# Patient Record
Sex: Female | Born: 1945 | Race: White | Hispanic: No | Smoking: Current every day smoker
Health system: Southern US, Community
[De-identification: ages and names within clinical notes are randomized; demographics above are authoritative.]

## PROBLEM LIST (undated history)

## (undated) DIAGNOSIS — R7303 Prediabetes: Secondary | ICD-10-CM

## (undated) DIAGNOSIS — I10 Essential (primary) hypertension: Secondary | ICD-10-CM

## (undated) DIAGNOSIS — R112 Nausea with vomiting, unspecified: Secondary | ICD-10-CM

## (undated) DIAGNOSIS — M810 Age-related osteoporosis without current pathological fracture: Secondary | ICD-10-CM

## (undated) DIAGNOSIS — E785 Hyperlipidemia, unspecified: Secondary | ICD-10-CM

## (undated) DIAGNOSIS — N6019 Diffuse cystic mastopathy of unspecified breast: Secondary | ICD-10-CM

## (undated) DIAGNOSIS — M1990 Unspecified osteoarthritis, unspecified site: Secondary | ICD-10-CM

## (undated) DIAGNOSIS — Z9889 Other specified postprocedural states: Secondary | ICD-10-CM

## (undated) DIAGNOSIS — IMO0001 Reserved for inherently not codable concepts without codable children: Secondary | ICD-10-CM

## (undated) DIAGNOSIS — K219 Gastro-esophageal reflux disease without esophagitis: Secondary | ICD-10-CM

## (undated) HISTORY — PX: TONSILLECTOMY: SUR1361

## (undated) HISTORY — PX: TUBAL LIGATION: SHX77

## (undated) HISTORY — PX: BREAST CYST EXCISION: SHX579

## (undated) HISTORY — PX: ABDOMINAL HYSTERECTOMY: SHX81

## (undated) HISTORY — PX: BREAST SURGERY: SHX581

## (undated) HISTORY — PX: DIAGNOSTIC LAPAROSCOPY: SUR761

## (undated) HISTORY — PX: FOOT GANGLION EXCISION: SHX1660

---

## 2008-11-18 ENCOUNTER — Emergency Department (HOSPITAL_COMMUNITY): Admission: EM | Admit: 2008-11-18 | Discharge: 2008-11-18 | Payer: Self-pay | Admitting: Emergency Medicine

## 2009-03-27 ENCOUNTER — Ambulatory Visit (HOSPITAL_COMMUNITY): Admission: RE | Admit: 2009-03-27 | Discharge: 2009-03-27 | Payer: Self-pay | Admitting: Family Medicine

## 2009-09-01 ENCOUNTER — Emergency Department (HOSPITAL_COMMUNITY): Admission: EM | Admit: 2009-09-01 | Discharge: 2009-09-01 | Payer: Self-pay | Admitting: Emergency Medicine

## 2010-01-16 ENCOUNTER — Ambulatory Visit (HOSPITAL_COMMUNITY): Admission: RE | Admit: 2010-01-16 | Discharge: 2010-01-16 | Payer: Self-pay | Admitting: Family Medicine

## 2010-03-17 HISTORY — PX: COLONOSCOPY: SHX174

## 2010-06-02 LAB — DIFFERENTIAL
Basophils Relative: 0 % (ref 0–1)
Eosinophils Absolute: 0.3 10*3/uL (ref 0.0–0.7)
Eosinophils Relative: 3 % (ref 0–5)
Monocytes Absolute: 0.6 10*3/uL (ref 0.1–1.0)
Monocytes Relative: 6 % (ref 3–12)

## 2010-06-02 LAB — BASIC METABOLIC PANEL
CO2: 29 mEq/L (ref 19–32)
Chloride: 103 mEq/L (ref 96–112)
GFR calc Af Amer: >60 mL/min (ref >60)
Glucose, Bld: 105 mg/dL — ABNORMAL HIGH (ref 70–99)
Sodium: 139 mEq/L (ref 135–145)

## 2010-06-02 LAB — CBC
HCT: 38.6 % (ref 36.0–46.0)
Hemoglobin: 13 g/dL (ref 12.0–15.0)
MCHC: 33.8 g/dL (ref 30.0–36.0)
MCV: 80.8 fL (ref 78.0–100.0)
RBC: 4.78 MIL/uL (ref 3.87–5.11)

## 2010-06-21 LAB — BASIC METABOLIC PANEL
BUN: 18 mg/dL (ref 6–23)
CO2: 31 mEq/L (ref 19–32)
Calcium: 9.4 mg/dL (ref 8.4–10.5)
Creatinine, Ser: 0.55 mg/dL (ref 0.4–1.2)
Glucose, Bld: 104 mg/dL — ABNORMAL HIGH (ref 70–99)
Sodium: 141 mEq/L (ref 135–145)

## 2010-06-21 LAB — STREP A DNA PROBE: Group A Strep Probe: NEGATIVE

## 2010-06-27 ENCOUNTER — Telehealth: Payer: Self-pay

## 2010-06-27 NOTE — Telephone Encounter (Signed)
Gastroenterology Pre-Procedure Form  Request Date: 06/18/2010    Requesting Physician: Sudie Bailey     PATIENT INFORMATION:  Leah Lynch is a 65 y.o., female (DOB=28-Jan-1946).  PROCEDURE: Procedure(s) requested: colonoscopy Procedure Reason: screening for colon cancer  PATIENT REVIEW QUESTIONS: The patient reports the following:   1. Diabetes Melitis: no 2. Joint replacements in the past 12 months: no 3. Major health problems in the past 3 months: no 4. Has an artificial valve or MVP:no 5. Has been advised in past to take antibiotics in advance of a procedure like teeth cleaning: no}    MEDICATIONS & ALLERGIES:    Patient reports the following regarding taking any blood thinners:   Plavix? no Aspirin?no Coumadin?  no  Patient confirms/reports the following medications:  Current Outpatient Prescriptions  Medication Sig Dispense Refill  . atenolol-chlorthalidone (TENORETIC) 50-25 MG per tablet Take 1 tablet by mouth daily.        [name] ibuprofen (ADVIL,MOTRIN) 800 MG tablet Take 800 mg by mouth 1 dose over 46 hours.        . potassium & sodium phosphates (PHOS-NAK) 280-160-250 MG PACK Take 1 packet by mouth 4 (four) times daily -  with meals and at bedtime. Pt says she takes potassium gluconate 550 daily.       . pravastatin (PRAVACHOL) 20 MG tablet Take 20 mg by mouth daily.        [name] pyridOXINE (VITAMIN B-6) 100 MG tablet Take 100 mg by mouth daily.          Patient confirms/reports the following allergies:  Allergies  Allergen Reactions  . Penicillins Hives and Itching    Patient is appropriate to schedule for requested procedure(s): yes  AUTHORIZATION INFORMATION Primary Insurance: Medicare  ID #: [id number] Pre-Cert / Berkley Harvey required: Pre-Cert / Auth #  Secondary Insurance: Medicaid   ID #: [id number]  Pre-Cert / Berkley Harvey required Pre-Cert / Auth   Orders Placed This Encounter  Procedures  . Endoscopy, colon, diagnostic    Standing Status: Future     Number of  Occurrences:      Standing Expiration Date: 06/27/2011    Order Specific Question:  Pre-op diagnosis    Answer:  Screening colonoscopy    Order Specific Question:  Pre-op visit required?    Answer:  No [0]    SCHEDULE INFORMATION: Procedure has been scheduled as follows:  Date: 07/17/2010   Time: 7:30 AM  Location: [facility] Short Stay  This Gastroenterology Pre-Precedure Form is being routed to the following provider(s) for review:  Lorenza Burton, NP

## 2010-06-28 NOTE — Telephone Encounter (Signed)
Letter has been sent

## 2010-06-28 NOTE — Telephone Encounter (Signed)
Proceed w/ colonoscopy

## 2010-07-17 ENCOUNTER — Other Ambulatory Visit: Payer: Self-pay | Admitting: Internal Medicine

## 2010-07-17 ENCOUNTER — Ambulatory Visit (HOSPITAL_COMMUNITY)
Admission: RE | Admit: 2010-07-17 | Discharge: 2010-07-17 | Disposition: A | Discharge status: Home / Self Care | Payer: Medicare Other | Source: Ambulatory Visit | Attending: Internal Medicine | Admitting: Internal Medicine

## 2010-07-17 ENCOUNTER — Encounter: Payer: Self-pay | Admitting: Internal Medicine

## 2010-07-17 DIAGNOSIS — Z79899 Other long term (current) drug therapy: Secondary | ICD-10-CM | POA: Insufficient documentation

## 2010-07-17 DIAGNOSIS — K648 Other hemorrhoids: Secondary | ICD-10-CM

## 2010-07-17 DIAGNOSIS — Z1211 Encounter for screening for malignant neoplasm of colon: Secondary | ICD-10-CM | POA: Insufficient documentation

## 2010-07-17 DIAGNOSIS — D129 Benign neoplasm of anus and anal canal: Secondary | ICD-10-CM | POA: Insufficient documentation

## 2010-07-17 DIAGNOSIS — D126 Benign neoplasm of colon, unspecified: Secondary | ICD-10-CM | POA: Insufficient documentation

## 2010-07-17 DIAGNOSIS — D128 Benign neoplasm of rectum: Secondary | ICD-10-CM

## 2010-07-17 DIAGNOSIS — I10 Essential (primary) hypertension: Secondary | ICD-10-CM | POA: Insufficient documentation

## 2010-07-18 NOTE — Op Note (Signed)
  NAME:  Leah Lynch, Leah Lynch           ACCOUNT NO.:  [id number]  MEDICAL RECORD NO.:  [id number]           PATIENT TYPE:  O  LOCATION:  [facility]                          FACILITY:  [facility]  PHYSICIAN:  R. Roetta Sessions, M.D. DATE OF BIRTH:  09/14/1945  DATE OF PROCEDURE:  07/17/2010 DATE OF DISCHARGE:                              OPERATIVE REPORT   PROCEDURE:  Colonoscopy with biopsy.  INDICATIONS FOR PROCEDURE:  A 65 year old lady, sent over at courtesy of Dr. John Giovanni, for her first ever colonoscopy.  She has no lower GI tract symptoms.  There is no family history of polyps or colon cancer.  Colonoscopy is now being done as standard screening maneuver. Risks, benefits, limitations, alternatives, imponderables have been discussed, questions answered.  Please see the documentation in the medical record.  PROCEDURE NOTE:  O2 saturation, blood pressure, pulse, respirations were monitored throughout the entire procedure.  CONSCIOUS SEDATION:  Versed 3 mg IV, Demerol 50 mg IV in divided doses.  INSTRUMENT:  Pentax video chip system.  FINDINGS:  Digital rectal exam revealed no abnormalities.  Endoscopic findings:  Prep was adequate.  Colon:  Colonic mucosa was surveyed from the rectosigmoid junction through the left transverse right colon to the appendiceal orifice, ileocecal valve/cecum.  These structures were well seen and photographed for the record.  From this level, scope was slowly and cautiously withdrawn.  All previously mentioned mucosal surfaces were again seen.  The patient was noted have a couple of diminutive polyps in the mid descending colon which were cold biopsied/removed. Remainder of colonic mucosa appeared normal.  Scope was pulled down to the rectum where a thorough examination of rectal mucosa including retroflexed view of the anal verge demonstrated a couple of diminutive hyperplastic-appearing polyps at 10 cm of anal verge which were also cold biopsied and  removed.  There were also internal hemorrhoids, but no other abnormalities.  The patient tolerated the procedure well.  Cecal withdrawal time 10 minutes.  IMPRESSION: 1. Internal hemorrhoids. 2. Diminutive rectal polyp status post cold biopsy removal. 3. Descending colon polyp status post cold biopsy removal.  Remainder     of colonic mucosa appeared normal.  RECOMMENDATIONS: 1. Follow up on path. 2. Further recommendations to follow.     Jonathon Bellows, M.D.     RMR/MEDQ  D:  07/17/2010  T:  07/17/2010  Job:  [id number]  cc:   Mila Homer. Sudie Bailey, M.D. Fax: [phone]  Electronically Signed by Lorrin Goodell M.D. on 07/18/2010 03:49:00 PM

## 2010-07-19 ENCOUNTER — Encounter: Payer: Self-pay | Admitting: Internal Medicine

## 2010-11-11 ENCOUNTER — Emergency Department (HOSPITAL_COMMUNITY): Payer: Medicare Other

## 2010-11-11 ENCOUNTER — Encounter: Payer: Self-pay | Admitting: Emergency Medicine

## 2010-11-11 ENCOUNTER — Emergency Department (HOSPITAL_COMMUNITY)
Admission: EM | Admit: 2010-11-11 | Discharge: 2010-11-11 | Disposition: A | Discharge status: Home / Self Care | Payer: Medicare Other | Attending: Emergency Medicine | Admitting: Emergency Medicine

## 2010-11-11 DIAGNOSIS — E785 Hyperlipidemia, unspecified: Secondary | ICD-10-CM | POA: Insufficient documentation

## 2010-11-11 DIAGNOSIS — I10 Essential (primary) hypertension: Secondary | ICD-10-CM | POA: Insufficient documentation

## 2010-11-11 DIAGNOSIS — R062 Wheezing: Secondary | ICD-10-CM | POA: Insufficient documentation

## 2010-11-11 DIAGNOSIS — J069 Acute upper respiratory infection, unspecified: Secondary | ICD-10-CM | POA: Insufficient documentation

## 2010-11-11 DIAGNOSIS — Z79899 Other long term (current) drug therapy: Secondary | ICD-10-CM | POA: Insufficient documentation

## 2010-11-11 DIAGNOSIS — R509 Fever, unspecified: Secondary | ICD-10-CM | POA: Insufficient documentation

## 2010-11-11 DIAGNOSIS — F17200 Nicotine dependence, unspecified, uncomplicated: Secondary | ICD-10-CM | POA: Insufficient documentation

## 2010-11-11 HISTORY — DX: Hyperlipidemia, unspecified: E78.5

## 2010-11-11 HISTORY — DX: Essential (primary) hypertension: I10

## 2010-11-11 MED ORDER — ALBUTEROL SULFATE HFA 108 (90 BASE) MCG/ACT IN AERS: 1.0000 | INHALATION_SPRAY | Freq: Four times a day (QID) | RESPIRATORY_TRACT | Status: DC | PRN

## 2010-11-11 MED ORDER — ALBUTEROL SULFATE (5 MG/ML) 0.5% IN NEBU
2.5000 mg | INHALATION_SOLUTION | Freq: Once | RESPIRATORY_TRACT | Status: AC
  Administered 2010-11-11: 2.5 mg via RESPIRATORY_TRACT
  Filled 2010-11-11: qty 0.5

## 2010-11-11 MED ORDER — IPRATROPIUM BROMIDE 0.02 % IN SOLN
0.5000 mg | Freq: Once | RESPIRATORY_TRACT | Status: AC
  Administered 2010-11-11: 0.5 mg via RESPIRATORY_TRACT
  Filled 2010-11-11: qty 2.5

## 2010-11-11 NOTE — ED Notes (Signed)
Pt c/o coughing, wheezing, and sob since Saturday.

## 2010-11-11 NOTE — ED Notes (Signed)
PA in with pt 

## 2010-11-11 NOTE — ED Provider Notes (Signed)
History   Chart scribed for Leah Jakes, MD by Enos Fling; the patient was seen in room [facility]09/[facility]09; this patient's care was started at 1:00 PM.    CSN: [id number] Arrival date & time: 11/11/2010 11:22 AM  Chief Complaint  Patient presents with  . Cough  . Shortness of Breath  . Wheezing   HPI TEMPESTT SILBA is a 65 y.o. female who presents to the Emergency Department complaining of cough. Pt reports cough, sometimes productive of white sputum, with associated wheezing and subjective fever, onset 3 days ago and worsening since. Runny nose onset last night. Pt is smoker, 1 ppd. No h/o COPD or Emphysema. No chest pain, n/v, diaphoresis, ha, or abd pain.  PCP Dr. Sudie Bailey  Past Medical History  Diagnosis Date  . Hypertension   . Hyperlipidemia     History reviewed. No pertinent past surgical history.  History reviewed. No pertinent family history.  History  Substance Use Topics  . Smoking status: Current Everyday Smoker -- 1.0 packs/day    Types: Cigarettes  . Smokeless tobacco: Not on file  . Alcohol Use: No    OB History    Grav Para Term Preterm Abortions TAB SAB Ect Mult Living                 Previous Medications   ATENOLOL-CHLORTHALIDONE (TENORETIC) 50-25 MG PER TABLET    Take 1 tablet by mouth daily.     IBUPROFEN (ADVIL,MOTRIN) 800 MG TABLET    Take 800 mg by mouth 3 (three) times daily as needed. For pain   POTASSIUM & SODIUM PHOSPHATES (PHOS-NAK) 280-160-250 MG PACK    Take 1 packet by mouth 4 (four) times daily -  with meals and at bedtime. Pt says she takes potassium gluconate 550 daily.   POTASSIUM CHLORIDE (KLOR-CON) 10 MEQ CR TABLET    Take 10 mEq by mouth daily.     PRAVASTATIN (PRAVACHOL) 20 MG TABLET    Take 20 mg by mouth daily.     PRAVASTATIN (PRAVACHOL) 40 MG TABLET    Take 40 mg by mouth daily.     PYRIDOXINE (VITAMIN B-6) 100 MG TABLET    Take 100 mg by mouth daily.       Allergies as of 11/11/2010 - Review Complete 11/11/2010    Allergen Reaction Noted  . Penicillins Hives and Itching 06/27/2010  . Bee venom Swelling 11/11/2010  . Sulfa antibiotics Itching 11/11/2010      Review of Systems  Constitutional: Positive for fever. Negative for chills.  HENT: Positive for congestion and rhinorrhea.   Eyes: Negative for redness.  Respiratory: Positive for cough and wheezing. Negative for shortness of breath.   Cardiovascular: Negative for chest pain and leg swelling.  Gastrointestinal: Negative for nausea, vomiting, abdominal pain and diarrhea.  Genitourinary: Negative for flank pain.  Musculoskeletal: Negative for back pain.  Skin: Negative for rash.  Neurological: Negative for headaches.    Physical Exam  BP 111/61  Pulse 73  Temp(Src) 98.5 F (36.9 C) (Oral)  Resp 18  Ht 4\' 11"  (1.499 m)  Wt 151 lb 3 oz (68.578 kg)  BMI 30.54 kg/m2  SpO2 93%  Physical Exam  Nursing note and vitals reviewed. Constitutional: She is oriented to person, place, and time. She appears well-developed and well-nourished. No distress.  HENT:  Head: Normocephalic and atraumatic.  Mouth/Throat: Oropharynx is clear and moist.  Eyes: Conjunctivae and EOM are normal. Pupils are equal, round, and reactive to light.  Neck:  Normal range of motion. Neck supple.  Cardiovascular: Normal rate, regular rhythm and normal heart sounds.   Pulmonary/Chest: Effort normal. She has wheezes (bilateral expiratory wheezing). She exhibits no tenderness.  Abdominal: Soft. Bowel sounds are normal. There is no tenderness.  Musculoskeletal: Normal range of motion. She exhibits no edema and no tenderness.  Lymphadenopathy:    She has no cervical adenopathy.  Neurological: She is alert and oriented to person, place, and time. She has normal strength. No cranial nerve deficit or sensory deficit.  Skin: Skin is warm and dry. No rash noted.  Psychiatric: She has a normal mood and affect.    ED Course  Procedures  OTHER DATA REVIEWED: Nursing notes  and vital signs reviewed. Prior records reviewed.   LABS / RADIOLOGY:  Dg Chest 2 View  11/11/2010  *RADIOLOGY REPORT*  Clinical Data: Cough, shortness of breath  CHEST - 2 VIEW  Comparison: None  Findings: Upper-normal size of cardiac silhouette. Mediastinal contours and pulmonary vascularity normal. Atherosclerotic calcification aorta. Minimal peribronchial thickening. No pulmonary infiltrate, pleural effusion or pneumothorax. Bones appear demineralized.  IMPRESSION: Minimal bronchitic changes.  Original Report Authenticated By: Lollie Marrow, M.D.      ED COURSE: 2:22 PM - Pt recheck, feeling a little better after neb treatment; still with slight wheezing but improved. Discussed discharge, pt agrees.   MDM: REACTIVE AIRWAY WITH WHEEZING AND URI AND POSSIBLY EARLY COPD. ALL WHEEZING RESOLVED IN ED. AND FEELS BETTER. CXR NEG FOR PENUMONIA.   IMPRESSION: 1. Upper respiratory infection   2. Wheezing      PLAN: discharge All results reviewed and discussed with pt, questions answered, pt agreeable with plan.   CONDITION ON DISCHARGE: stable   MEDS GIVEN IN ED: ipratropium (ATROVENT) 0.02 % nebulizer solution 0.5 mg (0.5 mg Nebulization Given 11/11/10 1346)  albuterol (PROVENTIL) (5 MG/ML) 0.5% nebulizer solution 2.5 mg (2.5 mg Nebulization Given 11/11/10 1346)     DISCHARGE MEDICATIONS: New Prescriptions   ALBUTEROL (PROVENTIL HFA;VENTOLIN HFA) 108 (90 BASE) MCG/ACT INHALER    Inhale 1-2 puffs into the lungs every 6 (six) hours as needed for wheezing.     SCRIBE ATTESTATION: I personally performed the services described in this documentation, which was scribed in my presence. The recorded information has been reviewed and considered. Leah Jakes, MD         Leah Jakes, MD 11/11/10 [phone]

## 2010-12-12 ENCOUNTER — Encounter (HOSPITAL_COMMUNITY)
Admission: RE | Admit: 2010-12-12 | Discharge: 2010-12-12 | Disposition: A | Discharge status: Home / Self Care | Payer: PRIVATE HEALTH INSURANCE | Source: Ambulatory Visit | Attending: Podiatry | Admitting: Podiatry

## 2010-12-12 ENCOUNTER — Encounter (HOSPITAL_COMMUNITY): Payer: Self-pay

## 2010-12-12 ENCOUNTER — Other Ambulatory Visit: Payer: Self-pay

## 2010-12-12 HISTORY — DX: Diffuse cystic mastopathy of unspecified breast: N60.19

## 2010-12-12 LAB — BASIC METABOLIC PANEL
BUN: 14 mg/dL (ref 6–23)
CO2: 32 mEq/L (ref 19–32)
Calcium: 9.4 mg/dL (ref 8.4–10.5)
GFR calc non Af Amer: >60 mL/min (ref >60)
Glucose, Bld: 128 mg/dL — ABNORMAL HIGH (ref 70–99)
Potassium: 3.1 mEq/L — ABNORMAL LOW (ref 3.5–5.1)

## 2010-12-12 LAB — CBC
HCT: 41.3 % (ref 36.0–46.0)
Hemoglobin: 13.6 g/dL (ref 12.0–15.0)
MCH: 28.1 pg (ref 26.0–34.0)
MCHC: 32.9 g/dL (ref 30.0–36.0)
RBC: 4.84 MIL/uL (ref 3.87–5.11)

## 2010-12-12 LAB — SURGICAL PCR SCREEN: Staphylococcus aureus: NEGATIVE

## 2010-12-12 NOTE — Patient Instructions (Signed)
[name]  12/12/2010   Your procedure is scheduled on: 12/19/10  Report to [facility] [place] AM.  Call this number if you have problems the morning of surgery: [phone]   Remember:   Do not eat food:After Midnight.  Do not drink clear liquids: After Midnight.  Take these medicines the morning of surgery with A SIP OF WATER: tentoretic, albuterol   Do not wear jewelry, make-up or nail polish.  Do not wear lotions, powders, or perfumes. You may wear deodorant.  Do not shave 48 hours prior to surgery.  Do not bring valuables to the hospital.  Contacts, dentures or bridgework may not be worn into surgery.  Leave suitcase in the car. After surgery it may be brought to your room.  For patients admitted to the hospital, checkout time is 11:00 AM the day of discharge.   Patients discharged the day of surgery will not be allowed to drive home.  Name and phone number of your driver: family  Special Instructions: CHG Shower Use Special Wash: 1/2 bottle night before surgery and 1/2 bottle morning of surgery.   Please read over the following fact sheets that you were given: Pain Booklet, MRSA Information, Surgical Site Infection Prevention, Anesthesia Post-op Instructions and Care and Recovery After Surgery   PATIENT INSTRUCTIONS POST-ANESTHESIA  IMMEDIATELY FOLLOWING SURGERY:  Do not drive or operate machinery for the first twenty four hours after surgery.  Do not make any important decisions for twenty four hours after surgery or while taking narcotic pain medications or sedatives.  If you develop intractable nausea and vomiting or a severe headache please notify your doctor immediately.  FOLLOW-UP:  Please make an appointment with your surgeon as instructed. You do not need to follow up with anesthesia unless specifically instructed to do so.  WOUND CARE INSTRUCTIONS (if applicable):  Keep a dry clean dressing on the anesthesia/puncture wound site if there is drainage.  Once the  wound has quit draining you may leave it open to air.  Generally you should leave the bandage intact for twenty four hours unless there is drainage.  If the epidural site drains for more than 36-48 hours please call the anesthesia department.  QUESTIONS?:  Please feel free to call your physician or the hospital operator if you have any questions, and they will be happy to assist you.     [facility] Anesthesia Department [address] [place] [phone]

## 2010-12-13 NOTE — Pre-Procedure Instructions (Signed)
Dr. Tollie Eth aware of low K+.  Pt. Is taking KCl supplement once daily.  Dr. Tollie Eth ordered pt. To increase supplement to twice a day starting on Sunday 12-15-10.  Pt. Called @ home & informed of situation.  Pt. Verbalized full understanding.  I-Stat will be done morning of procedure.

## 2010-12-18 NOTE — H&P (Signed)
Chief Complaint / History of Present Illness: This 65 year old female returns today for a consultation to discusses the proposed surgery.  She is scheduled for [facility] of the right foot with possible [name] osteotomy of the hallux of the right foot on 12/19/2010 at [facility].  Past Medical History:  Musculoskeletal Hx: (+) arthritis conditions.   Cardiovascular Hx: (+) hypertension, high cholesterol.   Medication History: Active: pravastatin (active), ibuprofen (active), atenolol (active), potassium (active).   Allergies: Patient/Guardian admits allergies to penicillin resulting in itching.   Past Surgical History: Patient/Guardian admits past surgical history of tubal ligation, hysterectomy, cyst removed from right foot, cyst removed from breast.   Social History: Patient/Guardian admits tobacco use. She smokes 1 pack per day, Patient/Guardian denies alcohol use.   Family History: Patient/Guardian admits a family history of stroke associated with father, mother.   Review of Systems: No abnormal findings with the exception of the chief complaint.    Physical Exam: The patient is a pleasant, 65 year old female in no apparent distress.  She is oriented to person, place and time.  She is ambulatory.  Skin is warm, dry and supple.  All nails are well trimmed and free of pathology.  Pedal hair growth and distribution is normal.  No hyperkeratotic lesions are present.  No ulceration are present.  Dorsalis pedis and posterior tibial pulses measure 2/4.  Capillary refill time is immediate.  No pedal edema is present.  Muscle tone is normal.  Muscle strength is 5/5 with regards to dorsiflexion, plantarflexion, inversion and eversion.   There is mild dorsal contraction of the 2nd digit of both feet.  There is lateral deviation of the hallux with a dorsomedial prominence of the first metatarsal head of both feet.  Light touch sensation is grossly intact.    Test Results:  None to report at  this time.    Impression:  Hallux valgus.  Plan:   The podiatric pathology and treatment options were again reviewed with the her.  I discussed with the her the surgical procedure itself, the indications, the risks, possible complications, post-operative course and alternative treatments. I gave no guarantees regarding the outcome. She would like to proceed with the proposed surgery.  An informed consent was obtained.  She is to return for her post-operative appointment or sooner if problems arise.    Medication Instructions: I advised her to discontinue taking ibuprofen until after surgery.  Dispensed: A prescription for a walker was dispensed.   Rx: Phenergan- 12.5 mg tablet, Take 1 tablet by mouth every four to six hours as needed. Max daily dose: 6.  Dispense: 21 tablet. Refills: 0.  Allow Generic: Yes Rx: Percocet- 325 mg-10mg  tablet, Take 1 tablet by mouth every four to six hours as needed for pain. Max daily dose: 6.  Dispense: 42 tablet. Refills: 0.  Allow Generic: Yes  cc: Primary Care Physician:  John Giovanni, MD

## 2010-12-19 ENCOUNTER — Ambulatory Visit (HOSPITAL_COMMUNITY): Payer: PRIVATE HEALTH INSURANCE | Admitting: Anesthesiology

## 2010-12-19 ENCOUNTER — Encounter (HOSPITAL_COMMUNITY)
Admission: RE | Disposition: A | Discharge status: Home / Self Care | Payer: Self-pay | Source: Ambulatory Visit | Attending: Podiatry

## 2010-12-19 ENCOUNTER — Ambulatory Visit (HOSPITAL_COMMUNITY)
Admission: RE | Admit: 2010-12-19 | Discharge: 2010-12-19 | Disposition: A | Discharge status: Home / Self Care | Payer: PRIVATE HEALTH INSURANCE | Source: Ambulatory Visit | Attending: Podiatry | Admitting: Podiatry

## 2010-12-19 ENCOUNTER — Encounter (HOSPITAL_COMMUNITY): Payer: Self-pay | Admitting: Anesthesiology

## 2010-12-19 ENCOUNTER — Ambulatory Visit (HOSPITAL_COMMUNITY): Payer: PRIVATE HEALTH INSURANCE

## 2010-12-19 DIAGNOSIS — M2010 Hallux valgus (acquired), unspecified foot: Secondary | ICD-10-CM | POA: Insufficient documentation

## 2010-12-19 DIAGNOSIS — Z01810 Encounter for preprocedural cardiovascular examination: Secondary | ICD-10-CM | POA: Insufficient documentation

## 2010-12-19 DIAGNOSIS — Z79899 Other long term (current) drug therapy: Secondary | ICD-10-CM | POA: Insufficient documentation

## 2010-12-19 DIAGNOSIS — M21619 Bunion of unspecified foot: Secondary | ICD-10-CM | POA: Insufficient documentation

## 2010-12-19 DIAGNOSIS — I10 Essential (primary) hypertension: Secondary | ICD-10-CM | POA: Insufficient documentation

## 2010-12-19 DIAGNOSIS — E785 Hyperlipidemia, unspecified: Secondary | ICD-10-CM | POA: Insufficient documentation

## 2010-12-19 DIAGNOSIS — Z01812 Encounter for preprocedural laboratory examination: Secondary | ICD-10-CM | POA: Insufficient documentation

## 2010-12-19 HISTORY — PX: BUNIONECTOMY: SHX129

## 2010-12-19 LAB — POCT I-STAT 4, (NA,K, GLUC, HGB,HCT)
Glucose, Bld: 97 mg/dL (ref 70–99)
HCT: 46 % (ref 36.0–46.0)
Hemoglobin: 15.6 g/dL — ABNORMAL HIGH (ref 12.0–15.0)
Sodium: 141 mEq/L (ref 135–145)

## 2010-12-19 SURGERY — BUNIONECTOMY
Anesthesia: Monitor Anesthesia Care | Site: Foot | Laterality: Right | Wound class: Clean

## 2010-12-19 MED ORDER — LIDOCAINE HCL (PF) 1 % IJ SOLN
INTRAMUSCULAR | Status: AC
  Filled 2010-12-19: qty 5

## 2010-12-19 MED ORDER — FENTANYL CITRATE 0.05 MG/ML IJ SOLN
INTRAMUSCULAR | Status: DC | PRN
  Administered 2010-12-19 (×4): 50 ug via INTRAVENOUS

## 2010-12-19 MED ORDER — SODIUM CHLORIDE 0.9 % IR SOLN
Status: DC | PRN
  Administered 2010-12-19: 1000 mL

## 2010-12-19 MED ORDER — CLINDAMYCIN PHOSPHATE 600 MG/50ML IV SOLN
INTRAVENOUS | Status: AC
  Administered 2010-12-19: 600 mg via INTRAVENOUS
  Filled 2010-12-19: qty 50

## 2010-12-19 MED ORDER — CLINDAMYCIN PHOSPHATE 600 MG/50ML IV SOLN: 600.0000 mg | Freq: Once | INTRAVENOUS | Status: DC

## 2010-12-19 MED ORDER — BUPIVACAINE HCL (PF) 0.5 % IJ SOLN
INTRAMUSCULAR | Status: DC | PRN
Start: 2010-12-19 — End: 2010-12-19
  Administered 2010-12-19: 17 mL

## 2010-12-19 MED ORDER — PROPOFOL 10 MG/ML IV EMUL
INTRAVENOUS | Status: AC
  Filled 2010-12-19: qty 20

## 2010-12-19 MED ORDER — FENTANYL CITRATE 0.05 MG/ML IJ SOLN
INTRAMUSCULAR | Status: AC
  Filled 2010-12-19: qty 2

## 2010-12-19 MED ORDER — PROPOFOL 10 MG/ML IV EMUL
INTRAVENOUS | Status: DC | PRN
  Administered 2010-12-19: 35 ug/kg/min via INTRAVENOUS

## 2010-12-19 MED ORDER — LACTATED RINGERS IV SOLN
INTRAVENOUS | Status: DC
  Administered 2010-12-19: 1000 mL via INTRAVENOUS

## 2010-12-19 MED ORDER — BUPIVACAINE HCL (PF) 0.5 % IJ SOLN
INTRAMUSCULAR | Status: AC
  Filled 2010-12-19: qty 30

## 2010-12-19 MED ORDER — MIDAZOLAM HCL 2 MG/2ML IJ SOLN
1.0000 mg | INTRAMUSCULAR | Status: DC | PRN
  Administered 2010-12-19: 2 mg via INTRAVENOUS

## 2010-12-19 MED ORDER — MIDAZOLAM HCL 2 MG/2ML IJ SOLN
INTRAMUSCULAR | Status: AC
  Administered 2010-12-19: 2 mg via INTRAVENOUS
  Filled 2010-12-19: qty 2

## 2010-12-19 MED ORDER — MIDAZOLAM HCL 5 MG/5ML IJ SOLN
INTRAMUSCULAR | Status: DC | PRN
  Administered 2010-12-19: 2 mg via INTRAVENOUS

## 2010-12-19 MED ORDER — MIDAZOLAM HCL 2 MG/2ML IJ SOLN
INTRAMUSCULAR | Status: AC
  Filled 2010-12-19: qty 2

## 2010-12-19 SURGICAL SUPPLY — 50 items
BAG HAMPER (MISCELLANEOUS) ×2 IMPLANT
BANDAGE ELASTIC 4 VELCRO NS (GAUZE/BANDAGES/DRESSINGS) ×2 IMPLANT
BANDAGE ESMARK 4X12 BL STRL LF (DISPOSABLE) ×1 IMPLANT
BANDAGE GAUZE ELAST BULKY 4 IN (GAUZE/BANDAGES/DRESSINGS) ×2 IMPLANT
BENZOIN TINCTURE PRP APPL 2/3 (GAUZE/BANDAGES/DRESSINGS) ×2 IMPLANT
BIT DRILL 2.0 HCS 150 (BIT) ×2 IMPLANT
BLADE AVERAGE 25X9 (BLADE) ×2 IMPLANT
BLADE SURG 15 STRL LF DISP TIS (BLADE) ×2 IMPLANT
BLADE SURG 15 STRL SS (BLADE) ×2
BNDG ESMARK 4X12 BLUE STRL LF (DISPOSABLE) ×2
CHLORAPREP W/TINT 26ML (MISCELLANEOUS) ×2 IMPLANT
CLOTH BEACON ORANGE TIMEOUT ST (SAFETY) ×2 IMPLANT
COVER LIGHT HANDLE STERIS (MISCELLANEOUS) ×4 IMPLANT
CUFF TOURNIQUET SINGLE 18IN (TOURNIQUET CUFF) ×2 IMPLANT
DECANTER SPIKE VIAL GLASS SM (MISCELLANEOUS) IMPLANT
DRAPE OEC MINIVIEW 54X84 (DRAPES) ×2 IMPLANT
DRSG ADAPTIC 3X8 NADH LF (GAUZE/BANDAGES/DRESSINGS) ×2 IMPLANT
DURA STEPPER LG (CAST SUPPLIES) IMPLANT
DURA STEPPER MED (CAST SUPPLIES) ×2 IMPLANT
DURA STEPPER SML (CAST SUPPLIES) IMPLANT
DURA STEPPER XL (SOFTGOODS) IMPLANT
ELECT REM PT RETURN 9FT ADLT (ELECTROSURGICAL) ×2
ELECTRODE REM PT RTRN 9FT ADLT (ELECTROSURGICAL) ×1 IMPLANT
GLOVE BIO SURGEON STRL SZ7.5 (GLOVE) ×2 IMPLANT
GLOVE ECLIPSE 7.0 STRL STRAW (GLOVE) ×2 IMPLANT
GLOVE ECLIPSE 8.0 STRL XLNG CF (GLOVE) ×2 IMPLANT
GLOVE EXAM NITRILE MD LF STRL (GLOVE) ×2 IMPLANT
GLOVE INDICATOR 7.5 STRL GRN (GLOVE) ×2 IMPLANT
GLOVE INDICATOR 8.5 STRL (GLOVE) ×2 IMPLANT
GOWN BRE IMP SLV AUR XL STRL (GOWN DISPOSABLE) ×4 IMPLANT
GOWN STRL REIN 3XL LVL4 (GOWN DISPOSABLE) ×2 IMPLANT
GUIDEWIRE THREADED 1.1X150MM (WIRE) ×6 IMPLANT
KIT ROOM TURNOVER AP CYSTO (KITS) ×2 IMPLANT
MANIFOLD NEPTUNE II (INSTRUMENTS) ×2 IMPLANT
NEEDLE HYPO 27GX1-1/4 (NEEDLE) ×6 IMPLANT
NS IRRIG 1000ML POUR BTL (IV SOLUTION) ×2 IMPLANT
PACK BASIC LIMB (CUSTOM PROCEDURE TRAY) ×2 IMPLANT
PAD ARMBOARD 7.5X6 YLW CONV (MISCELLANEOUS) ×2 IMPLANT
PAIN PUMP ON-Q 100MLX2ML 2.5IN (PAIN MANAGEMENT) IMPLANT
RASP SM TEAR CROSS CUT (RASP) IMPLANT
SCREW CANN COMP 3.0X20 (Screw) ×2 IMPLANT
SET BASIN LINEN APH (SET/KITS/TRAYS/PACK) ×2 IMPLANT
SPONGE GAUZE 4X4 12PLY (GAUZE/BANDAGES/DRESSINGS) ×2 IMPLANT
STRIP CLOSURE SKIN 1/2X4 (GAUZE/BANDAGES/DRESSINGS) ×2 IMPLANT
SUT MON AB 5-0 PS2 18 (SUTURE) ×2 IMPLANT
SUT VIC AB 2-0 CT2 27 (SUTURE) ×2 IMPLANT
SUT VIC AB 4-0 PS2 27 (SUTURE) ×2 IMPLANT
SUT VICRYL AB 3-0 FS1 BRD 27IN (SUTURE) ×2 IMPLANT
SYR BULB IRRIGATION 50ML (SYRINGE) ×2 IMPLANT
SYR CONTROL 10ML LL (SYRINGE) ×4 IMPLANT

## 2010-12-19 NOTE — Transfer of Care (Signed)
Immediate Anesthesia Transfer of Care Note  Patient: Leah Lynch  Procedure(s) Performed:  Arbutus Leas Legrand Como Bunionectomy  Patient Location: PACU and Short Stay  Anesthesia Type: MAC  Level of Consciousness: awake and alert   Airway & Oxygen Therapy: Patient Spontanous Breathing  Post-op Assessment: Report given to PACU RN and Post -op Vital signs reviewed and stable  Post vital signs: Reviewed  Complications: No apparent anesthesia complications

## 2010-12-19 NOTE — Op Note (Signed)
OPERATIVE REPORT  SURGEON:   Dallas Schimke, [place]  OR STAFF:   Cyndie Chime, RN - Circulator Donald Pore - Scrub Person Carver Fila - RN First Assistant   PREOPERATIVE DIAGNOSIS:   Hallux valgus right foot  POSTOPERATIVE DIAGNOSIS: Hallux valgus right foot  PROCEDURE: 1. [name] bunionectomy of the right foot. 2. [name] osteotomy of the hallux of the right foot.  ANESTHESIA:  Monitor Anesthesia Care   HEMOSTASIS:   Total Tourniquet Time Documented: area (Right) - 69 minutes; Tourniquet set at 250 mmHg  ESTIMATED BLOOD LOSS:   Minimal (< 5cc)  MATERIALS USED:  3.0 mm Synthes Cannulated Screw x 2.  INJECTABLES: 0.5% Marcaine plain  PATHOLOGY:   None.  COMPLICATIONS:   None.  INDICATIONS:  Painful bunion deformity of the right foot.  DESCRIPTION OF THE PROCEDURE:  The patient was brought to the operating room and placed on the operative table in the supine position.  A pneumatic ankle tourniquet was applied to the patient's ankle.  Following sedation, the surgical site was anesthetized with 0.5% Marcaine plain.  The foot was then prepped, scrubbed, and draped in the usual sterile technique.  The foot was elevated, exsanguinated and the pneumatic ankle tourniquet inflated to 250 mmHg.    Attention was then directed to the dorsomedial aspect of the first metatarsal phalangeal joint where a 6-cm linear incision was made medial and parallel to the course of the extensor hallucis longus tendon.  The incision was deepened through subcutaneous tissues.  Vital neurovascular structures were identified and retracted.  All bleeders were identified and cauterized.  The incision was deepened to the level of the first metatarsal phalangeal joint capsule.  An inverted L-type capsulotomy was performed over the dorsal aspect of the first metatarsal phalangeal joint.  The capsular and periosteal structures were dissected free of their osseous attachments and  reflected medially and laterally thus exposing the head of the first metatarsal at the operative site.  Utilizing a sagittal saw, the medial prominence was resected and passed from the operative field.  All rough edges were smoothed.    Attention was directed to the first interspace via the original skin incision.  Using sharp and blunt dissection, the fibular sesamoid was freed of its soft tissue attachments proximally, laterally and distally.  The conjoined tendon of the adductor hallucis muscle was identified and transected at its attachment to the base of the proximal phalanx of the hallux.  The lateral contracture present on the hallux was noted to be reduced and the sesamoid apparatus was noted to float into a more corrected medial position.    Attention was redirected to the medial aspect of the first metatarsal head where a through and through chevron osteotomy was created in the metaphyseal region of the first metatarsal bone using a sagittal bone saw.  The apex of the osteotomy pointed distally.  Upon completion of the osteotomy the capital fragment was distracted and shifted laterally into a more corrected position.  A k-wire from the 3.0 mm Synthes cannulated screw set was inserted across the osteotomy site from a dorsal proximal to plantar distal direction.  A 3.0 mm Synthes cannulated screw was inserted across the k-wire.  Adequate compression was noted.  The position of the screw was confirmed with fluoroscopy and noted to be in acceptable alignment.  The remaining medial bone shelf was resected utilizing a sagittal bone saw and passed from the operative field.    Attention was directed to the proximal  phalanx of the hallux.  A wedge shaped osteotomy was performed in the proximal phalanx.  The apex was oriented laterally and the base was oriented medially.  The wedge of bone was removed.  The lateral cortex was feathered with the sagittal saw to allow for adequate closure of the osteotomy.  A  k-wire from the 3.0 mm Synthes cannulated screw set was inserted across the osteotomy site.  A 3.0 mm Synthes cannulated screw was inserted.  Adequate compression was noted.  The position of the screw was confirmed with fluoroscopy and noted to be in acceptable alignment.  The area was copiously irrigated.   A medial capsulorrhaphy was performed.  The remaining periosteal and capsular tissues were approximated with 2-0 and 3-0 Vicryl suture.  4-0 Vicryl was used to approximate the subcutaneous tissues. 5-0 Monocryl was used to approximate the skin in a subcuticular manner.  The skin closure was reinforced with Steri-Strips.  A sterile compressive dressing was applied to the operative foot.  The patient's ankle tourniquet was deflated.  A prompt hyperemic response was noted to all digits of the operative foot.   The patient tolerated the procedure well.  The patient was then transferred to Short Stay with vital signs stable and vascular status intact to all toes of the operative foot.  Following a period of postoperative monitoring, the patient will be discharged home.

## 2010-12-19 NOTE — Anesthesia Preprocedure Evaluation (Addendum)
Anesthesia Evaluation   Patient awake  General Assessment Comment  Reviewed: Allergy & Precautions, H&P , NPO status , Patient's Chart, lab work & pertinent test results  History of Anesthesia Complications Negative for: history of anesthetic complications  Airway Mallampati: II    Mouth opening: Limited Mouth Opening  Dental  (+) Teeth Intact   Pulmonary COPDRecent URI , Resolved    Pulmonary exam normal       Cardiovascular hypertension, Pt. on medications Regular Normal    Neuro/Psych    GI/Hepatic   Endo/Other    Renal/GU      Musculoskeletal   Abdominal   Peds  Hematology   Anesthesia Other Findings   Reproductive/Obstetrics                           Anesthesia Physical Anesthesia Plan  ASA: III  Anesthesia Plan: MAC   Post-op Pain Management:    Induction:   Airway Management Planned: Nasal Cannula  Additional Equipment:   Intra-op Plan:   Post-operative Plan:   Informed Consent: I have reviewed the patients History and Physical, chart, labs and discussed the procedure including the risks, benefits and alternatives for the proposed anesthesia with the patient or authorized representative who has indicated his/her understanding and acceptance.     Plan Discussed with:   Anesthesia Plan Comments:         Anesthesia Quick Evaluation

## 2010-12-19 NOTE — Anesthesia Postprocedure Evaluation (Signed)
  Anesthesia Post-op Note  Patient: Leah Lynch  Procedure(s) Performed:  Arbutus Leas Legrand Como Bunionectomy  Patient Location: PACU and Short Stay  Anesthesia Type: MAC  Level of Consciousness: awake and alert   Airway and Oxygen Therapy: Patient Spontanous Breathing  Post-op Pain: mild  Post-op Assessment: Post-op Vital signs reviewed and Patient's Cardiovascular Status Stable  Post-op Vital Signs: Reviewed  Complications: No apparent anesthesia complications

## 2010-12-19 NOTE — Interval H&P Note (Signed)
HISTORY AND PHYSICAL INTERVAL NOTE:  12/19/2010  7:31 AM  Leah Lynch  has presented today for surgery, with the diagnosis of hallux valgus right foot.  The various methods of treatment have been discussed with the patient.  No guarantees were given.  After consideration of risks, benefits and other options for treatment, the patient has consented to surgery.  I have reviewed the patients' chart and labs.    Patient Vitals for the past 24 hrs:  BP Temp Temp src Pulse Resp SpO2  12/19/10 0700 115/64 mmHg - - - 18  90 %  12/19/10 0645 118/70 mmHg - - - 21  93 %  12/19/10 0640 118/66 mmHg 98.4 F (36.9 C) Oral 82  21  93 %    A history and physical examination was performed in my office.  There have been no changes to this history and physical examination.  Dallas Schimke, DPM

## 2010-12-24 ENCOUNTER — Encounter (HOSPITAL_COMMUNITY): Payer: Self-pay | Admitting: Podiatry

## 2011-05-13 ENCOUNTER — Encounter (HOSPITAL_COMMUNITY): Payer: Self-pay | Admitting: Pharmacy Technician

## 2011-05-16 ENCOUNTER — Encounter (HOSPITAL_COMMUNITY)
Admission: RE | Admit: 2011-05-16 | Discharge: 2011-05-16 | Disposition: A | Discharge status: Home / Self Care | Payer: PRIVATE HEALTH INSURANCE | Source: Ambulatory Visit | Attending: Podiatry | Admitting: Podiatry

## 2011-05-16 ENCOUNTER — Encounter (HOSPITAL_COMMUNITY): Payer: Self-pay

## 2011-05-16 HISTORY — DX: Unspecified osteoarthritis, unspecified site: M19.90

## 2011-05-16 LAB — BASIC METABOLIC PANEL
BUN: 19 mg/dL (ref 6–23)
GFR calc Af Amer: >90 mL/min (ref >90)
GFR calc non Af Amer: >90 mL/min (ref >90)
Potassium: 3.7 mEq/L (ref 3.5–5.1)
Sodium: 142 mEq/L (ref 135–145)

## 2011-05-16 LAB — HEMOGLOBIN AND HEMATOCRIT, BLOOD
HCT: 42.2 % (ref 36.0–46.0)
Hemoglobin: 13.7 g/dL (ref 12.0–15.0)

## 2011-05-16 LAB — SURGICAL PCR SCREEN: Staphylococcus aureus: NEGATIVE

## 2011-05-16 MED ORDER — FENTANYL CITRATE 0.05 MG/ML IJ SOLN: 25.0000 ug | INTRAMUSCULAR | Status: DC | PRN

## 2011-05-16 MED ORDER — ONDANSETRON HCL 4 MG/2ML IJ SOLN: 4.0000 mg | Freq: Once | INTRAMUSCULAR | Status: DC | PRN

## 2011-05-16 NOTE — Patient Instructions (Signed)
[name]  05/16/2011   Your procedure is scheduled on:  05/22/11  Report to [facility] at 09:10 AM.  Call this number if you have problems the morning of surgery: [phone]   Remember:   Do not eat food:After Midnight.  May have clear liquids:until Midnight .  Clear liquids include soda, tea, black coffee, apple or grape juice, broth.  Take these medicines the morning of surgery with A SIP OF WATER: Tenoretic. Also, use your inhaler, Albuterol and bring it with you to the hospital.   Do not wear jewelry, make-up or nail polish.  Do not wear lotions, powders, or perfumes. You may wear deodorant.  Do not shave 48 hours prior to surgery.  Do not bring valuables to the hospital.  Contacts, dentures or bridgework may not be worn into surgery.  Leave suitcase in the car. After surgery it may be brought to your room.  For patients admitted to the hospital, checkout time is 11:00 AM the day of discharge.   Patients discharged the day of surgery will not be allowed to drive home.  Name and phone number of your driver:   Special Instructions: CHG Shower Use Special Wash: 1/2 bottle night before surgery and 1/2 bottle morning of surgery.   Please read over the following fact sheets that you were given: Pain Booklet, MRSA Information, Surgical Site Infection Prevention, Anesthesia Post-op Instructions and Care and Recovery After Surgery    Bunionectomy A bunionectomy is surgery to remove a bunion. A bunion is an enlargement of the joint at the base of the big toe. It is made up of bone and soft tissue on the inside part of the joint. Over time, a painful lump appears on the inside of the joint. The big toe begins to point inward toward the second toe. New bone growth can occur and a bone spur may form. The pain eventually causes difficulty walking. A bunion usually results from inflammation caused by the irritation of poorly fitting shoes. It often begins later in life. A bunionectomy is  performed when nonsurgical treatment no longer works. When surgery is needed, the extent of the procedure will depend on the degree of deformity of the foot. Your surgeon will discuss with you the different procedures and what will work best for you depending on your age and health. LET YOUR CAREGIVER KNOW ABOUT:   Previous problems with anesthetics or medicines used to numb the skin.   Allergies to dyes, iodine, foods, and/or latex.   Medicines taken including herbs, eye drops, prescription medicines (especially medicines used to "thin the blood"), aspirin and other over-the-counter medicines, and steroids (by mouth or as a cream).   History of bleeding or blood problems.   Possibility of pregnancy, if this applies.   History of blood clots in your legs and/or lungs .   Previous surgery.   Other important health problems.  RISKS AND COMPLICATIONS   Infection.   Pain.   Nerve damage.   Possibility that the bunion will recur.  BEFORE THE PROCEDURE  You should be present 60 minutes prior to your procedure or as directed.  PROCEDURE  Surgery is often done so that you can go home the same day (outpatient). It may be done in a hospital or in an outpatient surgical center. An anesthetic will be used to help you sleep during the procedure. Sometimes, a spinal anesthetic is used to make you numb below the waist. A cut (incision) is made over the swollen  area at the first joint of the big toe. The enlarged lump will be removed. If there is a need to reposition the bones of the big toe, this may require more than 1 incision. The bone itself may need to be cut. Screws and wires may be used in the repair. These can be removed at a later date. In severe cases, the entire joint may need to be removed and a joint replacement inserted. When done, the incision is closed with stitches (sutures). Skin adhesive strips may be added for reinforcement. They help hold the incision closed.  AFTER THE PROCEDURE   Compression bandages (dressings) are then wrapped around the wound. This helps to keep the foot in alignment and reduce swelling. Your foot will be monitored for bleeding and swelling. You will need to stay for a few hours in the recovery area before being discharged. This allows time for the anesthesia to wear off. You will be discharged home when you are awake, stable, and doing well. HOME CARE INSTRUCTIONS   You can expect to return to normal activities within 6 to 8 weeks after surgery. The foot is at increased risk for swelling for several months. When you can expect to bear weight on the operated foot will depend on the extent of your surgery. The milder the deformity, the less tissue is removed and the sooner the return to normal activity level. During the recovery period, a special shoe, boot, or cast may be worn to accommodate the surgical bandage and to help provide stability to the foot.   Once you are home, an ice pack applied to the operative site may help with discomfort and keep swelling down. Stop using the ice if it causes discomfort.   Keep your feet raised (elevated) when possible to lessen swelling.   If you have an elastic bandage on your foot and you have numbness, tingling, or your foot becomes cold and blue, adjust the bandage to make it comfortable.   Change dressings as directed.   Keep the wound dry and clean. The wound may be washed gently with soap and water. Gently blot dry without rubbing. Do not take baths or use swimming pools or hot tubs for 10 days, or as instructed by your caregiver.   Only take over-the-counter or prescription medicines for pain, discomfort, or fever as directed by your caregiver.   You may continue a normal diet as directed.   For activity, use crutches with no weight bearing or your orthopedic shoe as directed. Continue to use crutches or a cane as directed until you can stand without causing pain.  SEEK MEDICAL CARE IF:   You have  redness, swelling, bruising, or increasing pain in the wound.   There is pus coming from the wound.   You have drainage from a wound lasting longer than 1 day.   You have an oral temperature above 102 F (38.9 C).   You notice a bad smell coming from the wound or dressing.   The wound breaks open after sutures have been removed.   You develop dizzy episodes or fainting while standing.   You have persistent nausea or vomiting.   Your toes become cold.   Pain is not relieved with medicines.  SEEK IMMEDIATE MEDICAL CARE IF:   You develop a rash.   You have difficulty breathing.   You develop any reaction or side effects to medicines given.   Your toes are numb or blue, or you have severe pain.  MAKE SURE YOU:   Understand these instructions.   Will watch your condition.   Will get help right away if you are not doing well or get worse.  Document Released: 02/14/2005 Document Revised: 11/13/2010 Document Reviewed: 03/22/2007 [facility] Patient Information 2012 [place], [place].   PATIENT INSTRUCTIONS POST-ANESTHESIA  IMMEDIATELY FOLLOWING SURGERY:  Do not drive or operate machinery for the first twenty four hours after surgery.  Do not make any important decisions for twenty four hours after surgery or while taking narcotic pain medications or sedatives.  If you develop intractable nausea and vomiting or a severe headache please notify your doctor immediately.  FOLLOW-UP:  Please make an appointment with your surgeon as instructed. You do not need to follow up with anesthesia unless specifically instructed to do so.  WOUND CARE INSTRUCTIONS (if applicable):  Keep a dry clean dressing on the anesthesia/puncture wound site if there is drainage.  Once the wound has quit draining you may leave it open to air.  Generally you should leave the bandage intact for twenty four hours unless there is drainage.  If the epidural site drains for more than 36-48 hours please call the  anesthesia department.  QUESTIONS?:  Please feel free to call your physician or the hospital operator if you have any questions, and they will be happy to assist you.     [facility] Anesthesia Department [address] [place] [phone]

## 2011-05-21 NOTE — H&P (Signed)
Chief Complaint / History of Present Illness: This 66 year old female returns today for a consultation to discusses the proposed surgery.  She is scheduled for correction of a bunion deformity of the left foot on 05/22/2011 at [facility].  Past Medical History:  Musculoskeletal Hx: (+) arthritis conditions.   Cardiovascular Hx: (+) hypertension, high cholesterol.   Medication History: Active: pravastatin (active), ibuprofen (active), atenolol (active), potassium (active).   Allergies: Patient/Guardian admits allergies to penicillin resulting in itching.   Past Surgical History: Patient/Guardian admits past surgical history of tubal ligation, hysterectomy, cyst removed from right foot, cyst removed from breast, bunionectomy right foot.   Social History: Patient/Guardian admits tobacco use. She smokes 1 pack per day, Patient/Guardian denies alcohol use.   Family History: Patient/Guardian admits a family history of stroke associated with father, mother.   Review of Systems: No abnormal findings with the exception of the chief complaint.    Physical Exam: The patient is a pleasant, 66 year old female in no apparent distress.  She is oriented to person, place and time.  She is ambulatory.  Skin is warm, dry and supple.  All nails are well trimmed and free of pathology.  Pedal hair growth and distribution is normal.  No hyperkeratotic lesions are present.  No ulceration are present.  Dorsalis pedis and posterior tibial pulses measure 2/4.  Capillary refill time is immediate.  No pedal edema is present.  Muscle tone is normal.  Muscle strength is 5/5 with regards to dorsiflexion, plantarflexion, inversion and eversion.   There is mild dorsal contraction of the 2nd digit of both feet.  There is lateral deviation of the hallux with a dorsomedial prominence of the first metatarsal head of the left foot feet.  Light touch sensation is grossly intact.    Test Results:  None to report at this time.     Impression:  Hallux valgus.    Plan:   The podiatric pathology and treatment options were again reviewed with the her.  I discussed with the her the surgical procedure itself, the indications, the risks, possible complications, post-operative course and alternative treatments. I gave no guarantees regarding the outcome. She would like to proceed with the proposed surgery.  An informed consent was obtained.  She is to return for her post-operative appointment or sooner if problems arise.  Prescriptions: Rx: Walker-  , Dx:  Hallux valgus.  Refills: 0.  Allow Generic: Yes Rx: Phenergan- 12.5 mg tablet , Take 1 tablet by mouth every four to six hours as needed. Max daily dose: 6.  Dispense: 21 tablet. Refills: 0.  Allow Generic: Yes Rx: Percocet- 325 mg-10mg  tablet , Take 1 tablet by mouth every four to six hours as needed for pain. Max daily dose: 6.  Dispense: 42 tablet. Refills: 0.  Allow Generic: Yes

## 2011-05-22 ENCOUNTER — Ambulatory Visit (HOSPITAL_COMMUNITY): Payer: PRIVATE HEALTH INSURANCE

## 2011-05-22 ENCOUNTER — Encounter (HOSPITAL_COMMUNITY)
Admission: RE | Disposition: A | Discharge status: Home / Self Care | Payer: Self-pay | Source: Ambulatory Visit | Attending: Podiatry

## 2011-05-22 ENCOUNTER — Ambulatory Visit (HOSPITAL_COMMUNITY)
Admission: RE | Admit: 2011-05-22 | Discharge: 2011-05-22 | Disposition: A | Discharge status: Home / Self Care | Payer: PRIVATE HEALTH INSURANCE | Source: Ambulatory Visit | Attending: Podiatry | Admitting: Podiatry

## 2011-05-22 ENCOUNTER — Encounter (HOSPITAL_COMMUNITY): Payer: Self-pay | Admitting: Anesthesiology

## 2011-05-22 ENCOUNTER — Encounter (HOSPITAL_COMMUNITY): Payer: Self-pay | Admitting: *Deleted

## 2011-05-22 ENCOUNTER — Ambulatory Visit (HOSPITAL_COMMUNITY): Payer: PRIVATE HEALTH INSURANCE | Admitting: Anesthesiology

## 2011-05-22 DIAGNOSIS — J449 Chronic obstructive pulmonary disease, unspecified: Secondary | ICD-10-CM | POA: Insufficient documentation

## 2011-05-22 DIAGNOSIS — M2010 Hallux valgus (acquired), unspecified foot: Secondary | ICD-10-CM | POA: Insufficient documentation

## 2011-05-22 DIAGNOSIS — I10 Essential (primary) hypertension: Secondary | ICD-10-CM | POA: Insufficient documentation

## 2011-05-22 DIAGNOSIS — Z01812 Encounter for preprocedural laboratory examination: Secondary | ICD-10-CM | POA: Insufficient documentation

## 2011-05-22 DIAGNOSIS — Z79899 Other long term (current) drug therapy: Secondary | ICD-10-CM | POA: Insufficient documentation

## 2011-05-22 DIAGNOSIS — J4489 Other specified chronic obstructive pulmonary disease: Secondary | ICD-10-CM | POA: Insufficient documentation

## 2011-05-22 HISTORY — PX: BUNIONECTOMY: SHX129

## 2011-05-22 HISTORY — PX: METATARSAL OSTEOTOMY: SHX1641

## 2011-05-22 SURGERY — BUNIONECTOMY
Anesthesia: Monitor Anesthesia Care | Laterality: Left | Wound class: Clean

## 2011-05-22 MED ORDER — MIDAZOLAM HCL 2 MG/2ML IJ SOLN
INTRAMUSCULAR | Status: AC
  Administered 2011-05-22: 2 mg via INTRAVENOUS
  Filled 2011-05-22: qty 2

## 2011-05-22 MED ORDER — PROPOFOL 10 MG/ML IV EMUL
INTRAVENOUS | Status: DC | PRN
  Administered 2011-05-22: 80 ug/kg/min via INTRAVENOUS

## 2011-05-22 MED ORDER — SODIUM CHLORIDE 0.9 % IR SOLN
Status: DC | PRN
  Administered 2011-05-22: 1000 mL

## 2011-05-22 MED ORDER — PROPOFOL 10 MG/ML IV EMUL
INTRAVENOUS | Status: AC
  Filled 2011-05-22: qty 20

## 2011-05-22 MED ORDER — BUPIVACAINE HCL (PF) 0.5 % IJ SOLN
INTRAMUSCULAR | Status: DC | PRN
  Administered 2011-05-22: 20 mL

## 2011-05-22 MED ORDER — LACTATED RINGERS IV SOLN
INTRAVENOUS | Status: DC
  Administered 2011-05-22: 10:00:00 via INTRAVENOUS

## 2011-05-22 MED ORDER — CLINDAMYCIN PHOSPHATE 600 MG/50ML IV SOLN
INTRAVENOUS | Status: AC
  Filled 2011-05-22: qty 50

## 2011-05-22 MED ORDER — ONDANSETRON HCL 4 MG/2ML IJ SOLN: 4.0000 mg | Freq: Once | INTRAMUSCULAR | Status: DC | PRN

## 2011-05-22 MED ORDER — LIDOCAINE HCL (CARDIAC) 10 MG/ML IV SOLN
INTRAVENOUS | Status: DC | PRN
  Administered 2011-05-22: 50 mg via INTRAVENOUS

## 2011-05-22 MED ORDER — LACTATED RINGERS IV SOLN
INTRAVENOUS | Status: DC | PRN
  Administered 2011-05-22: 10:00:00 via INTRAVENOUS

## 2011-05-22 MED ORDER — FENTANYL CITRATE 0.05 MG/ML IJ SOLN
INTRAMUSCULAR | Status: AC
  Filled 2011-05-22: qty 2

## 2011-05-22 MED ORDER — LIDOCAINE HCL (PF) 1 % IJ SOLN
INTRAMUSCULAR | Status: AC
  Filled 2011-05-22: qty 5

## 2011-05-22 MED ORDER — MIDAZOLAM HCL 5 MG/5ML IJ SOLN
INTRAMUSCULAR | Status: DC | PRN
  Administered 2011-05-22: 2 mg via INTRAVENOUS

## 2011-05-22 MED ORDER — LIDOCAINE HCL (PF) 1 % IJ SOLN
INTRAMUSCULAR | Status: AC
  Filled 2011-05-22: qty 30

## 2011-05-22 MED ORDER — MIDAZOLAM HCL 2 MG/2ML IJ SOLN
1.0000 mg | INTRAMUSCULAR | Status: DC | PRN
  Administered 2011-05-22: 2 mg via INTRAVENOUS

## 2011-05-22 MED ORDER — FENTANYL CITRATE 0.05 MG/ML IJ SOLN
INTRAMUSCULAR | Status: DC | PRN
  Administered 2011-05-22 (×2): 50 ug via INTRAVENOUS

## 2011-05-22 MED ORDER — BUPIVACAINE HCL (PF) 0.5 % IJ SOLN
INTRAMUSCULAR | Status: AC
  Filled 2011-05-22: qty 30

## 2011-05-22 MED ORDER — MIDAZOLAM HCL 2 MG/2ML IJ SOLN
INTRAMUSCULAR | Status: AC
  Filled 2011-05-22: qty 2

## 2011-05-22 MED ORDER — CLINDAMYCIN PHOSPHATE 600 MG/50ML IV SOLN
INTRAVENOUS | Status: DC | PRN
  Administered 2011-05-22: 600 mg via INTRAVENOUS

## 2011-05-22 MED ORDER — CLINDAMYCIN PHOSPHATE 600 MG/50ML IV SOLN: 600.0000 mg | Freq: Once | INTRAVENOUS | Status: DC

## 2011-05-22 MED ORDER — FENTANYL CITRATE 0.05 MG/ML IJ SOLN: 25.0000 ug | INTRAMUSCULAR | Status: DC | PRN

## 2011-05-22 SURGICAL SUPPLY — 51 items
BAG HAMPER (MISCELLANEOUS) ×2 IMPLANT
BANDAGE CONFORM 2  STR LF (GAUZE/BANDAGES/DRESSINGS) ×2 IMPLANT
BANDAGE ELASTIC 4 VELCRO NS (GAUZE/BANDAGES/DRESSINGS) ×2 IMPLANT
BANDAGE ESMARK 4X12 BL STRL LF (DISPOSABLE) ×1 IMPLANT
BANDAGE GAUZE ELAST BULKY 4 IN (GAUZE/BANDAGES/DRESSINGS) ×2 IMPLANT
BENZOIN TINCTURE PRP APPL 2/3 (GAUZE/BANDAGES/DRESSINGS) ×2 IMPLANT
BIT DRILL 2.0 HCS 150 (BIT) ×2 IMPLANT
BLADE AVERAGE 25X9 (BLADE) ×2 IMPLANT
BLADE OSC/SAGITTAL MD 9X18.5 (BLADE) ×2 IMPLANT
BLADE SURG 15 STRL LF DISP TIS (BLADE) ×1 IMPLANT
BLADE SURG 15 STRL SS (BLADE) ×1
BNDG ESMARK 4X12 BLUE STRL LF (DISPOSABLE) ×2
CHLORAPREP W/TINT 26ML (MISCELLANEOUS) ×2 IMPLANT
CLOTH BEACON ORANGE TIMEOUT ST (SAFETY) ×2 IMPLANT
COVER LIGHT HANDLE STERIS (MISCELLANEOUS) ×4 IMPLANT
CUFF TOURNIQUET SINGLE 18IN (TOURNIQUET CUFF) ×2 IMPLANT
DECANTER SPIKE VIAL GLASS SM (MISCELLANEOUS) ×2 IMPLANT
DRAPE OEC MINIVIEW 54X84 (DRAPES) ×2 IMPLANT
DRSG ADAPTIC 3X8 NADH LF (GAUZE/BANDAGES/DRESSINGS) ×2 IMPLANT
ELECT REM PT RETURN 9FT ADLT (ELECTROSURGICAL) ×2
ELECTRODE REM PT RTRN 9FT ADLT (ELECTROSURGICAL) ×1 IMPLANT
GLOVE BIO SURGEON STRL SZ7.5 (GLOVE) ×2 IMPLANT
GLOVE BIO SURGEON STRL SZ8 (GLOVE) ×2 IMPLANT
GLOVE BIOGEL PI IND STRL 7.0 (GLOVE) ×1 IMPLANT
GLOVE BIOGEL PI IND STRL 8.5 (GLOVE) ×1 IMPLANT
GLOVE BIOGEL PI INDICATOR 7.0 (GLOVE) ×1
GLOVE BIOGEL PI INDICATOR 8.5 (GLOVE) ×1
GLOVE ECLIPSE 7.0 STRL STRAW (GLOVE) ×2 IMPLANT
GLOVE ECLIPSE 8.0 STRL XLNG CF (GLOVE) ×4 IMPLANT
GLOVE SS BIOGEL STRL SZ 6.5 (GLOVE) ×1 IMPLANT
GLOVE SUPERSENSE BIOGEL SZ 6.5 (GLOVE) ×1
GOWN STRL REIN 3XL XLG LVL4 (GOWN DISPOSABLE) ×2 IMPLANT
GOWN STRL REIN XL XLG (GOWN DISPOSABLE) ×4 IMPLANT
K-WIRE NON THREAD 1.1 (WIRE) ×4
KIT ROOM TURNOVER APOR (KITS) ×2 IMPLANT
KWIRE NON THREAD 1.1 (WIRE) ×2 IMPLANT
MANIFOLD NEPTUNE II (INSTRUMENTS) ×2 IMPLANT
NEEDLE HYPO 27GX1-1/4 (NEEDLE) ×6 IMPLANT
NS IRRIG 1000ML POUR BTL (IV SOLUTION) ×2 IMPLANT
PACK BASIC LIMB (CUSTOM PROCEDURE TRAY) ×2 IMPLANT
PAD ARMBOARD 7.5X6 YLW CONV (MISCELLANEOUS) ×2 IMPLANT
SCREW CANN COMP 3.0MMX18MM (Screw) ×2 IMPLANT
SCREW HEADLESS COMPRESS 16X3.0 (Screw) ×2 IMPLANT
SET BASIN LINEN APH (SET/KITS/TRAYS/PACK) ×2 IMPLANT
SPONGE GAUZE 4X4 12PLY (GAUZE/BANDAGES/DRESSINGS) ×2 IMPLANT
STRIP CLOSURE SKIN 1/2X4 (GAUZE/BANDAGES/DRESSINGS) ×2 IMPLANT
SUT MON AB 5-0 PS2 18 (SUTURE) ×2 IMPLANT
SUT VIC AB 2-0 CT2 27 (SUTURE) ×2 IMPLANT
SUT VIC AB 4-0 PS2 27 (SUTURE) ×2 IMPLANT
SUT VICRYL AB 3-0 FS1 BRD 27IN (SUTURE) ×2 IMPLANT
SYR CONTROL 10ML LL (SYRINGE) ×4 IMPLANT

## 2011-05-22 NOTE — Preoperative (Signed)
Beta Blockers   Reason not to administer Beta Blockers:Not Applicable 

## 2011-05-22 NOTE — Addendum Note (Signed)
Addendum  created 05/22/11 1353 by Corena Pilgrim, CRNA   Modules edited:Anesthesia Medication Administration

## 2011-05-22 NOTE — Addendum Note (Signed)
Addendum  created 05/22/11 1307 by Corena Pilgrim, CRNA   Modules edited:Anesthesia Medication Administration

## 2011-05-22 NOTE — Brief Op Note (Signed)
OPERATIVE REPORT  SURGEON:   Dallas Schimke, [place]  OR STAFF:   Lennox Pippins, RN - Circulator Hurshel Party, CST - Scrub Person Carver Fila, RN - RN First Assistant   PREOPERATIVE DIAGNOSIS:   Hallux valgus left foot  POSTOPERATIVE DIAGNOSIS: Hallux valgus left foot  PROCEDURE: Eliberto Ivory bunionectomy left foot Akin osteotomy left foot  ANESTHESIA:  Monitor Anesthesia Care   HEMOSTASIS:    Total Tourniquet Time Documented: Leg (Left) - 57 minutes; Tourniquet set at 250 mmHg  ESTIMATED BLOOD LOSS:   Minimal  MATERIALS USED:  3.75mm Synthes Screw  INJECTABLES: Marcaine 0.5% plain; 20mL  PATHOLOGY:   None  COMPLICATIONS:   None  INDICATIONS:  Painful bunion deformity of the left foot  DICTATION:  Other Dictation: Dictation Number [id number]

## 2011-05-22 NOTE — H&P (Signed)
HISTORY AND PHYSICAL INTERVAL NOTE:  05/22/2011  10:41 AM  Leah Lynch  has presented today for surgery, with the diagnosis of hallux valgus left foot.  The various methods of treatment have been discussed with the patient.  No guarantees were given.  After consideration of risks, benefits and other options for treatment, the patient has consented to surgery.  I have reviewed the patients' chart and labs.    Patient Vitals for the past 24 hrs:  Temp Temp src  05/22/11 0940 97.8 F (36.6 C) Oral    A history and physical examination was performed in my office on 05/14/11.  The patient was reexamined.  There have been no changes to this history and physical examination.  Dallas Schimke, DPM

## 2011-05-22 NOTE — Anesthesia Procedure Notes (Signed)
Procedure Name: MAC Date/Time: 05/22/2011 11:07 AM Performed by: Carolyne Littles, Herbert Marken Pre-anesthesia Checklist: Patient identified, Timeout performed, Emergency Drugs available, Suction available and Patient being monitored Oxygen Delivery Method: Non-rebreather mask

## 2011-05-22 NOTE — Anesthesia Preprocedure Evaluation (Addendum)
Anesthesia Evaluation   Patient awake    Reviewed: Allergy & Precautions, H&P , NPO status , Patient's Chart, lab work & pertinent test results  History of Anesthesia Complications Negative for: history of anesthetic complications  Airway Mallampati: II    Mouth opening: Limited Mouth Opening  Dental  (+) Teeth Intact   Pulmonary COPDRecent URI , Resolved,    Pulmonary exam normal       Cardiovascular hypertension, Pt. on medications Rhythm:Regular Rate:Normal     Neuro/Psych    GI/Hepatic   Endo/Other    Renal/GU      Musculoskeletal  (+) Fibromyalgia -  Abdominal   Peds  Hematology   Anesthesia Other Findings   Reproductive/Obstetrics                          Anesthesia Physical Anesthesia Plan  ASA: III  Anesthesia Plan: MAC   Post-op Pain Management:    Induction: Intravenous  Airway Management Planned: Nasal Cannula  Additional Equipment:   Intra-op Plan:   Post-operative Plan:   Informed Consent: I have reviewed the patients History and Physical, chart, labs and discussed the procedure including the risks, benefits and alternatives for the proposed anesthesia with the patient or authorized representative who has indicated his/her understanding and acceptance.     Plan Discussed with:   Anesthesia Plan Comments:         Anesthesia Quick Evaluation

## 2011-05-22 NOTE — Anesthesia Postprocedure Evaluation (Signed)
  Anesthesia Post-op Note  Patient: Leah Lynch  Procedure(s) Performed: Procedure(s) (LRB): BUNIONECTOMY (Left) METATARSAL OSTEOTOMY (Left)  Patient Location: PACU  Anesthesia Type: MAC  Level of Consciousness: awake, alert , oriented and patient cooperative  Airway and Oxygen Therapy: Patient Spontanous Breathing and Patient connected to nasal cannula oxygen  Post-op Pain: none  Post-op Assessment: Post-op Vital signs reviewed, Patient's Cardiovascular Status Stable, Respiratory Function Stable and Patent Airway  Post-op Vital Signs: Reviewed and stable  Complications: No apparent anesthesia complications

## 2011-05-22 NOTE — Transfer of Care (Signed)
Immediate Anesthesia Transfer of Care Note  Patient: Leah Lynch  Procedure(s) Performed: Procedure(s) (LRB): BUNIONECTOMY (Left) METATARSAL OSTEOTOMY (Left)  Patient Location: PACU  Anesthesia Type: MAC  Level of Consciousness: awake, alert , oriented and patient cooperative  Airway & Oxygen Therapy: Patient Spontanous Breathing and Patient connected to nasal cannula oxygen  Post-op Assessment: Report given to PACU RN and Post -op Vital signs reviewed and stable  Post vital signs: Reviewed and stable  Complications: No apparent anesthesia complications

## 2011-05-23 NOTE — Op Note (Signed)
NAME:  Leah Lynch, Leah Lynch           ACCOUNT NO.:  [id number]  MEDICAL RECORD NO.:  [id number]  LOCATION:                                 FACILITY:  PHYSICIAN:  B. Theola Sequin, MD   DATE OF BIRTH:  1945/07/11  DATE OF PROCEDURE:  05/22/2011 DATE OF DISCHARGE:                              OPERATIVE REPORT   SURGEON:  B. Theola Sequin, MD  ASSISTANT:  Holley Bouche, MD  PREOPERATIVE DIAGNOSIS:  Hallux valgus, left foot.  POSTOPERATIVE DIAGNOSIS:  Hallux valgus, left foot.  PROCEDURES: 1. [name] bunionectomy, left foot. 2. Akin osteotomy proximal phalanx, left hallux.  ANESTHESIA:  MAC with local.  HEMOSTASIS:  Pneumatic.  ANKLE TOURNIQUET:  At 250 mmHg.  ESTIMATED BLOOD LOSS:  Minimal (less than 5 mL).  INJECTABLE:  0.5% Marcaine plain.  PATHOLOGY:  None.  COMPLICATIONS:  None.  PROCEDURE IN DETAIL:  The patient was brought to the operating room, placed on the operating room table in supine position.  Pneumatic ankle tourniquet was placed about the patient's left ankle.  The foot was anesthetized using 0.5% Marcaine plain.  The foot was scrubbed, prepped, and draped in usual sterile manner.  The limb was then elevated, exsanguinated, and the pneumatic ankle tourniquet inflated to 250 mmHg.  Attention was directed to the dorsomedial aspect of the left foot where a 6 cm curvilinear incision was made medial and parallel to the extensor hallucis longus tendon, and involving contour of the deformity.  The incision was deepened through the subcutaneous tissues down to the level of the first metatarsophalangeal joint.  A linear longitudinal periosteal capsular incision was then performed.  The periosteal and capsular structures were reflected medially and laterally thus exposing the head of the first metatarsal and base of the proximal phalanx at the operative site.  The prominent medial eminence of the first metatarsal head was resected using a power bone saw.   Attention was directed to the first interspace via the original skin incision where the tendon of the adductor hallucis muscle was identified and transected at its attachment to the base of proximal phalanx.  Attention was redirected to the medial aspect of first metatarsal head where a chevron osteotomy was performed. The capital fragment was distracted and shifted into a more corrected lateral position and impacted upon the first metatarsal shaft.  A 3.0 mm Synthes screw was inserted across the osteotomy site with adequate compression noted.  Position of the screw was confirmed with C-arm fluoroscopy.  The remaining medial bone shelf was resected using a power bone saw.  Attention was then directed to the proximal phalanx where a wedge-shaped osteotomy was performed with the base oriented medially and the apex proximally and laterally.  The wedge of bone was removed and the hinge Feathered to allow adequate closure of the osteotomy.  A 3.0 mm Synthes screw was inserted across the osteotomy site with adequate compression noted.  Position of the screw was confirmed with C-arm fluoroscopy.  The  Wound was irrigated with copious amounts of sterile irrigant.  The periosteal capsular incision was reapproximated using 3-0 Vicryl.  A medial capsulorrhaphy was performed to the first metatarsophalangeal joint using 2-0 Vicryl.  Subcutaneous structure reapproximated using  4-0 Vicryl.  Skin was reapproximated using 5-0 Monocryl in a running subcuticular manner.  Incision was reinforced with Steri-Strips. Sterile compressive dressing was then applied to the operative field. The pneumatic ankle tourniquet was deflated and a prompt hyperemic response was noted to all digits of the operative foot.          ______________________________ B. Theola Sequin, MD     BIM/MEDQ  D:  05/22/2011  T:  05/23/2011  Job:  [id number]

## 2011-05-26 ENCOUNTER — Encounter (HOSPITAL_COMMUNITY): Payer: Self-pay | Admitting: Podiatry

## 2011-09-11 ENCOUNTER — Emergency Department (HOSPITAL_COMMUNITY): Payer: PRIVATE HEALTH INSURANCE

## 2011-09-11 ENCOUNTER — Encounter (HOSPITAL_COMMUNITY): Payer: Self-pay | Admitting: *Deleted

## 2011-09-11 ENCOUNTER — Emergency Department (HOSPITAL_COMMUNITY)
Admission: EM | Admit: 2011-09-11 | Discharge: 2011-09-11 | Disposition: A | Discharge status: Home / Self Care | Payer: PRIVATE HEALTH INSURANCE | Attending: Emergency Medicine | Admitting: Emergency Medicine

## 2011-09-11 DIAGNOSIS — Z79899 Other long term (current) drug therapy: Secondary | ICD-10-CM | POA: Insufficient documentation

## 2011-09-11 DIAGNOSIS — I10 Essential (primary) hypertension: Secondary | ICD-10-CM | POA: Insufficient documentation

## 2011-09-11 DIAGNOSIS — R51 Headache: Secondary | ICD-10-CM | POA: Insufficient documentation

## 2011-09-11 DIAGNOSIS — E785 Hyperlipidemia, unspecified: Secondary | ICD-10-CM | POA: Insufficient documentation

## 2011-09-11 DIAGNOSIS — R1013 Epigastric pain: Secondary | ICD-10-CM | POA: Insufficient documentation

## 2011-09-11 DIAGNOSIS — M542 Cervicalgia: Secondary | ICD-10-CM | POA: Insufficient documentation

## 2011-09-11 LAB — CBC WITH DIFFERENTIAL/PLATELET
Basophils Relative: 0 % (ref 0–1)
Eosinophils Absolute: 0.4 10*3/uL (ref 0.0–0.7)
Hemoglobin: 12.4 g/dL (ref 12.0–15.0)
Lymphs Abs: 4.3 10*3/uL — ABNORMAL HIGH (ref 0.7–4.0)
MCH: 27.3 pg (ref 26.0–34.0)
Neutro Abs: 7.5 10*3/uL (ref 1.7–7.7)
Neutrophils Relative %: 57 % (ref 43–77)
Platelets: 311 10*3/uL (ref 150–400)
RBC: 4.54 MIL/uL (ref 3.87–5.11)

## 2011-09-11 LAB — COMPREHENSIVE METABOLIC PANEL
AST: 16 U/L (ref 0–37)
Albumin: 3.5 g/dL (ref 3.5–5.2)
Calcium: 9.6 mg/dL (ref 8.4–10.5)
Creatinine, Ser: 0.62 mg/dL (ref 0.50–1.10)
GFR calc non Af Amer: >90 mL/min (ref >90)

## 2011-09-11 MED ORDER — FAMOTIDINE 20 MG PO TABS: 20.0000 mg | ORAL_TABLET | Freq: Two times a day (BID) | ORAL | Status: DC

## 2011-09-11 MED ORDER — SODIUM CHLORIDE 0.9 % IV BOLUS (SEPSIS)
250.0000 mL | Freq: Once | INTRAVENOUS | Status: AC
  Administered 2011-09-11: 250 mL via INTRAVENOUS

## 2011-09-11 MED ORDER — ONDANSETRON HCL 4 MG/2ML IJ SOLN
4.0000 mg | Freq: Once | INTRAMUSCULAR | Status: AC
  Administered 2011-09-11: 4 mg via INTRAVENOUS
  Filled 2011-09-11: qty 2

## 2011-09-11 MED ORDER — SODIUM CHLORIDE 0.9 % IV SOLN: INTRAVENOUS | Status: DC

## 2011-09-11 MED ORDER — PANTOPRAZOLE SODIUM 40 MG IV SOLR
40.0000 mg | Freq: Once | INTRAVENOUS | Status: AC
  Administered 2011-09-11: 40 mg via INTRAVENOUS
  Filled 2011-09-11: qty 40

## 2011-09-11 MED ORDER — HYDROCODONE-ACETAMINOPHEN 5-325 MG PO TABS: 1.0000 | ORAL_TABLET | Freq: Four times a day (QID) | ORAL | Status: AC | PRN

## 2011-09-11 NOTE — ED Provider Notes (Signed)
History  This chart was scribed for Shelda Jakes, MD by Bennett Scrape. This patient was seen in room [facility]06/[facility]06 and the patient's care was started at 2:59PM.  CSN: [id number]  Arrival date & time 09/11/11  1409   First MD Initiated Contact with Patient 09/11/11 1459      Chief Complaint  Patient presents with  . Abdominal Pain     Patient is a 66 y.o. female presenting with abdominal pain. The history is provided by the patient. No language interpreter was used.  Abdominal Pain The primary symptoms of the illness include abdominal pain. The primary symptoms of the illness do not include fever, shortness of breath, nausea, vomiting, diarrhea or dysuria. The current episode started more than 2 days ago. The onset of the illness was gradual. The problem has been gradually worsening.  The abdominal pain is located in the epigastric region. The abdominal pain does not radiate. The abdominal pain is relieved by nothing. The abdominal pain is exacerbated by eating.  Symptoms associated with the illness do not include hematuria or back pain. Significant associated medical issues do not include inflammatory bowel disease or diabetes.    Leah Lynch is a 66 y.o. female who presents to the Emergency Department complaining of one month of gradual onset, gradually worsening, constant epigastric described as soreness. The pain does not radiate and is worse with eating. She reports that she occasionally wakes up at night with bile rising in her throat. She reports drinking milk and taking Tums with no improvement in her symptoms.  She denies having prior episodes of similar symptoms. She has not seen a MD for the symptoms prior to today. She has an appointment with her PCP in 5 days but states that she can't take the pain anymore and came today for evaluation. She denies having a h/o GERD. She denies urinary symptoms, sore throat, congestion, cough, back pain, chest pain, nausea, emesis and  diarrhea as associated symptoms. She has a h/o HLD, HTN and OA. She is a current everyday smoker and occasional alcohol user.  Dr. Sudie Bailey is PCP.  Past Medical History  Diagnosis Date  . Hypertension   . Hyperlipidemia   . Fibrocystic breast   . Osteoarthritis     Past Surgical History  Procedure Date  . Breast surgery     removal of cyst from right breast-fibrocystsic  . Foot ganglion excision     right foot  . Tubal ligation   . Abdominal hysterectomy   . Diagnostic laparoscopy   . Bunionectomy 12/19/2010    Procedure: Arbutus Leas;  Surgeon: Dallas Schimke;  Location: [facility];  Service: Orthopedics;  Laterality: Right;  [facility] [name] Bunionectomy  . Tonsillectomy age 47  . Bunionectomy 05/22/2011    Procedure: Arbutus Leas;  Surgeon: Dallas Schimke, DPM;  Location: [facility];  Service: Orthopedics;  Laterality: Left;  [name] Bunionectomy Left Foot  . Metatarsal osteotomy 05/22/2011    Procedure: METATARSAL OSTEOTOMY;  Surgeon: Dallas Schimke, DPM;  Location: [facility];  Service: Orthopedics;  Laterality: Left;  [name] Osteotomy Left Foot    Family History  Problem Relation Age of Onset  . Anesthesia problems Neg Hx   . Hypotension Neg Hx   . Malignant hyperthermia Neg Hx   . Pseudochol deficiency Neg Hx     History  Substance Use Topics  . Smoking status: Current Everyday Smoker -- 1.0 packs/day for 25 years    Types: Cigarettes  . Smokeless tobacco: Not on  file  . Alcohol Use: No    No OB history provided.   Review of Systems  Constitutional: Negative for fever.  HENT: Positive for neck pain (tightness). Negative for congestion and sore throat.   Eyes: Negative for visual disturbance.  Respiratory: Negative for cough and shortness of breath.   Cardiovascular: Negative for chest pain.  Gastrointestinal: Positive for abdominal pain. Negative for nausea, vomiting and diarrhea.  Genitourinary: Negative for dysuria and hematuria.    Musculoskeletal: Negative for back pain.  Skin: Negative for rash.  Neurological: Positive for headaches.  Hematological: Does not bruise/bleed easily.  Psychiatric/Behavioral: Negative for confusion.    Allergies  Penicillins; Bee venom; and Sulfa antibiotics  Home Medications   Current Outpatient Rx  Name Route Sig Dispense Refill  . ACETAMINOPHEN 500 MG PO TABS Oral Take 1,000 mg by mouth as needed. For pain    . ATENOLOL-CHLORTHALIDONE 50-25 MG PO TABS Oral Take 1 tablet by mouth every morning.     . SUPER B COMPLEX PO Oral Take 1 tablet by mouth every morning.     . IBUPROFEN 800 MG PO TABS Oral Take 800 mg by mouth 3 (three) times daily as needed. For pain    . MELATONIN 10 MG PO CAPS Oral Take 40 mg by mouth at bedtime.    [name] POTASSIUM CHLORIDE 10 MEQ PO TBCR Oral Take 20 mEq by mouth every morning.     [name] PRAVASTATIN SODIUM 40 MG PO TABS Oral Take 40 mg by mouth daily.        Triage Vitals: BP 122/67  Pulse 58  Temp 98.3 F (36.8 C) (Oral)  Resp 18  Ht 4\' 11"  (1.499 m)  Wt 144 lb (65.318 kg)  BMI 29.08 kg/m2  SpO2 95%  Physical Exam  Nursing note and vitals reviewed. Constitutional: She is oriented to person, place, and time. She appears well-developed and well-nourished. No distress.  HENT:  Head: Normocephalic and atraumatic.  Eyes: Conjunctivae and EOM are normal.  Neck: Normal range of motion. Neck supple. No tracheal deviation present.  Cardiovascular: Normal rate and regular rhythm.   No murmur heard. Pulmonary/Chest: Effort normal and breath sounds normal. No respiratory distress.  Abdominal: Soft. Bowel sounds are normal. There is no tenderness. There is no guarding.  Musculoskeletal: Normal range of motion. She exhibits no edema (No lower leg edema) and no tenderness.  Neurological: She is alert and oriented to person, place, and time. No cranial nerve deficit.  Skin: Skin is warm and dry.  Psychiatric: She has a normal mood and affect. Her behavior is  normal.    ED Course  Procedures (including critical care time)  DIAGNOSTIC STUDIES: Oxygen Saturation is 95% on room air, adequate by my interpretation.    COORDINATION OF CARE: 3:30PM-Discussed treatment plan which includes lab work and transfer to [facility] for a CT scan with pt and pt agreed to plan.   Labs Reviewed  CBC WITH DIFFERENTIAL - Abnormal; Notable for the following:    WBC 13.0 (*)     RDW 15.7 (*)     Lymphs Abs 4.3 (*)     All other components within normal limits  COMPREHENSIVE METABOLIC PANEL - Abnormal; Notable for the following:    Glucose, Bld 106 (*)     Total Bilirubin 0.2 (*)     All other components within normal limits  LIPASE, BLOOD   US Abdomen Complete  09/11/2011  *RADIOLOGY REPORT*  Clinical Data:  Abdominal pain  COMPLETE ABDOMINAL  ULTRASOUND  Comparison:  Ultrasound 03/27/2009  Findings:  Gallbladder:  The gallbladder is mildly contracted.  This makes visualization difficult.  No gallbladder wall thickening or pericholecystic fluid.  Negative sonographic Murphy's sign.  No gallstones evident.  Common bile duct:  Normal 4 mm.  Liver:  No focal lesion identified.  Within normal limits in parenchymal echogenicity.  IVC:  Appears normal.  Pancreas:  No focal abnormality seen.  Spleen:  Normal size and echogenicity.  Right Kidney:  10.2cm in length.  No evidence of hydronephrosis or stones.  Left Kidney:  11.4cm in length.  No evidence of hydronephrosis or stones.  Abdominal aorta:  No aneurysm identified.  IMPRESSION: No acute abdominal findings by ultrasound.  Original Report Authenticated By: Genevive Bi, M.D.   Dg Abd Acute W/chest  09/11/2011  *RADIOLOGY REPORT*  Clinical Data: Epigastric pain 1 month.  History of hypertension.  ACUTE ABDOMEN SERIES (ABDOMEN 2 VIEW & CHEST 1 VIEW)  Comparison: None.  Findings: Normal heart size with clear lung fields.  No bony abnormality.  No free air.  Flat and erect abdomen demonstrates nonobstructive gas pattern.  Chronic degenerative change L1-2 and L2-3.  No abnormal calcifications.  IMPRESSION: No acute findings.  Original Report Authenticated By: Elsie Stain, M.D.    Date: 09/11/2011  Rate: 61  Rhythm: normal sinus rhythm and premature ventricular contractions (PVC)  QRS Axis: normal  Intervals: normal  ST/T Wave abnormalities: normal  Conduction Disutrbances:none  Narrative Interpretation:   Old EKG Reviewed: none available    1. Epigastric abdominal pain       MDM  Symptoms most likely consistent with peptic ulcer disease ultrasound shows no gallbladder problems her function tests are normal lipase is not elevated therefore not consistent with pancreatitis. EKG without acute findings do not feel this is cardiac in nature. Chest x-ray negative no pneumonia no pneumothorax. Will treat with Pepcid and pain medicine patient can followup with Dr. Sudie Bailey in the next few days may very well need upper endoscopy to confirm peptic ulcer disease.      I personally performed the services described in this documentation, which was scribed in my presence. The recorded information has been reviewed and considered.     Shelda Jakes, MD 09/11/11 [phone]

## 2011-09-11 NOTE — Discharge Instructions (Signed)
Suspect symptoms are consistent with peptic ulcer disease but not confirmed ,no evidence of gallbladder disease. Follow up with Dr. Sudie Bailey next few days start Pepcid take hydrocodone as needed for pain. Return for new or worse symptoms.

## 2011-09-11 NOTE — ED Notes (Signed)
Pt c/o pain in her upper abdomen x 1 month. States that she wakes up in the night and has liquid going up in her throat. States that she has an appointment with Dr. Sudie Bailey on Tuesday but can't take the pain any more. Denies nausea, vomiting or diarrhea.

## 2011-09-16 ENCOUNTER — Other Ambulatory Visit (HOSPITAL_COMMUNITY): Payer: Self-pay | Admitting: Family Medicine

## 2011-09-16 DIAGNOSIS — Z139 Encounter for screening, unspecified: Secondary | ICD-10-CM

## 2011-09-23 ENCOUNTER — Ambulatory Visit (HOSPITAL_COMMUNITY): Payer: PRIVATE HEALTH INSURANCE

## 2011-09-25 ENCOUNTER — Other Ambulatory Visit (HOSPITAL_COMMUNITY): Payer: Self-pay | Admitting: Family Medicine

## 2011-09-25 ENCOUNTER — Ambulatory Visit (HOSPITAL_COMMUNITY)
Admission: RE | Admit: 2011-09-25 | Discharge: 2011-09-25 | Disposition: A | Discharge status: Home / Self Care | Payer: PRIVATE HEALTH INSURANCE | Source: Ambulatory Visit | Attending: Family Medicine | Admitting: Family Medicine

## 2011-09-25 DIAGNOSIS — Z139 Encounter for screening, unspecified: Secondary | ICD-10-CM

## 2011-09-25 DIAGNOSIS — M542 Cervicalgia: Secondary | ICD-10-CM

## 2011-09-25 DIAGNOSIS — Z1231 Encounter for screening mammogram for malignant neoplasm of breast: Secondary | ICD-10-CM | POA: Insufficient documentation

## 2012-01-22 ENCOUNTER — Encounter (HOSPITAL_COMMUNITY): Payer: Self-pay

## 2012-01-26 NOTE — Patient Instructions (Addendum)
Your procedure is scheduled on: 01/29/12  Report to [facility] at   10:30  AM.  Call this number if you have problems the morning of surgery: [phone]   Remember:   Do not drink or eat food:After Midnight.  :  Take these medicines the morning of surgery with A SIP OF WATER: Atenolol, Omeprazole   Do not wear jewelry, make-up or nail polish.  Do not wear lotions, powders, or perfumes. You may wear deodorant.  Do not shave 48 hours prior to surgery. Men may shave face and neck.  Do not bring valuables to the hospital.  Contacts, dentures or bridgework may not be worn into surgery.  Leave suitcase in the car. After surgery it may be brought to your room.  For patients admitted to the hospital, checkout time is 11:00 AM the day of discharge.   Patients discharged the day of surgery will not be allowed to drive home.    Special Instructions: Shower using CHG 2 nights before surgery and the night before surgery.  If you shower the day of surgery use CHG.  Use special wash - you have one bottle of CHG for all showers.  You should use approximately 1/3 of the bottle for each shower.   Please read over the following fact sheets that you were given: Pain Booklet, MRSA Information, Surgical Site Infection Prevention and Care and Recovery After Surgery   PATIENT INSTRUCTIONS POST-ANESTHESIA  IMMEDIATELY FOLLOWING SURGERY:  Do not drive or operate machinery for the first twenty four hours after surgery.  Do not make any important decisions for twenty four hours after surgery or while taking narcotic pain medications or sedatives.  If you develop intractable nausea and vomiting or a severe headache please notify your doctor immediately.  FOLLOW-UP:  Please make an appointment with your surgeon as instructed. You do not need to follow up with anesthesia unless specifically instructed to do so.  WOUND CARE INSTRUCTIONS (if applicable):  Keep a dry clean dressing on the anesthesia/puncture wound site  if there is drainage.  Once the wound has quit draining you may leave it open to air.  Generally you should leave the bandage intact for twenty four hours unless there is drainage.  If the epidural site drains for more than 36-48 hours please call the anesthesia department.  QUESTIONS?:  Please feel free to call your physician or the hospital operator if you have any questions, and they will be happy to assist you.      Bunionectomy Care After Refer to this sheet for the next few weeks. These discharge instructions provide you with general information on caring for yourself after you leave the hospital. Your caregiver may also give you specific instructions. Your treatment has been planned according to the most current medical practices available, but unavoidable complications sometimes occur. If you have any problems or questions after discharge, please call your caregiver. HOME CARE INSTRUCTIONS   Resume normal diet and activities as directed or allowed.  Put ice on the affected area.  Put ice in a plastic bag.  Place a towel between your skin and the bag.  Leave the ice on for 15 to 20 minutes each hour while awake for the first couple days following surgery.  Change bandages (dressings) if necessary or as directed.  Only take over-the-counter or prescription medicines for pain, discomfort, or fever as directed by your caregiver.  Make an appointment to see your caregiver for stitches (sutures) or staple removal as directed.  Put weight on the foot as your caregiver tells you. Most people are on crutches for the first week.  Do not wear high heels.  Wear your boot for 6 weeks or as directed by your caregiver. If you are told to use a splint or special shoe, do so until instructed otherwise.  Keep your foot raised (elevated). SEEK MEDICAL CARE IF:   You have increased bleeding from the wound.  You have redness, swelling, or increasing pain in the wound.  You have pus coming  from the wound.  You have a fever.  You notice a bad smell coming from the wound or dressing. SEEK IMMEDIATE MEDICAL CARE IF:   You have a rash.  You have difficulty breathing.  You have any reaction or side effects to medicines given. MAKE SURE YOU:   Understand these instructions.  Will watch your condition.  Will get help right away if you are not doing well or get worse. Document Released: 09/20/2004 Document Revised: 09/02/2011 Document Reviewed: 03/22/2007 [facility] Patient Information 2013 [place], [place].

## 2012-01-27 ENCOUNTER — Encounter (HOSPITAL_COMMUNITY)
Admission: RE | Admit: 2012-01-27 | Discharge: 2012-01-27 | Disposition: A | Discharge status: Home / Self Care | Payer: PRIVATE HEALTH INSURANCE | Source: Ambulatory Visit | Attending: Podiatry | Admitting: Podiatry

## 2012-01-27 ENCOUNTER — Encounter (HOSPITAL_COMMUNITY): Payer: Self-pay

## 2012-01-27 HISTORY — DX: Gastro-esophageal reflux disease without esophagitis: K21.9

## 2012-01-27 LAB — BASIC METABOLIC PANEL
Chloride: 100 mEq/L (ref 96–112)
Creatinine, Ser: 0.64 mg/dL (ref 0.50–1.10)
GFR calc Af Amer: >90 mL/min (ref >90)

## 2012-01-27 LAB — SURGICAL PCR SCREEN
MRSA, PCR: NEGATIVE
Staphylococcus aureus: NEGATIVE

## 2012-01-27 LAB — HEMOGLOBIN AND HEMATOCRIT, BLOOD: Hemoglobin: 13.5 g/dL (ref 12.0–15.0)

## 2012-01-29 ENCOUNTER — Encounter (HOSPITAL_COMMUNITY): Payer: Self-pay | Admitting: *Deleted

## 2012-01-29 ENCOUNTER — Ambulatory Visit (HOSPITAL_COMMUNITY): Payer: PRIVATE HEALTH INSURANCE | Admitting: Anesthesiology

## 2012-01-29 ENCOUNTER — Encounter (HOSPITAL_COMMUNITY): Payer: Self-pay | Admitting: Anesthesiology

## 2012-01-29 ENCOUNTER — Ambulatory Visit (HOSPITAL_COMMUNITY)
Admission: RE | Admit: 2012-01-29 | Discharge: 2012-01-29 | Disposition: A | Discharge status: Home / Self Care | Payer: PRIVATE HEALTH INSURANCE | Source: Ambulatory Visit | Attending: Podiatry | Admitting: Podiatry

## 2012-01-29 ENCOUNTER — Encounter (HOSPITAL_COMMUNITY)
Admission: RE | Disposition: A | Discharge status: Home / Self Care | Payer: Self-pay | Source: Ambulatory Visit | Attending: Podiatry

## 2012-01-29 ENCOUNTER — Ambulatory Visit (HOSPITAL_COMMUNITY): Payer: PRIVATE HEALTH INSURANCE

## 2012-01-29 DIAGNOSIS — K219 Gastro-esophageal reflux disease without esophagitis: Secondary | ICD-10-CM | POA: Insufficient documentation

## 2012-01-29 DIAGNOSIS — I10 Essential (primary) hypertension: Secondary | ICD-10-CM | POA: Insufficient documentation

## 2012-01-29 DIAGNOSIS — J4489 Other specified chronic obstructive pulmonary disease: Secondary | ICD-10-CM | POA: Insufficient documentation

## 2012-01-29 DIAGNOSIS — J449 Chronic obstructive pulmonary disease, unspecified: Secondary | ICD-10-CM | POA: Insufficient documentation

## 2012-01-29 DIAGNOSIS — M2030 Hallux varus (acquired), unspecified foot: Secondary | ICD-10-CM | POA: Insufficient documentation

## 2012-01-29 HISTORY — PX: CAPSULOTOMY: SHX379

## 2012-01-29 SURGERY — CAPSULOTOMY
Anesthesia: Monitor Anesthesia Care | Site: Foot | Laterality: Left | Wound class: Clean

## 2012-01-29 MED ORDER — OXYCODONE-ACETAMINOPHEN 5-325 MG PO TABS
ORAL_TABLET | ORAL | Status: AC
  Filled 2012-01-29: qty 1

## 2012-01-29 MED ORDER — LIDOCAINE HCL (PF) 1 % IJ SOLN
INTRAMUSCULAR | Status: AC
  Filled 2012-01-29: qty 5

## 2012-01-29 MED ORDER — ONDANSETRON HCL 4 MG/2ML IJ SOLN
INTRAMUSCULAR | Status: AC
  Filled 2012-01-29: qty 2

## 2012-01-29 MED ORDER — CLINDAMYCIN PHOSPHATE 900 MG/50ML IV SOLN
INTRAVENOUS | Status: DC | PRN
  Administered 2012-01-29: 600 mg via INTRAVENOUS

## 2012-01-29 MED ORDER — FENTANYL CITRATE 0.05 MG/ML IJ SOLN
INTRAMUSCULAR | Status: AC
  Filled 2012-01-29: qty 2

## 2012-01-29 MED ORDER — METOPROLOL TARTRATE 1 MG/ML IV SOLN
INTRAVENOUS | Status: AC
  Filled 2012-01-29: qty 5

## 2012-01-29 MED ORDER — FENTANYL CITRATE 0.05 MG/ML IJ SOLN
INTRAMUSCULAR | Status: DC | PRN
  Administered 2012-01-29: 25 ug via INTRAVENOUS
  Administered 2012-01-29: 50 ug via INTRAVENOUS
  Administered 2012-01-29: 25 ug via INTRAVENOUS

## 2012-01-29 MED ORDER — CLINDAMYCIN PHOSPHATE 600 MG/50ML IV SOLN: 600.0000 mg | Freq: Once | INTRAVENOUS | Status: DC

## 2012-01-29 MED ORDER — MIDAZOLAM HCL 5 MG/5ML IJ SOLN
INTRAMUSCULAR | Status: DC | PRN
  Administered 2012-01-29: 2 mg via INTRAVENOUS

## 2012-01-29 MED ORDER — OXYCODONE-ACETAMINOPHEN 5-325 MG PO TABS
ORAL_TABLET | ORAL | Status: AC
  Filled 2012-01-29: qty 2

## 2012-01-29 MED ORDER — SODIUM CHLORIDE 0.9 % IR SOLN
Status: DC | PRN
  Administered 2012-01-29: 1000 mL

## 2012-01-29 MED ORDER — LACTATED RINGERS IV SOLN
INTRAVENOUS | Status: DC
  Administered 2012-01-29: 1000 mL via INTRAVENOUS

## 2012-01-29 MED ORDER — LIDOCAINE HCL (PF) 1 % IJ SOLN
INTRAMUSCULAR | Status: AC
  Filled 2012-01-29: qty 30

## 2012-01-29 MED ORDER — ONDANSETRON HCL 4 MG/2ML IJ SOLN: 4.0000 mg | Freq: Once | INTRAMUSCULAR | Status: DC | PRN

## 2012-01-29 MED ORDER — LACTATED RINGERS IV SOLN
INTRAVENOUS | Status: DC | PRN
  Administered 2012-01-29 (×2): via INTRAVENOUS

## 2012-01-29 MED ORDER — MIDAZOLAM HCL 2 MG/2ML IJ SOLN
1.0000 mg | INTRAMUSCULAR | Status: DC | PRN
  Administered 2012-01-29 (×2): 2 mg via INTRAVENOUS

## 2012-01-29 MED ORDER — BUPIVACAINE HCL (PF) 0.5 % IJ SOLN
INTRAMUSCULAR | Status: AC
  Filled 2012-01-29: qty 30

## 2012-01-29 MED ORDER — PROPOFOL INFUSION 10 MG/ML OPTIME
INTRAVENOUS | Status: DC | PRN
  Administered 2012-01-29: 45 ug/kg/min via INTRAVENOUS

## 2012-01-29 MED ORDER — PROPOFOL 10 MG/ML IV EMUL
INTRAVENOUS | Status: AC
  Filled 2012-01-29: qty 20

## 2012-01-29 MED ORDER — OXYCODONE-ACETAMINOPHEN 5-325 MG PO TABS
1.0000 | ORAL_TABLET | ORAL | Status: AC | PRN
  Administered 2012-01-29: 2 via ORAL

## 2012-01-29 MED ORDER — CLINDAMYCIN PHOSPHATE 900 MG/50ML IV SOLN: 900.0000 mg | Freq: Once | INTRAVENOUS | Status: DC

## 2012-01-29 MED ORDER — BUPIVACAINE HCL (PF) 0.5 % IJ SOLN
INTRAMUSCULAR | Status: DC | PRN
  Administered 2012-01-29: 20 mL

## 2012-01-29 MED ORDER — LIDOCAINE HCL (PF) 1 % IJ SOLN
INTRAMUSCULAR | Status: DC | PRN
  Administered 2012-01-29: 7 mL

## 2012-01-29 MED ORDER — MIDAZOLAM HCL 2 MG/2ML IJ SOLN
INTRAMUSCULAR | Status: AC
  Filled 2012-01-29: qty 2

## 2012-01-29 MED ORDER — FENTANYL CITRATE 0.05 MG/ML IJ SOLN: 25.0000 ug | INTRAMUSCULAR | Status: DC | PRN

## 2012-01-29 MED ORDER — METOPROLOL TARTRATE 1 MG/ML IV SOLN
2.0000 mg | Freq: Once | INTRAVENOUS | Status: AC
  Administered 2012-01-29: 2 mg via INTRAVENOUS

## 2012-01-29 MED ORDER — CLINDAMYCIN PHOSPHATE 600 MG/50ML IV SOLN
INTRAVENOUS | Status: AC
  Filled 2012-01-29: qty 50

## 2012-01-29 SURGICAL SUPPLY — 47 items
BAG HAMPER (MISCELLANEOUS) ×2 IMPLANT
BANDAGE CONFORM 2  STR LF (GAUZE/BANDAGES/DRESSINGS) ×2 IMPLANT
BANDAGE ELASTIC 4 VELCRO NS (GAUZE/BANDAGES/DRESSINGS) ×2 IMPLANT
BANDAGE ESMARK 4X12 BL STRL LF (DISPOSABLE) ×1 IMPLANT
BANDAGE GAUZE ELAST BULKY 4 IN (GAUZE/BANDAGES/DRESSINGS) ×2 IMPLANT
BENZOIN TINCTURE PRP APPL 2/3 (GAUZE/BANDAGES/DRESSINGS) IMPLANT
BLADE AVERAGE 25X9 (BLADE) IMPLANT
BLADE SURG 15 STRL LF DISP TIS (BLADE) ×3 IMPLANT
BLADE SURG 15 STRL SS (BLADE) ×3
BNDG ESMARK 4X12 BLUE STRL LF (DISPOSABLE) ×2
CHLORAPREP W/TINT 26ML (MISCELLANEOUS) ×2 IMPLANT
CLOTH BEACON ORANGE TIMEOUT ST (SAFETY) ×2 IMPLANT
COVER LIGHT HANDLE STERIS (MISCELLANEOUS) ×4 IMPLANT
COVER MAYO STAND XLG (DRAPE) ×4 IMPLANT
CUFF TOURNIQUET SINGLE 18IN (TOURNIQUET CUFF) ×2 IMPLANT
DECANTER SPIKE VIAL GLASS SM (MISCELLANEOUS) ×4 IMPLANT
DRAPE OEC MINIVIEW 54X84 (DRAPES) ×2 IMPLANT
DRSG ADAPTIC 3X8 NADH LF (GAUZE/BANDAGES/DRESSINGS) ×2 IMPLANT
DURA STEPPER LG (CAST SUPPLIES) IMPLANT
DURA STEPPER MED (CAST SUPPLIES) IMPLANT
DURA STEPPER SML (CAST SUPPLIES) IMPLANT
DURA STEPPER XL (SOFTGOODS) IMPLANT
ELECT REM PT RETURN 9FT ADLT (ELECTROSURGICAL) ×2
ELECTRODE REM PT RTRN 9FT ADLT (ELECTROSURGICAL) ×1 IMPLANT
GLOVE BIO SURGEON STRL SZ7.5 (GLOVE) ×2 IMPLANT
GLOVE BIOGEL PI IND STRL 7.0 (GLOVE) ×2 IMPLANT
GLOVE BIOGEL PI INDICATOR 7.0 (GLOVE) ×2
GLOVE EXAM NITRILE MD LF STRL (GLOVE) ×2 IMPLANT
GLOVE SS BIOGEL STRL SZ 6.5 (GLOVE) ×1 IMPLANT
GLOVE SUPERSENSE BIOGEL SZ 6.5 (GLOVE) ×1
GOWN STRL REIN XL XLG (GOWN DISPOSABLE) ×8 IMPLANT
KIT ROOM TURNOVER AP CYSTO (KITS) ×2 IMPLANT
MANIFOLD NEPTUNE II (INSTRUMENTS) ×2 IMPLANT
NEEDLE HYPO 27GX1-1/4 (NEEDLE) ×6 IMPLANT
NS IRRIG 1000ML POUR BTL (IV SOLUTION) ×2 IMPLANT
PACK BASIC LIMB (CUSTOM PROCEDURE TRAY) ×2 IMPLANT
PAD ARMBOARD 7.5X6 YLW CONV (MISCELLANEOUS) ×2 IMPLANT
RASP SM TEAR CROSS CUT (RASP) IMPLANT
SET BASIN LINEN APH (SET/KITS/TRAYS/PACK) ×2 IMPLANT
SPONGE GAUZE 4X4 12PLY (GAUZE/BANDAGES/DRESSINGS) ×2 IMPLANT
SPONGE LAP 18X18 X RAY DECT (DISPOSABLE) ×2 IMPLANT
STRIP CLOSURE SKIN 1/2X4 (GAUZE/BANDAGES/DRESSINGS) IMPLANT
SUT PROLENE 4 0 PS 2 18 (SUTURE) IMPLANT
SUT VIC AB 2-0 CT2 27 (SUTURE) IMPLANT
SUT VIC AB 4-0 PS2 27 (SUTURE) ×2 IMPLANT
SUT VICRYL AB 3-0 FS1 BRD 27IN (SUTURE) ×2 IMPLANT
SYR CONTROL 10ML LL (SYRINGE) ×6 IMPLANT

## 2012-01-29 NOTE — Transfer of Care (Signed)
  Anesthesia Post-op Note  Patient: Leah Lynch  Procedure(s) Performed: Procedure(s) (LRB) with comments: CAPSULOTOMY (Left) BUNIONECTOMY (Left) - Possible Reverse [name] Bunionectomy Left Foot  Patient Location: PACU  Anesthesia Type: General  Level of Consciousness: awake, alert , oriented and patient cooperative  Airway and Oxygen Therapy: Patient Spontanous Breathing and Patient connected to face mask oxygen  Post-op Pain: mild  Post-op Assessment: Post-op Vital signs reviewed, Patient's Cardiovascular Status Stable, Respiratory Function Stable, Patent Airway and No signs of Nausea or vomiting  Post-op Vital Signs: Reviewed and stable  Complications: No apparent anesthesia complications

## 2012-01-29 NOTE — Anesthesia Preprocedure Evaluation (Signed)
Anesthesia Evaluation  Patient identified by MRN, date of birth, ID band Patient awake    Reviewed: Allergy & Precautions, H&P , NPO status , Patient's Chart, lab work & pertinent test results  History of Anesthesia Complications Negative for: history of anesthetic complications  Airway Mallampati: II    Mouth opening: Limited Mouth Opening  Dental  (+) Teeth Intact   Pulmonary COPDRecent URI , Resolved,    Pulmonary exam normal       Cardiovascular hypertension, Pt. on medications Rhythm:Regular Rate:Normal     Neuro/Psych    GI/Hepatic GERD-  Medicated and Controlled,  Endo/Other    Renal/GU      Musculoskeletal  (+) Fibromyalgia -  Abdominal   Peds  Hematology   Anesthesia Other Findings   Reproductive/Obstetrics                           Anesthesia Physical Anesthesia Plan  ASA: II  Anesthesia Plan: MAC   Post-op Pain Management:    Induction: Intravenous  Airway Management Planned: Nasal Cannula  Additional Equipment:   Intra-op Plan:   Post-operative Plan:   Informed Consent: I have reviewed the patients History and Physical, chart, labs and discussed the procedure including the risks, benefits and alternatives for the proposed anesthesia with the patient or authorized representative who has indicated his/her understanding and acceptance.     Plan Discussed with:   Anesthesia Plan Comments:         Anesthesia Quick Evaluation

## 2012-01-29 NOTE — H&P (Signed)
HISTORY AND PHYSICAL INTERVAL NOTE:  01/29/2012  11:25 AM  Leah Lynch  has presented today for surgery, with the diagnosis of hallux varus left foot.  The various methods of treatment have been discussed with the patient.  No guarantees were given.  After consideration of risks, benefits and other options for treatment, the patient has consented to surgery.  I have reviewed the patients' chart and labs.    Patient Vitals for the past 24 hrs:  BP Temp Temp src Pulse Resp SpO2  01/29/12 1120 - - - - - 91 %  01/29/12 1115 - - - - - 90 %  01/29/12 1110 - - - - - 91 %  01/29/12 1100 - - - - - 90 %  01/29/12 1055 107/63 mmHg - - - 19  92 %  01/29/12 1050 103/62 mmHg - - - 36  94 %  01/29/12 1045 112/63 mmHg - - - 14  94 %  01/29/12 1040 118/61 mmHg - - - 17  95 %  01/29/12 1035 108/68 mmHg - - - 12  93 %  01/29/12 1030 115/66 mmHg - - - 15  93 %  01/29/12 1025 114/55 mmHg - - - 17  95 %  01/29/12 1020 114/55 mmHg - - - 26  94 %  01/29/12 1017 114/55 mmHg 97.6 F (36.4 C) Oral 95  18  94 %    A history and physical examination was performed in my office.  The patient was reexamined.  There have been no changes to this history and physical examination.  Dallas Schimke, DPM

## 2012-01-29 NOTE — Anesthesia Postprocedure Evaluation (Signed)
  Anesthesia Post-op Note  Patient: Leah Lynch  Procedure(s) Performed: Procedure(s) (LRB) with comments: CAPSULOTOMY (Left)  Patient Location: PACU  Anesthesia Type: MAC  Level of Consciousness: awake, alert , oriented and patient cooperative  Airway and Oxygen Therapy: Patient Spontanous Breathing room air  Post-op Pain: mild  Post-op Assessment: Post-op Vital signs reviewed, Patient's Cardiovascular Status Stable, Respiratory Function Stable, Patent Airway and No signs of Nausea or vomiting  Post-op Vital Signs: Reviewed and stable  Complications: No apparent anesthesia complications

## 2012-01-29 NOTE — Transfer of Care (Signed)
  Anesthesia Post-op Note  Patient: Leah Lynch  Procedure(s) Performed: Procedure(s) (LRB) with comments: CAPSULOTOMY (Left)  Patient Location: PACU  Anesthesia Type: MAC  Level of Consciousness: awake, alert , oriented and patient cooperative  Airway and Oxygen Therapy: Patient Spontanous Breathing on room air  Post-op Pain: mild  Post-op Assessment: Post-op Vital signs reviewed, Patient's Cardiovascular Status Stable, Respiratory Function Stable, Patent Airway and No signs of Nausea or vomiting  Post-op Vital Signs: Reviewed and stable  Complications: No apparent anesthesia complications

## 2012-01-29 NOTE — Op Note (Addendum)
OPERATIVE REPORT   SURGEON:   Dallas Schimke, DPM  OR STAFF:   Cyndie Chime, RN - Circulator Sherri Sear, CST - Scrub Person Lizabeth Leyden, RN - RN First Assistant Hurshel Party, CST - Relief Scrub Larwance Rote Protzek, RN - Circulator   PREOPERATIVE DIAGNOSIS:   1.  Hallux varus left foot 2.  Retained hardware  POSTOPERATIVE DIAGNOSIS: Same  PROCEDURE: 1.  Correction of hallux varus left foot via capsulotomy and transfer of the extensor hallucis brevis tendon and a portion of the extensor hallucis longus tendon. 2.  Removal of retained hardware left foot  ANESTHESIA:  Monitor Anesthesia Care   HEMOSTASIS:   Pneumatic ankle tourniquet set at 250 mmHg  ESTIMATED BLOOD LOSS:   Minimal (<5 cc)  MATERIALS USED:  None.  INJECTABLES: 0.5% Marcaine plain and 1% Xylocaine plain  PATHOLOGY:   None  COMPLICATIONS:   None  INDICATIONS:  Symptomatic hallux varus deformity of the left foot following bunionectomy procedure.  The position of the great toe made it difficult for her to wear certain shoes.  DESCRIPTION OF THE PROCEDURE:   The patient was brought to the operating room and placed on the operative table in the supine position.  A pneumatic ankle tourniquet was applied to the patient's ankle.  Following sedation, the surgical site was anesthetized with 0.5% Marcaine plain.  The foot was then prepped, scrubbed, and draped in the usual sterile technique.  The foot was elevated, exsanguinated and the pneumatic ankle tourniquet inflated to 250 mmHg.    Attention was directed to the dorsal medial aspect of the left foot where the previous surgical scar was noted overlying the first MPJ.  2 converging semi-elliptical incisions were made encompassing the incision.  This wedge of skin was removed and passed from the operative field.  Dissection was continued deep down to the level of the first MPJ.  Significant adhesions were noted medially.  The extensor  hallucis longus tendon was found to be adherent to the underlying joint capsule.  The extensor hallucis longus tendon was freed from the underlying joint capsule.  A V to Y capsulotomy was performed along the medial aspect of the first MPJ.  Persistent medial deviation of the toe was noted with the foot loaded.  Additional local anesthesia was administered in the form of 1% Xylocaine plain.  A McGlamry elevator was used to free up further adhesions plantarly and medially.  The FHL tendon was identified and found to be free of tear or laceration.  Persistent medial deviation of the toe was again noted with the foot loaded.  The 3.0 mm Synthes cannulated screw in the distal first metatarsal was removed.  The 3.0 mm Synthes cannulated screw in the proximal phalanx was removed.  The extensor hallucis brevis tendon was identified and transferred to the distal medial aspect of the first MPJ capsule.  Due to the small caliber of this tendon it was determined that additional transfer would be needed.  The medial one half of the extensor hallucis longest tendon was detached from its insertion onto the base of the distal phalanx.  It was transferred to the medial aspect of the first MPJ.  The medial deviation of the hallux was found to be reduced with the foot loaded following this transfer.     The area was copiously flushed with irrigant. The periosteal and capsular tissues were approximated with Vicryl.  The skin was reapproximated with Vicryl.  The incision site was reinforced  with Steri-Strips.  A sterile compressive dressing was applied to the operative foot.  The patient's ankle tourniquet was deflated.  A prompt hyperemic response was noted to all digits of the operative foot.    The patient tolerated the procedure well.  The patient was then transferred to PACU with vital signs stable and vascular status intact to all toes of the operative foot.  Following a period of postoperative monitoring, the patient will be  discharged home.

## 2012-01-29 NOTE — Brief Op Note (Signed)
BRIEF OPERATIVE NOTE  SURGEON:   Dallas Schimke, DPM  OR STAFF:   Cyndie Chime, RN - Circulator Sherri Sear, CST - Scrub Person Lizabeth Leyden, RN - RN First Assistant Hurshel Party, CST - Relief Scrub Larwance Rote Protzek, RN - Circulator   PREOPERATIVE DIAGNOSIS:   1.  Hallux varus left foot 2.  Retained hardware  POSTOPERATIVE DIAGNOSIS: Same  PROCEDURE: 1.  Correction of hallux varus left foot via capsulotomy and transfer of the extensor hallucis brevis tendon and a portion of the extensor hallucis longus tendon. 2.  Removal of retained hardware left foot  ANESTHESIA:  Monitor Anesthesia Care   HEMOSTASIS:   Pneumatic ankle tourniquet set at 250 mmHg  ESTIMATED BLOOD LOSS:   Minimal (<5 cc)  MATERIALS USED:  None.  INJECTABLES: 0.5% Marcaine plain and 1% Xylocaine plain  PATHOLOGY:   None  COMPLICATIONS:   None  INDICATIONS:  Symptomatic hallux varus deformity of the left foot following bunionectomy procedure.  The position of the great toe made it difficult for her to wear certain shoes.  DICTATION:  Note written in EPIC

## 2012-01-29 NOTE — Preoperative (Signed)
Beta Blockers   Reason not to administer Beta Blockers:Not Applicable 

## 2012-02-02 ENCOUNTER — Encounter (HOSPITAL_COMMUNITY): Payer: Self-pay | Admitting: Podiatry

## 2012-08-16 ENCOUNTER — Other Ambulatory Visit (HOSPITAL_COMMUNITY): Payer: Self-pay | Admitting: Urology

## 2012-08-16 DIAGNOSIS — N390 Urinary tract infection, site not specified: Secondary | ICD-10-CM

## 2012-08-19 ENCOUNTER — Ambulatory Visit (HOSPITAL_COMMUNITY)
Admission: RE | Admit: 2012-08-19 | Discharge: 2012-08-19 | Disposition: A | Discharge status: Home / Self Care | Payer: PRIVATE HEALTH INSURANCE | Source: Ambulatory Visit | Attending: Urology | Admitting: Urology

## 2012-08-19 DIAGNOSIS — N3289 Other specified disorders of bladder: Secondary | ICD-10-CM | POA: Insufficient documentation

## 2012-08-19 DIAGNOSIS — N390 Urinary tract infection, site not specified: Secondary | ICD-10-CM | POA: Insufficient documentation

## 2013-01-04 ENCOUNTER — Other Ambulatory Visit (HOSPITAL_COMMUNITY): Payer: Self-pay | Admitting: Family Medicine

## 2013-01-04 DIAGNOSIS — Z139 Encounter for screening, unspecified: Secondary | ICD-10-CM

## 2013-01-07 ENCOUNTER — Ambulatory Visit (HOSPITAL_COMMUNITY)
Admission: RE | Admit: 2013-01-07 | Discharge: 2013-01-07 | Disposition: A | Discharge status: Home / Self Care | Payer: PRIVATE HEALTH INSURANCE | Source: Ambulatory Visit | Attending: Family Medicine | Admitting: Family Medicine

## 2013-01-07 DIAGNOSIS — Z1231 Encounter for screening mammogram for malignant neoplasm of breast: Secondary | ICD-10-CM | POA: Insufficient documentation

## 2013-01-07 DIAGNOSIS — Z139 Encounter for screening, unspecified: Secondary | ICD-10-CM

## 2013-04-05 ENCOUNTER — Other Ambulatory Visit (HOSPITAL_COMMUNITY): Payer: Self-pay | Admitting: Family Medicine

## 2013-04-05 ENCOUNTER — Ambulatory Visit (HOSPITAL_COMMUNITY)
Admission: RE | Admit: 2013-04-05 | Discharge: 2013-04-05 | Disposition: A | Discharge status: Home / Self Care | Payer: PRIVATE HEALTH INSURANCE | Source: Ambulatory Visit | Attending: Family Medicine | Admitting: Family Medicine

## 2013-04-05 DIAGNOSIS — M25519 Pain in unspecified shoulder: Secondary | ICD-10-CM | POA: Insufficient documentation

## 2013-04-05 DIAGNOSIS — M19019 Primary osteoarthritis, unspecified shoulder: Secondary | ICD-10-CM | POA: Insufficient documentation

## 2013-07-21 ENCOUNTER — Ambulatory Visit (HOSPITAL_COMMUNITY)
Admission: RE | Admit: 2013-07-21 | Discharge: 2013-07-21 | Disposition: A | Discharge status: Home / Self Care | Payer: PRIVATE HEALTH INSURANCE | Source: Ambulatory Visit | Attending: Family Medicine | Admitting: Family Medicine

## 2013-07-21 ENCOUNTER — Other Ambulatory Visit (HOSPITAL_COMMUNITY): Payer: Self-pay | Admitting: Family Medicine

## 2013-07-21 DIAGNOSIS — M545 Low back pain, unspecified: Secondary | ICD-10-CM | POA: Diagnosis present

## 2013-07-21 DIAGNOSIS — M47817 Spondylosis without myelopathy or radiculopathy, lumbosacral region: Secondary | ICD-10-CM | POA: Insufficient documentation

## 2013-07-21 DIAGNOSIS — M51379 Other intervertebral disc degeneration, lumbosacral region without mention of lumbar back pain or lower extremity pain: Secondary | ICD-10-CM | POA: Insufficient documentation

## 2013-07-21 DIAGNOSIS — M5137 Other intervertebral disc degeneration, lumbosacral region: Secondary | ICD-10-CM | POA: Diagnosis not present

## 2014-02-07 ENCOUNTER — Other Ambulatory Visit (HOSPITAL_COMMUNITY): Payer: Self-pay | Admitting: Family Medicine

## 2014-02-07 DIAGNOSIS — M545 Low back pain, unspecified: Secondary | ICD-10-CM

## 2014-02-13 ENCOUNTER — Encounter (HOSPITAL_COMMUNITY): Payer: Self-pay

## 2014-02-13 ENCOUNTER — Ambulatory Visit (HOSPITAL_COMMUNITY)
Admission: RE | Admit: 2014-02-13 | Discharge: 2014-02-13 | Disposition: A | Discharge status: Home / Self Care | Payer: PRIVATE HEALTH INSURANCE | Source: Ambulatory Visit | Attending: Family Medicine | Admitting: Family Medicine

## 2014-02-13 ENCOUNTER — Other Ambulatory Visit (HOSPITAL_COMMUNITY): Payer: Self-pay | Admitting: Family Medicine

## 2014-02-13 DIAGNOSIS — M25562 Pain in left knee: Secondary | ICD-10-CM

## 2014-02-13 DIAGNOSIS — M545 Low back pain, unspecified: Secondary | ICD-10-CM

## 2014-03-22 ENCOUNTER — Other Ambulatory Visit: Payer: Self-pay | Admitting: Family Medicine

## 2014-03-22 DIAGNOSIS — M545 Low back pain: Secondary | ICD-10-CM

## 2014-03-23 ENCOUNTER — Ambulatory Visit
Admission: RE | Admit: 2014-03-23 | Discharge: 2014-03-23 | Disposition: A | Discharge status: Home / Self Care | Payer: PRIVATE HEALTH INSURANCE | Source: Ambulatory Visit | Attending: Family Medicine | Admitting: Family Medicine

## 2014-03-23 DIAGNOSIS — M545 Low back pain: Secondary | ICD-10-CM

## 2014-03-23 MED ORDER — METHYLPREDNISOLONE ACETATE 40 MG/ML INJ SUSP (RADIOLOG
120.0000 mg | Freq: Once | INTRAMUSCULAR | Status: AC
  Administered 2014-03-23: 120 mg via EPIDURAL

## 2014-03-23 MED ORDER — IOHEXOL 180 MG/ML  SOLN
1.0000 mL | Freq: Once | INTRAMUSCULAR | Status: AC | PRN
  Administered 2014-03-23: 1 mL via EPIDURAL

## 2014-03-23 NOTE — Discharge Instructions (Signed)

## 2014-05-05 ENCOUNTER — Other Ambulatory Visit (HOSPITAL_COMMUNITY): Payer: Self-pay | Admitting: Family Medicine

## 2014-05-05 DIAGNOSIS — R1011 Right upper quadrant pain: Secondary | ICD-10-CM

## 2014-05-12 ENCOUNTER — Ambulatory Visit (HOSPITAL_COMMUNITY)
Admission: RE | Admit: 2014-05-12 | Discharge: 2014-05-12 | Disposition: A | Discharge status: Home / Self Care | Payer: Medicare Other | Source: Ambulatory Visit | Attending: Family Medicine | Admitting: Family Medicine

## 2014-05-12 DIAGNOSIS — R1011 Right upper quadrant pain: Secondary | ICD-10-CM | POA: Diagnosis present

## 2014-05-30 ENCOUNTER — Other Ambulatory Visit (HOSPITAL_COMMUNITY): Payer: Self-pay | Admitting: Family Medicine

## 2014-05-30 DIAGNOSIS — R110 Nausea: Secondary | ICD-10-CM

## 2014-05-30 DIAGNOSIS — R1011 Right upper quadrant pain: Secondary | ICD-10-CM

## 2014-06-05 ENCOUNTER — Ambulatory Visit (HOSPITAL_COMMUNITY): Payer: Medicare Other

## 2014-06-08 ENCOUNTER — Other Ambulatory Visit (HOSPITAL_COMMUNITY): Payer: Medicare Other

## 2014-06-09 ENCOUNTER — Encounter (HOSPITAL_COMMUNITY)
Admission: RE | Admit: 2014-06-09 | Discharge: 2014-06-09 | Disposition: A | Discharge status: Home / Self Care | Payer: Medicare Other | Source: Ambulatory Visit | Attending: Family Medicine | Admitting: Family Medicine

## 2014-06-09 ENCOUNTER — Encounter (HOSPITAL_COMMUNITY): Payer: Self-pay

## 2014-06-09 DIAGNOSIS — R110 Nausea: Secondary | ICD-10-CM | POA: Insufficient documentation

## 2014-06-09 DIAGNOSIS — R1011 Right upper quadrant pain: Secondary | ICD-10-CM | POA: Diagnosis present

## 2014-06-09 MED ORDER — SODIUM CHLORIDE 0.9 % IJ SOLN
INTRAMUSCULAR | Status: AC
  Filled 2014-06-09: qty 24

## 2014-06-09 MED ORDER — STERILE WATER FOR INJECTION IJ SOLN
INTRAMUSCULAR | Status: AC
  Administered 2014-06-09: 1.41 mL via INTRAVENOUS
  Filled 2014-06-09: qty 10

## 2014-06-09 MED ORDER — TECHNETIUM TC 99M MEBROFENIN IV KIT
5.0000 | PACK | Freq: Once | INTRAVENOUS | Status: AC | PRN
  Administered 2014-06-09: 5 via INTRAVENOUS

## 2014-06-09 MED ORDER — SINCALIDE 5 MCG IJ SOLR
INTRAMUSCULAR | Status: AC
  Administered 2014-06-09: 1.41 ug via INTRAVENOUS
  Filled 2014-06-09: qty 5

## 2014-07-14 ENCOUNTER — Other Ambulatory Visit: Payer: Self-pay | Admitting: Family Medicine

## 2014-07-14 DIAGNOSIS — M545 Low back pain, unspecified: Secondary | ICD-10-CM

## 2014-07-19 ENCOUNTER — Ambulatory Visit
Admission: RE | Admit: 2014-07-19 | Discharge: 2014-07-19 | Disposition: A | Discharge status: Home / Self Care | Payer: Medicare Other | Source: Ambulatory Visit | Attending: Family Medicine | Admitting: Family Medicine

## 2014-07-19 VITALS — BP 132/62 | HR 68

## 2014-07-19 DIAGNOSIS — M545 Low back pain, unspecified: Secondary | ICD-10-CM

## 2014-07-19 DIAGNOSIS — M5137 Other intervertebral disc degeneration, lumbosacral region: Secondary | ICD-10-CM

## 2014-07-19 MED ORDER — METHYLPREDNISOLONE ACETATE 40 MG/ML INJ SUSP (RADIOLOG
120.0000 mg | Freq: Once | INTRAMUSCULAR | Status: AC
  Administered 2014-07-19: 120 mg via EPIDURAL

## 2014-07-19 MED ORDER — IOHEXOL 180 MG/ML  SOLN
1.0000 mL | Freq: Once | INTRAMUSCULAR | Status: AC | PRN
  Administered 2014-07-19: 1 mL via EPIDURAL

## 2014-08-21 ENCOUNTER — Ambulatory Visit: Payer: Medicare Other | Admitting: Nurse Practitioner

## 2015-02-02 ENCOUNTER — Other Ambulatory Visit (HOSPITAL_COMMUNITY): Payer: Self-pay | Admitting: Family Medicine

## 2015-02-02 DIAGNOSIS — Z1231 Encounter for screening mammogram for malignant neoplasm of breast: Secondary | ICD-10-CM

## 2015-02-14 ENCOUNTER — Ambulatory Visit (HOSPITAL_COMMUNITY)
Admission: RE | Admit: 2015-02-14 | Discharge: 2015-02-14 | Disposition: A | Discharge status: Home / Self Care | Payer: Medicare Other | Source: Ambulatory Visit | Attending: Family Medicine | Admitting: Family Medicine

## 2015-02-14 DIAGNOSIS — Z1231 Encounter for screening mammogram for malignant neoplasm of breast: Secondary | ICD-10-CM | POA: Insufficient documentation

## 2015-08-02 NOTE — Patient Instructions (Signed)
Your procedure is scheduled on:  08/09/2015  Report to [facility] at   750  AM.  Call this number if you have problems the morning of surgery: [phone]   Do not eat food or drink liquids :After Midnight.      Take these medicines the morning of surgery with A SIP OF WATER: atenolol, celebrex, hydrocodone.   Do not wear jewelry, make-up or nail polish.  Do not wear lotions, powders, or perfumes. You may wear deodorant.  Do not shave 48 hours prior to surgery.  Do not bring valuables to the hospital.  Contacts, dentures or bridgework may not be worn into surgery.  Leave suitcase in the car. After surgery it may be brought to your room.  For patients admitted to the hospital, checkout time is 11:00 AM the day of discharge.   Patients discharged the day of surgery will not be allowed to drive home.  :     Please read over the following fact sheets that you were given: Coughing and Deep Breathing, Surgical Site Infection Prevention, Anesthesia Post-op Instructions and Care and Recovery After Surgery    Cataract A cataract is a clouding of the lens of the eye. When a lens becomes cloudy, vision is reduced based on the degree and nature of the clouding. Many cataracts reduce vision to some degree. Some cataracts make people more near-sighted as they develop. Other cataracts increase glare. Cataracts that are ignored and become worse can sometimes look white. The white color can be seen through the pupil. CAUSES   Aging. However, cataracts may occur at any age, even in newborns.   Certain drugs.   Trauma to the eye.   Certain diseases such as diabetes.   Specific eye diseases such as chronic inflammation inside the eye or a sudden attack of a rare form of glaucoma.   Inherited or acquired medical problems.  SYMPTOMS   Gradual, progressive drop in vision in the affected eye.   Severe, rapid visual loss. This most often happens when trauma is the cause.  DIAGNOSIS  To detect a  cataract, an eye doctor examines the lens. Cataracts are best diagnosed with an exam of the eyes with the pupils enlarged (dilated) by drops.  TREATMENT  For an early cataract, vision may improve by using different eyeglasses or stronger lighting. If that does not help your vision, surgery is the only effective treatment. A cataract needs to be surgically removed when vision loss interferes with your everyday activities, such as driving, reading, or watching TV. A cataract may also have to be removed if it prevents examination or treatment of another eye problem. Surgery removes the cloudy lens and usually replaces it with a substitute lens (intraocular lens, IOL).  At a time when both you and your doctor agree, the cataract will be surgically removed. If you have cataracts in both eyes, only one is usually removed at a time. This allows the operated eye to heal and be out of danger from any possible problems after surgery (such as infection or poor wound healing). In rare cases, a cataract may be doing damage to your eye. In these cases, your caregiver may advise surgical removal right away. The vast majority of people who have cataract surgery have better vision afterward. HOME CARE INSTRUCTIONS  If you are not planning surgery, you may be asked to do the following:  Use different eyeglasses.   Use stronger or brighter lighting.   Ask your eye doctor about reducing  your medicine dose or changing medicines if it is thought that a medicine caused your cataract. Changing medicines does not make the cataract go away on its own.   Become familiar with your surroundings. Poor vision can lead to injury. Avoid bumping into things on the affected side. You are at a higher risk for tripping or falling.   Exercise extreme care when driving or operating machinery.   Wear sunglasses if you are sensitive to bright light or experiencing problems with glare.  SEEK IMMEDIATE MEDICAL CARE IF:   You have a  worsening or sudden vision loss.   You notice redness, swelling, or increasing pain in the eye.   You have a fever.  Document Released: 03/03/2005 Document Revised: 02/20/2011 Document Reviewed: 10/25/2010 [facility] Patient Information 2012 [place].PATIENT INSTRUCTIONS POST-ANESTHESIA  IMMEDIATELY FOLLOWING SURGERY:  Do not drive or operate machinery for the first twenty four hours after surgery.  Do not make any important decisions for twenty four hours after surgery or while taking narcotic pain medications or sedatives.  If you develop intractable nausea and vomiting or a severe headache please notify your doctor immediately.  FOLLOW-UP:  Please make an appointment with your surgeon as instructed. You do not need to follow up with anesthesia unless specifically instructed to do so.  WOUND CARE INSTRUCTIONS (if applicable):  Keep a dry clean dressing on the anesthesia/puncture wound site if there is drainage.  Once the wound has quit draining you may leave it open to air.  Generally you should leave the bandage intact for twenty four hours unless there is drainage.  If the epidural site drains for more than 36-48 hours please call the anesthesia department.  QUESTIONS?:  Please feel free to call your physician or the hospital operator if you have any questions, and they will be happy to assist you.

## 2015-08-06 ENCOUNTER — Other Ambulatory Visit: Payer: Self-pay

## 2015-08-06 ENCOUNTER — Encounter (HOSPITAL_COMMUNITY): Payer: Self-pay

## 2015-08-06 ENCOUNTER — Encounter (HOSPITAL_COMMUNITY)
Admission: RE | Admit: 2015-08-06 | Discharge: 2015-08-06 | Disposition: A | Discharge status: Home / Self Care | Payer: Medicare Other | Source: Ambulatory Visit | Attending: Ophthalmology | Admitting: Ophthalmology

## 2015-08-06 DIAGNOSIS — K219 Gastro-esophageal reflux disease without esophagitis: Secondary | ICD-10-CM | POA: Diagnosis not present

## 2015-08-06 DIAGNOSIS — M797 Fibromyalgia: Secondary | ICD-10-CM | POA: Diagnosis not present

## 2015-08-06 DIAGNOSIS — I10 Essential (primary) hypertension: Secondary | ICD-10-CM | POA: Diagnosis not present

## 2015-08-06 DIAGNOSIS — H268 Other specified cataract: Secondary | ICD-10-CM | POA: Diagnosis present

## 2015-08-06 DIAGNOSIS — M1991 Primary osteoarthritis, unspecified site: Secondary | ICD-10-CM | POA: Diagnosis not present

## 2015-08-06 DIAGNOSIS — Z01810 Encounter for preprocedural cardiovascular examination: Secondary | ICD-10-CM | POA: Diagnosis not present

## 2015-08-06 DIAGNOSIS — Z01812 Encounter for preprocedural laboratory examination: Secondary | ICD-10-CM | POA: Diagnosis not present

## 2015-08-06 DIAGNOSIS — F17200 Nicotine dependence, unspecified, uncomplicated: Secondary | ICD-10-CM | POA: Diagnosis not present

## 2015-08-06 DIAGNOSIS — Z79899 Other long term (current) drug therapy: Secondary | ICD-10-CM | POA: Diagnosis not present

## 2015-08-06 HISTORY — DX: Reserved for inherently not codable concepts without codable children: IMO0001

## 2015-08-06 LAB — BASIC METABOLIC PANEL
Anion gap: 7 (ref 5–15)
BUN: 21 mg/dL — AB (ref 6–20)
CALCIUM: 9 mg/dL (ref 8.9–10.3)
CHLORIDE: 102 mmol/L (ref 101–111)
CO2: 29 mmol/L (ref 22–32)
CREATININE: 0.54 mg/dL (ref 0.44–1.00)
GFR calc non Af Amer: >60 mL/min (ref >60)
GLUCOSE: 87 mg/dL (ref 65–99)
Potassium: 3.4 mmol/L — ABNORMAL LOW (ref 3.5–5.1)
Sodium: 138 mmol/L (ref 135–145)

## 2015-08-06 LAB — CBC WITH DIFFERENTIAL/PLATELET
BASOS PCT: 0 %
Basophils Absolute: 0 10*3/uL (ref 0.0–0.1)
Eosinophils Absolute: 0.5 10*3/uL (ref 0.0–0.7)
Eosinophils Relative: 4 %
HEMATOCRIT: 40.1 % (ref 36.0–46.0)
Hemoglobin: 12.9 g/dL (ref 12.0–15.0)
Lymphocytes Relative: 42 %
Lymphs Abs: 4.6 10*3/uL — ABNORMAL HIGH (ref 0.7–4.0)
MCH: 27.6 pg (ref 26.0–34.0)
MCHC: 32.2 g/dL (ref 30.0–36.0)
MCV: 85.7 fL (ref 78.0–100.0)
MONO ABS: 0.7 10*3/uL (ref 0.1–1.0)
MONOS PCT: 6 %
NEUTROS ABS: 5.1 10*3/uL (ref 1.7–7.7)
Neutrophils Relative %: 48 %
Platelets: 309 10*3/uL (ref 150–400)
RBC: 4.68 MIL/uL (ref 3.87–5.11)
RDW: 15.4 % (ref 11.5–15.5)
WBC: 10.8 10*3/uL — ABNORMAL HIGH (ref 4.0–10.5)

## 2015-08-07 MED ORDER — TETRACAINE HCL 0.5 % OP SOLN
OPHTHALMIC | Status: AC
  Filled 2015-08-07: qty 4

## 2015-08-07 MED ORDER — CYCLOPENTOLATE-PHENYLEPHRINE OP SOLN OPTIME - NO CHARGE
OPHTHALMIC | Status: AC
  Filled 2015-08-07: qty 2

## 2015-08-07 MED ORDER — LIDOCAINE HCL 3.5 % OP GEL
OPHTHALMIC | Status: AC
  Filled 2015-08-07: qty 1

## 2015-08-07 MED ORDER — PHENYLEPHRINE HCL 2.5 % OP SOLN
OPHTHALMIC | Status: AC
  Filled 2015-08-07: qty 15

## 2015-08-07 MED ORDER — NEOMYCIN-POLYMYXIN-DEXAMETH 3.5-10000-0.1 OP SUSP
OPHTHALMIC | Status: AC
  Filled 2015-08-07: qty 5

## 2015-08-09 ENCOUNTER — Ambulatory Visit (HOSPITAL_COMMUNITY)
Admission: RE | Admit: 2015-08-09 | Discharge: 2015-08-09 | Disposition: A | Discharge status: Home / Self Care | Payer: Medicare Other | Source: Ambulatory Visit | Attending: Ophthalmology | Admitting: Ophthalmology

## 2015-08-09 ENCOUNTER — Encounter (HOSPITAL_COMMUNITY)
Admission: RE | Disposition: A | Discharge status: Home / Self Care | Payer: Self-pay | Source: Ambulatory Visit | Attending: Ophthalmology

## 2015-08-09 ENCOUNTER — Ambulatory Visit (HOSPITAL_COMMUNITY): Payer: Medicare Other | Admitting: Anesthesiology

## 2015-08-09 ENCOUNTER — Encounter (HOSPITAL_COMMUNITY): Payer: Self-pay | Admitting: *Deleted

## 2015-08-09 DIAGNOSIS — Z01810 Encounter for preprocedural cardiovascular examination: Secondary | ICD-10-CM | POA: Insufficient documentation

## 2015-08-09 DIAGNOSIS — I10 Essential (primary) hypertension: Secondary | ICD-10-CM | POA: Diagnosis not present

## 2015-08-09 DIAGNOSIS — H268 Other specified cataract: Secondary | ICD-10-CM | POA: Insufficient documentation

## 2015-08-09 DIAGNOSIS — K219 Gastro-esophageal reflux disease without esophagitis: Secondary | ICD-10-CM | POA: Insufficient documentation

## 2015-08-09 DIAGNOSIS — F17200 Nicotine dependence, unspecified, uncomplicated: Secondary | ICD-10-CM | POA: Insufficient documentation

## 2015-08-09 DIAGNOSIS — Z01812 Encounter for preprocedural laboratory examination: Secondary | ICD-10-CM | POA: Diagnosis not present

## 2015-08-09 DIAGNOSIS — Z79899 Other long term (current) drug therapy: Secondary | ICD-10-CM | POA: Insufficient documentation

## 2015-08-09 DIAGNOSIS — M1991 Primary osteoarthritis, unspecified site: Secondary | ICD-10-CM | POA: Insufficient documentation

## 2015-08-09 DIAGNOSIS — M797 Fibromyalgia: Secondary | ICD-10-CM | POA: Insufficient documentation

## 2015-08-09 HISTORY — PX: CATARACT EXTRACTION W/PHACO: SHX586

## 2015-08-09 SURGERY — PHACOEMULSIFICATION, CATARACT, WITH IOL INSERTION
Anesthesia: Monitor Anesthesia Care | Site: Eye | Laterality: Right

## 2015-08-09 MED ORDER — LIDOCAINE HCL (PF) 1 % IJ SOLN
INTRAMUSCULAR | Status: DC | PRN
  Administered 2015-08-09: .4 mL

## 2015-08-09 MED ORDER — FENTANYL CITRATE (PF) 100 MCG/2ML IJ SOLN
25.0000 ug | INTRAMUSCULAR | Status: AC
  Administered 2015-08-09 (×2): 25 ug via INTRAVENOUS

## 2015-08-09 MED ORDER — LIDOCAINE HCL 3.5 % OP GEL
1.0000 "application " | Freq: Once | OPHTHALMIC | Status: AC
  Administered 2015-08-09: 1 via OPHTHALMIC

## 2015-08-09 MED ORDER — NEOMYCIN-POLYMYXIN-DEXAMETH 3.5-10000-0.1 OP SUSP
OPHTHALMIC | Status: DC | PRN
  Administered 2015-08-09: 2 [drp] via OPHTHALMIC

## 2015-08-09 MED ORDER — MIDAZOLAM HCL 2 MG/2ML IJ SOLN
INTRAMUSCULAR | Status: AC
  Filled 2015-08-09: qty 2

## 2015-08-09 MED ORDER — EPINEPHRINE HCL 1 MG/ML IJ SOLN
INTRAMUSCULAR | Status: AC
  Filled 2015-08-09: qty 1

## 2015-08-09 MED ORDER — TETRACAINE HCL 0.5 % OP SOLN
1.0000 [drp] | OPHTHALMIC | Status: AC
  Administered 2015-08-09 (×3): 1 [drp] via OPHTHALMIC

## 2015-08-09 MED ORDER — PROVISC 10 MG/ML IO SOLN
INTRAOCULAR | Status: DC | PRN
  Administered 2015-08-09: 0.85 mL via INTRAOCULAR

## 2015-08-09 MED ORDER — BSS IO SOLN
INTRAOCULAR | Status: DC | PRN
  Administered 2015-08-09: 15 mL

## 2015-08-09 MED ORDER — FENTANYL CITRATE (PF) 100 MCG/2ML IJ SOLN
INTRAMUSCULAR | Status: AC
  Filled 2015-08-09: qty 2

## 2015-08-09 MED ORDER — LACTATED RINGERS IV SOLN
INTRAVENOUS | Status: DC
  Administered 2015-08-09: 1000 mL via INTRAVENOUS

## 2015-08-09 MED ORDER — POVIDONE-IODINE 5 % OP SOLN
OPHTHALMIC | Status: DC | PRN
  Administered 2015-08-09: 1 via OPHTHALMIC

## 2015-08-09 MED ORDER — MIDAZOLAM HCL 2 MG/2ML IJ SOLN
1.0000 mg | INTRAMUSCULAR | Status: DC | PRN
  Administered 2015-08-09: 2 mg via INTRAVENOUS

## 2015-08-09 MED ORDER — CYCLOPENTOLATE-PHENYLEPHRINE 0.2-1 % OP SOLN
1.0000 [drp] | OPHTHALMIC | Status: AC
  Administered 2015-08-09 (×3): 1 [drp] via OPHTHALMIC

## 2015-08-09 MED ORDER — EPINEPHRINE HCL 1 MG/ML IJ SOLN
INTRAOCULAR | Status: DC | PRN
  Administered 2015-08-09: 500 mL

## 2015-08-09 MED ORDER — MIDAZOLAM HCL 5 MG/5ML IJ SOLN
INTRAMUSCULAR | Status: DC | PRN
  Administered 2015-08-09: 1 mg via INTRAVENOUS

## 2015-08-09 MED ORDER — PHENYLEPHRINE HCL 2.5 % OP SOLN
1.0000 [drp] | OPHTHALMIC | Status: AC
  Administered 2015-08-09 (×3): 1 [drp] via OPHTHALMIC

## 2015-08-09 SURGICAL SUPPLY — 12 items
CLOTH BEACON ORANGE TIMEOUT ST (SAFETY) ×2 IMPLANT
EYE SHIELD UNIVERSAL CLEAR (GAUZE/BANDAGES/DRESSINGS) ×2 IMPLANT
GLOVE BIOGEL PI IND STRL 6.5 (GLOVE) ×1 IMPLANT
GLOVE BIOGEL PI INDICATOR 6.5 (GLOVE) ×1
GLOVE SKINSENSE NS SZ6.5 (GLOVE) ×1
GLOVE SKINSENSE STRL SZ6.5 (GLOVE) ×1 IMPLANT
PAD ARMBOARD 7.5X6 YLW CONV (MISCELLANEOUS) ×2 IMPLANT
SIGHTPATH CAT PROC W REG LENS (Ophthalmic Related) ×2 IMPLANT
SYRINGE LUER LOK 1CC (MISCELLANEOUS) ×2 IMPLANT
TAPE SURG TRANSPORE 1 IN (GAUZE/BANDAGES/DRESSINGS) ×1 IMPLANT
TAPE SURGICAL TRANSPORE 1 IN (GAUZE/BANDAGES/DRESSINGS) ×1
WATER STERILE IRR 250ML POUR (IV SOLUTION) ×2 IMPLANT

## 2015-08-09 NOTE — Anesthesia Postprocedure Evaluation (Signed)
Anesthesia Post Note  Patient: Leah Lynch  Procedure(s) Performed: Procedure(s) (LRB): CATARACT EXTRACTION PHACO AND INTRAOCULAR LENS PLACEMENT RIGHT EYE CDE=7.95 (Right)  Patient location during evaluation: Short Stay Anesthesia Type: MAC Level of consciousness: awake and alert Pain management: pain level controlled Vital Signs Assessment: post-procedure vital signs reviewed and stable Respiratory status: spontaneous breathing Cardiovascular status: blood pressure returned to baseline Postop Assessment: no signs of nausea or vomiting Anesthetic complications: no    Last Vitals:  Filed Vitals:   08/09/15 0905 08/09/15 0910  BP:    Pulse:    Temp:    Resp: 28 14    Last Pain: There were no vitals filed for this visit.               Theda Payer

## 2015-08-09 NOTE — Transfer of Care (Signed)
Immediate Anesthesia Transfer of Care Note  Patient: Leah Lynch  Procedure(s) Performed: Procedure(s): CATARACT EXTRACTION PHACO AND INTRAOCULAR LENS PLACEMENT RIGHT EYE CDE=7.95 (Right)  Patient Location: Short Stay  Anesthesia Type:MAC  Level of Consciousness: awake  Airway & Oxygen Therapy: Patient Spontanous Breathing  Post-op Assessment: Report given to RN  Post vital signs: Reviewed  Last Vitals:  Filed Vitals:   08/09/15 0905 08/09/15 0910  BP:    Pulse:    Temp:    Resp: 28 14    Last Pain: There were no vitals filed for this visit.    Patients Stated Pain Goal: 7 ([id number])  Complications: No apparent anesthesia complications

## 2015-08-09 NOTE — H&P (Signed)
I have reviewed the H&P, the patient was re-examined, and I have identified no interval changes in medical condition and plan of care since the history and physical of record  

## 2015-08-09 NOTE — Op Note (Signed)
I have reviewed the H&P, the patient was re-examined, and I have identified no interval changes in medical condition and plan of care since the history and physical of record  

## 2015-08-09 NOTE — Anesthesia Preprocedure Evaluation (Signed)
Anesthesia Evaluation  Patient identified by MRN, date of birth, ID band Patient awake    Reviewed: Allergy & Precautions, H&P , NPO status , Patient's Chart, lab work & pertinent test results  History of Anesthesia Complications Negative for: history of anesthetic complications  Airway Mallampati: II     Mouth opening: Limited Mouth Opening  Dental  (+) Teeth Intact   Pulmonary shortness of breath and with exertion, COPD, Current Smoker,    Pulmonary exam normal        Cardiovascular hypertension, Pt. on medications  Rhythm:Regular Rate:Normal     Neuro/Psych    GI/Hepatic GERD  Medicated and Controlled,  Endo/Other    Renal/GU      Musculoskeletal  (+) Fibromyalgia -  Abdominal   Peds  Hematology   Anesthesia Other Findings   Reproductive/Obstetrics                             Anesthesia Physical Anesthesia Plan  ASA: II  Anesthesia Plan: MAC   Post-op Pain Management:    Induction: Intravenous  Airway Management Planned: Nasal Cannula  Additional Equipment:   Intra-op Plan:   Post-operative Plan:   Informed Consent: I have reviewed the patients History and Physical, chart, labs and discussed the procedure including the risks, benefits and alternatives for the proposed anesthesia with the patient or authorized representative who has indicated his/her understanding and acceptance.     Plan Discussed with:   Anesthesia Plan Comments:         Anesthesia Quick Evaluation

## 2015-08-09 NOTE — Discharge Instructions (Signed)
Anesthesia, Adult, Care After °Refer to this sheet in the next few weeks. These instructions provide you with information on caring for yourself after your procedure. Your health care provider may also give you more specific instructions. Your treatment has been planned according to current medical practices, but problems sometimes occur. Call your health care provider if you have any problems or questions after your procedure. °WHAT TO EXPECT AFTER THE PROCEDURE °After the procedure, it is typical to experience: °· Sleepiness. °· Nausea and vomiting. °HOME CARE INSTRUCTIONS °· For the first 24 hours after general anesthesia: °¨ Have a responsible person with you. °¨ Do not drive a car. If you are alone, do not take public transportation. °¨ Do not drink alcohol. °¨ Do not take medicine that has not been prescribed by your health care provider. °¨ Do not sign important papers or make important decisions. °¨ You may resume a normal diet and activities as directed by your health care provider. °· Change bandages (dressings) as directed. °· If you have questions or problems that seem related to general anesthesia, call the hospital and ask for the anesthetist or anesthesiologist on call. °SEEK MEDICAL CARE IF: °· You have nausea and vomiting that continue the day after anesthesia. °· You develop a rash. °SEEK IMMEDIATE MEDICAL CARE IF:  °· You have difficulty breathing. °· You have chest pain. °· You have any allergic problems. °  °This information is not intended to replace advice given to you by your health care provider. Make sure you discuss any questions you have with your health care provider. °  °Document Released: 06/09/2000 Document Revised: 03/24/2014 Document Reviewed: 07/02/2011 °Elsevier Interactive Patient Education ©2016 [employer] ° °

## 2015-08-10 ENCOUNTER — Encounter (HOSPITAL_COMMUNITY): Payer: Self-pay | Admitting: Ophthalmology

## 2015-08-21 ENCOUNTER — Encounter (HOSPITAL_COMMUNITY)
Admission: RE | Admit: 2015-08-21 | Discharge: 2015-08-21 | Disposition: A | Discharge status: Home / Self Care | Payer: Medicare Other | Source: Ambulatory Visit | Attending: Ophthalmology | Admitting: Ophthalmology

## 2015-08-21 ENCOUNTER — Encounter (HOSPITAL_COMMUNITY): Payer: Self-pay

## 2015-08-27 ENCOUNTER — Ambulatory Visit (HOSPITAL_COMMUNITY): Payer: Medicare Other | Admitting: Anesthesiology

## 2015-08-27 ENCOUNTER — Ambulatory Visit (HOSPITAL_COMMUNITY)
Admission: RE | Admit: 2015-08-27 | Discharge: 2015-08-27 | Disposition: A | Discharge status: Home / Self Care | Payer: Medicare Other | Source: Ambulatory Visit | Attending: Ophthalmology | Admitting: Ophthalmology

## 2015-08-27 ENCOUNTER — Encounter (HOSPITAL_COMMUNITY)
Admission: RE | Disposition: A | Discharge status: Home / Self Care | Payer: Self-pay | Source: Ambulatory Visit | Attending: Ophthalmology

## 2015-08-27 ENCOUNTER — Encounter (HOSPITAL_COMMUNITY): Payer: Self-pay | Admitting: *Deleted

## 2015-08-27 DIAGNOSIS — E7800 Pure hypercholesterolemia, unspecified: Secondary | ICD-10-CM | POA: Diagnosis not present

## 2015-08-27 DIAGNOSIS — M797 Fibromyalgia: Secondary | ICD-10-CM | POA: Insufficient documentation

## 2015-08-27 DIAGNOSIS — I10 Essential (primary) hypertension: Secondary | ICD-10-CM | POA: Diagnosis not present

## 2015-08-27 DIAGNOSIS — K219 Gastro-esophageal reflux disease without esophagitis: Secondary | ICD-10-CM | POA: Insufficient documentation

## 2015-08-27 DIAGNOSIS — Z79899 Other long term (current) drug therapy: Secondary | ICD-10-CM | POA: Insufficient documentation

## 2015-08-27 DIAGNOSIS — H2512 Age-related nuclear cataract, left eye: Secondary | ICD-10-CM | POA: Diagnosis not present

## 2015-08-27 DIAGNOSIS — J449 Chronic obstructive pulmonary disease, unspecified: Secondary | ICD-10-CM | POA: Diagnosis not present

## 2015-08-27 DIAGNOSIS — F17200 Nicotine dependence, unspecified, uncomplicated: Secondary | ICD-10-CM | POA: Diagnosis not present

## 2015-08-27 HISTORY — PX: CATARACT EXTRACTION W/PHACO: SHX586

## 2015-08-27 SURGERY — PHACOEMULSIFICATION, CATARACT, WITH IOL INSERTION
Anesthesia: Monitor Anesthesia Care | Site: Eye | Laterality: Left

## 2015-08-27 MED ORDER — CYCLOPENTOLATE-PHENYLEPHRINE 0.2-1 % OP SOLN
1.0000 [drp] | OPHTHALMIC | Status: AC
  Administered 2015-08-27 (×3): 1 [drp] via OPHTHALMIC

## 2015-08-27 MED ORDER — BSS IO SOLN
INTRAOCULAR | Status: DC | PRN
  Administered 2015-08-27: 15 mL via INTRAOCULAR

## 2015-08-27 MED ORDER — NEOMYCIN-POLYMYXIN-DEXAMETH 3.5-10000-0.1 OP SUSP
OPHTHALMIC | Status: DC | PRN
  Administered 2015-08-27: 2 [drp] via OPHTHALMIC

## 2015-08-27 MED ORDER — EPINEPHRINE HCL 1 MG/ML IJ SOLN
INTRAMUSCULAR | Status: AC
  Filled 2015-08-27: qty 1

## 2015-08-27 MED ORDER — MIDAZOLAM HCL 2 MG/2ML IJ SOLN
1.0000 mg | INTRAMUSCULAR | Status: DC | PRN
  Administered 2015-08-27: 2 mg via INTRAVENOUS

## 2015-08-27 MED ORDER — FENTANYL CITRATE (PF) 100 MCG/2ML IJ SOLN
25.0000 ug | INTRAMUSCULAR | Status: AC | PRN
  Administered 2015-08-27 (×2): 25 ug via INTRAVENOUS

## 2015-08-27 MED ORDER — FENTANYL CITRATE (PF) 100 MCG/2ML IJ SOLN
INTRAMUSCULAR | Status: AC
  Filled 2015-08-27: qty 2

## 2015-08-27 MED ORDER — MIDAZOLAM HCL 2 MG/2ML IJ SOLN
INTRAMUSCULAR | Status: AC
  Filled 2015-08-27: qty 2

## 2015-08-27 MED ORDER — TETRACAINE HCL 0.5 % OP SOLN
1.0000 [drp] | OPHTHALMIC | Status: AC
  Administered 2015-08-27 (×3): 1 [drp] via OPHTHALMIC

## 2015-08-27 MED ORDER — LACTATED RINGERS IV SOLN
INTRAVENOUS | Status: DC
  Administered 2015-08-27: 1000 mL via INTRAVENOUS

## 2015-08-27 MED ORDER — PHENYLEPHRINE HCL 2.5 % OP SOLN
1.0000 [drp] | OPHTHALMIC | Status: AC
Start: 2015-08-27 — End: 2015-08-27
  Administered 2015-08-27 (×3): 1 [drp] via OPHTHALMIC

## 2015-08-27 MED ORDER — LIDOCAINE 3.5 % OP GEL OPTIME - NO CHARGE
OPHTHALMIC | Status: DC | PRN
  Administered 2015-08-27: 1 [drp] via OPHTHALMIC

## 2015-08-27 MED ORDER — EPINEPHRINE HCL 1 MG/ML IJ SOLN
INTRAOCULAR | Status: DC | PRN
  Administered 2015-08-27: 500 mL

## 2015-08-27 MED ORDER — POVIDONE-IODINE 5 % OP SOLN
OPHTHALMIC | Status: DC | PRN
  Administered 2015-08-27: 1 via OPHTHALMIC

## 2015-08-27 MED ORDER — LIDOCAINE HCL (PF) 1 % IJ SOLN
INTRAOCULAR | Status: DC | PRN
  Administered 2015-08-27: .5 mL via OPHTHALMIC

## 2015-08-27 MED ORDER — LIDOCAINE HCL 3.5 % OP GEL
1.0000 "application " | Freq: Once | OPHTHALMIC | Status: AC
  Administered 2015-08-27: 1 via OPHTHALMIC

## 2015-08-27 MED ORDER — PROVISC 10 MG/ML IO SOLN
INTRAOCULAR | Status: DC | PRN
  Administered 2015-08-27: 0.85 mL via INTRAOCULAR

## 2015-08-27 SURGICAL SUPPLY — 23 items
CAPSULAR TENSION RING-AMO (OPHTHALMIC RELATED) IMPLANT
CLOTH BEACON ORANGE TIMEOUT ST (SAFETY) ×3 IMPLANT
EYE SHIELD UNIVERSAL CLEAR (GAUZE/BANDAGES/DRESSINGS) ×3 IMPLANT
GLOVE BIOGEL PI IND STRL 7.0 (GLOVE) ×1 IMPLANT
GLOVE BIOGEL PI IND STRL 7.5 (GLOVE) IMPLANT
GLOVE BIOGEL PI INDICATOR 7.0 (GLOVE) ×2
GLOVE BIOGEL PI INDICATOR 7.5 (GLOVE)
GLOVE EXAM NITRILE LRG STRL (GLOVE) IMPLANT
GLOVE EXAM NITRILE MD LF STRL (GLOVE) ×3 IMPLANT
KIT VITRECTOMY (OPHTHALMIC RELATED) IMPLANT
PAD ARMBOARD 7.5X6 YLW CONV (MISCELLANEOUS) ×3 IMPLANT
PROC W NO LENS (INTRAOCULAR LENS)
PROC W SPEC LENS (INTRAOCULAR LENS)
PROCESS W NO LENS (INTRAOCULAR LENS) IMPLANT
PROCESS W SPEC LENS (INTRAOCULAR LENS) IMPLANT
RETRACTOR IRIS SIGHTPATH (OPHTHALMIC RELATED) IMPLANT
RING MALYGIN (MISCELLANEOUS) IMPLANT
SIGHTPATH CAT PROC W REG LENS (Ophthalmic Related) ×3 IMPLANT
SYRINGE LUER LOK 1CC (MISCELLANEOUS) ×3 IMPLANT
TAPE SURG TRANSPORE 1 IN (GAUZE/BANDAGES/DRESSINGS) ×1 IMPLANT
TAPE SURGICAL TRANSPORE 1 IN (GAUZE/BANDAGES/DRESSINGS) ×2
VISCOELASTIC ADDITIONAL (OPHTHALMIC RELATED) IMPLANT
WATER STERILE IRR 250ML POUR (IV SOLUTION) ×3 IMPLANT

## 2015-08-27 NOTE — H&P (Signed)
I have reviewed the H&P, the patient was re-examined, and I have identified no interval changes in medical condition and plan of care since the history and physical of record  

## 2015-08-27 NOTE — Op Note (Signed)
Date of Admission: 08/27/2015  Date of Surgery: 08/27/2015   Pre-Op Dx: Cataract Left Eye  Post-Op Dx: Senile Nuclear Cataract Left  Eye,  Dx Code H25.12  Surgeon: Tonny Branch, M.D.  Assistants: None  Anesthesia: Topical with MAC  Indications: Painless, progressive loss of vision with compromise of daily activities.  Surgery: Cataract Extraction with Intraocular lens Implant Left Eye  Discription: The patient had dilating drops and viscous lidocaine placed into the Left eye in the pre-op holding area. After transfer to the operating room, a time out was performed. The patient was then prepped and draped. Beginning with a 52 degree blade a paracentesis port was made at the surgeon's 2 o'clock position. The anterior chamber was then filled with 1% non-preserved lidocaine with epinepherine. This was followed by filling the anterior chamber with Provisc.  A 2.38mm keratome blade was used to make a clear corneal incision at the temporal limbus.  A bent cystatome needle was used to create a continuous tear capsulotomy. Hydrodissection was performed with balanced salt solution on a Fine canula. The lens nucleus was then removed using the phacoemulsification handpiece. Residual cortex was removed with the I&A handpiece. The anterior chamber and capsular bag were refilled with Provisc. A posterior chamber intraocular lens was placed into the capsular bag with it's injector. The implant was positioned with the Kuglan hook. The Provisc was then removed from the anterior chamber and capsular bag with the I&A handpiece. Stromal hydration of the main incision and paracentesis port was performed with BSS on a Fine canula. The wounds were tested for leak which was negative. The patient tolerated the procedure well. There were no operative complications. The patient was then transferred to the recovery room in stable condition.  Complications: None  Specimen: None  EBL: None  Prosthetic device: Hoya iSert 250,  power 23.0 D, SN [id number].

## 2015-08-27 NOTE — Anesthesia Preprocedure Evaluation (Signed)
Anesthesia Evaluation  Patient identified by MRN, date of birth, ID band Patient awake    Reviewed: Allergy & Precautions, H&P , NPO status , Patient's Chart, lab work & pertinent test results  History of Anesthesia Complications Negative for: history of anesthetic complications  Airway Mallampati: II     Mouth opening: Limited Mouth Opening  Dental  (+) Teeth Intact   Pulmonary shortness of breath and with exertion, COPD, Current Smoker,    Pulmonary exam normal        Cardiovascular hypertension, Pt. on medications  Rhythm:Regular Rate:Normal     Neuro/Psych    GI/Hepatic GERD  Medicated and Controlled,  Endo/Other    Renal/GU      Musculoskeletal  (+) Fibromyalgia -  Abdominal   Peds  Hematology   Anesthesia Other Findings   Reproductive/Obstetrics                             Anesthesia Physical Anesthesia Plan  ASA: II  Anesthesia Plan: MAC   Post-op Pain Management:    Induction: Intravenous  Airway Management Planned: Nasal Cannula  Additional Equipment:   Intra-op Plan:   Post-operative Plan:   Informed Consent: I have reviewed the patients History and Physical, chart, labs and discussed the procedure including the risks, benefits and alternatives for the proposed anesthesia with the patient or authorized representative who has indicated his/her understanding and acceptance.     Plan Discussed with:   Anesthesia Plan Comments:         Anesthesia Quick Evaluation

## 2015-08-27 NOTE — Anesthesia Postprocedure Evaluation (Signed)
Anesthesia Post Note  Patient: Leah Lynch  Procedure(s) Performed: Procedure(s) (LRB): CATARACT EXTRACTION PHACO AND INTRAOCULAR LENS PLACEMENT LEFT EYE; CDE:  6.12 (Left)  Patient location during evaluation: Short Stay Anesthesia Type: MAC Level of consciousness: awake and alert Pain management: pain level controlled Vital Signs Assessment: post-procedure vital signs reviewed and stable Respiratory status: spontaneous breathing Cardiovascular status: blood pressure returned to baseline Postop Assessment: no signs of nausea or vomiting Anesthetic complications: no    Last Vitals:  Filed Vitals:   08/27/15 1325 08/27/15 1330  BP: 103/55 103/55  Temp:    Resp: 17 20    Last Pain: There were no vitals filed for this visit.               Zayn Selley

## 2015-08-27 NOTE — Transfer of Care (Signed)
Immediate Anesthesia Transfer of Care Note  Patient: Leah Lynch  Procedure(s) Performed: Procedure(s): CATARACT EXTRACTION PHACO AND INTRAOCULAR LENS PLACEMENT LEFT EYE; CDE:  6.12 (Left)  Patient Location: PACU  Anesthesia Type:MAC  Level of Consciousness: awake  Airway & Oxygen Therapy: Patient Spontanous Breathing  Post-op Assessment: Report given to RN  Post vital signs: Reviewed  Last Vitals:  Filed Vitals:   08/27/15 1325 08/27/15 1330  BP: 103/55 103/55  Temp:    Resp: 17 20    Last Pain: There were no vitals filed for this visit.    Patients Stated Pain Goal: 7 ([id number])  Complications: No apparent anesthesia complications

## 2015-08-27 NOTE — Discharge Instructions (Signed)

## 2015-08-28 ENCOUNTER — Encounter (HOSPITAL_COMMUNITY): Payer: Self-pay | Admitting: Ophthalmology

## 2016-01-07 ENCOUNTER — Other Ambulatory Visit (HOSPITAL_COMMUNITY): Payer: Self-pay | Admitting: Family Medicine

## 2016-01-07 DIAGNOSIS — Z1231 Encounter for screening mammogram for malignant neoplasm of breast: Secondary | ICD-10-CM

## 2016-02-18 ENCOUNTER — Ambulatory Visit (HOSPITAL_COMMUNITY)
Admission: RE | Admit: 2016-02-18 | Discharge: 2016-02-18 | Disposition: A | Discharge status: Home / Self Care | Payer: Medicare Other | Source: Ambulatory Visit | Attending: Family Medicine | Admitting: Family Medicine

## 2016-02-18 DIAGNOSIS — Z1231 Encounter for screening mammogram for malignant neoplasm of breast: Secondary | ICD-10-CM | POA: Diagnosis not present

## 2016-05-19 ENCOUNTER — Other Ambulatory Visit (HOSPITAL_COMMUNITY): Payer: Self-pay | Admitting: Family Medicine

## 2016-05-19 DIAGNOSIS — Z780 Asymptomatic menopausal state: Secondary | ICD-10-CM

## 2016-05-19 DIAGNOSIS — M25562 Pain in left knee: Secondary | ICD-10-CM

## 2016-05-19 DIAGNOSIS — M25561 Pain in right knee: Secondary | ICD-10-CM

## 2016-05-26 ENCOUNTER — Ambulatory Visit (HOSPITAL_COMMUNITY)
Admission: RE | Admit: 2016-05-26 | Discharge: 2016-05-26 | Disposition: A | Discharge status: Home / Self Care | Payer: Medicare Other | Source: Ambulatory Visit | Attending: Family Medicine | Admitting: Family Medicine

## 2016-05-26 DIAGNOSIS — M25561 Pain in right knee: Secondary | ICD-10-CM

## 2016-05-26 DIAGNOSIS — Z780 Asymptomatic menopausal state: Secondary | ICD-10-CM

## 2016-05-26 DIAGNOSIS — M25562 Pain in left knee: Secondary | ICD-10-CM

## 2016-05-26 DIAGNOSIS — Z13820 Encounter for screening for osteoporosis: Secondary | ICD-10-CM | POA: Insufficient documentation

## 2016-05-26 DIAGNOSIS — I739 Peripheral vascular disease, unspecified: Secondary | ICD-10-CM | POA: Diagnosis not present

## 2016-05-26 DIAGNOSIS — M810 Age-related osteoporosis without current pathological fracture: Secondary | ICD-10-CM | POA: Diagnosis not present

## 2016-05-26 DIAGNOSIS — M1711 Unilateral primary osteoarthritis, right knee: Secondary | ICD-10-CM | POA: Diagnosis not present

## 2016-08-21 ENCOUNTER — Emergency Department (HOSPITAL_COMMUNITY): Payer: Medicare Other

## 2016-08-21 ENCOUNTER — Encounter (HOSPITAL_COMMUNITY): Payer: Self-pay | Admitting: Emergency Medicine

## 2016-08-21 ENCOUNTER — Emergency Department (HOSPITAL_COMMUNITY)
Admission: EM | Admit: 2016-08-21 | Discharge: 2016-08-21 | Disposition: A | Discharge status: Home / Self Care | Payer: Medicare Other | Attending: Emergency Medicine | Admitting: Emergency Medicine

## 2016-08-21 DIAGNOSIS — Z79899 Other long term (current) drug therapy: Secondary | ICD-10-CM | POA: Diagnosis not present

## 2016-08-21 DIAGNOSIS — M545 Low back pain, unspecified: Secondary | ICD-10-CM

## 2016-08-21 DIAGNOSIS — F17210 Nicotine dependence, cigarettes, uncomplicated: Secondary | ICD-10-CM | POA: Diagnosis not present

## 2016-08-21 DIAGNOSIS — I10 Essential (primary) hypertension: Secondary | ICD-10-CM | POA: Diagnosis not present

## 2016-08-21 HISTORY — DX: Age-related osteoporosis without current pathological fracture: M81.0

## 2016-08-21 LAB — URINALYSIS, ROUTINE W REFLEX MICROSCOPIC
Bacteria, UA: NONE SEEN
Bilirubin Urine: NEGATIVE
Glucose, UA: NEGATIVE mg/dL
Hgb urine dipstick: NEGATIVE
Ketones, ur: NEGATIVE mg/dL
Nitrite: NEGATIVE
Protein, ur: NEGATIVE mg/dL
Specific Gravity, Urine: 1.023 (ref 1.005–1.030)
pH: 6 (ref 5.0–8.0)

## 2016-08-21 MED ORDER — NAPROXEN 500 MG PO TABS: 500.0000 mg | ORAL_TABLET | Freq: Two times a day (BID) | ORAL | 0 refills | Status: DC

## 2016-08-21 MED ORDER — OXYCODONE-ACETAMINOPHEN 5-325 MG PO TABS
1.0000 | ORAL_TABLET | Freq: Once | ORAL | Status: AC
  Administered 2016-08-21: 1 via ORAL
  Filled 2016-08-21: qty 1

## 2016-08-21 MED ORDER — METHOCARBAMOL 500 MG PO TABS: 500.0000 mg | ORAL_TABLET | Freq: Three times a day (TID) | ORAL | 0 refills | Status: DC

## 2016-08-21 NOTE — ED Triage Notes (Signed)
Patient complaining of lower back pain x 4 days. Denies injury. Denies dysuria.

## 2016-08-21 NOTE — ED Provider Notes (Signed)
70 y/o female with known history of arthritis who prsents with RLB pain X 5 days - started after going to her PCP on Monday - has no radiation and is ttp on exam over the R SI joint - no midline ttp, no neuro sx and no other red flags.  Stable for d/c on nsaids / ice Pt given results and plan and agreeable.  Medical screening examination/treatment/procedure(s) were conducted as a shared visit with non-physician practitioner(s) and myself.  I personally evaluated the patient during the encounter.  Clinical Impression:   Final diagnoses:  Acute right-sided low back pain without sciatica         Noemi Chapel, MD 08/23/16 [phone]

## 2016-08-21 NOTE — Discharge Instructions (Signed)
Alternate ice and heat to your back.  Follow-up with your primary doctor for recheck °

## 2016-08-21 NOTE — ED Provider Notes (Signed)
[facility] DEPT Provider Note   CSN: [id number] Arrival date & time: 08/21/16  1123     History   Chief Complaint Chief Complaint  Patient presents with  . Back Pain    HPI Leah Lynch is a 71 y.o. female.  HPI  Leah Lynch is a 71 y.o. female who presents to the Emergency Department complaining of right low back pain for nearly one week.  She reports hx of arthritis and takes hydrocodone for pain as needed.  She has not taken it today.  Pain was gradual in onset.  Worse with sitting, standing and lying supine.  Improves with lying on her side.  She denies known injury, numbness, pain or weakness of the lower extremities, abd pain, urine or bowel changes.    Past Medical History:  Diagnosis Date  . Fibrocystic breast   . GERD (gastroesophageal reflux disease)   . Hyperlipidemia   . Hypertension   . Osteoarthritis   . Osteoporosis   . Shortness of breath dyspnea     There are no active problems to display for this patient.   Past Surgical History:  Procedure Laterality Date  . ABDOMINAL HYSTERECTOMY    . BREAST SURGERY     removal of cyst from right breast-fibrocystsic  . BUNIONECTOMY  12/19/2010   Procedure: Lillard Anes;  Surgeon: Marcheta Grammes;  Location: [facility];  Service: Orthopedics;  Laterality: Right;  [employer], [name] Bunionectomy  . BUNIONECTOMY  05/22/2011   Procedure: Lillard Anes;  Surgeon: Marcheta Grammes, DPM;  Location: [facility];  Service: Orthopedics;  Laterality: Left;  [name] Bunionectomy Left Foot  . CAPSULOTOMY  01/29/2012   Procedure: CAPSULOTOMY;  Surgeon: Marcheta Grammes, DPM;  Location: [facility];  Service: Orthopedics;  Laterality: Left;  . CATARACT EXTRACTION W/PHACO Right 08/09/2015   Procedure: CATARACT EXTRACTION PHACO AND INTRAOCULAR LENS PLACEMENT RIGHT EYE CDE=7.95;  Surgeon: Tonny Branch, MD;  Location: [facility];  Service: Ophthalmology;  Laterality: Right;  . CATARACT EXTRACTION W/PHACO Left  08/27/2015   Procedure: CATARACT EXTRACTION PHACO AND INTRAOCULAR LENS PLACEMENT LEFT EYE; CDE:  6.12;  Surgeon: Tonny Branch, MD;  Location: [facility];  Service: Ophthalmology;  Laterality: Left;  . DIAGNOSTIC LAPAROSCOPY    . FOOT GANGLION EXCISION     right foot  . METATARSAL OSTEOTOMY  05/22/2011   Procedure: METATARSAL OSTEOTOMY;  Surgeon: Marcheta Grammes, DPM;  Location: [facility];  Service: Orthopedics;  Laterality: Left;  [name] Osteotomy Left Foot  . TONSILLECTOMY  age 52  . TUBAL LIGATION      OB History    No data available       Home Medications    Prior to Admission medications   Medication Sig Start Date End Date Taking? Authorizing Provider  atenolol-chlorthalidone (TENORETIC) 50-25 MG per tablet Take 1 tablet by mouth daily.     [provider]  B Complex-C (SUPER B COMPLEX PO) Take 1 tablet by mouth daily.     [provider]  celecoxib (CELEBREX) 200 MG capsule Take 1 capsule by mouth daily. 05/30/15   [provider]  Cholecalciferol (VITAMIN D-3 PO) Take 1 tablet by mouth daily.    [provider]  HYDROcodone-acetaminophen (NORCO) 10-325 MG per tablet Take 1 tablet by mouth every 8 (eight) hours as needed (headaches).     [provider]  Omega-3 Fatty Acids (FISH OIL) 1000 MG CAPS Take 1 capsule by mouth daily.    [provider]  omeprazole (PRILOSEC) 20  MG capsule Take 20 mg by mouth daily.    [provider]  potassium chloride (KLOR-CON) 10 MEQ CR tablet Take 20 mEq by mouth daily.     [provider]  pravastatin (PRAVACHOL) 40 MG tablet Take 40 mg by mouth daily.      [provider]  PROAIR HFA 108 [phone] Base) MCG/ACT inhaler Inhale 2 puffs into the lungs every 6 (six) hours as needed for wheezing or shortness of breath.  05/22/15   [provider]  traZODone (DESYREL) 50 MG tablet Take 2 tablets by mouth at bedtime. 06/13/15   [provider]    Family  History Family History  Problem Relation Age of Onset  . Anesthesia problems Neg Hx   . Hypotension Neg Hx   . Malignant hyperthermia Neg Hx   . Pseudochol deficiency Neg Hx     Social History Social History  Substance Use Topics  . Smoking status: Current Every Day Smoker    Packs/day: 0.50    Years: 25.00    Types: Cigarettes  . Smokeless tobacco: Never Used  . Alcohol use No     Allergies   Bee venom; Penicillins; and Sulfa antibiotics   Review of Systems Review of Systems  Constitutional: Negative for fever.  Respiratory: Negative for shortness of breath.   Gastrointestinal: Negative for abdominal pain, constipation and vomiting.  Genitourinary: Negative for decreased urine volume, difficulty urinating, dysuria, flank pain and hematuria.  Musculoskeletal: Positive for back pain. Negative for joint swelling.  Skin: Negative for rash.  Neurological: Negative for weakness and numbness.  All other systems reviewed and are negative.    Physical Exam Updated Vital Signs BP (!) 141/77 (BP Location: Right Arm)   Pulse 81   Temp 98.1 F (36.7 C) (Oral)   Resp 20   Ht 4\' 11"  (1.499 m)   Wt 69.9 kg (154 lb)   SpO2 95%   BMI 31.10 kg/m   Physical Exam  Constitutional: She is oriented to person, place, and time. She appears well-developed and well-nourished. No distress.  HENT:  Head: Normocephalic and atraumatic.  Neck: Normal range of motion. Neck supple.  Cardiovascular: Normal rate, regular rhythm, normal heart sounds and intact distal pulses.   No murmur heard. Pulmonary/Chest: Effort normal and breath sounds normal. No respiratory distress.  Abdominal: Soft. She exhibits no distension. There is no tenderness.  Musculoskeletal: She exhibits tenderness. She exhibits no edema.       Lumbar back: She exhibits tenderness and pain. She exhibits normal range of motion, no swelling, no deformity, no laceration and normal pulse.  ttp of the right lumbar paraspinal  muscles and SI joint.    Pt has 5/5 strength against resistance of bilateral lower extremities.     Neurological: She is alert and oriented to person, place, and time. She has normal strength. No sensory deficit. She exhibits normal muscle tone. Coordination and gait normal.  Reflex Scores:      Patellar reflexes are 2+ on the right side and 2+ on the left side.      Achilles reflexes are 2+ on the right side and 2+ on the left side. Skin: Skin is warm and dry. No rash noted.  Nursing note and vitals reviewed.    ED Treatments / Results  Labs (all labs ordered are listed, but only abnormal results are displayed) Labs Reviewed  URINALYSIS, ROUTINE W REFLEX MICROSCOPIC - Abnormal; Notable for the following:       Result Value  Color, Urine AMBER (*)    APPearance CLOUDY (*)    Leukocytes, UA SMALL (*)    Squamous Epithelial / LPF 0-5 (*)    All other components within normal limits  URINE CULTURE    EKG  EKG Interpretation None       Radiology Dg Lumbar Spine Complete  Result Date: 08/21/2016 CLINICAL DATA:  Lumbago EXAM: LUMBAR SPINE - COMPLETE 4+ VIEW COMPARISON:  None. FINDINGS: Frontal, lateral, spot lumbosacral lateral, and bilateral oblique views were obtained. There are 5 non-rib-bearing lumbar type vertebral bodies. There is mid lumbar dextroscoliosis. There is no fracture. There is 4 mm of anterolisthesis of L4 on L5. No other spondylolisthesis is evident. There is marked disc space narrowing at L1-2 and L2-3. There is moderate disc space narrowing at L3-4 and L4-5. There is facet osteoarthritic change at all levels bilaterally. There is aortoiliac atherosclerosis. IMPRESSION: No fracture. Mild spondylolisthesis at L4-5, a finding likely due to underlying spondylosis. There is multilevel arthropathy, most marked at L1-2 and L2-3. There is scoliosis. There is aortoiliac atherosclerosis. Electronically Signed   By: Lowella Grip III M.D.   On: 08/21/2016 12:50     Procedures Procedures (including critical care time)  Medications Ordered in ED Medications  oxyCODONE-acetaminophen (PERCOCET/ROXICET) 5-325 MG per tablet 1 tablet (1 tablet Oral Given 08/21/16 1252)     Initial Impression / Assessment and Plan / ED Course  I have reviewed the triage vital signs and the nursing notes.  Pertinent labs & imaging results that were available during my care of the patient were reviewed by me and considered in my medical decision making (see chart for details).     Pt also seen by Dr. Sabra Heck and care plan discussed.   Pt is well appearing.  No focal neuro deficits.  Ambulates with steady gait.  No concerning sx's for emergent neurological or infectious process.   Final Clinical Impressions(s) / ED Diagnoses   Final diagnoses:  Acute right-sided low back pain without sciatica    New Prescriptions New Prescriptions   No medications on file     Bufford Lope 08/21/16 2029    Noemi Chapel, MD 08/22/16 [phone]

## 2016-08-22 LAB — URINE CULTURE

## 2016-09-18 ENCOUNTER — Encounter: Payer: Self-pay | Admitting: Internal Medicine

## 2016-11-12 ENCOUNTER — Ambulatory Visit: Payer: Medicare Other | Admitting: Nurse Practitioner

## 2016-11-12 ENCOUNTER — Encounter: Payer: Self-pay | Admitting: Nurse Practitioner

## 2016-11-12 ENCOUNTER — Ambulatory Visit (INDEPENDENT_AMBULATORY_CARE_PROVIDER_SITE_OTHER): Payer: Medicare Other | Admitting: Nurse Practitioner

## 2016-11-12 DIAGNOSIS — R197 Diarrhea, unspecified: Secondary | ICD-10-CM

## 2016-11-12 DIAGNOSIS — R110 Nausea: Secondary | ICD-10-CM

## 2016-11-12 DIAGNOSIS — R1030 Lower abdominal pain, unspecified: Secondary | ICD-10-CM

## 2016-11-12 DIAGNOSIS — R109 Unspecified abdominal pain: Secondary | ICD-10-CM | POA: Insufficient documentation

## 2016-11-12 NOTE — Assessment & Plan Note (Signed)
Vision describes nausea and addition to her abdominal pain and diarrhea symptoms. Nausea is not severe, no vomiting. At this point continue to monitor and call if any worsening symptoms at which point we can send in Zofran. Return for follow-up in 6 weeks.

## 2016-11-12 NOTE — Assessment & Plan Note (Signed)
In addition to her diarrhea the patient describes abdominal pain in the lower abdomen which is described as a dull ache and pain sometimes improves with a bowel movement. This is consistent with irritable bowel syndrome diarrhea type. We will check stool studies as per above for completeness. We can consider Bentyl 10 mg 3 times a day as needed if her stool studies are normal and do not show an infection. We can trial other options of Bentyl does not help. Return for follow-up in 6 weeks. If no improvement or any worsening despite medications and normal stool studies we can consider early interval colonoscopy at that time.

## 2016-11-12 NOTE — Patient Instructions (Signed)
1. When you collect her stool samples bring them to the lab. 2. We'll get the results of your stool studies we will call you. If there is no infection we can send in a prescription medicine to help with her diarrhea and abdominal pain. 3. I will have you return in 6 weeks to follow-up. Depending on how you progressed we may consider repeating her colonoscopy at that time. 4. Call us if you have any questions or concerns.

## 2016-11-12 NOTE — Progress Notes (Signed)
Primary Care Physician:  Lemmie Evens, MD Primary Gastroenterologist:  Dr. Gala Romney  Chief Complaint  Patient presents with  . Diarrhea  . Abdominal Pain  . Nausea    HPI:   Leah Lynch is a 71 y.o. female who presents On referral from primary care for persistent diarrhea. The patient was last seen by primary care on 09/16/2016. Patient was referred to [place] after that visit. She is only been seen by [place] 1 time in 2012 for colonoscopy. Colonoscopy was completed 07/17/2010 first-ever colonoscopy at age 72 which found internal hemorrhoids, diminutive rectal polyp status post biopsy, descending colon polyp status post biopsy. Remainder of colonic mucosa normal. Surgical pathology found the polyps to be hyperplastic. Recommended repeat colonoscopy in 10 years (2022).  Today she states she's had diarrhea since last year. States if she doesn't take antidiarrheal pills everything she eats will go right through her; taking OTC Imodium. She is currently taking them as needed. Also with lower abdominal pain described as a dull ache and is associated with nausea but no vomiting. Pain sometimes improves with a bowel movement. Denies hematochezia, melena, fever, chills. Initially lost some weight, but she has put this back on. Denies chest pain, dyspnea, dizziness, lightheadedness, syncope, near syncope. Denies any other upper or lower GI symptoms.  Past Medical History:  Diagnosis Date  . Fibrocystic breast   . GERD (gastroesophageal reflux disease)   . Hyperlipidemia   . Hypertension   . Osteoarthritis   . Osteoporosis   . Shortness of breath dyspnea     Past Surgical History:  Procedure Laterality Date  . ABDOMINAL HYSTERECTOMY    . BREAST SURGERY     removal of cyst from right breast-fibrocystsic  . BUNIONECTOMY  12/19/2010   Procedure: Lillard Anes;  Surgeon: Marcheta Grammes;  Location: [facility];  Service: Orthopedics;  Laterality: Right;  [employer], [name] Bunionectomy   . BUNIONECTOMY  05/22/2011   Procedure: Lillard Anes;  Surgeon: Marcheta Grammes, DPM;  Location: [facility];  Service: Orthopedics;  Laterality: Left;  [name] Bunionectomy Left Foot  . CAPSULOTOMY  01/29/2012   Procedure: CAPSULOTOMY;  Surgeon: Marcheta Grammes, DPM;  Location: [facility];  Service: Orthopedics;  Laterality: Left;  . CATARACT EXTRACTION W/PHACO Right 08/09/2015   Procedure: CATARACT EXTRACTION PHACO AND INTRAOCULAR LENS PLACEMENT RIGHT EYE CDE=7.95;  Surgeon: Tonny Branch, MD;  Location: [facility];  Service: Ophthalmology;  Laterality: Right;  . CATARACT EXTRACTION W/PHACO Left 08/27/2015   Procedure: CATARACT EXTRACTION PHACO AND INTRAOCULAR LENS PLACEMENT LEFT EYE; CDE:  6.12;  Surgeon: Tonny Branch, MD;  Location: [facility];  Service: Ophthalmology;  Laterality: Left;  . DIAGNOSTIC LAPAROSCOPY    . FOOT GANGLION EXCISION     right foot  . METATARSAL OSTEOTOMY  05/22/2011   Procedure: METATARSAL OSTEOTOMY;  Surgeon: Marcheta Grammes, DPM;  Location: [facility];  Service: Orthopedics;  Laterality: Left;  [name] Osteotomy Left Foot  . TONSILLECTOMY  age 59  . TUBAL LIGATION      Current Outpatient Prescriptions  Medication Sig Dispense Refill  . atenolol-chlorthalidone (TENORETIC) 50-25 MG per tablet Take 0.5 tablets by mouth daily.     . B Complex-C (SUPER B COMPLEX PO) Take 1 tablet by mouth daily.     . celecoxib (CELEBREX) 200 MG capsule Take 1 capsule by mouth daily.    . Cholecalciferol (VITAMIN D-3 PO) Take 1 tablet by mouth daily.    . Chromium 1000 MCG TABS Take by mouth daily.    [name]  HYDROcodone-acetaminophen (NORCO) 10-325 MG per tablet Take 1 tablet by mouth every 8 (eight) hours as needed (headaches).     . loperamide (IMODIUM) 2 MG capsule Take 2 mg by mouth as needed for diarrhea or loose stools.    [name] LORazepam (ATIVAN) 1 MG tablet Take 0.5-1 tablets by mouth at bedtime.    [name] Lysine HCl 500 MG TABS Take 1-2 tablets by mouth at bedtime as needed.    . naproxen  (NAPROSYN) 500 MG tablet Take 1 tablet (500 mg total) by mouth 2 (two) times daily with a meal. Take with food (Patient taking differently: Take 500 mg by mouth as needed. Take with food) 14 tablet 0  . Omega-3 Fatty Acids (FISH OIL) 1000 MG CAPS Take 1 capsule by mouth daily.    [name] omeprazole (PRILOSEC) 20 MG capsule Take 20 mg by mouth daily.    . potassium chloride (KLOR-CON) 10 MEQ CR tablet Take 20 mEq by mouth daily.     . pravastatin (PRAVACHOL) 40 MG tablet Take 40 mg by mouth daily.      [name] PROAIR HFA 108 (90 Base) MCG/ACT inhaler Inhale 2 puffs into the lungs every 6 (six) hours as needed for wheezing or shortness of breath.     . traZODone (DESYREL) 50 MG tablet Take 1-2 tablets by mouth at bedtime.      No current facility-administered medications for this visit.     Allergies as of 11/12/2016 - Review Complete 11/12/2016  Allergen Reaction Noted  . Bee venom Swelling 11/11/2010  . Penicillins Hives and Itching 06/27/2010  . Sulfa antibiotics Itching 11/11/2010    Family History  Problem Relation Age of Onset  . Anesthesia problems Neg Hx   . Hypotension Neg Hx   . Malignant hyperthermia Neg Hx   . Pseudochol deficiency Neg Hx   . Colon cancer Neg Hx     Social History   Social History  . Marital status: Divorced    Spouse name: N/A  . Number of children: N/A  . Years of education: N/A   Occupational History  . Not on file.   Social History Main Topics  . Smoking status: Current Every Day Smoker    Packs/day: 0.50    Years: 25.00    Types: Cigarettes  . Smokeless tobacco: Never Used  . Alcohol use No  . Drug use: No  . Sexual activity: Yes    Birth control/ protection: Post-menopausal   Other Topics Concern  . Not on file   Social History Narrative  . No narrative on file    Review of Systems: General: Negative for anorexia, weight loss, fever, chills, fatigue, weakness. ENT: Negative for hoarseness, difficulty swallowing. CV: Negative for chest  pain, angina, palpitations, peripheral edema.  Respiratory: Negative for dyspnea at rest, cough, sputum, wheezing.  GI: See history of present illness. MS: Negative for joint pain, low back pain.  Derm: Negative for rash or itching.  Endo: Negative for unusual weight change.  Heme: Negative for bruising or bleeding. Allergy: Negative for rash or hives.    Physical Exam: BP 131/71   Pulse 73   Temp 97.6 F (36.4 C) (Oral)   Ht 4\' 11"  (1.499 m)   Wt 146 lb (66.2 kg)   BMI 29.49 kg/m  General:   Alert and oriented. Pleasant and cooperative. Well-nourished and well-developed.  Eyes:  Without icterus, sclera clear and conjunctiva pink.  Ears:  Normal auditory acuity. Cardiovascular:  S1, S2 present without murmurs appreciated.  Extremities without clubbing or edema. Respiratory:  Clear to auscultation bilaterally. No wheezes, rales, or rhonchi. No distress.  Gastrointestinal:  +BS, soft, non-tender and non-distended. No HSM noted. No guarding or rebound. No masses appreciated.  Rectal:  Deferred  Musculoskalatal:  Symmetrical without gross deformities. Neurologic:  Alert and oriented x4;  grossly normal neurologically. Psych:  Alert and cooperative. Normal mood and affect. Heme/Lymph/Immune: No excessive bruising noted.    11/12/2016 2:43 PM   Disclaimer: This note was dictated with voice recognition software. Similar sounding words can inadvertently be transcribed and may not be corrected upon review.

## 2016-11-12 NOTE — Assessment & Plan Note (Signed)
The patient states ongoing diarrhea since sometime last year, although she is not sure when. Seems to be worsening. If she does not take antidiarrheal pills everything will go through her when she eats. She is currently using over-the-counter Imodium as needed. At this point I'll check stool studies for completeness. We can consider Bentyl 10 mg 3 times a day as needed. Return for follow-up in 6 weeks. If she has persistent symptoms despite normal stool studies and Bentyl we can consider early interval colonoscopy as a further evaluation measure.

## 2016-11-13 NOTE — Progress Notes (Signed)
cc'ed to pcp °

## 2016-11-20 LAB — GASTROINTESTINAL PATHOGEN PANEL PCR
C. difficile Tox A/B, PCR: NOT DETECTED
CRYPTOSPORIDIUM, PCR: NOT DETECTED
Campylobacter, PCR: NOT DETECTED
E coli (ETEC) LT/ST PCR: NOT DETECTED
E coli (STEC) stx1/stx2, PCR: NOT DETECTED
E coli 0157, PCR: NOT DETECTED
GIARDIA LAMBLIA, PCR: NOT DETECTED
NOROVIRUS, PCR: NOT DETECTED
Rotavirus A, PCR: NOT DETECTED
SHIGELLA, PCR: NOT DETECTED
Salmonella, PCR: NOT DETECTED

## 2016-11-20 LAB — CLOSTRIDIUM DIFFICILE BY PCR: Toxigenic C. Difficile by PCR: NOT DETECTED

## 2016-12-01 ENCOUNTER — Other Ambulatory Visit: Payer: Self-pay | Admitting: Nurse Practitioner

## 2016-12-01 MED ORDER — DICYCLOMINE HCL 10 MG PO CAPS: 10.0000 mg | ORAL_CAPSULE | Freq: Four times a day (QID) | ORAL | 0 refills | Status: DC

## 2017-01-01 ENCOUNTER — Encounter: Payer: Self-pay | Admitting: Nurse Practitioner

## 2017-01-01 ENCOUNTER — Ambulatory Visit (INDEPENDENT_AMBULATORY_CARE_PROVIDER_SITE_OTHER): Payer: Medicare Other | Admitting: Nurse Practitioner

## 2017-01-01 VITALS — BP 129/77 | HR 86 | Temp 98.9°F | Ht 59.0 in | Wt 148.0 lb

## 2017-01-01 DIAGNOSIS — R110 Nausea: Secondary | ICD-10-CM | POA: Diagnosis not present

## 2017-01-01 DIAGNOSIS — R197 Diarrhea, unspecified: Secondary | ICD-10-CM

## 2017-01-01 DIAGNOSIS — R1030 Lower abdominal pain, unspecified: Secondary | ICD-10-CM | POA: Diagnosis not present

## 2017-01-01 MED ORDER — ELUXADOLINE 75 MG PO TABS: 75.0000 mg | ORAL_TABLET | Freq: Two times a day (BID) | ORAL | 0 refills | Status: DC

## 2017-01-01 NOTE — Assessment & Plan Note (Signed)
Abdominal pain in conjunction with diarrhea. She likely has irritable bowel syndrome diarrhea type. She is wanting to hold off on colonoscopy for now, as per above. She is next due for routine screening in 2022. We'll further manage her as irritable bowel syndrome diarrhea type to see if this helps, as per above. Return for follow-up in 2 months.

## 2017-01-01 NOTE — Patient Instructions (Signed)
1. I am giving the samples of Viberzi 75 mg. Take this twice a day, with food. 2. Call us in 1-2 weeks and let us know if the medication is helping. 3. If you have any significant abdominal pain or other side effects, stop the medicine and call our office. 4. Return for follow-up in 2 months. 5. Call us if you have any questions or concerns.

## 2017-01-01 NOTE — Assessment & Plan Note (Signed)
Nausea seems to have improved for now. Continue to monitor, notify us of any worsening symptoms.

## 2017-01-01 NOTE — Progress Notes (Signed)
cc'ed to pcp °

## 2017-01-01 NOTE — Assessment & Plan Note (Signed)
Persistent diarrhea. Bentyl did not help her loose stools. She is taking Imodium every day to every other day. She feels that her medications are responsible for her diarrhea. She is due for colonoscopy in 2022. I discussed the possibility of early interval colonoscopy due to chronic diarrhea over the past 1-2 years. She is wanting to hold off on this for now. Her symptoms could be related to irritable bowel syndrome given her current intermittent abdominal pain. She does have a gallbladder, which was confirmed with her twice. I will trial her on Viberzi 75 mg twice a day with samples to last 2 weeks and request a progress report in 1-2 weeks. Return for follow-up in 2 months. She has any adverse effects, specifically worsening abdominal pain, she is to stop the medicine and call us back. She verbalized understanding of this. Return for follow-up in 2 months.

## 2017-01-01 NOTE — Progress Notes (Signed)
Referring Provider: Lemmie Evens, MD Primary Care Physician:  Lemmie Evens, MD Primary GI:  Dr. Gala Romney  Chief Complaint  Patient presents with  . Follow-up    diarrhea.abd pain    HPI:   Leah Lynch is a 71 y.o. female who presents for follow-up on abdominal pain and diarrhea. The patient was last seen in our office 11/12/2016 for the same. Colonoscopy up-to-date, next due in 2022. At her last visit she noted diarrhea since previous year and if she doesn't take over-the-counter Imodium everything she eats or go right through her. Lower abdominal pain which sometimes improves with bowel movement. No other GI symptoms. Recommended stool studies, consider Bentyl as needed if no infection noted. Consider early interval colonoscopy if no improvement.  Stool studies labs reviewed completed 11/19/2016. C. difficile negative. GI pathogen panel negative. Prescription for Bentyl was sent to her pharmacy 10 mg 4 times a day as needed for diarrhea and/or abdominal pain.  Today she states she's doing ok overall. Still with diarrhea and abdominal pain, but thinks it's related to her medications. Bentyl helped with cramps, but not diarrhea. Still taking Imodium OTC every day to every other day. Denies hematochezia, melena, fever, chills, unintentional weight loss. Nausea improved, still triggered by a specific medication. Has had a decreased appetite. Denies chest pain, dyspnea, dizziness, lightheadedness, syncope, near syncope. Denies any other upper or lower GI symptoms.   Past Medical History:  Diagnosis Date  . Fibrocystic breast   . GERD (gastroesophageal reflux disease)   . Hyperlipidemia   . Hypertension   . Osteoarthritis   . Osteoporosis   . Shortness of breath dyspnea     Past Surgical History:  Procedure Laterality Date  . ABDOMINAL HYSTERECTOMY    . BREAST SURGERY     removal of cyst from right breast-fibrocystsic  . BUNIONECTOMY  12/19/2010   Procedure: Lillard Anes;   Surgeon: Marcheta Grammes;  Location: [facility];  Service: Orthopedics;  Laterality: Right;  [employer], [name] Bunionectomy  . BUNIONECTOMY  05/22/2011   Procedure: Lillard Anes;  Surgeon: Marcheta Grammes, DPM;  Location: [facility];  Service: Orthopedics;  Laterality: Left;  [name] Bunionectomy Left Foot  . CAPSULOTOMY  01/29/2012   Procedure: CAPSULOTOMY;  Surgeon: Marcheta Grammes, DPM;  Location: [facility];  Service: Orthopedics;  Laterality: Left;  . CATARACT EXTRACTION W/PHACO Right 08/09/2015   Procedure: CATARACT EXTRACTION PHACO AND INTRAOCULAR LENS PLACEMENT RIGHT EYE CDE=7.95;  Surgeon: Tonny Branch, MD;  Location: [facility];  Service: Ophthalmology;  Laterality: Right;  . CATARACT EXTRACTION W/PHACO Left 08/27/2015   Procedure: CATARACT EXTRACTION PHACO AND INTRAOCULAR LENS PLACEMENT LEFT EYE; CDE:  6.12;  Surgeon: Tonny Branch, MD;  Location: [facility];  Service: Ophthalmology;  Laterality: Left;  . DIAGNOSTIC LAPAROSCOPY    . FOOT GANGLION EXCISION     right foot  . METATARSAL OSTEOTOMY  05/22/2011   Procedure: METATARSAL OSTEOTOMY;  Surgeon: Marcheta Grammes, DPM;  Location: [facility];  Service: Orthopedics;  Laterality: Left;  [name] Osteotomy Left Foot  . TONSILLECTOMY  age 78  . TUBAL LIGATION      Current Outpatient Prescriptions  Medication Sig Dispense Refill  . alendronate (FOSAMAX) 70 MG tablet every 7 (seven) days.    [name] atenolol-chlorthalidone (TENORETIC) 50-25 MG per tablet Take 0.5 tablets by mouth daily.     . B Complex-C (SUPER B COMPLEX PO) Take 1 tablet by mouth daily.     . celecoxib (CELEBREX) 200 MG capsule Take 1  capsule by mouth daily.    . Cholecalciferol (VITAMIN D-3 PO) Take 1 tablet by mouth daily.    . Chromium 1000 MCG TABS Take by mouth daily.    [name] HYDROcodone-acetaminophen (NORCO) 10-325 MG per tablet Take 1 tablet by mouth 3 (three) times daily.     [name] loperamide (IMODIUM) 2 MG capsule Take 2 mg by mouth as needed for diarrhea or loose  stools.    [name] LORazepam (ATIVAN) 1 MG tablet Take 0.5-1 tablets by mouth at bedtime.    [name] Lysine HCl 500 MG TABS Take 1-2 tablets by mouth at bedtime as needed.    . naproxen (NAPROSYN) 500 MG tablet Take 1 tablet (500 mg total) by mouth 2 (two) times daily with a meal. Take with food (Patient taking differently: Take 500 mg by mouth as needed. Take with food) 14 tablet 0  . Omega-3 Fatty Acids (FISH OIL) 1000 MG CAPS Take 1 capsule by mouth daily.    [name] omeprazole (PRILOSEC) 20 MG capsule Take 20 mg by mouth daily.    . potassium chloride (KLOR-CON) 10 MEQ CR tablet Take 20 mEq by mouth daily.     . pravastatin (PRAVACHOL) 40 MG tablet Take 40 mg by mouth daily.      [name] PROAIR HFA 108 (90 Base) MCG/ACT inhaler Inhale 2 puffs into the lungs every 6 (six) hours as needed for wheezing or shortness of breath.     . traZODone (DESYREL) 50 MG tablet Take 1-2 tablets by mouth at bedtime.      No current facility-administered medications for this visit.     Allergies as of 01/01/2017 - Review Complete 01/01/2017  Allergen Reaction Noted  . Bee venom Swelling 11/11/2010  . Penicillins Hives and Itching 06/27/2010  . Sulfa antibiotics Itching 11/11/2010    Family History  Problem Relation Age of Onset  . Anesthesia problems Neg Hx   . Hypotension Neg Hx   . Malignant hyperthermia Neg Hx   . Pseudochol deficiency Neg Hx   . Colon cancer Neg Hx     Social History   Social History  . Marital status: Divorced    Spouse name: N/A  . Number of children: N/A  . Years of education: N/A   Social History Main Topics  . Smoking status: Current Every Day Smoker    Packs/day: 0.50    Years: 25.00    Types: Cigarettes  . Smokeless tobacco: Never Used  . Alcohol use No  . Drug use: No  . Sexual activity: Yes    Birth control/ protection: Post-menopausal   Other Topics Concern  . None   Social History Narrative  . None    Review of Systems: General: Negative for anorexia, weight loss,  fever, chills, fatigue, weakness. ENT: Negative for hoarseness, difficulty swallowing. CV: Negative for chest pain, angina, palpitations, peripheral edema.  Respiratory: Negative for dyspnea at rest, cough, sputum, wheezing.  GI: See history of present illness. Endo: Negative for unusual weight change.  Heme: Negative for bruising or bleeding.   Physical Exam: BP 129/77   Pulse 86   Temp 98.9 F (37.2 C) (Oral)   Ht 4\' 11"  (1.499 m)   Wt 148 lb (67.1 kg)   BMI 29.89 kg/m  General:   Alert and oriented. Pleasant and cooperative. Well-nourished and well-developed.  Eyes:  Without icterus, sclera clear and conjunctiva pink.  Ears:  Normal auditory acuity. Cardiovascular:  S1, S2 present without murmurs appreciated. Extremities without clubbing or edema. Respiratory:  Clear to auscultation bilaterally. No wheezes, rales, or rhonchi. No distress.  Gastrointestinal:  +BS, soft, non-tender and non-distended. No HSM noted. No guarding or rebound. No masses appreciated.  Rectal:  Deferred  Musculoskalatal:  Symmetrical without gross deformities. Neurologic:  Alert and oriented x4;  grossly normal neurologically. Psych:  Alert and cooperative. Normal mood and affect. Heme/Lymph/Immune: No excessive bruising noted.    01/01/2017 11:17 AM   Disclaimer: This note was dictated with voice recognition software. Similar sounding words can inadvertently be transcribed and may not be corrected upon review.

## 2017-01-12 ENCOUNTER — Telehealth: Payer: Self-pay | Admitting: Internal Medicine

## 2017-01-12 MED ORDER — ELUXADOLINE 75 MG PO TABS: 1.0000 | ORAL_TABLET | Freq: Two times a day (BID) | ORAL | 3 refills | Status: DC

## 2017-01-12 NOTE — Telephone Encounter (Signed)
Viberzi faxed, pt notified

## 2017-01-12 NOTE — Telephone Encounter (Signed)
I have printed the prescription. Needs to be faxed to pharmacy.

## 2017-01-12 NOTE — Telephone Encounter (Signed)
Pt called to say that the Viberzi samples worked for her and she would like a prescription called into [place].

## 2017-01-12 NOTE — Addendum Note (Signed)
Addended by: Annitta Needs on: 01/12/2017 03:21 PM   Modules accepted: Orders

## 2017-01-12 NOTE — Telephone Encounter (Signed)
Routing to [facility] RX refill

## 2017-03-03 ENCOUNTER — Ambulatory Visit (INDEPENDENT_AMBULATORY_CARE_PROVIDER_SITE_OTHER): Payer: Medicare Other | Admitting: Nurse Practitioner

## 2017-03-03 ENCOUNTER — Encounter: Payer: Self-pay | Admitting: Nurse Practitioner

## 2017-03-03 VITALS — BP 127/76 | HR 67 | Temp 97.9°F | Ht 59.0 in | Wt 144.2 lb

## 2017-03-03 DIAGNOSIS — R110 Nausea: Secondary | ICD-10-CM | POA: Diagnosis not present

## 2017-03-03 DIAGNOSIS — R1030 Lower abdominal pain, unspecified: Secondary | ICD-10-CM

## 2017-03-03 DIAGNOSIS — R197 Diarrhea, unspecified: Secondary | ICD-10-CM

## 2017-03-03 NOTE — Assessment & Plan Note (Signed)
Abdominal pain that typically occurs with her diarrhea.  This pain generally improves with a bowel movement.  She has had some improvement with Viberzi.  I will increase her dose to 100 mg twice a day as per above.  Return for follow-up in 2 months.

## 2017-03-03 NOTE — Progress Notes (Signed)
cc'd to pcp 

## 2017-03-03 NOTE — Assessment & Plan Note (Signed)
Diarrhea seems to have improved on Viberzi 75 mg.  She still has significant breakthrough a couple to few times a month.  This includes abdominal pain, diarrhea, nausea.  Viberzi seems to have been effective at the 75 mg dose, to an extent.  I will give her samples of 100 mg to take twice a day to see if this improves her symptoms any further.  Continue Imodium as needed.  Return for follow-up in 2 months.  If symptoms persist despite this we will likely need to proceed with further evaluation.  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

## 2017-03-03 NOTE — Assessment & Plan Note (Signed)
Remittent persistent nausea typically worse when she is having diarrhea and abdominal pain.  Continue to monitor, further treatment of diarrhea and abdominal pain as per below.  Return for follow-up in 2 months.

## 2017-03-03 NOTE — Patient Instructions (Signed)
1. I am giving you samples of Viberzi 100 mg to take twice a day instead of the 75 mg dose. 2. You can use Imodium as needed for breakthrough symptoms. 3. Return for follow-up in 2 months. 4. Call us in 1-2 weeks and let us know if the increased dose of Viberzi is helping. 5. Call if you have any questions or concerns.

## 2017-03-03 NOTE — Progress Notes (Signed)
Referring Provider: Lemmie Evens, MD Primary Care Physician:  Lemmie Evens, MD Primary GI:  Dr. Gala Romney  Chief Complaint  Patient presents with  . Diarrhea    sometimes Viberzi helps  . Abdominal Pain  . Nausea    HPI:   Leah Lynch is a 71 y.o. female who presents for follow-up on abdominal pain, nausea, diarrhea.  The patient was last seen in our office 01/01/2017 for the same.  Colonoscopy up-to-date and next due in 2022.  At one point was dependent on Imodium.  Chronically has lower abdominal pain which improves with bowel movement.  All studies were performed and negative.  Bentyl was attempted which helped with cramps but not the diarrhea.  At her last visit she noted persistent diarrhea and abdominal pain.  Nausea was improved but still triggered by some specific medication.  Noted decreased appetite.  No other GI symptoms.  She was given samples of Viberzi 75 mg twice a day with food and requested 1-2-week progress report.  Adverse effects were reviewed.  Follow-up in 2 months.  She called 1-2 weeks later and indicated Viberzi was helping and a prescription was sent to her pharmacy.  Today she states she's doing ok overall. Viberzi has helped but not resolved. Had diarrhea 3-4 days ago. 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 She did have some scant toilet tissue hematochezia when she had diarrhea this past weekend (3-4 days ago). Has history of hemorrhoids since childbirth. She states her hemorrhoids flared when she was straining but when she stopped they went away. Typically has diarrhea Other than her recent episode, has diarrhea about 2-3 times a month. Has some nausea intermittently, typically worse when she's having upset bowels. Abdominal pain returns with diarrhea. Denies chest pain, dyspnea, dizziness, lightheadedness, syncope, near  syncope. Denies any other upper or lower GI symptoms.  Past Medical History:  Diagnosis Date  . Fibrocystic breast   . GERD (gastroesophageal reflux disease)   . Hyperlipidemia   . Hypertension   . Osteoarthritis   . Osteoporosis   . Shortness of breath dyspnea     Past Surgical History:  Procedure Laterality Date  . ABDOMINAL HYSTERECTOMY    . BREAST SURGERY     removal of cyst from right breast-fibrocystsic  . BUNIONECTOMY  12/19/2010   Procedure: Lillard Anes;  Surgeon: Marcheta Grammes;  Location: [facility];  Service: Orthopedics;  Laterality: Right;  [employer], [name] Bunionectomy  . BUNIONECTOMY  05/22/2011   Procedure: Lillard Anes;  Surgeon: Marcheta Grammes, DPM;  Location: [facility];  Service: Orthopedics;  Laterality: Left;  [name] Bunionectomy Left Foot  . CAPSULOTOMY  01/29/2012   Procedure: CAPSULOTOMY;  Surgeon: Marcheta Grammes, DPM;  Location: [facility];  Service: Orthopedics;  Laterality: Left;  . CATARACT EXTRACTION W/PHACO Right 08/09/2015   Procedure: CATARACT EXTRACTION PHACO AND INTRAOCULAR LENS PLACEMENT RIGHT EYE CDE=7.95;  Surgeon: Tonny Branch, MD;  Location: [facility];  Service: Ophthalmology;  Laterality: Right;  . CATARACT EXTRACTION W/PHACO Left 08/27/2015   Procedure: CATARACT EXTRACTION PHACO AND INTRAOCULAR LENS PLACEMENT LEFT EYE; CDE:  6.12;  Surgeon: Tonny Branch, MD;  Location: [facility];  Service: Ophthalmology;  Laterality: Left;  . DIAGNOSTIC LAPAROSCOPY    . FOOT GANGLION EXCISION     right foot  . METATARSAL OSTEOTOMY  05/22/2011   Procedure: METATARSAL OSTEOTOMY;  Surgeon: Marcheta Grammes, DPM;  Location: [facility];  Service: Orthopedics;  Laterality: Left;  [name] Osteotomy Left Foot  . TONSILLECTOMY  age 52  . TUBAL LIGATION      Current Outpatient Medications  Medication Sig Dispense Refill  . alendronate (FOSAMAX) 70 MG tablet every 7 (seven) days.    [name] atenolol-chlorthalidone (TENORETIC) 50-25 MG per tablet Take 0.5 tablets  by mouth daily.     [name] atorvastatin (LIPITOR) 40 MG tablet Take 1 tablet by mouth daily.    . B Complex-C (SUPER B COMPLEX PO) Take 1 tablet by mouth daily.     . celecoxib (CELEBREX) 200 MG capsule Take 1 capsule by mouth daily.    . Cholecalciferol (VITAMIN D-3 PO) Take 1 tablet by mouth daily.    . Chromium 1000 MCG TABS Take by mouth daily.    . Eluxadoline (VIBERZI) 75 MG TABS Take 1 tablet by mouth 2 (two) times daily with a meal. 60 tablet 3  . HYDROcodone-acetaminophen (NORCO) 10-325 MG per tablet Take 1 tablet by mouth 3 (three) times daily. Sometimes takes 2/day    . loperamide (IMODIUM) 2 MG capsule Take 2 mg by mouth as needed for diarrhea or loose stools.    [name] LORazepam (ATIVAN) 1 MG tablet Take 0.5-1 tablets by mouth as needed.     [name] Lysine HCl 500 MG TABS Take 1-2 tablets by mouth at bedtime as needed.    . naproxen (NAPROSYN) 500 MG tablet Take 1 tablet (500 mg total) by mouth 2 (two) times daily with a meal. Take with food (Patient taking differently: Take 500 mg by mouth as needed. Take with food) 14 tablet 0  . omeprazole (PRILOSEC) 20 MG capsule Take 20 mg by mouth daily.    . potassium chloride (KLOR-CON) 10 MEQ CR tablet Take 20 mEq by mouth daily.     [name] PROAIR HFA 108 (90 Base) MCG/ACT inhaler Inhale 2 puffs into the lungs every 6 (six) hours as needed for wheezing or shortness of breath.     . traZODone (DESYREL) 50 MG tablet Take 1-2 tablets by mouth as needed.      No current facility-administered medications for this visit.     Allergies as of 03/03/2017 - Review Complete 03/03/2017  Allergen Reaction Noted  . Bee venom Swelling 11/11/2010  . Penicillins Hives and Itching 06/27/2010  . Sulfa antibiotics Itching 11/11/2010    Family History  Problem Relation Age of Onset  . Anesthesia problems Neg Hx   . Hypotension Neg Hx   . Malignant hyperthermia Neg Hx   . Pseudochol deficiency Neg Hx   . Colon cancer Neg Hx     Social History   Socioeconomic History   . Marital status: Divorced    Spouse name: None  . Number of children: None  . Years of education: None  . Highest education level: None  Social Needs  . Financial resource strain: None  . Food insecurity - worry: None  . Food insecurity - inability: None  . Transportation needs - medical: None  . Transportation needs - non-medical: None  Occupational History  . None  Tobacco Use  . Smoking status: Current Every Day Smoker    Packs/day: 0.50    Years: 25.00    Pack years: 12.50    Types: Cigarettes  . Smokeless tobacco: Never Used  Substance and Sexual Activity  . Alcohol use: No  . Drug use: No  . Sexual activity: Yes  Birth control/protection: Post-menopausal  Other Topics Concern  . None  Social History Narrative  . None    Review of Systems: General: Negative for anorexia, weight loss, fever, chills, fatigue, weakness. ENT: Negative for hoarseness, difficulty swallowing. CV: Negative for chest pain, angina, palpitations, peripheral edema.  Respiratory: Negative for dyspnea at rest, cough, sputum, wheezing.  GI: See history of present illness. Endo: Negative for unusual weight change.  Heme: Negative for bruising or bleeding.  Physical Exam: BP 127/76   Pulse 67   Temp 97.9 F (36.6 C) (Oral)   Ht 4\' 11"  (1.499 m)   Wt 144 lb 3.2 oz (65.4 kg)   BMI 29.12 kg/m  General:   Alert and oriented. Pleasant and cooperative. Well-nourished and well-developed.  Eyes:  Without icterus, sclera clear and conjunctiva pink.  Ears:  Normal auditory acuity. Cardiovascular:  S1, S2 present without murmurs appreciated. Extremities without clubbing or edema. Respiratory:  Clear to auscultation bilaterally. No wheezes, rales, or rhonchi. No distress.  Gastrointestinal:  +BS, soft, and non-distended. Mild generalized TTP. No HSM noted. No guarding or rebound. No masses appreciated.  Rectal:  Deferred  Musculoskalatal:  Symmetrical without gross deformities. Neurologic:   Alert and oriented x4;  grossly normal neurologically. Psych:  Alert and cooperative. Normal mood and affect. Heme/Lymph/Immune: No excessive bruising noted.    03/03/2017 9:10 AM   Disclaimer: This note was dictated with voice recognition software. Similar sounding words can inadvertently be transcribed and may not be corrected upon review.

## 2017-03-13 ENCOUNTER — Ambulatory Visit (HOSPITAL_COMMUNITY)
Admission: RE | Admit: 2017-03-13 | Discharge: 2017-03-13 | Disposition: A | Discharge status: Home / Self Care | Payer: Medicare Other | Source: Ambulatory Visit | Attending: Family Medicine | Admitting: Family Medicine

## 2017-03-13 ENCOUNTER — Other Ambulatory Visit (HOSPITAL_COMMUNITY): Payer: Self-pay | Admitting: Family Medicine

## 2017-03-13 DIAGNOSIS — M19011 Primary osteoarthritis, right shoulder: Secondary | ICD-10-CM | POA: Diagnosis not present

## 2017-03-13 DIAGNOSIS — M25511 Pain in right shoulder: Secondary | ICD-10-CM | POA: Diagnosis present

## 2017-03-16 ENCOUNTER — Telehealth: Payer: Self-pay | Admitting: Nurse Practitioner

## 2017-03-16 NOTE — Telephone Encounter (Signed)
lmom to let pt know that I'm sending this message to the refill line. If pt hasn't received her medication sometime this week due to the holiday, pt is to call back.

## 2017-03-16 NOTE — Telephone Encounter (Signed)
[phone] PATIENT CALLED AND STATED THE SAMPLES IF VIBERZI WORKED AND WOULD LIKE A PRESCRIPTION SENT TO [facility]

## 2017-03-18 MED ORDER — ELUXADOLINE 100 MG PO TABS: 1.0000 | ORAL_TABLET | Freq: Two times a day (BID) | ORAL | 2 refills | Status: AC

## 2017-03-18 NOTE — Telephone Encounter (Signed)
Noted, rx faxed to pts pharmacy.

## 2017-03-18 NOTE — Telephone Encounter (Signed)
I have printed prescription. It can't be sent electronically. Please send to pharmacy. Copying Eric as FYI, as the 100 mg Viberzi worked well and he last saw patient.

## 2017-03-18 NOTE — Addendum Note (Signed)
Addended by: Annitta Needs on: 03/18/2017 08:32 AM   Modules accepted: Orders

## 2017-05-06 ENCOUNTER — Ambulatory Visit (INDEPENDENT_AMBULATORY_CARE_PROVIDER_SITE_OTHER): Payer: Medicare Other | Admitting: Nurse Practitioner

## 2017-05-06 ENCOUNTER — Encounter: Payer: Self-pay | Admitting: Nurse Practitioner

## 2017-05-06 VITALS — BP 129/72 | HR 64 | Temp 98.1°F | Ht 59.0 in | Wt 146.4 lb

## 2017-05-06 DIAGNOSIS — R197 Diarrhea, unspecified: Secondary | ICD-10-CM

## 2017-05-06 DIAGNOSIS — R1030 Lower abdominal pain, unspecified: Secondary | ICD-10-CM | POA: Diagnosis not present

## 2017-05-06 NOTE — Patient Instructions (Signed)
1. Continue taking Viberzi. 2. You can use Imodium as needed if you do have a breakthrough episode of diarrhea. 3. Return for follow-up in 6 months. 4. Call us if you have any questions or concerns.

## 2017-05-06 NOTE — Progress Notes (Signed)
cc'ed to pcp °

## 2017-05-06 NOTE — Assessment & Plan Note (Addendum)
Diarrhea has essentially resolved on Viberzi 100 mg twice a day.  She has Imodium on hand in case she has a breakthrough.  No significant breakthrough since she has started the increased dose.  Recommend continue Viberzi 100 mg twice a day, follow-up in 6 months.  No adverse effects noted thus far.  Gallbladder remains in situ.  Call if she has any recurrent symptoms or problems.

## 2017-05-06 NOTE — Progress Notes (Signed)
Referring Provider: Lemmie Evens, MD Primary Care Physician:  Lemmie Evens, MD Primary GI:  Dr. Gala Romney  Chief Complaint  Patient presents with  . Abdominal Pain    f/u. Improved  . Diarrhea    f/u. Improved. Will have occas breakthrough    HPI:   Leah Lynch is a 72 y.o. female who presents for follow-up on diarrhea and abdominal pain.  The patient was last seen in our office on 03/03/2017 for nausea and vomiting, diarrhea, lower abdominal pain.  Colonoscopy up-to-date, next due 2022.  History of lower abdominal pain improved with bowel movement, stool studies performed and negative.  Bentyl helped with cramps but not diarrhea.  She was trialed on Viberzi 75 mg and at one point indicated it was helping and a prescription was sent to her pharmacy.  At her last visit she noted the Viberzi did help, but did not resolve her symptoms.  She did have some diarrhea 3-4 days prior.  She does have a food phobia due to being afraid that she will have to go to the bathroom.  Scant toilet tissue hematochezia with her episode of diarrhea.  History of hemorrhoids since childbirth which flare when straining.  Other than her recent episode, diarrhea has decreased to 2-3 times a month.  Intermittent nausea typically worse when she is having upset bowels.  No other GI symptoms.  Commended increase Viberzi to 100 mg twice a day, Imodium for breakthrough.  Follow-up in 2 months.  Requested progress report on effectiveness of increased Viberzi dose.  She called about 1 week later and indicated the dose increase has helped.  A prescription was sent to her pharmacy.  Today she states she's doing much better. No more diarrhea. Abdominal pain has resolved with this as well. On the very rare occasion of breakthrough Imodium helps. Denies hematochezia, melena, fever, chills, unintentional weight loss. Denies chest pain, dyspnea, dizziness, lightheadedness, syncope, near syncope. Denies any other upper or  lower GI symptoms.  Past Medical History:  Diagnosis Date  . Fibrocystic breast   . GERD (gastroesophageal reflux disease)   . Hyperlipidemia   . Hypertension   . Osteoarthritis   . Osteoporosis   . Shortness of breath dyspnea     Past Surgical History:  Procedure Laterality Date  . ABDOMINAL HYSTERECTOMY    . BREAST SURGERY     removal of cyst from right breast-fibrocystsic  . BUNIONECTOMY  12/19/2010   Procedure: Lillard Anes;  Surgeon: Marcheta Grammes;  Location: [facility];  Service: Orthopedics;  Laterality: Right;  [employer], [name] Bunionectomy  . BUNIONECTOMY  05/22/2011   Procedure: Lillard Anes;  Surgeon: Marcheta Grammes, DPM;  Location: [facility];  Service: Orthopedics;  Laterality: Left;  [name] Bunionectomy Left Foot  . CAPSULOTOMY  01/29/2012   Procedure: CAPSULOTOMY;  Surgeon: Marcheta Grammes, DPM;  Location: [facility];  Service: Orthopedics;  Laterality: Left;  . CATARACT EXTRACTION W/PHACO Right 08/09/2015   Procedure: CATARACT EXTRACTION PHACO AND INTRAOCULAR LENS PLACEMENT RIGHT EYE CDE=7.95;  Surgeon: Tonny Branch, MD;  Location: [facility];  Service: Ophthalmology;  Laterality: Right;  . CATARACT EXTRACTION W/PHACO Left 08/27/2015   Procedure: CATARACT EXTRACTION PHACO AND INTRAOCULAR LENS PLACEMENT LEFT EYE; CDE:  6.12;  Surgeon: Tonny Branch, MD;  Location: [facility];  Service: Ophthalmology;  Laterality: Left;  . DIAGNOSTIC LAPAROSCOPY    . FOOT GANGLION EXCISION     right foot  . METATARSAL OSTEOTOMY  05/22/2011   Procedure: METATARSAL OSTEOTOMY;  Surgeon:  Marcheta Grammes, DPM;  Location: [facility];  Service: Orthopedics;  Laterality: Left;  [name] Osteotomy Left Foot  . TONSILLECTOMY  age 73  . TUBAL LIGATION      Current Outpatient Medications  Medication Sig Dispense Refill  . alendronate (FOSAMAX) 70 MG tablet every 7 (seven) days.    [name] atenolol-chlorthalidone (TENORETIC) 50-25 MG per tablet Take 0.5 tablets by mouth daily.     [name] atorvastatin  (LIPITOR) 40 MG tablet Take 1 tablet by mouth daily.    . B Complex-C (SUPER B COMPLEX PO) Take 1 tablet by mouth daily.     . celecoxib (CELEBREX) 200 MG capsule Take 1 capsule by mouth daily.    . Cholecalciferol (VITAMIN D-3 PO) Take 1 tablet by mouth daily.    . Chromium 1000 MCG TABS Take by mouth daily.    . Eluxadoline (VIBERZI) 100 MG TABS Take 1 tablet by mouth 2 (two) times daily.    [name] HYDROcodone-acetaminophen (NORCO) 10-325 MG per tablet Take 1 tablet by mouth 4 (four) times daily. Sometimes takes 2/day    . loperamide (IMODIUM) 2 MG capsule Take 2 mg by mouth as needed for diarrhea or loose stools.    [name] LORazepam (ATIVAN) 1 MG tablet Take 0.5-1 tablets by mouth as needed.     [name] Lysine HCl 500 MG TABS Take 1-2 tablets by mouth at bedtime as needed.    . naproxen (NAPROSYN) 500 MG tablet Take 1 tablet (500 mg total) by mouth 2 (two) times daily with a meal. Take with food (Patient taking differently: Take 500 mg by mouth as needed. Take with food) 14 tablet 0  . omeprazole (PRILOSEC) 20 MG capsule Take 20 mg by mouth daily.    . potassium chloride (KLOR-CON) 10 MEQ CR tablet Take 20 mEq by mouth daily.     [name] PROAIR HFA 108 (90 Base) MCG/ACT inhaler Inhale 2 puffs into the lungs every 6 (six) hours as needed for wheezing or shortness of breath.     . traZODone (DESYREL) 50 MG tablet Take 1-2 tablets by mouth as needed.      No current facility-administered medications for this visit.     Allergies as of 05/06/2017 - Review Complete 05/06/2017  Allergen Reaction Noted  . Bee venom Swelling 11/11/2010  . Penicillins Hives and Itching 06/27/2010  . Sulfa antibiotics Itching 11/11/2010    Family History  Problem Relation Age of Onset  . Anesthesia problems Neg Hx   . Hypotension Neg Hx   . Malignant hyperthermia Neg Hx   . Pseudochol deficiency Neg Hx   . Colon cancer Neg Hx     Social History   Socioeconomic History  . Marital status: Divorced    Spouse name: None  .  Number of children: None  . Years of education: None  . Highest education level: None  Social Needs  . Financial resource strain: None  . Food insecurity - worry: None  . Food insecurity - inability: None  . Transportation needs - medical: None  . Transportation needs - non-medical: None  Occupational History  . None  Tobacco Use  . Smoking status: Current Every Day Smoker    Packs/day: 0.50    Years: 25.00    Pack years: 12.50    Types: Cigarettes  . Smokeless tobacco: Never Used  Substance and Sexual Activity  . Alcohol use: No  . Drug use: No  . Sexual activity: Yes    Birth control/protection: Post-menopausal  Other  Topics Concern  . None  Social History Narrative  . None    Review of Systems: General: Negative for anorexia, weight loss, fever, chills, fatigue, weakness. ENT: Negative for hoarseness, difficulty swallowing ,. CV: Negative for chest pain, angina, palpitations, peripheral edema.  Respiratory: Negative for dyspnea at rest, cough, sputum, wheezing.  GI: See history of present illness. Endo: Negative for unusual weight change.  Heme: Negative for bruising or bleeding.  Physical Exam: BP 129/72   Pulse 64   Temp 98.1 F (36.7 C) (Oral)   Ht 4\' 11"  (1.499 m)   Wt 146 lb 6.4 oz (66.4 kg)   BMI 29.57 kg/m  General:   Alert and oriented. Pleasant and cooperative. Well-nourished and well-developed.  Head:  Normocephalic and atraumatic. Eyes:  Without icterus, sclera clear and conjunctiva pink.  Ears:  Normal auditory acuity. Cardiovascular:  S1, S2 present without murmurs appreciated. Extremities without clubbing or edema. Respiratory:  Clear to auscultation bilaterally. No wheezes, rales, or rhonchi. No distress.  Gastrointestinal:  +BS, soft, non-tender and non-distended. No HSM noted. No guarding or rebound. No masses appreciated.  Rectal:  Deferred  Musculoskalatal:  Symmetrical without gross deformities. Neurologic:  Alert and oriented x4;   grossly normal neurologically. Psych:  Alert and cooperative. Normal mood and affect. Heme/Lymph/Immune: No excessive bruising noted.    05/06/2017 1:47 PM   Disclaimer: This note was dictated with voice recognition software. Similar sounding words can inadvertently be transcribed and may not be corrected upon review.

## 2017-05-06 NOTE — Assessment & Plan Note (Signed)
Abdominal pain has resolved with resolution of her diarrhea.  Viberzi is working well for her.  No adverse effects as far, gallbladder remains in situ.  Recommend continue Viberzi as prescribed.  Return for follow-up in 6 months.

## 2017-06-02 ENCOUNTER — Other Ambulatory Visit (HOSPITAL_COMMUNITY): Payer: Self-pay | Admitting: Family Medicine

## 2017-06-02 DIAGNOSIS — Z1231 Encounter for screening mammogram for malignant neoplasm of breast: Secondary | ICD-10-CM

## 2017-06-11 ENCOUNTER — Other Ambulatory Visit (HOSPITAL_COMMUNITY): Payer: Self-pay | Admitting: Family Medicine

## 2017-06-11 ENCOUNTER — Other Ambulatory Visit: Payer: Self-pay | Admitting: Gastroenterology

## 2017-06-11 ENCOUNTER — Ambulatory Visit (HOSPITAL_COMMUNITY)
Admission: RE | Admit: 2017-06-11 | Discharge: 2017-06-11 | Disposition: A | Discharge status: Home / Self Care | Payer: Medicare Other | Source: Ambulatory Visit | Attending: Family Medicine | Admitting: Family Medicine

## 2017-06-11 DIAGNOSIS — Z77090 Contact with and (suspected) exposure to asbestos: Secondary | ICD-10-CM

## 2017-06-11 DIAGNOSIS — Z1231 Encounter for screening mammogram for malignant neoplasm of breast: Secondary | ICD-10-CM | POA: Diagnosis present

## 2017-11-04 ENCOUNTER — Ambulatory Visit (INDEPENDENT_AMBULATORY_CARE_PROVIDER_SITE_OTHER): Payer: Medicare Other | Admitting: Gastroenterology

## 2017-11-04 ENCOUNTER — Encounter: Payer: Self-pay | Admitting: Gastroenterology

## 2017-11-04 VITALS — BP 121/77 | HR 73 | Temp 97.7°F | Ht 59.0 in | Wt 140.0 lb

## 2017-11-04 DIAGNOSIS — R197 Diarrhea, unspecified: Secondary | ICD-10-CM | POA: Diagnosis not present

## 2017-11-04 NOTE — Progress Notes (Signed)
Referring Provider: Lemmie Evens, MD Primary Care Physician:  Lemmie Evens, MD Primary GI: Dr. Gala Romney   Chief Complaint  Patient presents with  . Diarrhea    f/u.   [name] Abdominal Pain    doing okay    HPI:   Leah Lynch is a 72 y.o. female presenting today with a history of IBS. Colonoscopy last completed in 2012 and due again in 2022.   Viberzi 100 mg one every other day as she was constipated and felt full.   Decreased appetite. Weight loss noted. No abdominal pain. Forgets to eat.   May 2017 was 153. Aug 2018 in the upper 140s. Feb 2019 was 146. Today 140.    Past Medical History:  Diagnosis Date  . Fibrocystic breast   . GERD (gastroesophageal reflux disease)   . Hyperlipidemia   . Hypertension   . Osteoarthritis   . Osteoporosis   . Shortness of breath dyspnea     Past Surgical History:  Procedure Laterality Date  . ABDOMINAL HYSTERECTOMY    . BREAST SURGERY     removal of cyst from right breast-fibrocystsic  . BUNIONECTOMY  12/19/2010   Procedure: Lillard Anes;  Surgeon: Marcheta Grammes;  Location: [facility];  Service: Orthopedics;  Laterality: Right;  [employer], [name] Bunionectomy  . BUNIONECTOMY  05/22/2011   Procedure: Lillard Anes;  Surgeon: Marcheta Grammes, DPM;  Location: [facility];  Service: Orthopedics;  Laterality: Left;  [name] Bunionectomy Left Foot  . CAPSULOTOMY  01/29/2012   Procedure: CAPSULOTOMY;  Surgeon: Marcheta Grammes, DPM;  Location: [facility];  Service: Orthopedics;  Laterality: Left;  . CATARACT EXTRACTION W/PHACO Right 08/09/2015   Procedure: CATARACT EXTRACTION PHACO AND INTRAOCULAR LENS PLACEMENT RIGHT EYE CDE=7.95;  Surgeon: Tonny Branch, MD;  Location: [facility];  Service: Ophthalmology;  Laterality: Right;  . CATARACT EXTRACTION W/PHACO Left 08/27/2015   Procedure: CATARACT EXTRACTION PHACO AND INTRAOCULAR LENS PLACEMENT LEFT EYE; CDE:  6.12;  Surgeon: Tonny Branch, MD;  Location: [facility];  Service:  Ophthalmology;  Laterality: Left;  . COLONOSCOPY  2012   Dr. Gala Romney: internal hemorrhoids, hyperplastic polyps. Surveillance 2022  . DIAGNOSTIC LAPAROSCOPY    . FOOT GANGLION EXCISION     right foot  . METATARSAL OSTEOTOMY  05/22/2011   Procedure: METATARSAL OSTEOTOMY;  Surgeon: Marcheta Grammes, DPM;  Location: [facility];  Service: Orthopedics;  Laterality: Left;  [name] Osteotomy Left Foot  . TONSILLECTOMY  age 40  . TUBAL LIGATION      Current Outpatient Medications  Medication Sig Dispense Refill  . alendronate (FOSAMAX) 70 MG tablet every 7 (seven) days.    [name] atenolol-chlorthalidone (TENORETIC) 50-25 MG per tablet Take 0.5 tablets by mouth daily.     [name] atorvastatin (LIPITOR) 40 MG tablet Take 1 tablet by mouth daily.    . B Complex-C (SUPER B COMPLEX PO) Take 1 tablet by mouth daily.     . celecoxib (CELEBREX) 200 MG capsule Take 1 capsule by mouth daily.    . Cholecalciferol (VITAMIN D-3 PO) Take 1 tablet by mouth daily.    . Chromium 1000 MCG TABS Take by mouth daily.    [name] HYDROcodone-acetaminophen (NORCO) 10-325 MG per tablet Take 1 tablet by mouth 4 (four) times daily. Sometimes takes 2/day    . loperamide (IMODIUM) 2 MG capsule Take 2 mg by mouth as needed for diarrhea or loose stools.    [name] LORazepam (ATIVAN) 1 MG tablet Take 0.5-1 tablets by mouth as needed.     [name]  Lysine HCl 500 MG TABS Take 1-2 tablets by mouth at bedtime as needed.    . naproxen (NAPROSYN) 500 MG tablet Take 1 tablet (500 mg total) by mouth 2 (two) times daily with a meal. Take with food (Patient taking differently: Take 500 mg by mouth as needed. Take with food) 14 tablet 0  . omeprazole (PRILOSEC) 20 MG capsule Take 20 mg by mouth daily.    . potassium chloride (KLOR-CON) 10 MEQ CR tablet Take 20 mEq by mouth daily.     [name] PROAIR HFA 108 (90 Base) MCG/ACT inhaler Inhale 2 puffs into the lungs every 6 (six) hours as needed for wheezing or shortness of breath.     . VIBERZI 100 MG TABS TAKE 1 TABLET BY MOUTH  TWICE DAILY WITH MEALS. (Patient taking differently: 1 tablet every other day) 60 tablet 3  . traZODone (DESYREL) 50 MG tablet Take 1-2 tablets by mouth as needed.      No current facility-administered medications for this visit.     Allergies as of 11/04/2017 - Review Complete 11/04/2017  Allergen Reaction Noted  . Bee venom Swelling 11/11/2010  . Penicillins Hives and Itching 06/27/2010  . Sulfa antibiotics Itching 11/11/2010    Family History  Problem Relation Age of Onset  . Anesthesia problems Neg Hx   . Hypotension Neg Hx   . Malignant hyperthermia Neg Hx   . Pseudochol deficiency Neg Hx   . Colon cancer Neg Hx     Social History   Socioeconomic History  . Marital status: Divorced    Spouse name: Not on file  . Number of children: Not on file  . Years of education: Not on file  . Highest education level: Not on file  Occupational History  . Not on file  Social Needs  . Financial resource strain: Not on file  . Food insecurity:    Worry: Not on file    Inability: Not on file  . Transportation needs:    Medical: Not on file    Non-medical: Not on file  Tobacco Use  . Smoking status: Current Every Day Smoker    Packs/day: 0.50    Years: 25.00    Pack years: 12.50    Types: Cigarettes  . Smokeless tobacco: Never Used  Substance and Sexual Activity  . Alcohol use: No  . Drug use: No  . Sexual activity: Yes    Birth control/protection: Post-menopausal  Lifestyle  . Physical activity:    Days per week: Not on file    Minutes per session: Not on file  . Stress: Not on file  Relationships  . Social connections:    Talks on phone: Not on file    Gets together: Not on file    Attends religious service: Not on file    Active member of club or organization: Not on file    Attends meetings of clubs or organizations: Not on file    Relationship status: Not on file  Other Topics Concern  . Not on file  Social History Narrative  . Not on file    Review of  Systems: Gen: Denies fever, chills, anorexia. Denies fatigue, weakness, weight loss.  CV: Denies chest pain, palpitations, syncope, peripheral edema, and claudication. Resp: Denies dyspnea at rest, cough, wheezing, coughing up blood, and pleurisy. GI: see HPI  Derm: Denies rash, itching, dry skin Psych: Denies depression, anxiety, memory loss, confusion. No homicidal or suicidal ideation.  Heme: Denies bruising, bleeding, and enlarged lymph  nodes.  Physical Exam: BP 121/77   Pulse 73   Temp 97.7 F (36.5 C) (Oral)   Ht 4\' 11"  (1.499 m)   Wt 140 lb (63.5 kg)   BMI 28.28 kg/m  General:   Alert and oriented. No distress noted. Pleasant and cooperative.  Head:  Normocephalic and atraumatic. Eyes:  Conjuctiva clear without scleral icterus. Mouth:  Oral mucosa pink and moist.  Abdomen:  +BS, soft, non-tender and non-distended. No rebound or guarding. No HSM or masses noted. Msk:  Symmetrical without gross deformities. Normal posture. Extremities:  Without edema. Neurologic:  Alert and  oriented x4 Psych:  Alert and cooperative. Normal mood and affect.

## 2017-11-04 NOTE — Patient Instructions (Addendum)
I have given you samples of Viberzi lower dosing (75 milligrams) to take once to twice a day with food. Monitor for constipation, abdominal pain.   Let me know if this works better for you!  Please call if your weight continues to go down or your appetite is not good. We will see you sooner. Otherwise, I do want to see you in 3 months to ensure all is going well.   It was a pleasure to see you today. I strive to create trusting relationships with patients to provide genuine, compassionate, and quality care. I value your feedback. If you receive a survey regarding your visit,  I greatly appreciate you taking time to fill this out.   Annitta Needs, PhD, ANP-BC [facility]

## 2017-11-09 NOTE — Assessment & Plan Note (Signed)
72 year old female with history of diarrhea that improved historically on Viberzi, but now seems to be overshooting the mark. I have provided samples of Viberzi 75 mg to take once to twice a day. Slow weight loss concerning, but she denies any abdominal pain. May need to consider imaging versus updated colonoscopy in near future. Follow-up in 3 months for close monitoring.

## 2017-11-10 NOTE — Progress Notes (Signed)
cc'd to pcp 

## 2017-11-18 ENCOUNTER — Telehealth: Payer: Self-pay | Admitting: Internal Medicine

## 2017-11-18 NOTE — Telephone Encounter (Signed)
Patient called and said viberzi worked and she wants the 100mg  and she will have her pharmacy send [place] a refill request

## 2017-11-18 NOTE — Telephone Encounter (Signed)
Pt took Viberzi 75 mg samples. Pt would like to stay with Viberi 100mg  and would like it called into her pharmacy.

## 2017-11-23 ENCOUNTER — Other Ambulatory Visit: Payer: Self-pay | Admitting: Gastroenterology

## 2017-11-24 NOTE — Telephone Encounter (Signed)
Routing message to refill box

## 2017-11-25 NOTE — Telephone Encounter (Signed)
I have printed prescription, just needs to be faxed.

## 2017-11-25 NOTE — Telephone Encounter (Signed)
I have faxed the Rx to [facility] [place].

## 2017-11-25 NOTE — Telephone Encounter (Signed)
LMOM for pt that the Rx has been sent in.

## 2017-12-30 ENCOUNTER — Encounter: Payer: Self-pay | Admitting: Orthopaedic Surgery

## 2017-12-30 ENCOUNTER — Ambulatory Visit (INDEPENDENT_AMBULATORY_CARE_PROVIDER_SITE_OTHER): Payer: Medicare Other | Admitting: Orthopaedic Surgery

## 2017-12-30 ENCOUNTER — Ambulatory Visit (INDEPENDENT_AMBULATORY_CARE_PROVIDER_SITE_OTHER): Payer: Medicare Other

## 2017-12-30 VITALS — BP 121/70 | HR 70 | Ht 59.0 in | Wt 135.0 lb

## 2017-12-30 DIAGNOSIS — M65311 Trigger thumb, right thumb: Secondary | ICD-10-CM

## 2017-12-30 DIAGNOSIS — S62662A Nondisplaced fracture of distal phalanx of right middle finger, initial encounter for closed fracture: Secondary | ICD-10-CM

## 2017-12-30 DIAGNOSIS — M79644 Pain in right finger(s): Secondary | ICD-10-CM | POA: Diagnosis not present

## 2017-12-30 NOTE — Progress Notes (Signed)
Subjective:    Patient ID: Leah Lynch, female    DOB: [birth year], 72 y.o.   MRN: [id number]  HPI She has two problems:  First, she has locking of the right dominant thumb over the last month to six weeks getting worse.  It pops.  It hurts.  It is popping more and more.  She has no trauma, no redness, no numbness.  She fell and hit her right long finger about ten days ago or so and has pain of the DIP joint dorsally. She has swelling but no redness.  She can move her finger but it hurts.  She has no numbness.   Review of Systems  Constitutional: Positive for activity change.  Musculoskeletal: Positive for arthralgias and joint swelling.  All other systems reviewed and are negative.  For Review of Systems, all other systems reviewed and are negative.  The following is a summary of the past history medically, past history surgically, known current medicines, social history and family history.  This information is gathered electronically by the computer from prior information and documentation.  I review this each visit and have found including this information at this point in the chart is beneficial and informative.   Past Medical History:  Diagnosis Date  . Fibrocystic breast   . GERD (gastroesophageal reflux disease)   . Hyperlipidemia   . Hypertension   . Osteoarthritis   . Osteoporosis   . Shortness of breath dyspnea     Past Surgical History:  Procedure Laterality Date  . ABDOMINAL HYSTERECTOMY    . BREAST SURGERY     removal of cyst from right breast-fibrocystsic  . BUNIONECTOMY  12/19/2010   Procedure: Lillard Anes;  Surgeon: Marcheta Grammes;  Location: [facility];  Service: Orthopedics;  Laterality: Right;  [employer], [name] Bunionectomy  . BUNIONECTOMY  05/22/2011   Procedure: Lillard Anes;  Surgeon: Marcheta Grammes, DPM;  Location: [facility];  Service: Orthopedics;  Laterality: Left;  [name] Bunionectomy Left Foot  . CAPSULOTOMY  01/29/2012   Procedure: CAPSULOTOMY;  Surgeon: Marcheta Grammes, DPM;  Location: [facility];  Service: Orthopedics;  Laterality: Left;  . CATARACT EXTRACTION W/PHACO Right 08/09/2015   Procedure: CATARACT EXTRACTION PHACO AND INTRAOCULAR LENS PLACEMENT RIGHT EYE CDE=7.95;  Surgeon: Tonny Branch, MD;  Location: [facility];  Service: Ophthalmology;  Laterality: Right;  . CATARACT EXTRACTION W/PHACO Left 08/27/2015   Procedure: CATARACT EXTRACTION PHACO AND INTRAOCULAR LENS PLACEMENT LEFT EYE; CDE:  6.12;  Surgeon: Tonny Branch, MD;  Location: [facility];  Service: Ophthalmology;  Laterality: Left;  . COLONOSCOPY  2012   Dr. Gala Romney: internal hemorrhoids, hyperplastic polyps. Surveillance 2022  . DIAGNOSTIC LAPAROSCOPY    . FOOT GANGLION EXCISION     right foot  . METATARSAL OSTEOTOMY  05/22/2011   Procedure: METATARSAL OSTEOTOMY;  Surgeon: Marcheta Grammes, DPM;  Location: [facility];  Service: Orthopedics;  Laterality: Left;  [name] Osteotomy Left Foot  . TONSILLECTOMY  age 38  . TUBAL LIGATION      Current Outpatient Medications on File Prior to Visit  Medication Sig Dispense Refill  . alendronate (FOSAMAX) 70 MG tablet every 7 (seven) days.    [name] atenolol-chlorthalidone (TENORETIC) 50-25 MG per tablet Take 0.5 tablets by mouth daily.     [name] atorvastatin (LIPITOR) 40 MG tablet Take 1 tablet by mouth daily.    . B Complex-C (SUPER B COMPLEX PO) Take 1 tablet by mouth daily.     . celecoxib (CELEBREX) 200 MG capsule Take  1 capsule by mouth daily.    . Cholecalciferol (VITAMIN D-3 PO) Take 1 tablet by mouth daily.    . Chromium 1000 MCG TABS Take by mouth daily.    [name] HYDROcodone-acetaminophen (NORCO) 10-325 MG per tablet Take 1 tablet by mouth 4 (four) times daily. Sometimes takes 2/day    . loperamide (IMODIUM) 2 MG capsule Take 2 mg by mouth as needed for diarrhea or loose stools.    [name] LORazepam (ATIVAN) 1 MG tablet Take 0.5-1 tablets by mouth as needed.     [name] Lysine HCl 500 MG TABS Take 1-2 tablets by mouth at bedtime  as needed.    . naproxen (NAPROSYN) 500 MG tablet Take 1 tablet (500 mg total) by mouth 2 (two) times daily with a meal. Take with food (Patient taking differently: Take 500 mg by mouth as needed. Take with food) 14 tablet 0  . omeprazole (PRILOSEC) 20 MG capsule Take 20 mg by mouth daily.    . potassium chloride (KLOR-CON) 10 MEQ CR tablet Take 20 mEq by mouth daily.     [name] PROAIR HFA 108 (90 Base) MCG/ACT inhaler Inhale 2 puffs into the lungs every 6 (six) hours as needed for wheezing or shortness of breath.     . VIBERZI 100 MG TABS TAKE 1 TABLET BY MOUTH TWICE DAILY WITH MEALS. 60 tablet 3   No current facility-administered medications on file prior to visit.     Social History   Socioeconomic History  . Marital status: Divorced    Spouse name: Not on file  . Number of children: Not on file  . Years of education: Not on file  . Highest education level: Not on file  Occupational History  . Not on file  Social Needs  . Financial resource strain: Not on file  . Food insecurity:    Worry: Not on file    Inability: Not on file  . Transportation needs:    Medical: Not on file    Non-medical: Not on file  Tobacco Use  . Smoking status: Current Every Day Smoker    Packs/day: 0.50    Years: 25.00    Pack years: 12.50    Types: Cigarettes  . Smokeless tobacco: Never Used  Substance and Sexual Activity  . Alcohol use: No  . Drug use: No  . Sexual activity: Yes    Birth control/protection: Post-menopausal  Lifestyle  . Physical activity:    Days per week: Not on file    Minutes per session: Not on file  . Stress: Not on file  Relationships  . Social connections:    Talks on phone: Not on file    Gets together: Not on file    Attends religious service: Not on file    Active member of club or organization: Not on file    Attends meetings of clubs or organizations: Not on file    Relationship status: Not on file  . Intimate partner violence:    Fear of current or ex partner:  Not on file    Emotionally abused: Not on file    Physically abused: Not on file    Forced sexual activity: Not on file  Other Topics Concern  . Not on file  Social History Narrative  . Not on file    Family History  Problem Relation Age of Onset  . Anesthesia problems Neg Hx   . Hypotension Neg Hx   . Malignant hyperthermia Neg Hx   . Pseudochol  deficiency Neg Hx   . Colon cancer Neg Hx     BP 121/70   Pulse 70   Ht 4\' 11"  (1.499 m)   Wt 135 lb (61.2 kg)   BMI 27.27 kg/m   Body mass index is 27.27 kg/m.      Objective:   Physical Exam  Constitutional: She is oriented to person, place, and time. She appears well-developed and well-nourished.  HENT:  Head: Normocephalic and atraumatic.  Eyes: Pupils are equal, round, and reactive to light. Conjunctivae and EOM are normal.  Neck: Normal range of motion. Neck supple.  Cardiovascular: Normal rate, regular rhythm and intact distal pulses.  Pulmonary/Chest: Effort normal.  Abdominal: Soft.  Musculoskeletal:       Hands: Neurological: She is alert and oriented to person, place, and time. She has normal reflexes. She displays normal reflexes. No cranial nerve deficit. She exhibits normal muscle tone. Coordination normal.  Skin: Skin is warm and dry.  Psychiatric: She has a normal mood and affect. Her behavior is normal. Judgment and thought content normal.     X-rays were done of the right long finger,reported separately.     Assessment & Plan:   Encounter Diagnoses  Name Primary?  . Pain of finger of right hand Yes  . Trigger finger of right thumb   . Closed nondisplaced fracture of distal phalanx of right middle finger, initial encounter    Procedure note: After permission from the patient the right dominant thumb was prepped volarly.  I injected 1 % Xylocaine plain and 1 cc DepoMedrol 40 by sterile technique tolerated well at the A1 Pulley area of the right thumb.  I have explained she may need surgery for  the trigger thumb.  A splint was made for the long finger and applied.  Return in two weeks. Call if any problem.  Precautions discussed.   Electronically Signed Sanjuana Kava, MD 10/16/20192:58 PM

## 2018-01-13 ENCOUNTER — Ambulatory Visit (INDEPENDENT_AMBULATORY_CARE_PROVIDER_SITE_OTHER): Payer: Medicare Other | Admitting: Orthopaedic Surgery

## 2018-01-13 ENCOUNTER — Ambulatory Visit (INDEPENDENT_AMBULATORY_CARE_PROVIDER_SITE_OTHER): Payer: Medicare Other

## 2018-01-13 ENCOUNTER — Encounter: Payer: Self-pay | Admitting: Orthopaedic Surgery

## 2018-01-13 VITALS — BP 124/70 | HR 63 | Ht 59.0 in | Wt 134.0 lb

## 2018-01-13 DIAGNOSIS — M65311 Trigger thumb, right thumb: Secondary | ICD-10-CM

## 2018-01-13 DIAGNOSIS — S62662D Nondisplaced fracture of distal phalanx of right middle finger, subsequent encounter for fracture with routine healing: Secondary | ICD-10-CM

## 2018-01-13 DIAGNOSIS — M79641 Pain in right hand: Secondary | ICD-10-CM | POA: Diagnosis not present

## 2018-01-13 NOTE — Progress Notes (Signed)
Patient Leah Lynch Leah Lynch, female DOB:02/24/1946, 73 y.o. TOI:[id number]  Chief Complaint  Patient presents with  . Hand Pain    right trigger thumb    HPI  Leah Lynch is a 72 y.o. female who has triggering of the right thumb despite the injection last time.  I will have her see Dr. Aline Brochure for outpatient surgery for this.  Her right long finger is doing well.  X-rays were done and a new splint given for the mallet finger fracture.   Body mass index is 27.06 kg/m.  ROS  Review of Systems  Constitutional: Positive for activity change.  Musculoskeletal: Positive for arthralgias and joint swelling.  All other systems reviewed and are negative.   All other systems reviewed and are negative.  The following is a summary of the past history medically, past history surgically, known current medicines, social history and family history.  This information is gathered electronically by the computer from prior information and documentation.  I review this each visit and have found including this information at this point in the chart is beneficial and informative.    Past Medical History:  Diagnosis Date  . Fibrocystic breast   . GERD (gastroesophageal reflux disease)   . Hyperlipidemia   . Hypertension   . Osteoarthritis   . Osteoporosis   . Shortness of breath dyspnea     Past Surgical History:  Procedure Laterality Date  . ABDOMINAL HYSTERECTOMY    . BREAST SURGERY     removal of cyst from right breast-fibrocystsic  . BUNIONECTOMY  12/19/2010   Procedure: Lillard Anes;  Surgeon: Marcheta Grammes;  Location: [facility];  Service: Orthopedics;  Laterality: Right;  [employer], [name] Bunionectomy  . BUNIONECTOMY  05/22/2011   Procedure: Lillard Anes;  Surgeon: Marcheta Grammes, DPM;  Location: [facility];  Service: Orthopedics;  Laterality: Left;  [name] Bunionectomy Left Foot  . CAPSULOTOMY  01/29/2012   Procedure: CAPSULOTOMY;  Surgeon: Marcheta Grammes, DPM;  Location: [facility];  Service: Orthopedics;  Laterality: Left;  . CATARACT EXTRACTION W/PHACO Right 08/09/2015   Procedure: CATARACT EXTRACTION PHACO AND INTRAOCULAR LENS PLACEMENT RIGHT EYE CDE=7.95;  Surgeon: Tonny Branch, MD;  Location: [facility];  Service: Ophthalmology;  Laterality: Right;  . CATARACT EXTRACTION W/PHACO Left 08/27/2015   Procedure: CATARACT EXTRACTION PHACO AND INTRAOCULAR LENS PLACEMENT LEFT EYE; CDE:  6.12;  Surgeon: Tonny Branch, MD;  Location: [facility];  Service: Ophthalmology;  Laterality: Left;  . COLONOSCOPY  2012   Dr. Gala Romney: internal hemorrhoids, hyperplastic polyps. Surveillance 2022  . DIAGNOSTIC LAPAROSCOPY    . FOOT GANGLION EXCISION     right foot  . METATARSAL OSTEOTOMY  05/22/2011   Procedure: METATARSAL OSTEOTOMY;  Surgeon: Marcheta Grammes, DPM;  Location: [facility];  Service: Orthopedics;  Laterality: Left;  [name] Osteotomy Left Foot  . TONSILLECTOMY  age 62  . TUBAL LIGATION      Family History  Problem Relation Age of Onset  . Anesthesia problems Neg Hx   . Hypotension Neg Hx   . Malignant hyperthermia Neg Hx   . Pseudochol deficiency Neg Hx   . Colon cancer Neg Hx     Social History Social History   Tobacco Use  . Smoking status: Current Every Day Smoker    Packs/day: 0.50    Years: 25.00    Pack years: 12.50    Types: Cigarettes  . Smokeless tobacco: Never Used  Substance Use Topics  . Alcohol use: No  . Drug use:  No    Allergies  Allergen Reactions  . Bee Venom Swelling  . Penicillins Hives and Itching    Has patient had a PCN reaction causing immediate rash, facial/tongue/throat swelling, SOB or lightheadedness with hypotension: Yes Has patient had a PCN reaction causing severe rash involving mucus membranes or skin necrosis: No Has patient had a PCN reaction that required hospitalization No Has patient had a PCN reaction occurring within the last 10 years: YES If all of the above answers are "NO", then may proceed with  Cephalosporin use.   . Sulfa Antibiotics Itching    Current Outpatient Medications  Medication Sig Dispense Refill  . alendronate (FOSAMAX) 70 MG tablet every 7 (seven) days.    [name] atenolol-chlorthalidone (TENORETIC) 50-25 MG per tablet Take 0.5 tablets by mouth daily.     [name] atorvastatin (LIPITOR) 40 MG tablet Take 1 tablet by mouth daily.    . B Complex-C (SUPER B COMPLEX PO) Take 1 tablet by mouth daily.     . celecoxib (CELEBREX) 200 MG capsule Take 1 capsule by mouth daily.    . Cholecalciferol (VITAMIN D-3 PO) Take 1 tablet by mouth daily.    . Chromium 1000 MCG TABS Take by mouth daily.    [name] HYDROcodone-acetaminophen (NORCO) 10-325 MG per tablet Take 1 tablet by mouth 4 (four) times daily. Sometimes takes 2/day    . loperamide (IMODIUM) 2 MG capsule Take 2 mg by mouth as needed for diarrhea or loose stools.    [name] LORazepam (ATIVAN) 1 MG tablet Take 0.5-1 tablets by mouth as needed.     [name] Lysine HCl 500 MG TABS Take 1-2 tablets by mouth at bedtime as needed.    . naproxen (NAPROSYN) 500 MG tablet Take 1 tablet (500 mg total) by mouth 2 (two) times daily with a meal. Take with food (Patient taking differently: Take 500 mg by mouth as needed. Take with food) 14 tablet 0  . omeprazole (PRILOSEC) 20 MG capsule Take 20 mg by mouth daily.    . potassium chloride (KLOR-CON) 10 MEQ CR tablet Take 20 mEq by mouth daily.     [name] PROAIR HFA 108 (90 Base) MCG/ACT inhaler Inhale 2 puffs into the lungs every 6 (six) hours as needed for wheezing or shortness of breath.     . VIBERZI 100 MG TABS TAKE 1 TABLET BY MOUTH TWICE DAILY WITH MEALS. 60 tablet 3   No current facility-administered medications for this visit.      Physical Exam  Blood pressure 124/70, pulse 63, height 4\' 11"  (1.499 m), weight 134 lb (60.8 kg).  Constitutional: overall normal hygiene, normal nutrition, well developed, normal grooming, normal body habitus. Assistive device:splint right long finger  Musculoskeletal: gait and  station Limp none, muscle tone and strength are normal, no tremors or atrophy is present.  .  Neurological: coordination overall normal.  Deep tendon reflex/nerve stretch intact.  Sensation normal.  Cranial nerves II-XII intact.   Skin:   Normal overall no scars, lesions, ulcers or rashes. No psoriasis.  Psychiatric: Alert and oriented x 3.  Recent memory intact, remote memory unclear.  Normal mood and affect. Well groomed.  Good eye contact.  Cardiovascular: overall no swelling, no varicosities, no edema bilaterally, normal temperatures of the legs and arms, no clubbing, cyanosis and good capillary refill.  Lymphatic: palpation is normal.  She has triggering and locking of the right thumb IP joint.  NV intact.  The right long finger is doing well.  She is  using the splint.  A new splint applied.  All other systems reviewed and are negative   The patient has been educated about the nature of the problem(s) and counseled on treatment options.  The patient appeared to understand what I have discussed and is in agreement with it.  Encounter Diagnoses  Name Primary?  . Hand pain, right Yes  . Trigger finger of right thumb   . Closed nondisplaced fracture of distal phalanx of right middle finger with routine healing, subsequent encounter     PLAN Call if any problems.  Precautions discussed.  Continue current medications.   Return to clinic to see Dr. Aline Brochure for thumb surgery as outpatient.  X-rays were done of the right long finger, reported separately.  Electronically Signed Sanjuana Kava, MD 10/30/20193:42 PM

## 2018-01-21 NOTE — Patient Instructions (Signed)
Leah Lynch  01/21/2018     @PREFPERIOPPHARMACY @   Your procedure is scheduled on  01/28/2018 .  Report to [facility] at  1120  A.M.  Call this number if you have problems the morning of surgery:  [phone]   Remember:  Do not eat or drink after midnight.                         Take these medicines the morning of surgery with A SIP OF WATER  Atenolol, celebrex, hydrocodone ( if needed), ativan ( if needed), prilosec.    Do not wear jewelry, make-up or nail polish.  Do not wear lotions, powders, or perfumes, or deodorant.  Do not shave 48 hours prior to surgery.  Men may shave face and neck.  Do not bring valuables to the hospital.  [facility] is not responsible for any belongings or valuables.  Contacts, dentures or bridgework may not be worn into surgery.  Leave your suitcase in the car.  After surgery it may be brought to your room.  For patients admitted to the hospital, discharge time will be determined by your treatment team.  Patients discharged the day of surgery will not be allowed to drive home.   Name and phone number of your driver:   family Special instructions:  None  Please read over the following fact sheets that you were given. Anesthesia Post-op Instructions and Care and Recovery After Surgery       Trigger Finger Trigger finger (stenosing tenosynovitis) is a condition that causes a finger to get stuck in a bent position. Each finger has a tough, cord-like tissue that connects muscle to bone (tendon), and each tendon is surrounded by a tunnel of tissue (tendon sheath). To move your finger, your tendon needs to slide freely through the sheath. Trigger finger happens when the tendon or the sheath thickens, making it difficult to move your finger. Trigger finger can affect any finger or a thumb. It may affect more than one finger. Mild cases may clear up with rest and medicine. Severe cases require more treatment. What are the  causes? Trigger finger is caused by a thickened finger tendon or tendon sheath. The cause of this thickening is not known. What increases the risk? The following factors may make you more likely to develop this condition:  Doing activities that require a strong grip.  Having rheumatoid arthritis, gout, or diabetes.  Being 84-51 years old.  Being a woman.  What are the signs or symptoms? Symptoms of this condition include:  Pain when bending or straightening your finger.  Tenderness or swelling where your finger attaches to the palm of your hand.  A lump in the palm of your hand or on the inside of your finger.  Hearing a popping sound when you try to straighten your finger.  Feeling a popping, catching, or locking sensation when you try to straighten your finger.  Being unable to straighten your finger.  How is this diagnosed? This condition is diagnosed based on your symptoms and a physical exam. How is this treated? This condition may be treated by:  Resting your finger and avoiding activities that make symptoms worse.  Wearing a finger splint to keep your finger in a slightly bent position.  Taking NSAIDs to relieve pain and swelling.  Injecting medicine (steroids) into the tendon sheath to reduce swelling and irritation. Injections may need to be  repeated.  Having surgery to open the tendon sheath. This may be done if other treatments do not work and you cannot straighten your finger. You may need physical therapy after surgery.  Follow these instructions at home:  Use moist heat to help reduce pain and swelling as told by your health care provider.  Rest your finger and avoid activities that make pain worse. Return to normal activities as told by your health care provider.  If you have a splint, wear it as told by your health care provider.  Take over-the-counter and prescription medicines only as told by your health care provider.  Keep all follow-up visits  as told by your health care provider. This is important. Contact a health care provider if:  Your symptoms are not improving with home care. Summary  Trigger finger (stenosing tenosynovitis) causes your finger to get stuck in a bent position, and it can make it difficult and painful to straighten your finger.  This condition develops when a finger tendon or tendon sheath thickens.  Treatment starts with resting, wearing a splint, and taking NSAIDs.  In severe cases, surgery to open the tendon sheath may be needed. This information is not intended to replace advice given to you by your health care provider. Make sure you discuss any questions you have with your health care provider. Document Released: 12/22/2003 Document Revised: 02/12/2016 Document Reviewed: 02/12/2016 Elsevier Interactive Patient Education  2017 [place] Anesthesia is a term that refers to techniques, procedures, and medicines that help a person stay safe and comfortable during a medical procedure. Monitored anesthesia care, or sedation, is one type of anesthesia. Your anesthesia specialist may recommend sedation if you will be having a procedure that does not require you to be unconscious, such as:  Cataract surgery.  A dental procedure.  A biopsy.  A colonoscopy.  During the procedure, you may receive a medicine to help you relax (sedative). There are three levels of sedation:  Mild sedation. At this level, you may feel awake and relaxed. You will be able to follow directions.  Moderate sedation. At this level, you will be sleepy. You may not remember the procedure.  Deep sedation. At this level, you will be asleep. You will not remember the procedure.  The more medicine you are given, the deeper your level of sedation will be. Depending on how you respond to the procedure, the anesthesia specialist may change your level of sedation or the type of anesthesia to fit your needs.  An anesthesia specialist will monitor you closely during the procedure. Let your health care provider know about:  Any allergies you have.  All medicines you are taking, including vitamins, herbs, eye drops, creams, and over-the-counter medicines.  Any use of steroids (by mouth or as a cream).  Any problems you or family members have had with sedatives and anesthetic medicines.  Any blood disorders you have.  Any surgeries you have had.  Any medical conditions you have, such as sleep apnea.  Whether you are pregnant or may be pregnant.  Any use of cigarettes, alcohol, or street drugs. What are the risks? Generally, this is a safe procedure. However, problems may occur, including:  Getting too much medicine (oversedation).  Nausea.  Allergic reaction to medicines.  Trouble breathing. If this happens, a breathing tube may be used to help with breathing. It will be removed when you are awake and breathing on your own.  Heart trouble.  Lung trouble.  Before  the procedure Staying hydrated Follow instructions from your health care provider about hydration, which may include:  Up to 2 hours before the procedure - you may continue to drink clear liquids, such as water, clear fruit juice, black coffee, and plain tea.  Eating and drinking restrictions Follow instructions from your health care provider about eating and drinking, which may include:  8 hours before the procedure - stop eating heavy meals or foods such as meat, fried foods, or fatty foods.  6 hours before the procedure - stop eating light meals or foods, such as toast or cereal.  6 hours before the procedure - stop drinking milk or drinks that contain milk.  2 hours before the procedure - stop drinking clear liquids.  Medicines Ask your health care provider about:  Changing or stopping your regular medicines. This is especially important if you are taking diabetes medicines or blood thinners.  Taking  medicines such as aspirin and ibuprofen. These medicines can thin your blood. Do not take these medicines before your procedure if your health care provider instructs you not to.  Tests and exams  You will have a physical exam.  You may have blood tests done to show: ? How well your kidneys and liver are working. ? How well your blood can clot.  General instructions  Plan to have someone take you home from the hospital or clinic.  If you will be going home right after the procedure, plan to have someone with you for 24 hours.  What happens during the procedure?  Your blood pressure, heart rate, breathing, level of pain and overall condition will be monitored.  An IV tube will be inserted into one of your veins.  Your anesthesia specialist will give you medicines as needed to keep you comfortable during the procedure. This may mean changing the level of sedation.  The procedure will be performed. After the procedure  Your blood pressure, heart rate, breathing rate, and blood oxygen level will be monitored until the medicines you were given have worn off.  Do not drive for 24 hours if you received a sedative.  You may: ? Feel sleepy, clumsy, or nauseous. ? Feel forgetful about what happened after the procedure. ? Have a sore throat if you had a breathing tube during the procedure. ? Vomit. This information is not intended to replace advice given to you by your health care provider. Make sure you discuss any questions you have with your health care provider. Document Released: 11/27/2004 Document Revised: 08/10/2015 Document Reviewed: 06/24/2015 Elsevier Interactive Patient Education  2018 [place], Care After These instructions provide you with information about caring for yourself after your procedure. Your health care provider may also give you more specific instructions. Your treatment has been planned according to current medical practices,  but problems sometimes occur. Call your health care provider if you have any problems or questions after your procedure. What can I expect after the procedure? After your procedure, it is common to:  Feel sleepy for several hours.  Feel clumsy and have poor balance for several hours.  Feel forgetful about what happened after the procedure.  Have poor judgment for several hours.  Feel nauseous or vomit.  Have a sore throat if you had a breathing tube during the procedure.  Follow these instructions at home: For at least 24 hours after the procedure:   Do not: ? Participate in activities in which you could fall or become injured. ? Drive. ?  Use heavy machinery. ? Drink alcohol. ? Take sleeping pills or medicines that cause drowsiness. ? Make important decisions or sign legal documents. ? Take care of children on your own.  Rest. Eating and drinking  Follow the diet that is recommended by your health care provider.  If you vomit, drink water, juice, or soup when you can drink without vomiting.  Make sure you have little or no nausea before eating solid foods. General instructions  Have a responsible adult stay with you until you are awake and alert.  Take over-the-counter and prescription medicines only as told by your health care provider.  If you smoke, do not smoke without supervision.  Keep all follow-up visits as told by your health care provider. This is important. Contact a health care provider if:  You keep feeling nauseous or you keep vomiting.  You feel light-headed.  You develop a rash.  You have a fever. Get help right away if:  You have trouble breathing. This information is not intended to replace advice given to you by your health care provider. Make sure you discuss any questions you have with your health care provider. Document Released: 06/24/2015 Document Revised: 10/24/2015 Document Reviewed: 06/24/2015 Elsevier Interactive Patient  Education  [employer].

## 2018-01-26 ENCOUNTER — Encounter (HOSPITAL_COMMUNITY)
Admission: RE | Admit: 2018-01-26 | Discharge: 2018-01-26 | Disposition: A | Discharge status: Home / Self Care | Payer: Medicare Other | Source: Ambulatory Visit | Attending: Orthopedic Surgery | Admitting: Orthopedic Surgery

## 2018-01-26 ENCOUNTER — Other Ambulatory Visit: Payer: Self-pay

## 2018-01-26 DIAGNOSIS — M65841 Other synovitis and tenosynovitis, right hand: Secondary | ICD-10-CM | POA: Diagnosis not present

## 2018-01-26 DIAGNOSIS — M65311 Trigger thumb, right thumb: Secondary | ICD-10-CM

## 2018-01-26 DIAGNOSIS — Z791 Long term (current) use of non-steroidal anti-inflammatories (NSAID): Secondary | ICD-10-CM | POA: Diagnosis not present

## 2018-01-26 DIAGNOSIS — K219 Gastro-esophageal reflux disease without esophagitis: Secondary | ICD-10-CM | POA: Diagnosis not present

## 2018-01-26 DIAGNOSIS — Z79899 Other long term (current) drug therapy: Secondary | ICD-10-CM | POA: Diagnosis not present

## 2018-01-26 DIAGNOSIS — E785 Hyperlipidemia, unspecified: Secondary | ICD-10-CM | POA: Diagnosis not present

## 2018-01-26 DIAGNOSIS — I10 Essential (primary) hypertension: Secondary | ICD-10-CM | POA: Diagnosis not present

## 2018-01-26 DIAGNOSIS — Z01818 Encounter for other preprocedural examination: Secondary | ICD-10-CM

## 2018-01-26 DIAGNOSIS — F17210 Nicotine dependence, cigarettes, uncomplicated: Secondary | ICD-10-CM | POA: Diagnosis not present

## 2018-01-26 DIAGNOSIS — M810 Age-related osteoporosis without current pathological fracture: Secondary | ICD-10-CM | POA: Diagnosis not present

## 2018-01-26 LAB — CBC WITH DIFFERENTIAL/PLATELET
ABS IMMATURE GRANULOCYTES: 0.02 10*3/uL (ref 0.00–0.07)
Basophils Absolute: 0 10*3/uL (ref 0.0–0.1)
Basophils Relative: 0 %
Eosinophils Absolute: 0.3 10*3/uL (ref 0.0–0.5)
Eosinophils Relative: 3 %
HEMATOCRIT: 43.7 % (ref 36.0–46.0)
HEMOGLOBIN: 13.7 g/dL (ref 12.0–15.0)
IMMATURE GRANULOCYTES: 0 %
LYMPHS ABS: 5.2 10*3/uL — AB (ref 0.7–4.0)
Lymphocytes Relative: 45 %
MCH: 28.7 pg (ref 26.0–34.0)
MCHC: 31.4 g/dL (ref 30.0–36.0)
MCV: 91.4 fL (ref 80.0–100.0)
MONOS PCT: 5 %
Monocytes Absolute: 0.6 10*3/uL (ref 0.1–1.0)
NEUTROS ABS: 5.5 10*3/uL (ref 1.7–7.7)
NEUTROS PCT: 47 %
Platelets: 305 10*3/uL (ref 150–400)
RBC: 4.78 MIL/uL (ref 3.87–5.11)
RDW: 16.6 % — ABNORMAL HIGH (ref 11.5–15.5)
WBC: 11.6 10*3/uL — ABNORMAL HIGH (ref 4.0–10.5)
nRBC: 0 % (ref 0.0–0.2)

## 2018-01-26 LAB — BASIC METABOLIC PANEL
ANION GAP: 7 (ref 5–15)
BUN: 20 mg/dL (ref 8–23)
CALCIUM: 8.8 mg/dL — AB (ref 8.9–10.3)
CO2: 26 mmol/L (ref 22–32)
Chloride: 105 mmol/L (ref 98–111)
Creatinine, Ser: 0.64 mg/dL (ref 0.44–1.00)
GFR calc Af Amer: >60 mL/min (ref >60)
GFR calc non Af Amer: >60 mL/min (ref >60)
GLUCOSE: 107 mg/dL — AB (ref 70–99)
Potassium: 3.4 mmol/L — ABNORMAL LOW (ref 3.5–5.1)
Sodium: 138 mmol/L (ref 135–145)

## 2018-01-26 NOTE — H&P (Signed)
Chief Complaint  Patient presents with  . Hand Pain      right trigger thumb      HPI  72 year old female sent to me with triggering of the right thumb failed injection therapy complains of pain over the thumb A1 pulley which is dull occasionally stabbing moderate in severity for greater than 6 weeks duration associated with clicking popping   Body mass index is 27.06 kg/m.   ROS   Review of Systems  Constitutional: Positive for activity change.  Musculoskeletal: Positive for arthralgias and joint swelling.  All other systems reviewed and are negative.     All other systems reviewed and are negative.        Past Medical History:  Diagnosis Date  . Fibrocystic breast    . GERD (gastroesophageal reflux disease)    . Hyperlipidemia    . Hypertension    . Osteoarthritis    . Osteoporosis    . Shortness of breath dyspnea             Past Surgical History:  Procedure Laterality Date  . ABDOMINAL HYSTERECTOMY      . BREAST SURGERY        removal of cyst from right breast-fibrocystsic  . BUNIONECTOMY   12/19/2010    Procedure: Lillard Anes;  Surgeon: Marcheta Grammes;  Location: [facility];  Service: Orthopedics;  Laterality: Right;  [employer], [name] Bunionectomy  . BUNIONECTOMY   05/22/2011    Procedure: Lillard Anes;  Surgeon: Marcheta Grammes, DPM;  Location: [facility];  Service: Orthopedics;  Laterality: Left;  [name] Bunionectomy Left Foot  . CAPSULOTOMY   01/29/2012    Procedure: CAPSULOTOMY;  Surgeon: Marcheta Grammes, DPM;  Location: [facility];  Service: Orthopedics;  Laterality: Left;  . CATARACT EXTRACTION W/PHACO Right 08/09/2015    Procedure: CATARACT EXTRACTION PHACO AND INTRAOCULAR LENS PLACEMENT RIGHT EYE CDE=7.95;  Surgeon: Tonny Branch, MD;  Location: [facility];  Service: Ophthalmology;  Laterality: Right;  . CATARACT EXTRACTION W/PHACO Left 08/27/2015    Procedure: CATARACT EXTRACTION PHACO AND INTRAOCULAR LENS PLACEMENT LEFT EYE; CDE:  6.12;   Surgeon: Tonny Branch, MD;  Location: [facility];  Service: Ophthalmology;  Laterality: Left;  . COLONOSCOPY   2012    Dr. Gala Romney: internal hemorrhoids, hyperplastic polyps. Surveillance 2022  . DIAGNOSTIC LAPAROSCOPY      . FOOT GANGLION EXCISION        right foot  . METATARSAL OSTEOTOMY   05/22/2011    Procedure: METATARSAL OSTEOTOMY;  Surgeon: Marcheta Grammes, DPM;  Location: [facility];  Service: Orthopedics;  Laterality: Left;  [name] Osteotomy Left Foot  . TONSILLECTOMY   age 52  . TUBAL LIGATION               Family History  Problem Relation Age of Onset  . Anesthesia problems Neg Hx    . Hypotension Neg Hx    . Malignant hyperthermia Neg Hx    . Pseudochol deficiency Neg Hx    . Colon cancer Neg Hx        Social History Social History         Tobacco Use  . Smoking status: Current Every Day Smoker      Packs/day: 0.50      Years: 25.00      Pack years: 12.50      Types: Cigarettes  . Smokeless tobacco: Never Used  Substance Use Topics  . Alcohol use: No  . Drug use: No  Allergies  Allergen Reactions  . Bee Venom Swelling  . Penicillins Hives and Itching      Has patient had a PCN reaction causing immediate rash, facial/tongue/throat swelling, SOB or lightheadedness with hypotension: Yes Has patient had a PCN reaction causing severe rash involving mucus membranes or skin necrosis: No Has patient had a PCN reaction that required hospitalization No Has patient had a PCN reaction occurring within the last 10 years: YES If all of the above answers are "NO", then may proceed with Cephalosporin use.    . Sulfa Antibiotics Itching            Current Outpatient Medications  Medication Sig Dispense Refill  . alendronate (FOSAMAX) 70 MG tablet every 7 (seven) days.      [name] atenolol-chlorthalidone (TENORETIC) 50-25 MG per tablet Take 0.5 tablets by mouth daily.       [name] atorvastatin (LIPITOR) 40 MG tablet Take 1 tablet by mouth daily.      . B Complex-C (SUPER B  COMPLEX PO) Take 1 tablet by mouth daily.       . celecoxib (CELEBREX) 200 MG capsule Take 1 capsule by mouth daily.      . Cholecalciferol (VITAMIN D-3 PO) Take 1 tablet by mouth daily.      . Chromium 1000 MCG TABS Take by mouth daily.      [name] HYDROcodone-acetaminophen (NORCO) 10-325 MG per tablet Take 1 tablet by mouth 4 (four) times daily. Sometimes takes 2/day      . loperamide (IMODIUM) 2 MG capsule Take 2 mg by mouth as needed for diarrhea or loose stools.      [name] LORazepam (ATIVAN) 1 MG tablet Take 0.5-1 tablets by mouth as needed.       [name] Lysine HCl 500 MG TABS Take 1-2 tablets by mouth at bedtime as needed.      . naproxen (NAPROSYN) 500 MG tablet Take 1 tablet (500 mg total) by mouth 2 (two) times daily with a meal. Take with food (Patient taking differently: Take 500 mg by mouth as needed. Take with food) 14 tablet 0  . omeprazole (PRILOSEC) 20 MG capsule Take 20 mg by mouth daily.      . potassium chloride (KLOR-CON) 10 MEQ CR tablet Take 20 mEq by mouth daily.       [name] PROAIR HFA 108 (90 Base) MCG/ACT inhaler Inhale 2 puffs into the lungs every 6 (six) hours as needed for wheezing or shortness of breath.       . VIBERZI 100 MG TABS TAKE 1 TABLET BY MOUTH TWICE DAILY WITH MEALS. 60 tablet 3    No current facility-administered medications for this visit.        Physical Exam  Constitutional: She is oriented to person, place, and time. She appears well-developed and well-nourished.  Neck: Normal range of motion.  Cardiovascular: Normal rate and intact distal pulses.  Musculoskeletal: Normal range of motion. She exhibits no edema or deformity.       Arms: Neurological: She is alert and oriented to person, place, and time. She displays normal reflexes. No cranial nerve deficit or sensory deficit. She exhibits normal muscle tone. Coordination normal.  Skin: Skin is warm and dry. Capillary refill takes less than 2 seconds. No rash noted. No erythema. No pallor.  Psychiatric: She has a  normal mood and affect. Her behavior is normal. Judgment and thought content normal.  Vitals reviewed.   Triggering right thumb     PLAN Release trigger thumb  right hand  The procedure has been fully reviewed with the patient; The risks and benefits of surgery have been discussed and explained and understood. Alternative treatment has also been reviewed, questions were encouraged and answered. The postoperative plan is also been reviewed.

## 2018-01-28 ENCOUNTER — Encounter (HOSPITAL_COMMUNITY)
Admission: RE | Disposition: A | Discharge status: Home / Self Care | Payer: Self-pay | Source: Ambulatory Visit | Attending: Orthopedic Surgery

## 2018-01-28 ENCOUNTER — Ambulatory Visit (HOSPITAL_COMMUNITY): Payer: Medicare Other | Admitting: Anesthesiology

## 2018-01-28 ENCOUNTER — Encounter (HOSPITAL_COMMUNITY): Payer: Self-pay | Admitting: *Deleted

## 2018-01-28 ENCOUNTER — Other Ambulatory Visit: Payer: Self-pay

## 2018-01-28 ENCOUNTER — Ambulatory Visit (HOSPITAL_COMMUNITY)
Admission: RE | Admit: 2018-01-28 | Discharge: 2018-01-28 | Disposition: A | Discharge status: Home / Self Care | Payer: Medicare Other | Source: Ambulatory Visit | Attending: Orthopedic Surgery | Admitting: Orthopedic Surgery

## 2018-01-28 DIAGNOSIS — I10 Essential (primary) hypertension: Secondary | ICD-10-CM | POA: Insufficient documentation

## 2018-01-28 DIAGNOSIS — F17210 Nicotine dependence, cigarettes, uncomplicated: Secondary | ICD-10-CM | POA: Insufficient documentation

## 2018-01-28 DIAGNOSIS — Z79899 Other long term (current) drug therapy: Secondary | ICD-10-CM | POA: Insufficient documentation

## 2018-01-28 DIAGNOSIS — M810 Age-related osteoporosis without current pathological fracture: Secondary | ICD-10-CM | POA: Insufficient documentation

## 2018-01-28 DIAGNOSIS — K219 Gastro-esophageal reflux disease without esophagitis: Secondary | ICD-10-CM | POA: Insufficient documentation

## 2018-01-28 DIAGNOSIS — E785 Hyperlipidemia, unspecified: Secondary | ICD-10-CM | POA: Insufficient documentation

## 2018-01-28 DIAGNOSIS — Z791 Long term (current) use of non-steroidal anti-inflammatories (NSAID): Secondary | ICD-10-CM | POA: Insufficient documentation

## 2018-01-28 DIAGNOSIS — M65311 Trigger thumb, right thumb: Secondary | ICD-10-CM | POA: Insufficient documentation

## 2018-01-28 DIAGNOSIS — M65841 Other synovitis and tenosynovitis, right hand: Secondary | ICD-10-CM | POA: Insufficient documentation

## 2018-01-28 HISTORY — PX: TRIGGER FINGER RELEASE: SHX641

## 2018-01-28 SURGERY — RELEASE, A1 PULLEY, FOR TRIGGER FINGER
Anesthesia: Regional | Laterality: Right

## 2018-01-28 MED ORDER — PROPOFOL 500 MG/50ML IV EMUL
INTRAVENOUS | Status: DC | PRN
  Administered 2018-01-28: 50 ug/kg/min via INTRAVENOUS

## 2018-01-28 MED ORDER — LACTATED RINGERS IV SOLN: INTRAVENOUS | Status: DC

## 2018-01-28 MED ORDER — MIDAZOLAM HCL 5 MG/5ML IJ SOLN
INTRAMUSCULAR | Status: DC | PRN
  Administered 2018-01-28: 1 mg via INTRAVENOUS

## 2018-01-28 MED ORDER — TRAMADOL-ACETAMINOPHEN 37.5-325 MG PO TABS: 1.0000 | ORAL_TABLET | Freq: Four times a day (QID) | ORAL | 0 refills | Status: DC | PRN

## 2018-01-28 MED ORDER — BUPIVACAINE HCL (PF) 0.5 % IJ SOLN
INTRAMUSCULAR | Status: DC | PRN
  Administered 2018-01-28: 10 mL

## 2018-01-28 MED ORDER — LIDOCAINE HCL (PF) 0.5 % IJ SOLN
INTRAMUSCULAR | Status: AC
  Filled 2018-01-28: qty 50

## 2018-01-28 MED ORDER — FENTANYL CITRATE (PF) 100 MCG/2ML IJ SOLN
INTRAMUSCULAR | Status: AC
  Filled 2018-01-28: qty 2

## 2018-01-28 MED ORDER — CHLORHEXIDINE GLUCONATE 4 % EX LIQD: 60.0000 mL | Freq: Once | CUTANEOUS | Status: DC

## 2018-01-28 MED ORDER — SODIUM CHLORIDE 0.9% FLUSH
INTRAVENOUS | Status: AC
  Filled 2018-01-28: qty 10

## 2018-01-28 MED ORDER — PROMETHAZINE HCL 25 MG/ML IJ SOLN: 6.2500 mg | INTRAMUSCULAR | Status: DC | PRN

## 2018-01-28 MED ORDER — BUPIVACAINE HCL (PF) 0.5 % IJ SOLN
INTRAMUSCULAR | Status: AC
  Filled 2018-01-28: qty 30

## 2018-01-28 MED ORDER — MEPERIDINE HCL 50 MG/ML IJ SOLN: 6.2500 mg | INTRAMUSCULAR | Status: DC | PRN

## 2018-01-28 MED ORDER — HYDROCODONE-ACETAMINOPHEN 7.5-325 MG PO TABS: 1.0000 | ORAL_TABLET | Freq: Once | ORAL | Status: DC | PRN

## 2018-01-28 MED ORDER — LACTATED RINGERS IV SOLN
INTRAVENOUS | Status: DC
  Administered 2018-01-28: 12:00:00 via INTRAVENOUS

## 2018-01-28 MED ORDER — VANCOMYCIN HCL IN DEXTROSE 1-5 GM/200ML-% IV SOLN
1000.0000 mg | INTRAVENOUS | Status: AC
  Administered 2018-01-28: 1000 mg via INTRAVENOUS
  Filled 2018-01-28: qty 200

## 2018-01-28 MED ORDER — LIDOCAINE HCL (PF) 0.5 % IJ SOLN
INTRAMUSCULAR | Status: DC | PRN
  Administered 2018-01-28: 30 mL via INTRAVENOUS

## 2018-01-28 MED ORDER — HYDROMORPHONE HCL 1 MG/ML IJ SOLN: 0.2500 mg | INTRAMUSCULAR | Status: DC | PRN

## 2018-01-28 MED ORDER — MIDAZOLAM HCL 2 MG/2ML IJ SOLN
INTRAMUSCULAR | Status: AC
  Filled 2018-01-28: qty 2

## 2018-01-28 MED ORDER — SODIUM CHLORIDE 0.9 % IR SOLN
Status: DC | PRN
  Administered 2018-01-28: 1000 mL

## 2018-01-28 MED ORDER — FENTANYL CITRATE (PF) 100 MCG/2ML IJ SOLN
INTRAMUSCULAR | Status: DC | PRN
  Administered 2018-01-28: 25 ug via INTRAVENOUS

## 2018-01-28 MED ORDER — PROPOFOL 10 MG/ML IV BOLUS
INTRAVENOUS | Status: DC | PRN
  Administered 2018-01-28 (×3): 10 mg via INTRAVENOUS

## 2018-01-28 SURGICAL SUPPLY — 39 items
BANDAGE CONFORM 2  STR LF (GAUZE/BANDAGES/DRESSINGS) ×3 IMPLANT
BANDAGE ELASTIC 2 LF NS (GAUZE/BANDAGES/DRESSINGS) ×3 IMPLANT
BANDAGE ESMARK 4X12 BL STRL LF (DISPOSABLE) ×1 IMPLANT
BLADE SURG 15 STRL LF DISP TIS (BLADE) ×1 IMPLANT
BLADE SURG 15 STRL SS (BLADE) ×2
BNDG CONFORM 2 STRL LF (GAUZE/BANDAGES/DRESSINGS) ×3 IMPLANT
BNDG ELASTIC 2 VLCR STRL LF (GAUZE/BANDAGES/DRESSINGS) ×3 IMPLANT
BNDG ESMARK 4X12 BLUE STRL LF (DISPOSABLE) ×3
CHLORAPREP W/TINT 26ML (MISCELLANEOUS) ×3 IMPLANT
CLOTH BEACON ORANGE TIMEOUT ST (SAFETY) ×3 IMPLANT
COVER LIGHT HANDLE STERIS (MISCELLANEOUS) ×6 IMPLANT
CUFF TOURNIQUET SINGLE 18IN (TOURNIQUET CUFF) ×3 IMPLANT
DECANTER SPIKE VIAL GLASS SM (MISCELLANEOUS) ×3 IMPLANT
DRAPE HALF SHEET 40X57 (DRAPES) ×3 IMPLANT
DRSG XEROFORM 1X8 (GAUZE/BANDAGES/DRESSINGS) ×3 IMPLANT
ELECT NEEDLE TIP 2.8 STRL (NEEDLE) ×3 IMPLANT
ELECT REM PT RETURN 9FT ADLT (ELECTROSURGICAL) ×3
ELECTRODE REM PT RTRN 9FT ADLT (ELECTROSURGICAL) ×1 IMPLANT
GAUZE SPONGE 4X4 12PLY STRL (GAUZE/BANDAGES/DRESSINGS) ×3 IMPLANT
GAUZE XEROFORM 1X8 LF (GAUZE/BANDAGES/DRESSINGS) ×3 IMPLANT
GLOVE BIOGEL PI IND STRL 7.0 (GLOVE) ×1 IMPLANT
GLOVE BIOGEL PI INDICATOR 7.0 (GLOVE) ×2
GLOVE SKINSENSE NS SZ8.0 LF (GLOVE) ×2
GLOVE SKINSENSE STRL SZ8.0 LF (GLOVE) ×1 IMPLANT
GLOVE SS N UNI LF 8.5 STRL (GLOVE) ×3 IMPLANT
GOWN STRL REUS W/TWL LRG LVL3 (GOWN DISPOSABLE) ×3 IMPLANT
GOWN STRL REUS W/TWL XL LVL3 (GOWN DISPOSABLE) ×3 IMPLANT
KIT TURNOVER KIT A (KITS) ×3 IMPLANT
MANIFOLD NEPTUNE II (INSTRUMENTS) ×3 IMPLANT
NEEDLE HYPO 21X1.5 SAFETY (NEEDLE) ×3 IMPLANT
NS IRRIG 1000ML POUR BTL (IV SOLUTION) ×3 IMPLANT
PACK BASIC LIMB (CUSTOM PROCEDURE TRAY) ×3 IMPLANT
PAD ARMBOARD 7.5X6 YLW CONV (MISCELLANEOUS) ×3 IMPLANT
POSITIONER HAND ALUMI XLG (MISCELLANEOUS) ×3 IMPLANT
SET BASIN LINEN APH (SET/KITS/TRAYS/PACK) ×3 IMPLANT
SPONGE GAUZE 2X2 8PLY STER LF (GAUZE/BANDAGES/DRESSINGS) ×1
SPONGE GAUZE 2X2 8PLY STRL LF (GAUZE/BANDAGES/DRESSINGS) ×2 IMPLANT
SUT ETHILON 3 0 FSL (SUTURE) ×3 IMPLANT
SYR CONTROL 10ML LL (SYRINGE) ×3 IMPLANT

## 2018-01-28 NOTE — Interval H&P Note (Signed)
History and Physical Interval Note:  01/28/2018 12:44 PM  Leah Lynch  has presented today for surgery, with the diagnosis of right trigger thumb  The various methods of treatment have been discussed with the patient and family. After consideration of risks, benefits and other options for treatment, the patient has consented to  Procedure(s): RELEASE TRIGGER FINGER/A-1 PULLEY right thumb (Right) as a surgical intervention .  The patient's history has been reviewed, patient examined, no change in status, stable for surgery.  I have reviewed the patient's chart and labs.  Questions were answered to the patient's satisfaction.     Arther Abbott

## 2018-01-28 NOTE — Anesthesia Pain Management Evaluation Note (Signed)
Accidentally documented that I wasted Fentany125 mcg on this patient, but actually only wasted 25mcg, Drucie Opitz verified.  Tressie Stalker, CRNA  T. Yates CRNA   01/28/2018 1411 agree and witnessed above

## 2018-01-28 NOTE — Brief Op Note (Signed)
OPERATIVE REPORT   01/28/2018 1:39 PM Arther Abbott, MD   Preop diagnosis trigger finger right thumb Postop diagnosis same  Procedure release A1 pulley right thumb: 26055 Surgeon Aline Brochure Anesthesia Bier block Operation findings: Stenosing tenosynovitis flexor tendon A1 pulley No assistants Counts were correct Clean case no specimen 10 mL of Marcaine with epinephrine injected after the case Patient to recovery room patient's stable condition  The procedure was performed as follows  The patient was identified in the preop area and the surgical site was confirmed and marked, chart update was completed patient taken to surgery given appropriate antibiotics based on her allergy profile  After successful Bier block in sterile prep and drape timeout was completed  A longitudinal incision was made over the A1 pulley of the right thumb, subcutaneous tissue was divided blunt dissection was carried out to protect neurovascular structures. A blunt instrument was placed underneath the A1 pulley and the A1 pulley was released. Flexion extension of the digit confirmed removal of mechanical block. Wound was irrigated and closed with 3-0 nylon suture  We took the patient to recovery room in stable condition  Arther Abbott, MD

## 2018-01-28 NOTE — Anesthesia Procedure Notes (Signed)
Anesthesia Regional Block: Bier block (IV Regional)   Pre-Anesthetic Checklist: ,, timeout performed, Correct Patient, Correct Site, Correct Laterality, Correct Procedure,, site marked, surgical consent,, at surgeon's request  Laterality: Right     Needles:  Injection technique: Single-shot  Needle Type: Other      Needle Gauge: 22     Additional Needles:   Procedures:,,,,, intact distal pulses, Esmarch exsanguination, single tourniquet utilized,   Nerve Stimulator or Paresthesia:   Additional Responses:  Pulse checked post tourniquet inflation. IV NSL discontinued post injection. Narrative:   Performed by: Personally       

## 2018-01-28 NOTE — Discharge Instructions (Signed)
Surgery went very well the triggering phenomenon was released  Your appointment has been changed to November 27        [place], Care After These instructions provide you with information about caring for yourself after your procedure. Your health care provider may also give you more specific instructions. Your treatment has been planned according to current medical practices, but problems sometimes occur. Call your health care provider if you have any problems or questions after your procedure. What can I expect after the procedure? After your procedure, it is common to:  Feel sleepy for several hours.  Feel clumsy and have poor balance for several hours.  Feel forgetful about what happened after the procedure.  Have poor judgment for several hours.  Feel nauseous or vomit.  Have a sore throat if you had a breathing tube during the procedure.  Follow these instructions at home: For at least 24 hours after the procedure:   Do not: ? Participate in activities in which you could fall or become injured. ? Drive. ? Use heavy machinery. ? Drink alcohol. ? Take sleeping pills or medicines that cause drowsiness. ? Make important decisions or sign legal documents. ? Take care of children on your own.  Rest. Eating and drinking  Follow the diet that is recommended by your health care provider.  If you vomit, drink water, juice, or soup when you can drink without vomiting.  Make sure you have little or no nausea before eating solid foods. General instructions  Have a responsible adult stay with you until you are awake and alert.  Take over-the-counter and prescription medicines only as told by your health care provider.  If you smoke, do not smoke without supervision.  Keep all follow-up visits as told by your health care provider. This is important. Contact a health care provider if:  You keep feeling nauseous or you keep vomiting.  You feel  light-headed.  You develop a rash.  You have a fever. Get help right away if:  You have trouble breathing. This information is not intended to replace advice given to you by your health care provider. Make sure you discuss any questions you have with your health care provider. Document Released: 06/24/2015 Document Revised: 10/24/2015 Document Reviewed: 06/24/2015 Elsevier Interactive Patient Education  [employer].

## 2018-01-28 NOTE — Anesthesia Preprocedure Evaluation (Signed)
Anesthesia Evaluation    Airway Mallampati: III       Dental  (+) Partial Lower   Pulmonary shortness of breath, Current Smoker,     + decreased breath sounds      Cardiovascular hypertension, On Medications  Rhythm:regular     Neuro/Psych    GI/Hepatic GERD  Medicated,  Endo/Other    Renal/GU      Musculoskeletal   Abdominal   Peds  Hematology   Anesthesia Other Findings NSR 86  Reproductive/Obstetrics                             Anesthesia Physical Anesthesia Plan  ASA: III  Anesthesia Plan: Bier Block and Bier Block-Lidocaine Only   Post-op Pain Management:    Induction:   PONV Risk Score and Plan:   Airway Management Planned:   Additional Equipment:   Intra-op Plan:   Post-operative Plan:   Informed Consent:   Plan Discussed with: Anesthesiologist  Anesthesia Plan Comments:         Anesthesia Quick Evaluation

## 2018-01-28 NOTE — Anesthesia Postprocedure Evaluation (Signed)
Anesthesia Post Note  Patient: Leah Lynch  Procedure(s) Performed: RIGHT TRIGGER THUMB RELEASE (Right )  Patient location during evaluation: PACU Anesthesia Type: Bier Block Level of consciousness: awake and alert and oriented Pain management: pain level controlled Vital Signs Assessment: post-procedure vital signs reviewed and stable Respiratory status: spontaneous breathing Cardiovascular status: blood pressure returned to baseline and stable Postop Assessment: no apparent nausea or vomiting Anesthetic complications: no     Last Vitals:  Vitals:   01/28/18 1400 01/28/18 1415  BP: 127/69 (!) 143/72  Pulse: 63 63  Resp: 16 17  Temp:    SpO2: 95% 93%    Last Pain:  Vitals:   01/28/18 1414  TempSrc:   PainSc: 0-No pain                 Ilya Ess

## 2018-01-28 NOTE — Transfer of Care (Signed)
Immediate Anesthesia Transfer of Care Note  Patient: Leah Lynch  Procedure(s) Performed: RIGHT TRIGGER THUMB RELEASE (Right )  Patient Location: PACU  Anesthesia Type:Bier block  Level of Consciousness: awake  Airway & Oxygen Therapy: Patient Spontanous Breathing  Post-op Assessment: Report given to RN  Post vital signs: Reviewed  Last Vitals:  Vitals Value Taken Time  BP    Temp    Pulse    Resp    SpO2      Last Pain:  Vitals:   01/28/18 1136  TempSrc: Oral  PainSc: 0-No pain      Patients Stated Pain Goal: 6 (84/12/82 0813)  Complications: No apparent anesthesia complications

## 2018-01-28 NOTE — Op Note (Signed)
OPERATIVE REPORT   01/28/2018 1:39 PM Arther Abbott, MD   Preop diagnosis trigger finger right thumb Postop diagnosis same  Procedure release A1 pulley right thumb Surgeon Aline Brochure Anesthesia Bier block Operation findings: Stenosing tenosynovitis flexor tendon A1 pulley No assistants Counts were correct Clean case no specimen 10 mL of Marcaine with epinephrine injected after the case Patient to recovery room patient's stable condition  The procedure was performed as follows  The patient was identified in the preop area and the surgical site was confirmed and marked, chart update was completed patient taken to surgery given appropriate antibiotics based on her allergy profile  After successful Bier block in sterile prep and drape timeout was completed  A longitudinal incision was made over the A1 pulley of the right thumb, subcutaneous tissue was divided blunt dissection was carried out to protect neurovascular structures. A blunt instrument was placed underneath the A1 pulley and the A1 pulley was released. Flexion extension of the digit confirmed removal of mechanical block. Wound was irrigated and closed with 3-0 nylon suture  We took the patient to recovery room in stable condition  Arther Abbott, MD

## 2018-01-29 ENCOUNTER — Encounter (HOSPITAL_COMMUNITY): Payer: Self-pay | Admitting: Orthopedic Surgery

## 2018-02-05 ENCOUNTER — Ambulatory Visit: Payer: Medicare Other | Admitting: Orthopedic Surgery

## 2018-02-08 DIAGNOSIS — Z9889 Other specified postprocedural states: Secondary | ICD-10-CM | POA: Insufficient documentation

## 2018-02-10 ENCOUNTER — Encounter: Payer: Self-pay | Admitting: Orthopedic Surgery

## 2018-02-10 ENCOUNTER — Ambulatory Visit (INDEPENDENT_AMBULATORY_CARE_PROVIDER_SITE_OTHER): Payer: Medicare Other | Admitting: Orthopedic Surgery

## 2018-02-10 DIAGNOSIS — Z9889 Other specified postprocedural states: Secondary | ICD-10-CM

## 2018-02-10 NOTE — Patient Instructions (Addendum)
ALERT: SEE DR Luna Glasgow FOR MALLET FINGER INJURY IN 4 WEEKS

## 2018-02-10 NOTE — Progress Notes (Signed)
POSTOP VISIT  POD # 13  Chief Complaint  Patient presents with  . Routine Post Op    right thumb trigger release 01/28/18    OPERATIVE REPORT   01/28/2018 1:39 PM Arther Abbott, MD   Preop diagnosis trigger finger right thumb Postop diagnosis same  Procedure release A1 pulley right thumb Surgeon Aline Brochure Anesthesia Bier block Operation findings: Stenosing tenosynovitis flexor tendon A1 pulley    Encounter Diagnosis  Name Primary?  . S/P trigger finger release right thumb 11/141/9     Sutures were removed patient says she has no pain in her triggering phenomenon is resolved  She does have a mallet finger injury that Dr. Luna Glasgow was seeing and she will be followed in 4 weeks for that  We placed skin seal on the right thumb wound and she will follow-up as needed for the triggering   (Work, WB, No orders of the defined types were placed in this encounter. ,FU)

## 2018-02-22 ENCOUNTER — Encounter: Payer: Self-pay | Admitting: Gastroenterology

## 2018-02-22 ENCOUNTER — Other Ambulatory Visit: Payer: Self-pay

## 2018-02-22 ENCOUNTER — Ambulatory Visit (INDEPENDENT_AMBULATORY_CARE_PROVIDER_SITE_OTHER): Payer: Medicare Other | Admitting: Gastroenterology

## 2018-02-22 ENCOUNTER — Telehealth: Payer: Self-pay

## 2018-02-22 VITALS — BP 121/70 | HR 76 | Temp 97.0°F | Ht 59.0 in | Wt 134.8 lb

## 2018-02-22 DIAGNOSIS — R634 Abnormal weight loss: Secondary | ICD-10-CM | POA: Insufficient documentation

## 2018-02-22 DIAGNOSIS — R6881 Early satiety: Secondary | ICD-10-CM | POA: Diagnosis not present

## 2018-02-22 DIAGNOSIS — R194 Change in bowel habit: Secondary | ICD-10-CM

## 2018-02-22 DIAGNOSIS — R197 Diarrhea, unspecified: Secondary | ICD-10-CM

## 2018-02-22 MED ORDER — PEG 3350-KCL-NA BICARB-NACL 420 G PO SOLR: 4000.0000 mL | ORAL | 0 refills | Status: DC

## 2018-02-22 NOTE — Progress Notes (Signed)
CC'ED TO PCP 

## 2018-02-22 NOTE — Progress Notes (Signed)
Referring Provider: Lemmie Evens, MD Primary Care Physician:  Lemmie Evens, MD  Chief Complaint  Patient presents with  . Irritable Bowel Syndrome    HPI:   Leah Lynch is a 72 y.o. female presenting today with a history of IBS. Colonoscopy on file from 2012 and due again in 2022. Has been on Viberzi historically.  Decreased appetite and weight loss noted at last visit. May 2017 was 153. Aug 2018 in the upper 140s. Feb 2019 was 146. Aug 2019 140. Today: 134. Notes early satiety. Decreased appetite. Eating once or twice a day.   Diarrhea: Viberzi 100 mg BID, still with intermittent diarrhea. Dealing with this for over a year. Initially seen by [place] in consultation for diarrhea and changes in bowel habits. Prior to this, would have a BM every day to every other day. No rectal bleeding.      Past Medical History:  Diagnosis Date  . Fibrocystic breast   . GERD (gastroesophageal reflux disease)   . Hyperlipidemia   . Hypertension   . Osteoarthritis   . Osteoporosis   . Shortness of breath dyspnea     Past Surgical History:  Procedure Laterality Date  . ABDOMINAL HYSTERECTOMY    . BREAST SURGERY     removal of cyst from right breast-fibrocystsic  . BUNIONECTOMY  12/19/2010   Procedure: Lillard Anes;  Surgeon: Marcheta Grammes;  Location: [facility];  Service: Orthopedics;  Laterality: Right;  [employer], [name] Bunionectomy  . BUNIONECTOMY  05/22/2011   Procedure: Lillard Anes;  Surgeon: Marcheta Grammes, DPM;  Location: [facility];  Service: Orthopedics;  Laterality: Left;  [name] Bunionectomy Left Foot  . CAPSULOTOMY  01/29/2012   Procedure: CAPSULOTOMY;  Surgeon: Marcheta Grammes, DPM;  Location: [facility];  Service: Orthopedics;  Laterality: Left;  . CATARACT EXTRACTION W/PHACO Right 08/09/2015   Procedure: CATARACT EXTRACTION PHACO AND INTRAOCULAR LENS PLACEMENT RIGHT EYE CDE=7.95;  Surgeon: Tonny Branch, MD;  Location: [facility];  Service:  Ophthalmology;  Laterality: Right;  . CATARACT EXTRACTION W/PHACO Left 08/27/2015   Procedure: CATARACT EXTRACTION PHACO AND INTRAOCULAR LENS PLACEMENT LEFT EYE; CDE:  6.12;  Surgeon: Tonny Branch, MD;  Location: [facility];  Service: Ophthalmology;  Laterality: Left;  . COLONOSCOPY  2012   Dr. Gala Romney: internal hemorrhoids, hyperplastic polyps. Surveillance 2022  . DIAGNOSTIC LAPAROSCOPY    . FOOT GANGLION EXCISION     right foot  . METATARSAL OSTEOTOMY  05/22/2011   Procedure: METATARSAL OSTEOTOMY;  Surgeon: Marcheta Grammes, DPM;  Location: [facility];  Service: Orthopedics;  Laterality: Left;  [name] Osteotomy Left Foot  . TONSILLECTOMY  age 87  . TRIGGER FINGER RELEASE Right 01/28/2018   Procedure: RIGHT TRIGGER THUMB RELEASE;  Surgeon: Carole Civil, MD;  Location: [facility];  Service: Orthopedics;  Laterality: Right;  . TUBAL LIGATION      Current Outpatient Medications  Medication Sig Dispense Refill  . alendronate (FOSAMAX) 70 MG tablet Take 70 mg by mouth every Sunday.     [name] atenolol-chlorthalidone (TENORETIC) 50-25 MG per tablet Take 0.5 tablets by mouth daily.     [name] atorvastatin (LIPITOR) 40 MG tablet Take 40 mg by mouth daily.     . B Complex-C (SUPER B COMPLEX PO) Take 3 tablets by mouth daily.     . celecoxib (CELEBREX) 200 MG capsule Take 200 mg by mouth daily.     . Cholecalciferol (VITAMIN D3) 50 MCG (2000 UT) capsule Take 2,000 Units by mouth daily.    [name]  Chromium 1000 MCG TABS Take 1,000 mcg by mouth daily.     [name] HYDROcodone-acetaminophen (NORCO) 10-325 MG tablet Take 1 tablet by mouth 3 (three) times daily as needed.    [name] omeprazole (PRILOSEC) 20 MG capsule Take 20 mg by mouth daily.    . potassium chloride (KLOR-CON) 10 MEQ CR tablet Take 20 mEq by mouth daily.     [name] PROAIR HFA 108 (90 Base) MCG/ACT inhaler Inhale 2 puffs into the lungs every 6 (six) hours as needed for wheezing or shortness of breath.     . VIBERZI 100 MG TABS TAKE 1 TABLET BY MOUTH TWICE DAILY WITH MEALS.  (Patient taking differently: Take 100 mg by mouth 2 (two) times daily. ) 60 tablet 3   No current facility-administered medications for this visit.     Allergies as of 02/22/2018 - Review Complete 02/22/2018  Allergen Reaction Noted  . Bee venom Swelling 11/11/2010  . Penicillins Hives and Itching 06/27/2010  . Sulfa antibiotics Itching 11/11/2010    Family History  Problem Relation Age of Onset  . Anesthesia problems Neg Hx   . Hypotension Neg Hx   . Malignant hyperthermia Neg Hx   . Pseudochol deficiency Neg Hx   . Colon cancer Neg Hx     Social History   Socioeconomic History  . Marital status: Divorced    Spouse name: Not on file  . Number of children: Not on file  . Years of education: Not on file  . Highest education level: Not on file  Occupational History  . Not on file  Social Needs  . Financial resource strain: Not on file  . Food insecurity:    Worry: Not on file    Inability: Not on file  . Transportation needs:    Medical: Not on file    Non-medical: Not on file  Tobacco Use  . Smoking status: Current Every Day Smoker    Packs/day: 0.50    Years: 25.00    Pack years: 12.50    Types: Cigarettes  . Smokeless tobacco: Never Used  Substance and Sexual Activity  . Alcohol use: No  . Drug use: No  . Sexual activity: Yes    Birth control/protection: Post-menopausal  Lifestyle  . Physical activity:    Days per week: Not on file    Minutes per session: Not on file  . Stress: Not on file  Relationships  . Social connections:    Talks on phone: Not on file    Gets together: Not on file    Attends religious service: Not on file    Active member of club or organization: Not on file    Attends meetings of clubs or organizations: Not on file    Relationship status: Not on file  Other Topics Concern  . Not on file  Social History Narrative  . Not on file    Review of Systems: Gen: Denies fever, chills, anorexia. Denies fatigue, weakness, weight  loss.  CV: Denies chest pain, palpitations, syncope, peripheral edema, and claudication. Resp: Denies dyspnea at rest, cough, wheezing, coughing up blood, and pleurisy. GI: see HPI  Derm: Denies rash, itching, dry skin Psych: Denies depression, anxiety, memory loss, confusion. No homicidal or suicidal ideation.  Heme: Denies bruising, bleeding, and enlarged lymph nodes.  Physical Exam: BP 121/70   Pulse 76   Temp (!) 97 F (36.1 C) (Oral)   Ht 4\' 11"  (1.499 m)   Wt 134 lb 12.8 oz (61.1 kg)  BMI 27.23 kg/m  General:   Alert and oriented. No distress noted. Pleasant and cooperative.  Head:  Normocephalic and atraumatic. Eyes:  Conjuctiva clear without scleral icterus. Mouth:  Oral mucosa pink and moist.  Lungs: clear to auscultation bilaterally Cardiac: S1 S2 present without murmurs  Abdomen:  +BS, soft, non-tender and non-distended. No rebound or guarding. No HSM or masses noted. Msk:  Symmetrical without gross deformities. Normal posture. Extremities:  Without edema. Neurologic:  Alert and  oriented x4 Psych:  Alert and cooperative. Normal mood and affect.

## 2018-02-22 NOTE — Assessment & Plan Note (Signed)
She has lost another 6 lbs over past few months. May 2017 was 153, today 134. 20 lbs weight loss and steady. She will be seeing Dr. Karie Kirks on 02/23/18. May need to pursue imaging if colonoscopy/EGD is unrevealing. I do wonder if she has an element of pancreatic insufficiency, but we need to rule out occult GI etiology.

## 2018-02-22 NOTE — Telephone Encounter (Signed)
Tried to call pt to inform of pre-op appt 04/08/18 at 11:00am, no answer, LMOVM. Letter mailed.

## 2018-02-22 NOTE — Assessment & Plan Note (Signed)
New onset over past few months, with associated notable weight loss. No prior EGD.   Possible EGD at time of colonoscopy. The risks, benefits, and alternatives have been discussed in detail with patient. They have stated understanding and desire to proceed.  Propofol due to polypharmacy May ultimately need imaging

## 2018-02-22 NOTE — Assessment & Plan Note (Signed)
72 year old female with diarrhea for over a year now, with some improvement on Viberzi 100 mg BID. However, she still notes significant episodes at times and persistent weight loss is concerning. Last colonoscopy in 2012. Discussed with her pursuing diagnostic colonoscopy, as she has had a change in bowel habits and associated weight loss.  Proceed with TCS with Dr. Gala Romney in near future: the risks, benefits, and alternatives have been discussed with the patient in detail. The patient states understanding and desires to proceed. Propofol due to polypharmacy

## 2018-02-22 NOTE — Patient Instructions (Signed)
Due to weight loss, persistent diarrhea, and loss of appetite, we are arranging a colonoscopy and upper endoscopy with Dr. Gala Romney.  I am glad you are seeing Dr. Karie Kirks tomorrow. We do not have any recent blood work other than your blood count and kidney/electrolyte function. I anticipate he may order more labs.   We will see you in about 3 months unless something changes!  I enjoyed seeing you again today! As you know, I value our relationship and want to provide genuine, compassionate, and quality care. I welcome your feedback. If you receive a survey regarding your visit,  I greatly appreciate you taking time to fill this out. See you next time!  Annitta Needs, PhD, ANP-BC [facility]

## 2018-03-04 ENCOUNTER — Ambulatory Visit (INDEPENDENT_AMBULATORY_CARE_PROVIDER_SITE_OTHER): Payer: Medicare Other | Admitting: Orthopaedic Surgery

## 2018-03-04 ENCOUNTER — Encounter: Payer: Self-pay | Admitting: Orthopaedic Surgery

## 2018-03-04 VITALS — BP 131/76 | HR 67 | Ht 59.0 in | Wt 136.0 lb

## 2018-03-04 DIAGNOSIS — Z9889 Other specified postprocedural states: Secondary | ICD-10-CM | POA: Diagnosis not present

## 2018-03-04 DIAGNOSIS — M79641 Pain in right hand: Secondary | ICD-10-CM

## 2018-03-04 NOTE — Progress Notes (Signed)
CC:  My hand is OK  She has no pain with the right hand.  Her right trigger thumb is doing well post surgery.  Her mallet finger of the right long finger has healed with no lag and full ROM of the DIP joint.  I will see her as needed.  Call if any problem.  Precautions discussed.   Electronically Signed Sanjuana Kava, MD 12/19/20198:42 AM

## 2018-03-24 ENCOUNTER — Other Ambulatory Visit: Payer: Self-pay | Admitting: Gastroenterology

## 2018-04-01 ENCOUNTER — Telehealth: Payer: Self-pay | Admitting: Internal Medicine

## 2018-04-01 NOTE — Telephone Encounter (Signed)
Pt said that her insurance will not cover Viberzi anymore and is there something else cheaper we could recommend. Please advise and call her at [phone]

## 2018-04-02 NOTE — Telephone Encounter (Signed)
Left a message with pt. Working on [place].

## 2018-04-07 NOTE — Telephone Encounter (Signed)
PA submitted to insurance company.

## 2018-04-08 ENCOUNTER — Ambulatory Visit (HOSPITAL_COMMUNITY)
Admission: RE | Admit: 2018-04-08 | Discharge: 2018-04-08 | Disposition: A | Discharge status: Home / Self Care | Payer: Medicare Other | Source: Ambulatory Visit | Attending: Internal Medicine | Admitting: Internal Medicine

## 2018-04-08 ENCOUNTER — Encounter (HOSPITAL_COMMUNITY): Payer: Self-pay

## 2018-04-14 ENCOUNTER — Telehealth: Payer: Self-pay | Admitting: *Deleted

## 2018-04-14 NOTE — Telephone Encounter (Signed)
Carolyn from endo called wanting to see if patient will move up to 10:45am slot on schedule 04/15/2018. Patient stated she could and is aware to arrive at 8:45am. She will drink 2nd half of prep tomorrow AM at 5:45am and can have clear liquids until 7:45am. Called carolyn in endo and made aware.

## 2018-04-15 ENCOUNTER — Ambulatory Visit (HOSPITAL_COMMUNITY): Payer: Medicare Other | Admitting: Anesthesiology

## 2018-04-15 ENCOUNTER — Encounter (HOSPITAL_COMMUNITY): Payer: Self-pay | Admitting: *Deleted

## 2018-04-15 ENCOUNTER — Encounter (HOSPITAL_COMMUNITY)
Admission: RE | Disposition: A | Discharge status: Home / Self Care | Payer: Self-pay | Source: Home / Self Care | Attending: Internal Medicine

## 2018-04-15 ENCOUNTER — Ambulatory Visit (HOSPITAL_COMMUNITY)
Admission: RE | Admit: 2018-04-15 | Discharge: 2018-04-15 | Disposition: A | Discharge status: Home / Self Care | Payer: Medicare Other | Attending: Internal Medicine | Admitting: Internal Medicine

## 2018-04-15 DIAGNOSIS — K621 Rectal polyp: Secondary | ICD-10-CM | POA: Diagnosis not present

## 2018-04-15 DIAGNOSIS — M810 Age-related osteoporosis without current pathological fracture: Secondary | ICD-10-CM | POA: Insufficient documentation

## 2018-04-15 DIAGNOSIS — R194 Change in bowel habit: Secondary | ICD-10-CM

## 2018-04-15 DIAGNOSIS — K219 Gastro-esophageal reflux disease without esophagitis: Secondary | ICD-10-CM | POA: Insufficient documentation

## 2018-04-15 DIAGNOSIS — K529 Noninfective gastroenteritis and colitis, unspecified: Secondary | ICD-10-CM | POA: Insufficient documentation

## 2018-04-15 DIAGNOSIS — Z79899 Other long term (current) drug therapy: Secondary | ICD-10-CM | POA: Insufficient documentation

## 2018-04-15 DIAGNOSIS — D123 Benign neoplasm of transverse colon: Secondary | ICD-10-CM | POA: Diagnosis not present

## 2018-04-15 DIAGNOSIS — K449 Diaphragmatic hernia without obstruction or gangrene: Secondary | ICD-10-CM | POA: Diagnosis not present

## 2018-04-15 DIAGNOSIS — R634 Abnormal weight loss: Secondary | ICD-10-CM | POA: Insufficient documentation

## 2018-04-15 DIAGNOSIS — E785 Hyperlipidemia, unspecified: Secondary | ICD-10-CM | POA: Insufficient documentation

## 2018-04-15 DIAGNOSIS — F17210 Nicotine dependence, cigarettes, uncomplicated: Secondary | ICD-10-CM | POA: Insufficient documentation

## 2018-04-15 DIAGNOSIS — I10 Essential (primary) hypertension: Secondary | ICD-10-CM | POA: Diagnosis not present

## 2018-04-15 DIAGNOSIS — K635 Polyp of colon: Secondary | ICD-10-CM | POA: Diagnosis not present

## 2018-04-15 DIAGNOSIS — M1990 Unspecified osteoarthritis, unspecified site: Secondary | ICD-10-CM | POA: Diagnosis not present

## 2018-04-15 DIAGNOSIS — R6881 Early satiety: Secondary | ICD-10-CM | POA: Insufficient documentation

## 2018-04-15 HISTORY — PX: ESOPHAGOGASTRODUODENOSCOPY (EGD) WITH PROPOFOL: SHX5813

## 2018-04-15 HISTORY — PX: COLONOSCOPY WITH PROPOFOL: SHX5780

## 2018-04-15 HISTORY — PX: POLYPECTOMY: SHX5525

## 2018-04-15 HISTORY — PX: BIOPSY: SHX5522

## 2018-04-15 SURGERY — COLONOSCOPY WITH PROPOFOL
Anesthesia: Monitor Anesthesia Care

## 2018-04-15 MED ORDER — PROMETHAZINE HCL 25 MG/ML IJ SOLN: 6.2500 mg | INTRAMUSCULAR | Status: DC | PRN

## 2018-04-15 MED ORDER — CHLORHEXIDINE GLUCONATE CLOTH 2 % EX PADS: 6.0000 | MEDICATED_PAD | Freq: Once | CUTANEOUS | Status: DC

## 2018-04-15 MED ORDER — PROPOFOL 10 MG/ML IV BOLUS
INTRAVENOUS | Status: AC
  Filled 2018-04-15: qty 60

## 2018-04-15 MED ORDER — SODIUM CHLORIDE 0.9 % IV SOLN
INTRAVENOUS | Status: DC
  Administered 2018-04-15: 1000 mL via INTRAVENOUS

## 2018-04-15 MED ORDER — KETAMINE HCL 50 MG/5ML IJ SOSY
PREFILLED_SYRINGE | INTRAMUSCULAR | Status: AC
  Filled 2018-04-15: qty 5

## 2018-04-15 MED ORDER — HYDROMORPHONE HCL 1 MG/ML IJ SOLN: 0.2500 mg | INTRAMUSCULAR | Status: DC | PRN

## 2018-04-15 MED ORDER — MEPERIDINE HCL 100 MG/ML IJ SOLN: 6.2500 mg | INTRAMUSCULAR | Status: DC | PRN

## 2018-04-15 MED ORDER — PROPOFOL 500 MG/50ML IV EMUL
INTRAVENOUS | Status: DC | PRN
  Administered 2018-04-15: 60 ug/kg/min via INTRAVENOUS
  Administered 2018-04-15: 150 ug/kg/min via INTRAVENOUS

## 2018-04-15 MED ORDER — PROPOFOL 10 MG/ML IV BOLUS
INTRAVENOUS | Status: DC | PRN
  Administered 2018-04-15: 60 mg via INTRAVENOUS
  Administered 2018-04-15 (×4): 20 mg via INTRAVENOUS

## 2018-04-15 MED ORDER — HYDROCODONE-ACETAMINOPHEN 7.5-325 MG PO TABS: 1.0000 | ORAL_TABLET | Freq: Once | ORAL | Status: DC | PRN

## 2018-04-15 MED ORDER — LACTATED RINGERS IV SOLN: INTRAVENOUS | Status: DC

## 2018-04-15 NOTE — Op Note (Signed)
[facility] Patient Name: Leah Lynch Procedure Date: 04/15/2018 9:53 AM MRN: [id number] Date of Birth: 1945/03/24 Attending MD: Norvel Richards , MD CSN: [id number] Age: 73 Admit Type: Outpatient Procedure:                Upper GI endoscopy Indications:              Early satiety; weight loss -symptoms much improved                            since cessation of Celebrex therapy. Providers:                Norvel Richards, MD, Jeanann Lewandowsky. Sharon Seller, RN,                            Gerome Sam, RN, Aram Candela Referring MD:              Medicines:                Propofol per Anesthesia Complications:            No immediate complications. Estimated Blood Loss:     Estimated blood loss: none. Procedure:                Pre-Anesthesia Assessment:                           - Prior to the procedure, a History and Physical                            was performed, and patient medications and                            allergies were reviewed. The patient's tolerance of                            previous anesthesia was also reviewed. The risks                            and benefits of the procedure and the sedation                            options and risks were discussed with the patient.                            All questions were answered, and informed consent                            was obtained. Prior Anticoagulants: The patient has                            taken no previous anticoagulant or antiplatelet                            agents. ASA Grade Assessment: II - A patient with  mild systemic disease. After reviewing the risks                            and benefits, the patient was deemed in                            satisfactory condition to undergo the procedure.                           After obtaining informed consent, the endoscope was                            passed under direct vision. Throughout the             procedure, the patient's blood pressure, pulse, and                            oxygen saturations were monitored continuously. The                            [id number]-[id number] ([id number]) was introduced through the and                            advanced to the second part of duodenum. Scope In: 10:13:52 AM Scope Out: 10:16:41 AM Total Procedure Duration: 0 hours 2 minutes 49 seconds  Findings:      The examined esophagus was normal.      A small hiatal hernia was present.      The exam was otherwise without abnormality.      The duodenal bulb and second portion of the duodenum were normal. Impression:               - Normal esophagus.                           - Small hiatal hernia.                           - The examination was otherwise normal.                           - No specimens collected. Moderate Sedation:      Moderate (conscious) sedation was personally administered by an       anesthesia professional. The following parameters were monitored: oxygen       saturation, heart rate, blood pressure, respiratory rate, EKG, adequacy       of pulmonary ventilation, and response to care. Recommendation:           - Patient has a contact number available for                            emergencies. The signs and symptoms of potential                            delayed complications were discussed with the  patient. Return to normal activities tomorrow.                            Written discharge instructions were provided to the                            patient.                           - Resume previous diet.                           - Continue present medications. Avoid Celebrex. See                            colonoscopy report. Procedure Code(s):        --- Professional ---                           [phone], Esophagogastroduodenoscopy, flexible,                            transoral; diagnostic, including collection of                             specimen(s) by brushing or washing, when performed                            (separate procedure) Diagnosis Code(s):        --- Professional ---                           K44.9, Diaphragmatic hernia without obstruction or                            gangrene                           R68.81, Early satiety CPT copyright 2018 American Medical Association. All rights reserved. The codes documented in this report are preliminary and upon coder review may  be revised to meet current compliance requirements. [name] Tyrell Seifer, MD Norvel Richards, MD 04/15/2018 10:36:44 AM This report has been signed electronically. Number of Addenda: 0

## 2018-04-15 NOTE — Discharge Instructions (Signed)
°Colonoscopy °Discharge Instructions ° °Read the instructions outlined below and refer to this sheet in the next few weeks. These discharge instructions provide you with general information on caring for yourself after you leave the hospital. Your doctor may also give you specific instructions. While your treatment has been planned according to the most current medical practices available, unavoidable complications occasionally occur. If you have any problems or questions after discharge, call Dr. Rourk at [phone]. °ACTIVITY °· You may resume your regular activity, but move at a slower pace for the next 24 hours.  °· Take frequent rest periods for the next 24 hours.  °· Walking will help get rid of the air and reduce the bloated feeling in your belly (abdomen).  °· No driving for 24 hours (because of the medicine (anesthesia) used during the test).   °· Do not sign any important legal documents or operate any machinery for 24 hours (because of the anesthesia used during the test).  °NUTRITION °· Drink plenty of fluids.  °· You may resume your normal diet as instructed by your doctor.  °· Begin with a light meal and progress to your normal diet. Heavy or fried foods are harder to digest and may make you feel sick to your stomach (nauseated).  °· Avoid alcoholic beverages for 24 hours or as instructed.  °MEDICATIONS °· You may resume your normal medications unless your doctor tells you otherwise.  °WHAT YOU CAN EXPECT TODAY °· Some feelings of bloating in the abdomen.  °· Passage of more gas than usual.  °· Spotting of blood in your stool or on the toilet paper.  °IF YOU HAD POLYPS REMOVED DURING THE COLONOSCOPY: °· No aspirin products for 7 days or as instructed.  °· No alcohol for 7 days or as instructed.  °· Eat a soft diet for the next 24 hours.  °FINDING OUT THE RESULTS OF YOUR TEST °Not all test results are available during your visit. If your test results are not back during the visit, make an appointment  with your caregiver to find out the results. Do not assume everything is normal if you have not heard from your caregiver or the medical facility. It is important for you to follow up on all of your test results.  °SEEK IMMEDIATE MEDICAL ATTENTION IF: °· You have more than a spotting of blood in your stool.  °· Your belly is swollen (abdominal distention).  °· You are nauseated or vomiting.  °· You have a temperature over 101.  °· You have abdominal pain or discomfort that is severe or gets worse throughout the day.  °EGD °Discharge instructions °Please read the instructions outlined below and refer to this sheet in the next few weeks. These discharge instructions provide you with general information on caring for yourself after you leave the hospital. Your doctor may also give you specific instructions. While your treatment has been planned according to the most current medical practices available, unavoidable complications occasionally occur. If you have any problems or questions after discharge, please call your doctor. °ACTIVITY °· You may resume your regular activity but move at a slower pace for the next 24 hours.  °· Take frequent rest periods for the next 24 hours.  °· Walking will help expel (get rid of) the air and reduce the bloated feeling in your abdomen.  °· No driving for 24 hours (because of the anesthesia (medicine) used during the test).  °· You may shower.  °· Do not sign any important   legal documents or operate any machinery for 24 hours (because of the anesthesia used during the test).  NUTRITION  Drink plenty of fluids.   You may resume your normal diet.   Begin with a light meal and progress to your normal diet.   Avoid alcoholic beverages for 24 hours or as instructed by your caregiver.  MEDICATIONS  You may resume your normal medications unless your caregiver tells you otherwise.  WHAT YOU CAN EXPECT TODAY  You may experience abdominal discomfort such as a feeling of fullness  or gas pains.  FOLLOW-UP  Your doctor will discuss the results of your test with you.  SEEK IMMEDIATE MEDICAL ATTENTION IF ANY OF THE FOLLOWING OCCUR:  Excessive nausea (feeling sick to your stomach) and/or vomiting.   Severe abdominal pain and distention (swelling).   Trouble swallowing.   Temperature over 101 F (37.8 C).   Rectal bleeding or vomiting of blood.   Colon polyp information provided  Avoid Celebrex  Further recommendations to follow pending review of pathology report     Colon Polyps  Polyps are tissue growths inside the body. Polyps can grow in many places, including the large intestine (colon). A polyp may be a round bump or a mushroom-shaped growth. You could have one polyp or several. Most colon polyps are noncancerous (benign). However, some colon polyps can become cancerous over time. Finding and removing the polyps early can help prevent this. What are the causes? The exact cause of colon polyps is not known. What increases the risk? You are more likely to develop this condition if you:  Have a family history of colon cancer or colon polyps.  Are older than 35 or older than 45 if you are African American.  Have inflammatory bowel disease, such as ulcerative colitis or Crohn's disease.  Have certain hereditary conditions, such as: ? Familial adenomatous polyposis. ? Lynch syndrome. ? Turcot syndrome. ? Peutz-Jeghers syndrome.  Are overweight.  Smoke cigarettes.  Do not get enough exercise.  Drink too much alcohol.  Eat a diet that is high in fat and red meat and low in fiber.  Had childhood cancer that was treated with abdominal radiation. What are the signs or symptoms? Most polyps do not cause symptoms. If you have symptoms, they may include:  Blood coming from your rectum when having a bowel movement.  Blood in your stool. The stool may look dark red or black.  Abdominal pain.  A change in bowel habits, such as constipation  or diarrhea. How is this diagnosed? This condition is diagnosed with a colonoscopy. This is a procedure in which a lighted, flexible scope is inserted into the anus and then passed into the colon to examine the area. Polyps are sometimes found when a colonoscopy is done as part of routine cancer screening tests. How is this treated? Treatment for this condition involves removing any polyps that are found. Most polyps can be removed during a colonoscopy. Those polyps will then be tested for cancer. Additional treatment may be needed depending on the results of testing. Follow these instructions at home: Lifestyle  Maintain a healthy weight, or lose weight if recommended by your health care provider.  Exercise every day or as told by your health care provider.  Do not use any products that contain nicotine or tobacco, such as cigarettes and e-cigarettes. If you need help quitting, ask your health care provider.  If you drink alcohol, limit how much you have: ? 0-1 drink a day for  women. ? 0-2 drinks a day for men.  Be aware of how much alcohol is in your drink. In the [place] one drink equals one 12 oz bottle of beer (355 mL), one 5 oz glass of wine (148 mL), or one 1 oz shot of hard liquor (44 mL). Eating and drinking   Eat foods that are high in fiber, such as fruits, vegetables, and whole grains.  Eat foods that are high in calcium and vitamin D, such as milk, cheese, yogurt, eggs, liver, fish, and broccoli.  Limit foods that are high in fat, such as fried foods and desserts.  Limit the amount of red meat and processed meat you eat, such as hot dogs, sausage, bacon, and lunch meats. General instructions  Keep all follow-up visits as told by your health care provider. This is important. ? This includes having regularly scheduled colonoscopies. ? Talk to your health care provider about when you need a colonoscopy. Contact a health care provider if:  You have new or worsening  bleeding during a bowel movement.  You have new or increased blood in your stool.  You have a change in bowel habits.  You lose weight for no known reason. Summary  Polyps are tissue growths inside the body. Polyps can grow in many places, including the colon.  Most colon polyps are noncancerous (benign), but some can become cancerous over time.  This condition is diagnosed with a colonoscopy.  Treatment for this condition involves removing any polyps that are found. Most polyps can be removed during a colonoscopy. This information is not intended to replace advice given to you by your health care provider. Make sure you discuss any questions you have with your health care provider. Document Released: 11/28/2003 Document Revised: 06/18/2017 Document Reviewed: 06/18/2017 Elsevier Interactive Patient Education  2019 [place], Care After These instructions provide you with information about caring for yourself after your procedure. Your health care provider may also give you more specific instructions. Your treatment has been planned according to current medical practices, but problems sometimes occur. Call your health care provider if you have any problems or questions after your procedure. What can I expect after the procedure? After your procedure, you may:  Feel sleepy for several hours.  Feel clumsy and have poor balance for several hours.  Feel forgetful about what happened after the procedure.  Have poor judgment for several hours.  Feel nauseous or vomit.  Have a sore throat if you had a breathing tube during the procedure. Follow these instructions at home: For at least 24 hours after the procedure:      Have a responsible adult stay with you. It is important to have someone help care for you until you are awake and alert.  Rest as needed.  Do not: ? Participate in activities in which you could fall or become  injured. ? Drive. ? Use heavy machinery. ? Drink alcohol. ? Take sleeping pills or medicines that cause drowsiness. ? Make important decisions or sign legal documents. ? Take care of children on your own. Eating and drinking  Follow the diet that is recommended by your health care provider.  If you vomit, drink water, juice, or soup when you can drink without vomiting.  Make sure you have little or no nausea before eating solid foods. General instructions  Take over-the-counter and prescription medicines only as told by your health care provider.  If you have sleep apnea, surgery and  certain medicines can increase your risk for breathing problems. Follow instructions from your health care provider about wearing your sleep device: ? Anytime you are sleeping, including during daytime naps. ? While taking prescription pain medicines, sleeping medicines, or medicines that make you drowsy.  If you smoke, do not smoke without supervision.  Keep all follow-up visits as told by your health care provider. This is important. Contact a health care provider if:  You keep feeling nauseous or you keep vomiting.  You feel light-headed.  You develop a rash.  You have a fever. Get help right away if:  You have trouble breathing. Summary  For several hours after your procedure, you may feel sleepy and have poor judgment.  Have a responsible adult stay with you for at least 24 hours or until you are awake and alert. This information is not intended to replace advice given to you by your health care provider. Make sure you discuss any questions you have with your health care provider. Document Released: 06/24/2015 Document Revised: 10/17/2016 Document Reviewed: 06/24/2015 Elsevier Interactive Patient Education  2019 [employer].

## 2018-04-15 NOTE — Anesthesia Postprocedure Evaluation (Signed)
Anesthesia Post Note  Patient: Leah Lynch  Procedure(s) Performed: COLONOSCOPY WITH PROPOFOL (N/A ) ESOPHAGOGASTRODUODENOSCOPY (EGD) WITH PROPOFOL (N/A ) BIOPSY POLYPECTOMY  Patient location during evaluation: PACU Anesthesia Type: MAC Level of consciousness: awake and alert and patient cooperative Pain management: satisfactory to patient Vital Signs Assessment: post-procedure vital signs reviewed and stable Respiratory status: spontaneous breathing Cardiovascular status: stable Postop Assessment: no apparent nausea or vomiting Anesthetic complications: no     Last Vitals:  Vitals:   04/15/18 0917  BP: 116/61  Resp: 20  Temp: 36.9 C  SpO2: 94%    Last Pain:  Vitals:   04/15/18 1008  TempSrc:   PainSc: 0-No pain                 Vernessa Likes

## 2018-04-15 NOTE — Transfer of Care (Signed)
Immediate Anesthesia Transfer of Care Note  Patient: Leah Lynch  Procedure(s) Performed: COLONOSCOPY WITH PROPOFOL (N/A ) ESOPHAGOGASTRODUODENOSCOPY (EGD) WITH PROPOFOL (N/A ) BIOPSY POLYPECTOMY  Patient Location: Short stay  Anesthesia Type:MAC  Level of Consciousness: awake, alert  and patient cooperative  Airway & Oxygen Therapy: Patient Spontanous Breathing  Post-op Assessment: Report given to RN and Post -op Vital signs reviewed and stable  Post vital signs: Reviewed and stable  Last Vitals:  Vitals Value Taken Time  BP    Temp    Pulse    Resp    SpO2      Last Pain:  Vitals:   04/15/18 1008  TempSrc:   PainSc: 0-No pain      Patients Stated Pain Goal: 7 (86/38/17 7116)  Complications: No apparent anesthesia complications

## 2018-04-15 NOTE — Op Note (Signed)
[facility] Patient Name: Leah Lynch Procedure Date: 04/15/2018 9:52 AM MRN: [id number] Date of Birth: 07/28/1945 Attending MD: Norvel Richards , MD CSN: [id number] Age: 73 Admit Type: Outpatient Procedure:                Colonoscopy Indications:              Chronic diarrhea Providers:                Norvel Richards, MD, Jeanann Lewandowsky. Sharon Seller, RN,                            Gerome Sam, RN, Aram Candela Referring MD:              Medicines:                Propofol per Anesthesia Complications:            No immediate complications. Estimated Blood Loss:     Estimated blood loss was minimal. Procedure:                Pre-Anesthesia Assessment:                           - Prior to the procedure, a History and Physical                            was performed, and patient medications and                            allergies were reviewed. The patient's tolerance of                            previous anesthesia was also reviewed. The risks                            and benefits of the procedure and the sedation                            options and risks were discussed with the patient.                            All questions were answered, and informed consent                            was obtained. Prior Anticoagulants: The patient has                            taken no previous anticoagulant or antiplatelet                            agents. ASA Grade Assessment: II - A patient with                            mild systemic disease. After reviewing the risks  and benefits, the patient was deemed in                            satisfactory condition to undergo the procedure.                           After obtaining informed consent, the colonoscope                            was passed under direct vision. Throughout the                            procedure, the patient's blood pressure, pulse, and                            oxygen  saturations were monitored continuously. The                            CF-[id number] ([id number]) scope was introduced through                            the anus and advanced to the 3 cm into the ileum.                            The ileocecal valve, appendiceal orifice, and                            rectum were photographed. The entire colon was well                            visualized. Scope In: 10:21:02 AM Scope Out: 10:33:19 AM Scope Withdrawal Time: 0 hours 10 minutes 41 seconds  Total Procedure Duration: 0 hours 12 minutes 17 seconds  Findings:      The perianal and digital rectal examinations were normal.      A 7 mm polyp was found in the splenic flexure. The polyp was sessile.       The polyp was removed with a cold snare. Resection and retrieval were       complete. Estimated blood loss was minimal.      A 4 mm polyp was found in the rectum. The polyp was sessile. The polyp       was removed with a cold snare. Resection and retrieval were complete.       Estimated blood loss was minimal.      The exam was otherwise without abnormality on direct and retroflexion       views. Distal 3 cm of terminal ileum appeared normal. Segmental biopsies       of the left and right colon taken. Small mamillation's in the rectum and       rectosigmoid not manipulated. Impression:               - One 7 mm polyp at the splenic flexure, removed                            with a cold snare. Resected and retrieved.                           -  One 4 mm polyp in the rectum, removed with a cold                            snare. Resected and retrieved.                           - The examination was otherwise normal on direct                            and retroflexion views. Post segmental biopsy.                            Normal terminal ileum. Moderate Sedation:      Moderate (conscious) sedation was personally administered by an       anesthesia professional. The following parameters were  monitored: oxygen       saturation, heart rate, blood pressure, respiratory rate, EKG, adequacy       of pulmonary ventilation, and response to care. Recommendation:           - Patient has a contact number available for                            emergencies. The signs and symptoms of potential                            delayed complications were discussed with the                            patient. Return to normal activities tomorrow.                            Written discharge instructions were provided to the                            patient.                           - Advance diet as tolerated.                           - Repeat colonoscopy date to be determined after                            pending pathology results are reviewed for                            surveillance based on pathology results.                           - Return to GI office (date not yet determined). Procedure Code(s):        --- Professional ---                           [phone], Colonoscopy, flexible; with removal of  tumor(s), polyp(s), or other lesion(s) by snare                            technique Diagnosis Code(s):        --- Professional ---                           D12.3, Benign neoplasm of transverse colon (hepatic                            flexure or splenic flexure)                           K62.1, Rectal polyp                           K52.9, Noninfective gastroenteritis and colitis,                            unspecified CPT copyright 2018 American Medical Association. All rights reserved. The codes documented in this report are preliminary and upon coder review may  be revised to meet current compliance requirements. [name] , MD Norvel Richards, MD 04/15/2018 10:42:20 AM This report has been signed electronically. Number of Addenda: 0

## 2018-04-15 NOTE — Anesthesia Preprocedure Evaluation (Signed)
Anesthesia Evaluation    Airway Mallampati: III     Mouth opening: Limited Mouth Opening  Dental  (+) Missing   Pulmonary shortness of breath, Current Smoker,           Cardiovascular hypertension, On Medications  Rhythm:regular     Neuro/Psych    GI/Hepatic GERD  ,  Endo/Other    Renal/GU      Musculoskeletal   Abdominal   Peds  Hematology   Anesthesia Other Findings   Reproductive/Obstetrics                             Anesthesia Physical Anesthesia Plan  ASA: III  Anesthesia Plan: MAC   Post-op Pain Management:    Induction:   PONV Risk Score and Plan:   Airway Management Planned:   Additional Equipment:   Intra-op Plan:   Post-operative Plan:   Informed Consent: I have reviewed the patients History and Physical, chart, labs and discussed the procedure including the risks, benefits and alternatives for the proposed anesthesia with the patient or authorized representative who has indicated his/her understanding and acceptance.       Plan Discussed with: Anesthesiologist  Anesthesia Plan Comments:         Anesthesia Quick Evaluation

## 2018-04-15 NOTE — Anesthesia Procedure Notes (Signed)
Procedure Name: MAC Date/Time: 04/15/2018 10:05 AM Performed by: Vista Deck, CRNA Pre-anesthesia Checklist: Patient identified, Emergency Drugs available, Suction available, Timeout performed and Patient being monitored Patient Re-evaluated:Patient Re-evaluated prior to induction Oxygen Delivery Method: Non-rebreather mask

## 2018-04-15 NOTE — H&P (Signed)
@LOGO @   Primary Care Physician:  Lemmie Evens, MD Primary Gastroenterologist:  Dr. Gala Romney  Pre-Procedure History & Physical: HPI:  Leah Lynch is a 73 y.o. female here for here for further evaluation of early satiety and chronic diarrhea of the EGD and colonoscopy.  Denies dysphagia.  Patient tells me when she stopped taking Celebrex her early satiety and diarrhea have almost resolved.  Past Medical History:  Diagnosis Date  . Fibrocystic breast   . GERD (gastroesophageal reflux disease)   . Hyperlipidemia   . Hypertension   . Osteoarthritis   . Osteoporosis   . Shortness of breath dyspnea     Past Surgical History:  Procedure Laterality Date  . ABDOMINAL HYSTERECTOMY    . BREAST SURGERY     removal of cyst from right breast-fibrocystsic  . BUNIONECTOMY  12/19/2010   Procedure: Lillard Anes;  Surgeon: Marcheta Grammes;  Location: [facility];  Service: Orthopedics;  Laterality: Right;  [employer], [name] Bunionectomy  . BUNIONECTOMY  05/22/2011   Procedure: Lillard Anes;  Surgeon: Marcheta Grammes, DPM;  Location: [facility];  Service: Orthopedics;  Laterality: Left;  [name] Bunionectomy Left Foot  . CAPSULOTOMY  01/29/2012   Procedure: CAPSULOTOMY;  Surgeon: Marcheta Grammes, DPM;  Location: [facility];  Service: Orthopedics;  Laterality: Left;  . CATARACT EXTRACTION W/PHACO Right 08/09/2015   Procedure: CATARACT EXTRACTION PHACO AND INTRAOCULAR LENS PLACEMENT RIGHT EYE CDE=7.95;  Surgeon: Tonny Branch, MD;  Location: [facility];  Service: Ophthalmology;  Laterality: Right;  . CATARACT EXTRACTION W/PHACO Left 08/27/2015   Procedure: CATARACT EXTRACTION PHACO AND INTRAOCULAR LENS PLACEMENT LEFT EYE; CDE:  6.12;  Surgeon: Tonny Branch, MD;  Location: [facility];  Service: Ophthalmology;  Laterality: Left;  . COLONOSCOPY  2012   Dr. Gala Romney: internal hemorrhoids, hyperplastic polyps. Surveillance 2022  . DIAGNOSTIC LAPAROSCOPY    . FOOT GANGLION EXCISION     right foot  .  METATARSAL OSTEOTOMY  05/22/2011   Procedure: METATARSAL OSTEOTOMY;  Surgeon: Marcheta Grammes, DPM;  Location: [facility];  Service: Orthopedics;  Laterality: Left;  [name] Osteotomy Left Foot  . TONSILLECTOMY  age 45  . TRIGGER FINGER RELEASE Right 01/28/2018   Procedure: RIGHT TRIGGER THUMB RELEASE;  Surgeon: Carole Civil, MD;  Location: [facility];  Service: Orthopedics;  Laterality: Right;  . TUBAL LIGATION      Prior to Admission medications   Medication Sig Start Date End Date Taking? Authorizing Provider  alendronate (FOSAMAX) 70 MG tablet Take 70 mg by mouth every Sunday.  12/13/16  Yes [provider]  atenolol-chlorthalidone (TENORETIC) 50-25 MG per tablet Take 0.5 tablets by mouth daily.    Yes [provider]  atorvastatin (LIPITOR) 40 MG tablet Take 40 mg by mouth daily.  02/23/17  Yes [provider]  B Complex-C (SUPER B COMPLEX PO) Take 2 tablets by mouth daily.    Yes [provider]  celecoxib (CELEBREX) 200 MG capsule Take 200 mg by mouth daily.  09/17/16  Yes [provider]  Cholecalciferol (VITAMIN D3) 50 MCG (2000 UT) capsule Take 2,000 Units by mouth daily.   Yes [provider]  Chromium 1000 MCG TABS Take 1,000 mcg by mouth daily.    Yes [provider]  HYDROcodone-acetaminophen (NORCO) 10-325 MG tablet Take 1 tablet by mouth 3 (three) times daily as needed for moderate pain.    Yes [provider]  omeprazole (PRILOSEC) 20 MG capsule Take 20 mg by mouth daily.   Yes [provider]  polyethylene glycol-electrolytes (TRILYTE) 420 g solution Take 4,000 mLs by mouth as directed. 02/22/18  Yes Jakeia Carreras, Cristopher Estimable, MD  potassium chloride (KLOR-CON) 10 MEQ CR tablet Take 20 mEq by mouth daily.    Yes [provider]  PROAIR HFA 108 (90 Base) MCG/ACT inhaler Inhale 2 puffs into the lungs every 6 (six) hours as needed for wheezing or shortness of breath.  05/22/15  Yes [provider]   VIBERZI 100 MG TABS TAKE 1 TABLET BY MOUTH TWICE DAILY WITH MEALS. 03/25/18   Carlis Stable, NP    Allergies as of 02/22/2018 - Review Complete 02/22/2018  Allergen Reaction Noted  . Bee venom Swelling 11/11/2010  . Penicillins Hives and Itching 06/27/2010  . Sulfa antibiotics Itching 11/11/2010    Family History  Problem Relation Age of Onset  . Anesthesia problems Neg Hx   . Hypotension Neg Hx   . Malignant hyperthermia Neg Hx   . Pseudochol deficiency Neg Hx   . Colon cancer Neg Hx     Social History   Socioeconomic History  . Marital status: Divorced    Spouse name: Not on file  . Number of children: Not on file  . Years of education: Not on file  . Highest education level: Not on file  Occupational History  . Not on file  Social Needs  . Financial resource strain: Not on file  . Food insecurity:    Worry: Not on file    Inability: Not on file  . Transportation needs:    Medical: Not on file    Non-medical: Not on file  Tobacco Use  . Smoking status: Current Every Day Smoker    Packs/day: 0.50    Years: 25.00    Pack years: 12.50    Types: Cigarettes  . Smokeless tobacco: Never Used  Substance and Sexual Activity  . Alcohol use: No  . Drug use: No  . Sexual activity: Yes    Birth control/protection: Post-menopausal  Lifestyle  . Physical activity:    Days per week: Not on file    Minutes per session: Not on file  . Stress: Not on file  Relationships  . Social connections:    Talks on phone: Not on file    Gets together: Not on file    Attends religious service: Not on file    Active member of club or organization: Not on file    Attends meetings of clubs or organizations: Not on file    Relationship status: Not on file  . Intimate partner violence:    Fear of current or ex partner: Not on file    Emotionally abused: Not on file    Physically abused: Not on file    Forced sexual activity: Not on file  Other Topics Concern  . Not on file  Social  History Narrative  . Not on file    Review of Systems: See HPI, otherwise negative ROS  Physical Exam: BP 116/61   Temp 98.4 F (36.9 C) (Oral)   Resp 20   Ht 4\' 11"  (1.499 m)   Wt 60.3 kg   SpO2 94%   BMI 26.86 kg/m  General:   Alert,  Well-developed, well-nourished, pleasant and cooperative in NAD Neck:  Supple; no masses or thyromegaly. No significant cervical adenopathy. Lungs:  Clear throughout to auscultation.   No wheezes, crackles, or rhonchi. No acute distress. Heart:  Regular rate and rhythm; no murmurs, clicks, rubs,  or gallops.  Abdomen: Non-distended, normal bowel sounds.  Soft and nontender without appreciable mass or hepatosplenomegaly.  Pulses:  Normal pulses noted. Extremities:  Without clubbing or edema.  Impression/Plan: Pleasant elderly lady with early satiety and chronic diarrhea.  Recent Improvement in symptoms with discontinuation of Celebrex.  EGD and colonoscopy now be done to further evaluate her symptoms. The risks, benefits, limitations, imponderables and alternatives regarding both EGD and colonoscopy have been reviewed with the patient. Questions have been answered. All parties agreeable.      Notice: This dictation was prepared with Dragon dictation along with smaller phrase technology. Any transcriptional errors that result from this process are unintentional and may not be corrected upon review.

## 2018-04-19 ENCOUNTER — Encounter: Payer: Self-pay | Admitting: Internal Medicine

## 2018-04-19 ENCOUNTER — Telehealth: Payer: Self-pay

## 2018-04-19 NOTE — Telephone Encounter (Signed)
PA was denied for Viber[place]. If the patient has a history of failure, contraindication, or intolerance to loperamide, antispasmodic(s) (e.g., dicyclomine, hyoscyamine), or tricyclic antidepressant(s) (e.g., amitriptyline, imipramine, desipramine, doxepin or trimipramine. Please advise on next steps.

## 2018-04-19 NOTE — Telephone Encounter (Signed)
Per RMR- Send letter to patient.  Send copy of letter with path to referring provider and PCP.   Offer follow-up with extender between now and springtime regarding chronic diarrhea if needed

## 2018-04-20 NOTE — Telephone Encounter (Signed)
Forward to AB who last saw the patient.

## 2018-04-20 NOTE — Telephone Encounter (Signed)
She has tried and failed anti-spasmodics, OTC imodium, and I would not prescribe TCAs in this situation as that would promote anticholinergic effects and potential other harmful effects. Both anti-spasmodics and TCAs should be avoided in the older adult. The [place] has outlined this in the [employer] Criteria med list. I would select that she has a contraindication to all of those they are recommending.   If I need to write a letter, I will.

## 2018-04-21 NOTE — Telephone Encounter (Signed)
OV made °

## 2018-04-21 NOTE — Telephone Encounter (Signed)
AB, I tried to submit another PA and was told I couldn't since it was denied. They would like a letter submitted to the appeals department listing the medications that were contraindicated.

## 2018-04-26 NOTE — Telephone Encounter (Signed)
Will work on this. Let's provide her samples in the meantime.

## 2018-04-28 NOTE — Telephone Encounter (Signed)
Noted Lmom, waiting on a return call.  

## 2018-04-28 NOTE — Telephone Encounter (Signed)
AB, I spoke with pt and she stated that since she d/c Celebrex, she doesn't need anything and hasn't had any problems.

## 2018-04-28 NOTE — Telephone Encounter (Signed)
Great!

## 2018-04-30 ENCOUNTER — Encounter (HOSPITAL_COMMUNITY): Payer: Self-pay | Admitting: Internal Medicine

## 2018-05-24 ENCOUNTER — Ambulatory Visit: Payer: Medicare Other | Admitting: Gastroenterology

## 2018-07-28 ENCOUNTER — Encounter: Payer: Self-pay | Admitting: Gastroenterology

## 2018-07-28 ENCOUNTER — Other Ambulatory Visit: Payer: Self-pay

## 2018-07-28 ENCOUNTER — Ambulatory Visit (INDEPENDENT_AMBULATORY_CARE_PROVIDER_SITE_OTHER): Payer: Medicare Other | Admitting: Gastroenterology

## 2018-07-28 DIAGNOSIS — R197 Diarrhea, unspecified: Secondary | ICD-10-CM

## 2018-07-28 NOTE — Patient Instructions (Signed)
I am glad you are doing well!  Please call if any recurrent diarrhea that is not controlled.  We will see you in 1 year or sooner if needed!  I enjoyed seeing you again today! As you know, I value our relationship and want to provide genuine, compassionate, and quality care. I welcome your feedback. If you receive a survey regarding your visit,  I greatly appreciate you taking time to fill this out. See you next time!  Annitta Needs, PhD, ANP-BC [facility]

## 2018-07-28 NOTE — Progress Notes (Signed)
Referring Provider:  Primary Care Physician:  Lemmie Evens, MD  Primary GI:   Virtual Visit via Telephone Note Due to COVID-19, visit is conducted virtually and was requested by patient.   I connected with Leah Lynch on 07/28/18 at  2:30 PM EDT by telephone and verified that I am speaking with the correct person using two identifiers.   I discussed the limitations, risks, security and privacy concerns of performing an evaluation and management service by telephone and the availability of in person appointments. I also discussed with the patient that there may be a patient responsible charge related to this service. The patient expressed understanding and agreed to proceed.  Chief Complaint  Patient presents with  . Diarrhea    f/u. None at this time  . Irritable Bowel Syndrome    f/u. Doing okay no concerns     History of Present Illness: Pleasant 73 year old female with history of IBS, early satiety most recently, with recent colonscopy/EGD in Jan 2020. Normal EGD.  Colonoscopy with hyperplastic polyps and segmental biopsies normal. Normal TI.[name]   Occasional diarrhea if eats something that doesn't agree with her. No abdominal pain. No N/V. Has her appetite back. Some days won't eat a lot, some days can't get enough. Been afraid to weigh herself. No dysphagia. Feels much improved from before. Knows the foods that trigger diarrhea. No concerns today. Omeprazole daily for GERD.   Past Medical History:  Diagnosis Date  . Fibrocystic breast   . GERD (gastroesophageal reflux disease)   . Hyperlipidemia   . Hypertension   . Osteoarthritis   . Osteoporosis   . Shortness of breath dyspnea      Past Surgical History:  Procedure Laterality Date  . ABDOMINAL HYSTERECTOMY    . BIOPSY  04/15/2018   Procedure: BIOPSY;  Surgeon: Daneil Dolin, MD;  [facility];  Service: Endoscopy;;  colon  . BREAST SURGERY     removal of cyst from right breast-fibrocystsic   . BUNIONECTOMY  12/19/2010   Procedure: Lillard Anes;  Surgeon: Marcheta Grammes;  Location: [facility];  Service: Orthopedics;  Laterality: Right;  [employer], [name] Bunionectomy  . BUNIONECTOMY  05/22/2011   Procedure: Lillard Anes;  Surgeon: Marcheta Grammes, DPM;  Location: [facility];  Service: Orthopedics;  Laterality: Left;  [name] Bunionectomy Left Foot  . CAPSULOTOMY  01/29/2012   Procedure: CAPSULOTOMY;  Surgeon: Marcheta Grammes, DPM;  Location: [facility];  Service: Orthopedics;  Laterality: Left;  . CATARACT EXTRACTION W/PHACO Right 08/09/2015   Procedure: CATARACT EXTRACTION PHACO AND INTRAOCULAR LENS PLACEMENT RIGHT EYE CDE=7.95;  Surgeon: Tonny Branch, MD;  Location: [facility];  Service: Ophthalmology;  Laterality: Right;  . CATARACT EXTRACTION W/PHACO Left 08/27/2015   Procedure: CATARACT EXTRACTION PHACO AND INTRAOCULAR LENS PLACEMENT LEFT EYE; CDE:  6.12;  Surgeon: Tonny Branch, MD;  Location: [facility];  Service: Ophthalmology;  Laterality: Left;  . COLONOSCOPY  2012   Dr. Gala Romney: internal hemorrhoids, hyperplastic polyps. Surveillance 2022  . COLONOSCOPY WITH PROPOFOL N/A 04/15/2018   one 7 mm polyp at splenic flexure, one 4 mm polyp in rectum, otherwise normal. S/p segmental biopsy:normal. Normal TI  . DIAGNOSTIC LAPAROSCOPY    . ESOPHAGOGASTRODUODENOSCOPY (EGD) WITH PROPOFOL N/A 04/15/2018   normal  . FOOT GANGLION EXCISION     right foot  . METATARSAL OSTEOTOMY  05/22/2011   Procedure: METATARSAL OSTEOTOMY;  Surgeon: Marcheta Grammes, DPM;  [facility]: [facility];  Service: Orthopedics;  Laterality: Left;  [name] Osteotomy Left Foot  . POLYPECTOMY  04/15/2018   Procedure: POLYPECTOMY;  Surgeon: Daneil Dolin, MD;  [facility];  Service: Endoscopy;;  colon  . TONSILLECTOMY  age 74  . TRIGGER FINGER RELEASE Right 01/28/2018   Procedure: RIGHT TRIGGER THUMB RELEASE;  Surgeon: Carole Civil, MD;  Location: [facility];  Service: Orthopedics;  Laterality: Right;   . TUBAL LIGATION       Current Meds  Medication Sig  . atenolol-chlorthalidone (TENORETIC) 50-25 MG per tablet Take 0.5 tablets by mouth daily.   [name] atorvastatin (LIPITOR) 40 MG tablet Take 40 mg by mouth daily.   . B Complex-C (SUPER B COMPLEX PO) Take 2 tablets by mouth daily.   . Cholecalciferol (VITAMIN D3) 50 MCG (2000 UT) capsule Take 2,000 Units by mouth daily.  . Chromium 1000 MCG TABS Take 1,000 mcg by mouth daily.   [name] HYDROcodone-acetaminophen (NORCO) 10-325 MG tablet Take 1 tablet by mouth 3 (three) times daily as needed for moderate pain.   . Multiple Vitamin (MULTIVITAMIN) tablet Take 1 tablet by mouth daily.  . naproxen (NAPROSYN) 500 MG tablet Take 500 mg by mouth 2 (two) times daily with a meal.  . omeprazole (PRILOSEC) 20 MG capsule Take 20 mg by mouth daily.  . potassium chloride (KLOR-CON) 10 MEQ CR tablet Take 20 mEq by mouth daily.   [name] PROAIR HFA 108 (90 Base) MCG/ACT inhaler Inhale 2 puffs into the lungs every 6 (six) hours as needed for wheezing or shortness of breath.      Family History  Problem Relation Age of Onset  . Anesthesia problems Neg Hx   . Hypotension Neg Hx   . Malignant hyperthermia Neg Hx   . Pseudochol deficiency Neg Hx   . Colon cancer Neg Hx     Social History   Socioeconomic History  . Marital status: Divorced    Spouse name: Not on file  . Number of children: Not on file  . Years of education: Not on file  . Highest education level: Not on file  Occupational History  . Not on file  Social Needs  . Financial resource strain: Not on file  . Food insecurity:    Worry: Not on file    Inability: Not on file  . Transportation needs:    Medical: Not on file    Non-medical: Not on file  Tobacco Use  . Smoking status: Current Every Day Smoker    Packs/day: 0.50    Years: 25.00    Pack years: 12.50    Types: Cigarettes  . Smokeless tobacco: Never Used  Substance and Sexual Activity  . Alcohol use: No  . Drug use: No  . Sexual  activity: Yes    Birth control/protection: Post-menopausal  Lifestyle  . Physical activity:    Days per week: Not on file    Minutes per session: Not on file  . Stress: Not on file  Relationships  . Social connections:    Talks on phone: Not on file    Gets together: Not on file    Attends religious service: Not on file    Active member of club or organization: Not on file    Attends meetings of clubs or organizations: Not on file    Relationship status: Not on file  Other Topics Concern  . Not on file  Social History Narrative  . Not on file       Review of Systems: Gen: Denies fever,  chills, anorexia. Denies fatigue, weakness, weight loss.  CV: Denies chest pain, palpitations, syncope, peripheral edema, and claudication. Resp: Denies dyspnea at rest, cough, wheezing, coughing up blood, and pleurisy. GI: see HPI Derm: Denies rash, itching, dry skin Psych: Denies depression, anxiety, memory loss, confusion. No homicidal or suicidal ideation.  Heme: Denies bruising, bleeding, and enlarged lymph nodes.  Observations/Objective: No distress. Video call with patient alert and cooperative, no distress, maintains eye contact.   Assessment and Plan: 73 year old female with history of IBS, now well-controlled with dietary measures. Early satiety resolved. Reassuring colonoscopy/EGD on file. Continue PPI once daily. Return in 1 year or sooner if needed.   Follow Up Instructions:    I discussed the assessment and treatment plan with the patient. The patient was provided an opportunity to ask questions and all were answered. The patient agreed with the plan and demonstrated an understanding of the instructions.   The patient was advised to call back or seek an in-person evaluation if the symptoms worsen or if the condition fails to improve as anticipated.  I provided 10 minutes of face-to-face time during this encounter via video call.   Annitta Needs, PhD, ANP-BC [facility]  Gastroenterology

## 2018-07-29 NOTE — Progress Notes (Signed)
cc'ed to pcp °

## 2018-08-10 ENCOUNTER — Other Ambulatory Visit (HOSPITAL_COMMUNITY): Payer: Self-pay | Admitting: Family Medicine

## 2018-08-10 DIAGNOSIS — Z1231 Encounter for screening mammogram for malignant neoplasm of breast: Secondary | ICD-10-CM

## 2018-08-24 IMAGING — DX DG KNEE COMPLETE 4+V*L*
4 series · 4 of 4 positions shown · non-contrast
Comparison: 02/13/2014.

CLINICAL DATA: Chronic bilateral pain.  No known injury.

EXAM:
LEFT KNEE - COMPLETE 4+ VIEW

[knee ap]
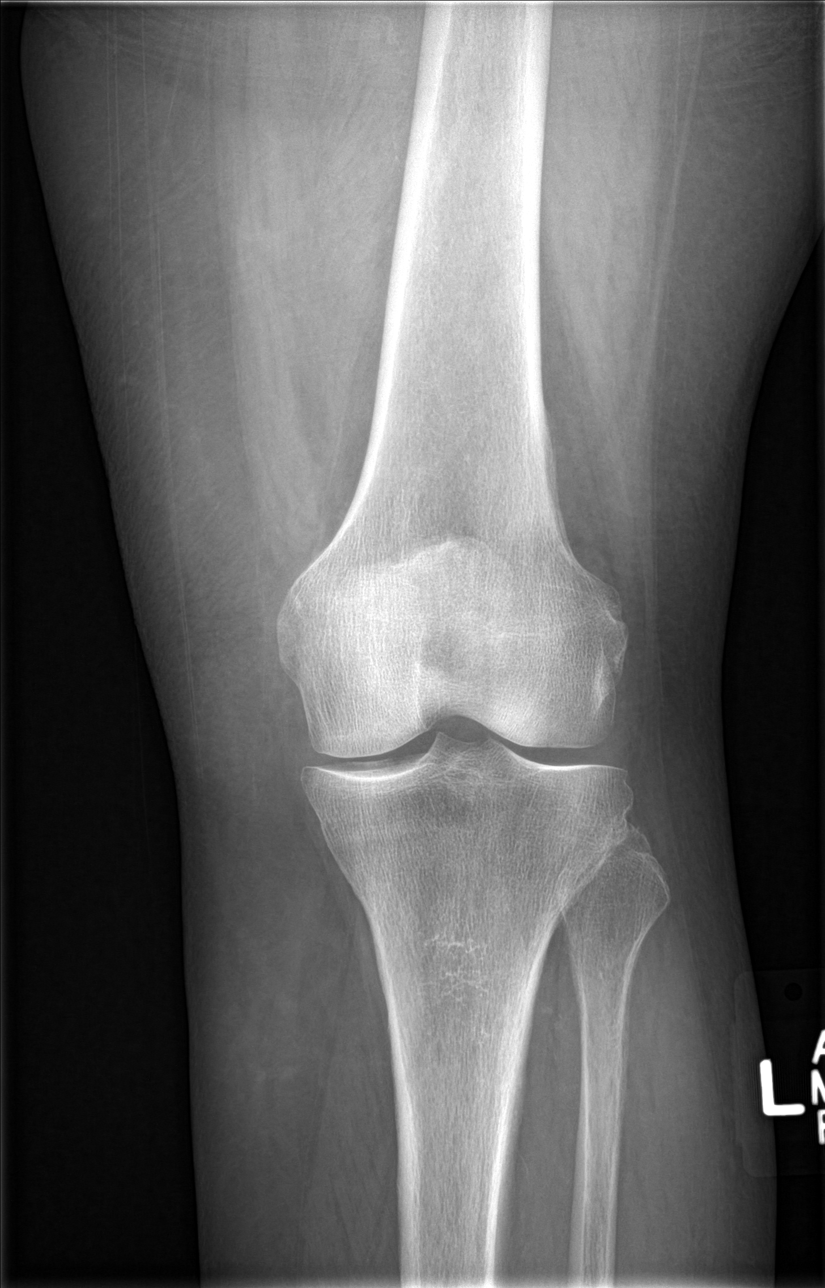

[knee obl (1 of 2)]
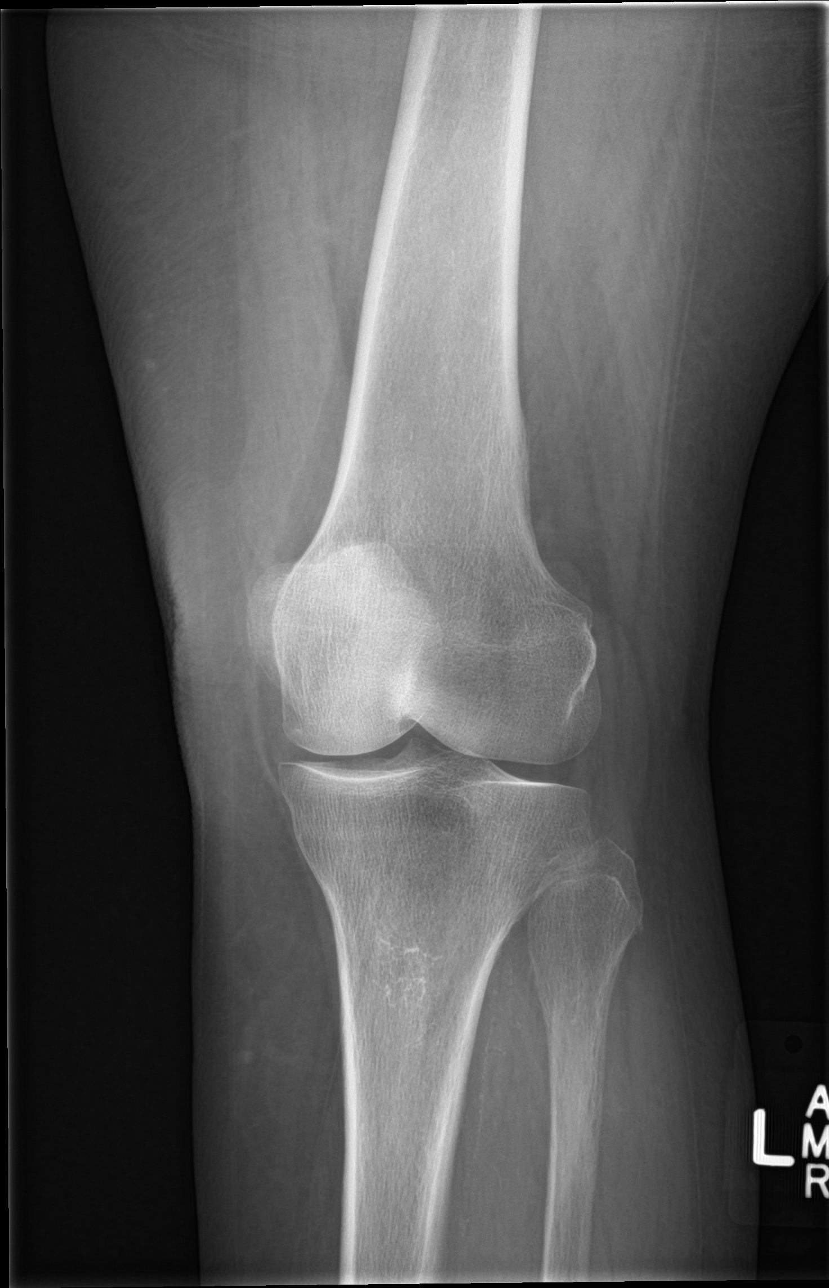

[knee obl (2 of 2)]
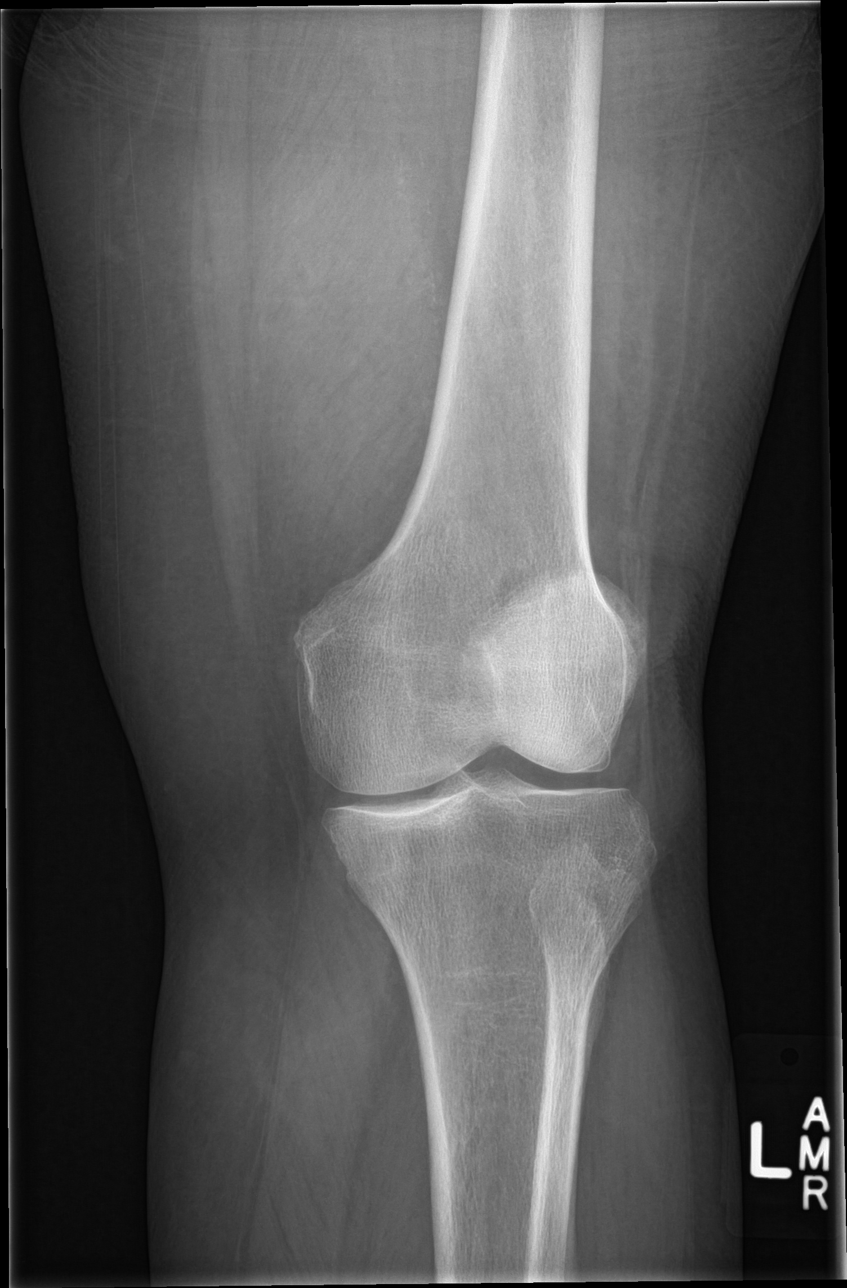

[knee lat]
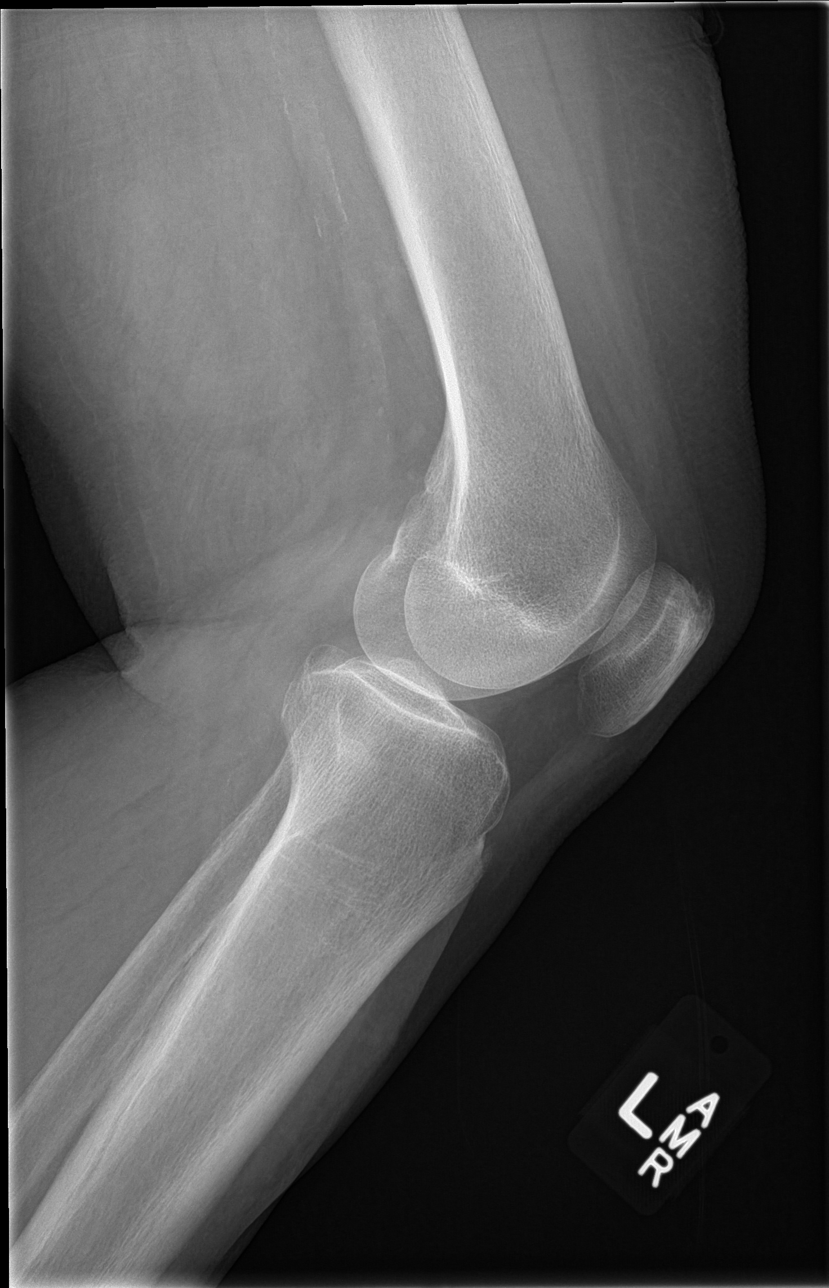

[4 of 4 positions shown; findings below may reference images not displayed]

FINDINGS: Mild medial compartment degenerative change. No acute bony or joint
abnormality. No evidence of effusion. Peripheral vascular
calcification.
IMPRESSION: 1. Mild medial compartment degenerative change. No acute bony
abnormality.

2. Peripheral vascular disease .

## 2018-12-13 ENCOUNTER — Encounter (HOSPITAL_COMMUNITY): Payer: Self-pay

## 2018-12-13 ENCOUNTER — Emergency Department (HOSPITAL_COMMUNITY): Payer: Medicare Other

## 2018-12-13 ENCOUNTER — Other Ambulatory Visit: Payer: Self-pay

## 2018-12-13 ENCOUNTER — Emergency Department (HOSPITAL_COMMUNITY)
Admission: EM | Admit: 2018-12-13 | Discharge: 2018-12-13 | Disposition: A | Discharge status: Home / Self Care | Payer: Medicare Other | Attending: Emergency Medicine | Admitting: Emergency Medicine

## 2018-12-13 DIAGNOSIS — M79621 Pain in right upper arm: Secondary | ICD-10-CM | POA: Diagnosis not present

## 2018-12-13 DIAGNOSIS — Z5321 Procedure and treatment not carried out due to patient leaving prior to being seen by health care provider: Secondary | ICD-10-CM | POA: Insufficient documentation
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
## 2018-12-13 NOTE — ED Notes (Signed)
c-collar applied  

## 2019-06-30 ENCOUNTER — Encounter: Payer: Self-pay | Admitting: Internal Medicine

## 2019-08-11 ENCOUNTER — Ambulatory Visit (HOSPITAL_COMMUNITY)
Admission: RE | Admit: 2019-08-11 | Discharge: 2019-08-11 | Disposition: A | Discharge status: Home / Self Care | Payer: Medicare Other | Source: Ambulatory Visit | Attending: Family Medicine | Admitting: Family Medicine

## 2019-08-11 ENCOUNTER — Other Ambulatory Visit: Payer: Self-pay

## 2019-08-11 DIAGNOSIS — Z1231 Encounter for screening mammogram for malignant neoplasm of breast: Secondary | ICD-10-CM | POA: Insufficient documentation

## 2020-06-20 ENCOUNTER — Ambulatory Visit: Payer: Medicare Other | Admitting: Cardiovascular Disease

## 2020-06-20 ENCOUNTER — Telehealth: Payer: Self-pay | Admitting: Cardiovascular Disease

## 2020-06-20 NOTE — Telephone Encounter (Signed)
Amy was calling to make sure the fax was receive for the patient for their first dr visit.

## 2020-06-21 NOTE — Telephone Encounter (Signed)
Advised Amy patient rescheduled with Dr Harl Bowie

## 2020-07-16 ENCOUNTER — Other Ambulatory Visit (HOSPITAL_COMMUNITY): Payer: Self-pay | Admitting: Family Medicine

## 2020-07-16 DIAGNOSIS — Z1231 Encounter for screening mammogram for malignant neoplasm of breast: Secondary | ICD-10-CM

## 2020-08-02 ENCOUNTER — Ambulatory Visit: Payer: Medicare Other | Admitting: Cardiology

## 2020-08-10 ENCOUNTER — Ambulatory Visit: Payer: Medicare Other | Admitting: Cardiology

## 2020-08-15 ENCOUNTER — Other Ambulatory Visit: Payer: Self-pay

## 2020-08-15 ENCOUNTER — Ambulatory Visit (HOSPITAL_COMMUNITY)
Admission: RE | Admit: 2020-08-15 | Discharge: 2020-08-15 | Disposition: A | Discharge status: Home / Self Care | Payer: Medicare Other | Source: Ambulatory Visit | Attending: Family Medicine | Admitting: Family Medicine

## 2020-08-15 DIAGNOSIS — Z1231 Encounter for screening mammogram for malignant neoplasm of breast: Secondary | ICD-10-CM | POA: Diagnosis not present

## 2021-05-06 ENCOUNTER — Other Ambulatory Visit (HOSPITAL_COMMUNITY): Payer: Self-pay | Admitting: Family Medicine

## 2021-05-06 DIAGNOSIS — Z780 Asymptomatic menopausal state: Secondary | ICD-10-CM

## 2021-05-06 DIAGNOSIS — M545 Low back pain, unspecified: Secondary | ICD-10-CM

## 2021-05-14 ENCOUNTER — Other Ambulatory Visit: Payer: Self-pay

## 2021-05-14 ENCOUNTER — Ambulatory Visit (HOSPITAL_COMMUNITY)
Admission: RE | Admit: 2021-05-14 | Discharge: 2021-05-14 | Disposition: A | Discharge status: Home / Self Care | Payer: Medicare Other | Source: Ambulatory Visit | Attending: Family Medicine | Admitting: Family Medicine

## 2021-05-14 DIAGNOSIS — M545 Low back pain, unspecified: Secondary | ICD-10-CM | POA: Insufficient documentation

## 2021-05-20 ENCOUNTER — Other Ambulatory Visit (HOSPITAL_COMMUNITY): Payer: Self-pay | Admitting: Family Medicine

## 2021-05-20 DIAGNOSIS — Z1231 Encounter for screening mammogram for malignant neoplasm of breast: Secondary | ICD-10-CM

## 2021-05-27 ENCOUNTER — Other Ambulatory Visit: Payer: Self-pay | Admitting: Family Medicine

## 2021-05-27 ENCOUNTER — Other Ambulatory Visit (HOSPITAL_COMMUNITY): Payer: Self-pay | Admitting: Family Medicine

## 2021-05-27 DIAGNOSIS — M545 Low back pain, unspecified: Secondary | ICD-10-CM

## 2021-06-12 ENCOUNTER — Other Ambulatory Visit: Payer: Self-pay

## 2021-06-12 ENCOUNTER — Ambulatory Visit (HOSPITAL_COMMUNITY)
Admission: RE | Admit: 2021-06-12 | Discharge: 2021-06-12 | Disposition: A | Discharge status: Home / Self Care | Payer: Medicare Other | Source: Ambulatory Visit | Attending: Family Medicine | Admitting: Family Medicine

## 2021-06-12 DIAGNOSIS — M545 Low back pain, unspecified: Secondary | ICD-10-CM | POA: Diagnosis not present

## 2021-08-19 ENCOUNTER — Ambulatory Visit (HOSPITAL_COMMUNITY)
Admission: RE | Admit: 2021-08-19 | Discharge: 2021-08-19 | Disposition: A | Discharge status: Home / Self Care | Payer: Medicare Other | Source: Ambulatory Visit | Attending: Family Medicine | Admitting: Family Medicine

## 2021-08-19 DIAGNOSIS — Z1231 Encounter for screening mammogram for malignant neoplasm of breast: Secondary | ICD-10-CM | POA: Insufficient documentation

## 2021-08-19 DIAGNOSIS — Z780 Asymptomatic menopausal state: Secondary | ICD-10-CM | POA: Insufficient documentation

## 2022-03-14 ENCOUNTER — Other Ambulatory Visit (HOSPITAL_COMMUNITY): Payer: Self-pay | Admitting: Family Medicine

## 2022-03-14 ENCOUNTER — Ambulatory Visit (HOSPITAL_COMMUNITY)
Admission: RE | Admit: 2022-03-14 | Discharge: 2022-03-14 | Disposition: A | Discharge status: Home / Self Care | Payer: Medicare Other | Source: Ambulatory Visit | Attending: Family Medicine | Admitting: Family Medicine

## 2022-03-14 DIAGNOSIS — M25511 Pain in right shoulder: Secondary | ICD-10-CM | POA: Diagnosis not present

## 2022-03-14 DIAGNOSIS — M542 Cervicalgia: Secondary | ICD-10-CM | POA: Diagnosis present

## 2022-03-28 LAB — MICROALBUMIN / CREATININE URINE RATIO: Microalb Creat Ratio: 29.999

## 2022-04-12 LAB — LAB REPORT - SCANNED
A1c: 6.8
EGFR: 92

## 2022-04-15 ENCOUNTER — Other Ambulatory Visit (HOSPITAL_COMMUNITY): Payer: Self-pay | Admitting: Family Medicine

## 2022-04-15 DIAGNOSIS — G8929 Other chronic pain: Secondary | ICD-10-CM

## 2022-05-14 ENCOUNTER — Ambulatory Visit (HOSPITAL_COMMUNITY)
Admission: RE | Admit: 2022-05-14 | Discharge: 2022-05-14 | Disposition: A | Discharge status: Home / Self Care | Payer: 59 | Source: Ambulatory Visit | Attending: Family Medicine | Admitting: Family Medicine

## 2022-05-14 DIAGNOSIS — M25512 Pain in left shoulder: Secondary | ICD-10-CM | POA: Insufficient documentation

## 2022-05-14 DIAGNOSIS — G8929 Other chronic pain: Secondary | ICD-10-CM

## 2022-05-16 ENCOUNTER — Ambulatory Visit: Payer: 59 | Admitting: Orthopedic Surgery

## 2022-05-20 ENCOUNTER — Encounter: Payer: Self-pay | Admitting: Orthopedic Surgery

## 2022-05-20 ENCOUNTER — Ambulatory Visit (INDEPENDENT_AMBULATORY_CARE_PROVIDER_SITE_OTHER): Payer: 59 | Admitting: Orthopedic Surgery

## 2022-05-20 VITALS — BP 125/62 | HR 72 | Ht 59.0 in | Wt 140.0 lb

## 2022-05-20 DIAGNOSIS — M12812 Other specific arthropathies, not elsewhere classified, left shoulder: Secondary | ICD-10-CM

## 2022-05-20 NOTE — Patient Instructions (Signed)
Today we discussed proceeding with reverse shoulder arthroplasty for her left shoulder.  We will need to obtain medical clearance, to ensure that you are healthy enough to undergo anesthesia  You will need to stop smoking for at least 1 month prior to surgery  Once we have clearance, and he is stop smoking, we can work to obtain a CT scan and schedule surgery

## 2022-05-20 NOTE — Progress Notes (Signed)
New Patient Visit  Assessment: Leah Lynch is a 77 y.o. female with the following: Left shoulder rotator cuff arthropathy  Plan: Leah Lynch has a large superior rotator cuff tear, with retraction.  She is demonstrating pseudoparalysis.  MRI shows atrophy of the musculature.  Based on the findings on imaging, and her desire to proceed with intervention in order to improve her pain and function, we discussed a reverse shoulder arthroplasty.  The procedure was discussed.  Questions were answered.  I provided pamphlets for her to review.  If she is interested in proceeding with reverse shoulder arthroplasty, she will need to obtain medical clearance for surgery.  We will contact Dr. Karie Kirks.  In addition, she needs to stop smoking for at least 1 month prior to surgery.  This was explicitly explained to her.  She states her understanding.  Once we obtain medical clearance, we can work to schedule a CT scan, with further consideration for surgery.  She states her understanding.  She will follow-up after obtaining medical clearance.  Follow-up: Return for After medical clearance for OR.  Subjective:  Chief Complaint  Patient presents with   Shoulder Pain    Lt shoulder hurting for the past 6 mos., getting worse. 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     History of Present Illness: Leah Lynch is a 77 y.o. female who has been referred by Lemmie Evens, MD for evaluation of left shoulder pain.  She has had progressively worsening left shoulder pain for the past 6 months.  No history of an injury.  She states she does pull herself into her truck using her left arm.  Otherwise, she is not sure what could have caused her problems.  She reports worsening pain, as well as decreased function.  She is difficulty with getting her hand above the level of her shoulder.  Medications have not been effective.  She has  worked with therapy.  She is had injections in the past.   Review of Systems: No fevers or chills No numbness or tingling No chest pain No shortness of breath No bowel or bladder dysfunction No GI distress No headaches   Medical History:  Past Medical History:  Diagnosis Date   Fibrocystic breast    GERD (gastroesophageal reflux disease)    Hyperlipidemia    Hypertension    Osteoarthritis    Osteoporosis    Shortness of breath dyspnea     Past Surgical History:  Procedure Laterality Date   ABDOMINAL HYSTERECTOMY     BIOPSY  04/15/2018   Procedure: BIOPSY;  Surgeon: Daneil Dolin, MD;  [facility];  Service: Endoscopy;;  colon   BREAST SURGERY     removal of cyst from right breast-fibrocystsic   BUNIONECTOMY  12/19/2010   Procedure: BUNIONECTOMY;  Surgeon: Marcheta Grammes;  Location: [facility];  Service: Orthopedics;  Laterality: Right;  [name] Bunion[place]   BUNIONECTOMY  05/22/2011   Procedure: Lillard Anes;  Surgeon: Marcheta Grammes, DPM;  Location: [facility];  Service: Orthopedics;  Laterality: Left;  [name] Bunionectomy Left Foot   CAPSULOTOMY  01/29/2012   Procedure: CAPSULOTOMY;  Surgeon: Marcheta Grammes, DPM;  Location: [facility];  Service: Orthopedics;  Laterality: Left;   CATARACT EXTRACTION W/PHACO Right 08/09/2015   Procedure: CATARACT EXTRACTION PHACO AND INTRAOCULAR LENS PLACEMENT RIGHT EYE CDE=7.95;  Surgeon: Tonny Branch, MD;  Location: [facility];  Service: Ophthalmology;  Laterality: Right;   CATARACT EXTRACTION W/PHACO Left 08/27/2015   Procedure: CATARACT EXTRACTION PHACO AND INTRAOCULAR LENS PLACEMENT LEFT EYE; CDE:  6.12;  Surgeon: Tonny Branch, MD;  Location: [facility];  Service: Ophthalmology;  Laterality: Left;   COLONOSCOPY  2012   Dr. Gala Romney: internal hemorrhoids, hyperplastic polyps. Surveillance 2022   COLONOSCOPY WITH PROPOFOL N/A 04/15/2018   one 7 mm polyp at splenic flexure, one 4 mm polyp in rectum, otherwise  normal. S/p segmental biopsy:normal. Normal TI   DIAGNOSTIC LAPAROSCOPY     ESOPHAGOGASTRODUODENOSCOPY (EGD) WITH PROPOFOL N/A 04/15/2018   normal   FOOT GANGLION EXCISION     right foot   METATARSAL OSTEOTOMY  05/22/2011   Procedure: METATARSAL OSTEOTOMY;  Surgeon: Marcheta Grammes, DPM;  Location: [facility];  Service: Orthopedics;  Laterality: Left;  [name] Osteotomy Left Foot   POLYPECTOMY  04/15/2018   Procedure: POLYPECTOMY;  Surgeon: Daneil Dolin, MD;  [facility];  Service: Endoscopy;;  colon   TONSILLECTOMY  age 81   TRIGGER FINGER RELEASE Right 01/28/2018   Procedure: RIGHT TRIGGER THUMB RELEASE;  Surgeon: Carole Civil, MD;  Location: [facility];  Service: Orthopedics;  Laterality: Right;   TUBAL LIGATION      Family History  Problem Relation Age of Onset   Anesthesia problems Neg Hx    Hypotension Neg Hx    Malignant hyperthermia Neg Hx    Pseudochol deficiency Neg Hx    Colon cancer Neg Hx    Social History   Tobacco Use   Smoking status: Every Day    Packs/day: 0.50    Years: 25.00    Total pack years: 12.50    Types: Cigarettes   Smokeless tobacco: Never  Substance Use Topics   Alcohol use: No   Drug use: No    Allergies  Allergen Reactions   Bee Venom Swelling   Penicillins Hives, Itching and Other (See Comments)    Has patient had a PCN reaction causing immediate rash, facial/tongue/throat swelling, SOB or lightheadedness with hypotension: Yes Has patient had a PCN reaction causing severe rash involving mucus membranes or skin necrosis: No Has patient had a PCN reaction that required hospitalization No Has patient had a PCN reaction occurring within the last 10 years: YES If all of the above answers are "NO", then may proceed with Cephalosporin use.    Sulfa Antibiotics Itching    Current Meds  Medication Sig   atenolol (TENORMIN) 25 MG tablet Take 25 mg by mouth daily.   atorvastatin (LIPITOR) 40 MG tablet Take 40 mg by mouth  daily.    B Complex-C (SUPER B COMPLEX PO) Take 2 tablets by mouth daily.    chlorthalidone (HYGROTON) 25 MG tablet Take 25 mg by mouth daily.   Cholecalciferol (VITAMIN D3) 50 MCG (2000 UT) capsule Take 2,000 Units by mouth daily.   Chromium 1000 MCG TABS Take 1,000 mcg by mouth daily.    gabapentin (NEURONTIN) 300 MG capsule Take 300 mg by mouth 3 (three) times daily.   HYDROcodone-acetaminophen (NORCO) 10-325 MG tablet Take 1 tablet by mouth 3 (three) times daily as needed for moderate pain.    Multiple Vitamin (MULTIVITAMIN) tablet Take 1 tablet by mouth daily.   naproxen (NAPROSYN) 500 MG tablet Take 500 mg by mouth 2 (two) times daily with a meal.   omeprazole (PRILOSEC) 20 MG capsule Take 20 mg by mouth daily.   potassium chloride (KLOR-CON) 10 MEQ CR tablet Take 20  mEq by mouth daily.     Objective: BP 125/62   Pulse 72   Ht '4\' 11"'$  (1.499 m)   Wt 140 lb (63.5 kg)   BMI 28.28 kg/m   Physical Exam:  General: Alert and oriented. and No acute distress. Gait: Normal gait.  Evaluation left shoulder demonstrates no deformity.  Atrophy of the posterior shoulder muscles.  No redness.  No bruising.  Forward flexion limited to 90 degrees.  She is able to pseudoparalysis.  Fingers are warm and well-perfused.  Sensation intact in the axillary nerve distribution.  2+ radial pulse.  IMAGING: I personally reviewed images previously obtained in clinic  Left shoulder MRI IMPRESSION: Complete supraspinatus and infraspinatus tendon tears with tendon retraction to the glenohumeral joint and moderate fatty atrophy of the muscle bellies.   Severe subscapularis tendinopathy with a large longitudinal split tear of the tendon present. There is also moderate subscapularis atrophy.   Complete tear of the long head of biceps from the superior labrum.   Moderate acromioclavicular and mild glenohumeral osteoarthritis.   Degenerative tearing of the superior and posterior labrum.  New  Medications:  No orders of the defined types were placed in this encounter.     Mordecai Rasmussen, MD  05/20/2022 3:19 PM

## 2022-05-27 ENCOUNTER — Telehealth: Payer: Self-pay | Admitting: Orthopedic Surgery

## 2022-05-27 NOTE — Telephone Encounter (Signed)
Leah Lynch w/Dr. Vickey Sages office [phone] called, stated that the pt mentioned that she is going to have surgery and they want to know if she will need surgical clearence or if we need anything from them.

## 2022-06-03 NOTE — Telephone Encounter (Signed)
I've tried to call Dr. Vickey Sages office a couple time, clearance has been received but pt needs to be smoke free for 30 days. Called pt to let her know that Dr. Vickey Sages office called and we've attempted to call back and give them information.

## 2022-07-21 ENCOUNTER — Other Ambulatory Visit (HOSPITAL_COMMUNITY): Payer: Self-pay | Admitting: Family Medicine

## 2022-07-21 DIAGNOSIS — Z1231 Encounter for screening mammogram for malignant neoplasm of breast: Secondary | ICD-10-CM

## 2022-08-05 ENCOUNTER — Ambulatory Visit (INDEPENDENT_AMBULATORY_CARE_PROVIDER_SITE_OTHER): Payer: 59 | Admitting: Orthopedic Surgery

## 2022-08-05 ENCOUNTER — Encounter: Payer: Self-pay | Admitting: Orthopedic Surgery

## 2022-08-05 VITALS — BP 137/66 | HR 64 | Ht 59.0 in | Wt 141.0 lb

## 2022-08-05 DIAGNOSIS — M12812 Other specific arthropathies, not elsewhere classified, left shoulder: Secondary | ICD-10-CM

## 2022-08-05 NOTE — Progress Notes (Signed)
Return patient Visit  Assessment: Leah Lynch is a 77 y.o. female with the following: Left shoulder rotator cuff arthropathy  Plan: Leah Lynch has a large superior rotator cuff tear, with retraction.  She has pain in the left shoulder, limited function.  Based on her ongoing pain and debility, I am recommending surgery, to include a reverse shoulder arthroplasty.  She has not had a cigarette in over a month.  She will continue to refrain from smoking.  We have already obtained medical clearance from Dr. Sudie Bailey.  At this point, we will plan to proceed with a CT scan for preoperative planning.  Once the CT scan has been scheduled, we can proceed with scheduling a date for surgery.  She states understanding.  Risks and benefits of the surgery, including, but not limited to infection, bleeding, persistent pain, need for further surgery, stiffness, instability and more severe complications associated with anesthesia were discussed with the patient.  The patient has elected to proceed.  Patient has been taking hydrocodone for many years.  We have discussed pain management following surgery.  She may have some difficulty with pain control due to chronic narcotic use.   Follow-up: Return for After surgery.  Subjective:  Chief Complaint  Patient presents with   Shoulder Pain    L shoulder, pt states she is now over 30 days tobacco free. Here to discuss moving forward with surgery.     History of Present Illness: Leah Lynch is a 77 y.o. female who returns for repeat evaluation of left shoulder pain.  She is right-hand dominant.  She has had pain in her left shoulder that is progressively worsening.  She has had an MRI, which demonstrates a large tear of the rotator cuff with retraction, as well as atrophy.  At the last visit, we did discuss proceeding with reverse shoulder arthroplasty.  She has obtained medical clearance.  She has not had a cigarette in over a month.  Due  to the pain, and ongoing dysfunction in the left shoulder, she is ready to proceed with surgery.  Review of Systems: No fevers or chills No numbness or tingling No chest pain No shortness of breath No bowel or bladder dysfunction No GI distress No headaches    Objective: BP 137/66   Pulse 64   Ht 4\' 11"  (1.499 m)   Wt 141 lb (64 kg)   BMI 28.48 kg/m   Physical Exam:  General: Alert and oriented. and No acute distress. Gait: Normal gait.  Evaluation left shoulder demonstrates no deformity.  Atrophy of the posterior shoulder muscles.  No redness.  No bruising.  Forward flexion limited to 90 degrees.  She demonstrates pseudoparalysis.  Fingers are warm and well-perfused.  Sensation intact in the axillary nerve distribution.  2+ radial pulse.  Intact sensation throughout left hand.  Active motion of the deltoid muscle, with some pain in the left shoulder.  IMAGING: I personally reviewed images previously obtained in clinic  Left shoulder MRI IMPRESSION: Complete supraspinatus and infraspinatus tendon tears with tendon retraction to the glenohumeral joint and moderate fatty atrophy of the muscle bellies.   Severe subscapularis tendinopathy with a large longitudinal split tear of the tendon present. There is also moderate subscapularis atrophy.   Complete tear of the long head of biceps from the superior labrum.   Moderate acromioclavicular and mild glenohumeral osteoarthritis.   Degenerative tearing of the superior and posterior labrum.  New Medications:  No orders of the defined types were  placed in this encounter.     Oliver Barre, MD  08/05/2022 8:48 AM

## 2022-08-05 NOTE — Patient Instructions (Signed)
Will schedule CT scan for your left shoulder.  Once the CT scan has been scheduled, we can proceed with scheduling a date for surgery.  Typically, we will need about 1 week after the CT scan before we can proceed with surgery.  Surgery will likely be scheduled for Monday.  This will require a 1 night stay in the hospital.

## 2022-08-15 ENCOUNTER — Telehealth: Payer: Self-pay | Admitting: Orthopedic Surgery

## 2022-08-15 ENCOUNTER — Ambulatory Visit (HOSPITAL_COMMUNITY): Payer: 59

## 2022-08-15 NOTE — Telephone Encounter (Signed)
Dr. Dallas Schimke Patient called her CT was suppose to be today and the hospital sent her a message thru [facility] they were rescheduling her appt to 09/01/22.   Anaeli wants to know if Dr. Dallas Schimke can do something about it.  She is in a lot of pain, and she is ready to have her surgery she doesn't want it postponed.  Please call her back at [phone]

## 2022-08-19 NOTE — Telephone Encounter (Signed)
Pt called back and states she is in significant pain and doesn't want her surgery to be postponed again. Asked pt if she would be willing to have her CT done at another facility to try to expedite things, she declined. Asked if she was placed on a waiting list for CT's and she was unsure, I will try to call and get her added to the CT wait list if she is not. Pt advised she's currently taking hydrocodone for arthritis given by her PCP who is about to retire, she will see her new provider next week, and is concerned on pain management during surgery. Let pt know that Dr. Dallas Schimke provides pain management for his pt's surgical needs while necessary, but anything outside of that will be up to her PCP to continue after surgical concerns are taken care of. Pt has no further questions or concerns at this time.

## 2022-08-19 NOTE — Telephone Encounter (Signed)
Called and left VM for a call back.

## 2022-08-22 ENCOUNTER — Ambulatory Visit (HOSPITAL_COMMUNITY)
Admission: RE | Admit: 2022-08-22 | Discharge: 2022-08-22 | Disposition: A | Discharge status: Home / Self Care | Payer: 59 | Source: Ambulatory Visit | Attending: Family Medicine | Admitting: Family Medicine

## 2022-08-22 ENCOUNTER — Ambulatory Visit: Payer: 59 | Admitting: Orthopedic Surgery

## 2022-08-22 ENCOUNTER — Encounter (HOSPITAL_COMMUNITY): Payer: Self-pay

## 2022-08-22 DIAGNOSIS — Z1231 Encounter for screening mammogram for malignant neoplasm of breast: Secondary | ICD-10-CM | POA: Diagnosis not present

## 2022-08-27 ENCOUNTER — Ambulatory Visit (INDEPENDENT_AMBULATORY_CARE_PROVIDER_SITE_OTHER): Payer: 59 | Admitting: Internal Medicine

## 2022-08-27 ENCOUNTER — Encounter: Payer: Self-pay | Admitting: Internal Medicine

## 2022-08-27 VITALS — BP 126/82 | HR 70 | Ht 59.0 in | Wt 142.0 lb

## 2022-08-27 DIAGNOSIS — M7918 Myalgia, other site: Secondary | ICD-10-CM | POA: Diagnosis not present

## 2022-08-27 DIAGNOSIS — M12812 Other specific arthropathies, not elsewhere classified, left shoulder: Secondary | ICD-10-CM

## 2022-08-27 DIAGNOSIS — M1991 Primary osteoarthritis, unspecified site: Secondary | ICD-10-CM

## 2022-08-27 DIAGNOSIS — M810 Age-related osteoporosis without current pathological fracture: Secondary | ICD-10-CM | POA: Diagnosis not present

## 2022-08-27 DIAGNOSIS — Z8739 Personal history of other diseases of the musculoskeletal system and connective tissue: Secondary | ICD-10-CM | POA: Diagnosis not present

## 2022-08-27 DIAGNOSIS — F1190 Opioid use, unspecified, uncomplicated: Secondary | ICD-10-CM

## 2022-08-27 DIAGNOSIS — I10 Essential (primary) hypertension: Secondary | ICD-10-CM

## 2022-08-27 DIAGNOSIS — G8929 Other chronic pain: Secondary | ICD-10-CM

## 2022-08-27 DIAGNOSIS — Z1329 Encounter for screening for other suspected endocrine disorder: Secondary | ICD-10-CM | POA: Diagnosis not present

## 2022-08-27 DIAGNOSIS — E782 Mixed hyperlipidemia: Secondary | ICD-10-CM | POA: Diagnosis not present

## 2022-08-27 DIAGNOSIS — K219 Gastro-esophageal reflux disease without esophagitis: Secondary | ICD-10-CM

## 2022-08-27 DIAGNOSIS — Z139 Encounter for screening, unspecified: Secondary | ICD-10-CM | POA: Diagnosis not present

## 2022-08-27 DIAGNOSIS — Z87891 Personal history of nicotine dependence: Secondary | ICD-10-CM | POA: Diagnosis not present

## 2022-08-27 DIAGNOSIS — Z122 Encounter for screening for malignant neoplasm of respiratory organs: Secondary | ICD-10-CM

## 2022-08-27 NOTE — Progress Notes (Signed)
New Patient Office Visit  Subjective    Patient ID: Leah Lynch, female    DOB: 1945-09-12  Age: 77 y.o. MRN: [id number]  CC:  Chief Complaint  Patient presents with   Establish Care    HPI Leah Lynch presents to establish care.  She is a 77 year old woman who endorses a past medical history significant for diffuse osteoarthritis with chronic musculoskeletal pain, HTN, GERD, HLD, and osteoporosis. Previously followed by Dr. Sudie Bailey.  [name] reports feeling well today.  She reports that she will undergo left rotator cuff surgery with Dr. Dallas Schimke on 6/24.  She endorses chronic musculoskeletal pain but is otherwise asymptomatic and has no acute concerns to discuss today aside from desiring to establish care.  She is currently retired and previously worked for [employer].  She endorses former tobacco use, quitting 1 month ago after accumulating at least a 47-pack-year smoking history.  She denies alcohol and illicit drug use.  Her family medical history is significant for CVA.  Chronic medical conditions and outstanding preventative care items discussed today are individually addressed A/P below.   Outpatient Encounter Medications as of 08/27/2022  Medication Sig   atenolol (TENORMIN) 25 MG tablet Take 25 mg by mouth daily.   atorvastatin (LIPITOR) 40 MG tablet Take 40 mg by mouth daily.    chlorthalidone (HYGROTON) 25 MG tablet Take 25 mg by mouth daily.   gabapentin (NEURONTIN) 300 MG capsule Take 300 mg by mouth 3 (three) times daily as needed (pain).   HYDROcodone-acetaminophen (NORCO) 10-325 MG tablet Take 1 tablet by mouth 4 (four) times daily as needed for moderate pain.   Multiple Vitamin (MULTIVITAMIN) tablet Take 1 tablet by mouth daily. Centrum silver woman+   naproxen (NAPROSYN) 500 MG tablet Take 500 mg by mouth 2 (two) times daily with a meal.   omeprazole (PRILOSEC) 20 MG capsule Take 20 mg by mouth daily.   potassium chloride (KLOR-CON) 10 MEQ CR  tablet Take 10 mEq by mouth 2 (two) times daily.   PROAIR HFA 108 (90 Base) MCG/ACT inhaler Inhale 2 puffs into the lungs every 6 (six) hours as needed for wheezing or shortness of breath.   [DISCONTINUED] B Complex-C (SUPER B COMPLEX PO) Take 2 tablets by mouth daily.    [DISCONTINUED] Cholecalciferol (VITAMIN D3) 50 MCG (2000 UT) capsule Take 2,000 Units by mouth daily.   [DISCONTINUED] Chromium 1000 MCG TABS Take 1,000 mcg by mouth daily.    No facility-administered encounter medications on file as of 08/27/2022.    Past Medical History:  Diagnosis Date   Fibrocystic breast    GERD (gastroesophageal reflux disease)    Hyperlipidemia    Hypertension    Osteoarthritis    Osteoporosis    PONV (postoperative nausea and vomiting)    Pre-diabetes    Shortness of breath dyspnea     Past Surgical History:  Procedure Laterality Date   ABDOMINAL HYSTERECTOMY     BIOPSY  04/15/2018   Procedure: BIOPSY;  Surgeon: Corbin Ade, MD;  [facility];  Service: Endoscopy;;  colon   BREAST CYST EXCISION Right    BREAST SURGERY     removal of cyst from right breast-fibrocystsic   BUNIONECTOMY  12/19/2010   Procedure: BUNIONECTOMY;  Surgeon: Dallas Schimke;  Location: [facility];  Service: Orthopedics;  Laterality: Right;  Leah Lynch, Leah Lynch Bunionectomy   BUNIONECTOMY  05/22/2011   Procedure: Arbutus Leas;  Surgeon: Dallas Schimke, DPM;  Location: [facility];  Service:  Orthopedics;  Laterality: Left;  [name] Bunionectomy Left Foot   CAPSULOTOMY  01/29/2012   Procedure: CAPSULOTOMY;  Surgeon: Dallas Schimke, DPM;  Location: [facility];  Service: Orthopedics;  Laterality: Left;   CATARACT EXTRACTION W/PHACO Right 08/09/2015   Procedure: CATARACT EXTRACTION PHACO AND INTRAOCULAR LENS PLACEMENT RIGHT EYE CDE=7.95;  Surgeon: Leah Payor, MD;  Location: [facility];  Service: Ophthalmology;  Laterality: Right;   CATARACT EXTRACTION W/PHACO Left 08/27/2015   Procedure:  CATARACT EXTRACTION PHACO AND INTRAOCULAR LENS PLACEMENT LEFT EYE; CDE:  6.12;  Surgeon: Leah Payor, MD;  Location: [facility];  Service: Ophthalmology;  Laterality: Left;   COLONOSCOPY  2012   Dr. Jena Lynch: internal hemorrhoids, hyperplastic polyps. Surveillance 2022   COLONOSCOPY WITH PROPOFOL N/A 04/15/2018   one 7 mm polyp at splenic flexure, one 4 mm polyp in rectum, otherwise normal. S/p segmental biopsy:normal. Normal TI   DIAGNOSTIC LAPAROSCOPY     ESOPHAGOGASTRODUODENOSCOPY (EGD) WITH PROPOFOL N/A 04/15/2018   normal   FOOT GANGLION EXCISION     right foot   METATARSAL OSTEOTOMY  05/22/2011   Procedure: METATARSAL OSTEOTOMY;  Surgeon: Dallas Schimke, DPM;  Location: [facility];  Service: Orthopedics;  Laterality: Left;  Leah Lynch Osteotomy Left Foot   POLYPECTOMY  04/15/2018   Procedure: POLYPECTOMY;  Surgeon: Corbin Ade, MD;  [facility];  Service: Endoscopy;;  colon   TONSILLECTOMY  age 32   TRIGGER FINGER RELEASE Right 01/28/2018   Procedure: RIGHT TRIGGER THUMB RELEASE;  Surgeon: Leah Hearing, MD;  Location: [facility];  Service: Orthopedics;  Laterality: Right;   TUBAL LIGATION      Family History  Problem Relation Age of Onset   Anesthesia problems Neg Hx    Hypotension Neg Hx    Malignant hyperthermia Neg Hx    Pseudochol deficiency Neg Hx    Colon cancer Neg Hx     Social History   Socioeconomic History   Marital status: Divorced    Spouse name: Not on file   Number of children: Not on file   Years of education: Not on file   Highest education level: Not on file  Occupational History   Not on file  Tobacco Use   Smoking status: Former    Packs/day: 1.00    Years: 47.00    Additional pack years: 0.00    Total pack years: 47.00    Types: Cigarettes    Start date: 03/18/1975    Quit date: 07/27/2022    Years since quitting: 0.1   Smokeless tobacco: Never  Substance and Sexual Activity   Alcohol use: No   Drug use: No   Sexual activity: Yes     Birth control/protection: Post-menopausal  Other Topics Concern   Not on file  Social History Narrative   Not on file   Social Determinants of Health   Financial Resource Strain: Not on file  Food Insecurity: Not on file  Transportation Needs: Not on file  Physical Activity: Not on file  Stress: Not on file  Social Connections: Not on file  Intimate Partner Violence: Not on file   Review of Systems  Constitutional:  Negative for chills and fever.  HENT:  Negative for sore throat.   Respiratory:  Negative for cough and shortness of breath.   Cardiovascular:  Negative for chest pain, palpitations and leg swelling.  Gastrointestinal:  Negative for abdominal pain, blood in stool, constipation, diarrhea, nausea and vomiting.  Genitourinary:  Negative for dysuria and hematuria.  Musculoskeletal:  Negative for myalgias.  Skin:  Negative for itching and rash.  Neurological:  Negative for dizziness and headaches.  Psychiatric/Behavioral:  Negative for depression and suicidal ideas.    Objective    BP 126/82   Pulse 70   Ht 4\' 11"  (1.499 m)   Wt 142 lb (64.4 kg)   SpO2 94%   BMI 28.68 kg/m   Physical Exam Vitals reviewed.  Constitutional:      General: She is not in acute distress.    Appearance: Normal appearance. She is not toxic-appearing.  HENT:     Head: Normocephalic and atraumatic.     Right Ear: External ear normal.     Left Ear: External ear normal.     Nose: Nose normal. No congestion or rhinorrhea.     Mouth/Throat:     Mouth: Mucous membranes are moist.     Pharynx: Oropharynx is clear. No oropharyngeal exudate or posterior oropharyngeal erythema.  Eyes:     General: No scleral icterus.    Extraocular Movements: Extraocular movements intact.     Conjunctiva/sclera: Conjunctivae normal.     Pupils: Pupils are equal, round, and reactive to light.  Cardiovascular:     Rate and Rhythm: Normal rate and regular rhythm.     Pulses: Normal pulses.     Heart  sounds: Normal heart sounds. No murmur heard.    No friction rub. No gallop.  Pulmonary:     Effort: Pulmonary effort is normal.     Breath sounds: Normal breath sounds. No wheezing, rhonchi or rales.  Abdominal:     General: Abdomen is flat. Bowel sounds are normal. There is no distension.     Palpations: Abdomen is soft.     Tenderness: There is no abdominal tenderness.  Musculoskeletal:        General: No swelling.     Cervical back: Normal range of motion.     Right lower leg: No edema.     Left lower leg: No edema.  Lymphadenopathy:     Cervical: No cervical adenopathy.  Skin:    General: Skin is warm and dry.     Capillary Refill: Capillary refill takes less than 2 seconds.     Coloration: Skin is not jaundiced.  Neurological:     General: No focal deficit present.     Mental Status: She is alert and oriented to person, place, and time.  Psychiatric:        Mood and Affect: Mood normal.        Behavior: Behavior normal.    Assessment & Plan:   Problem List Items Addressed This Visit       Essential hypertension - Primary    She is currently prescribed atenolol 25 mg daily and chlorthalidone 25 mg daily for treatment of hypertension.  BP today is 126/82.      GERD (gastroesophageal reflux disease)    Symptoms are adequately controlled with omeprazole 20 mg daily.      Osteoporosis    History of osteoporosis.  DEXA last updated in June 2023 with T-score -2.3.  Previously on vitamin D and calcium supplementation but is not taking any supplementation currently. -I recommended that she resume vitamin D and calcium supplementation -Repeat labs ordered today      Rotator cuff arthropathy, left    She will undergo left reverse shoulder arthroplasty with Dr. Dallas Schimke on 6/24.      Hyperlipidemia    She is currently prescribed atorvastatin 40 mg  daily.  Repeat lipid panel ordered today.      Chronic musculoskeletal pain    She endorses a history of chronic  musculoskeletal pain secondary to diffuse osteoarthritis.  She is currently prescribed hydrocodone 10-325 mg as needed 3-4 times daily, gabapentin 300 mg 3-4 times daily, and naproxen 500 mg twice daily. -Pain management referral placed in setting of chronic opioid use as well as to discuss additional treatment options      Screening for lung cancer    Patient endorses former tobacco use, quitting 1 month ago, and accumulating a 47-pack-year smoking history.  She is in agreement with a referral for lung cancer screening. -Lung cancer screening referral placed today      Return in about 3 months (around 11/27/2022).   Billie Lade, MD

## 2022-08-27 NOTE — Patient Instructions (Signed)
It was a pleasure to see you today.  Thank you for giving [place] the opportunity to be involved in your care.  Below is a brief recap of your visit and next steps.  We will plan to see you again in 3 months.  Summary You have established care today Referrals to pain management and lung cancer screening have been placed Repeat labs ordered Follow up in 3 months

## 2022-08-28 ENCOUNTER — Telehealth: Payer: Self-pay | Admitting: Internal Medicine

## 2022-08-28 NOTE — Telephone Encounter (Signed)
Patient called said set up for a CT scan she asked not to be setup in [place], asking please change to [place] at [facility] call back # [phone]

## 2022-08-29 LAB — CMP14+EGFR
ALT: 23 IU/L (ref 0–32)
AST: 26 IU/L (ref 0–40)
Albumin/Globulin Ratio: 1.9
Albumin: 4.3 g/dL (ref 3.8–4.8)
Alkaline Phosphatase: 125 IU/L — ABNORMAL HIGH (ref 44–121)
BUN/Creatinine Ratio: 27 (ref 12–28)
BUN: 20 mg/dL (ref 8–27)
Bilirubin Total: 0.2 mg/dL (ref 0.0–1.2)
CO2: 29 mmol/L (ref 20–29)
Calcium: 9.5 mg/dL (ref 8.7–10.3)
Chloride: 104 mmol/L (ref 96–106)
Creatinine, Ser: 0.74 mg/dL (ref 0.57–1.00)
Globulin, Total: 2.3 g/dL (ref 1.5–4.5)
Glucose: 108 mg/dL — ABNORMAL HIGH (ref 70–99)
Potassium: 3.9 mmol/L (ref 3.5–5.2)
Sodium: 146 mmol/L — ABNORMAL HIGH (ref 134–144)
Total Protein: 6.6 g/dL (ref 6.0–8.5)
eGFR: 83 mL/min/{1.73_m2} (ref >59)

## 2022-08-29 LAB — LIPID PANEL
Chol/HDL Ratio: 2.5 ratio (ref 0.0–4.4)
Cholesterol, Total: 189 mg/dL (ref 100–199)
HDL: 76 mg/dL (ref >39)
LDL Chol Calc (NIH): 87 mg/dL (ref 0–99)
Triglycerides: 152 mg/dL — ABNORMAL HIGH (ref 0–149)
VLDL Cholesterol Cal: 26 mg/dL (ref 5–40)

## 2022-08-29 LAB — HEMOGLOBIN A1C
Est. average glucose Bld gHb Est-mCnc: 146 mg/dL
Hgb A1c MFr Bld: 6.7 % — ABNORMAL HIGH (ref 4.8–5.6)

## 2022-08-29 LAB — CBC WITH DIFFERENTIAL/PLATELET
Basophils Absolute: 0 10*3/uL (ref 0.0–0.2)
Basos: 0 %
EOS (ABSOLUTE): 0.6 10*3/uL — ABNORMAL HIGH (ref 0.0–0.4)
Eos: 6 %
Hematocrit: 38.4 % (ref 34.0–46.6)
Hemoglobin: 12.3 g/dL (ref 11.1–15.9)
Immature Grans (Abs): 0 10*3/uL (ref 0.0–0.1)
Immature Granulocytes: 0 %
Lymphocytes Absolute: 4.1 10*3/uL — ABNORMAL HIGH (ref 0.7–3.1)
Lymphs: 41 %
MCH: 28.1 pg (ref 26.6–33.0)
MCHC: 32 g/dL (ref 31.5–35.7)
MCV: 88 fL (ref 79–97)
Monocytes Absolute: 0.6 10*3/uL (ref 0.1–0.9)
Monocytes: 6 %
Neutrophils Absolute: 4.7 10*3/uL (ref 1.4–7.0)
Neutrophils: 47 %
Platelets: 269 10*3/uL (ref 150–450)
RBC: 4.38 x10E6/uL (ref 3.77–5.28)
RDW: 13.3 % (ref 11.7–15.4)
WBC: 10 10*3/uL (ref 3.4–10.8)

## 2022-08-29 LAB — VITAMIN D 25 HYDROXY (VIT D DEFICIENCY, FRACTURES): Vit D, 25-Hydroxy: 34.2 ng/mL (ref 30.0–100.0)

## 2022-08-29 LAB — B12 AND FOLATE PANEL
Folate: 16.5 ng/mL (ref >3.0)
Vitamin B-12: 852 pg/mL (ref 232–1245)

## 2022-08-29 LAB — TSH+FREE T4
Free T4: 0.87 ng/dL (ref 0.82–1.77)
TSH: 5.08 u[IU]/mL — ABNORMAL HIGH (ref 0.450–4.500)

## 2022-09-01 ENCOUNTER — Ambulatory Visit: Payer: Self-pay | Admitting: Orthopedic Surgery

## 2022-09-01 ENCOUNTER — Ambulatory Visit (HOSPITAL_COMMUNITY)
Admission: RE | Admit: 2022-09-01 | Discharge: 2022-09-01 | Disposition: A | Discharge status: Home / Self Care | Payer: 59 | Source: Ambulatory Visit | Attending: Orthopedic Surgery | Admitting: Orthopedic Surgery

## 2022-09-01 DIAGNOSIS — M12812 Other specific arthropathies, not elsewhere classified, left shoulder: Secondary | ICD-10-CM | POA: Insufficient documentation

## 2022-09-01 DIAGNOSIS — M19012 Primary osteoarthritis, left shoulder: Secondary | ICD-10-CM | POA: Diagnosis not present

## 2022-09-01 NOTE — Patient Instructions (Signed)
Leah Lynch  09/01/2022     @PREFPERIOPPHARMACY @   Your procedure is scheduled on  09/08/2022.   Report to [facility] at  0600 A.M.   Call this number if you have problems the morning of surgery:  [phone]  If you experience any cold or flu symptoms such as cough, fever, chills, shortness of breath, etc. between now and your scheduled surgery, please notify us at the above number.     Leah Lynch  09/01/2022     @PREFPERIOPPHARMACY @   Your procedure is scheduled on  09/08/2022.   Report to [facility] at  0600 A.M.  Call this number if you have problems the morning of surgery:  [phone]  If you experience any cold or flu symptoms such as cough, fever, chills, shortness of breath, etc. between now and your scheduled surgery, please notify us at the above number.   Remember:  Do not eat or drink after midnight.  You may drink clear liquids until  0330 am on 09/08/2022.      Clear liquids allowed are:                    Water, Juice (No red color; non-citric and without pulp; diabetics please choose diet or no sugar options), Carbonated beverages (diabetics please choose diet or no sugar options), Clear Tea (No creamer, milk, or cream, including half & half and powdered creamer), Black Coffee Only (No creamer, milk or cream, including half & half and powdered creamer), Plain Jell-O Only (No red color; diabetics please choose no sugar options), Clear Sports drink (No red color; diabetics please choose diet or no sugar options), and Plain Popsicles Only (No red color; diabetics please choose no sugar options)       At 0330 am on 09/08/2022, drink your carb drink. You can have nothing else to drink after this.      Use your inhaler before you come and bring your rescue inhaler with you.     Take these medicines the morning of surgery with A SIP OF WATER       atenolol, gabapentin, hydrocodone (if needed), omeprazole.     Do not wear jewelry, make-up  or nail polish, including gel polish,  artificial nails, or any other type of covering on natural nails (fingers and  toes).  Do not wear lotions, powders, or perfumes, or deodorant.  Do not shave 48 hours prior to surgery.  Men may shave face and neck.  Do not bring valuables to the hospital.  [facility] is not responsible for any belongings or valuables.  Contacts, dentures or bridgework may not be worn into surgery.  Leave your suitcase in the car.  After surgery it may be brought to your room.  For patients admitted to the hospital, discharge time will be determined by your treatment team.  Patients discharged the day of surgery will not be allowed to drive home and must have someone with them for 24 hours.    Special instructions:   DO NOT smoke tobacco or vape for 24 hours before your procedure.  Please read over the following fact sheets that you were given. Pain Booklet, Coughing and Deep Breathing, Surgical Site Infection Prevention, Anesthesia Post-op Instructions, and Care and Recovery After Surgery            Reverse Total Shoulder Replacement, Care After This sheet gives you information about how to care for yourself after your procedure. Your health  care provider may also give you more specific instructions. If you have problems or questions, contact your health care provider. What can I expect after the procedure? After the procedure, it is common to have: Pain in the shoulder and arm. Stiffness in the shoulder and arm. Follow these instructions at home: Medicines Take over-the-counter and prescription medicines only as told by your health care provider. Ask your health care provider if the medicine prescribed to you: Requires you to avoid driving or using machinery. Can cause constipation. You may need to take these actions to prevent or treat constipation: Drink enough fluid to keep your urine pale yellow. Take over-the-counter or prescription medicines. Eat  foods that are high in fiber, such as beans, whole grains, and fresh fruits and vegetables. Limit foods that are high in fat and processed sugars, such as fried or sweet foods. If you have a sling: Wear the sling as told by your health care provider. Remove it only as told by your health care provider. Check the skin around your sling every day. Tell your health care provider about any concerns. Loosen the sling if your fingers tingle, become numb, or turn cold and blue. Keep the sling clean and dry. Bathing Do not take baths, swim, or use a hot tub until your health care provider approves. Ask your health care provider if you may take showers. You may only be allowed to take sponge baths. If the sling is not waterproof: Do not let it get wet. Cover it with a watertight covering when you take a bath or shower. Keep your bandage (dressing) dry until your health care provider says it can be removed. Incision care  Follow instructions from your health care provider about how to take care of your incision. Make sure you: Wash your hands with soap and water for at least 20 seconds before and after you change your dressing. If soap and water are not available, use hand sanitizer. Change your dressing as told by your health care provider. Leave stitches (sutures), skin glue, or adhesive strips in place. These skin closures may need to stay in place for 2 weeks or longer. If adhesive strip edges start to loosen and curl up, you may trim the loose edges. Do not remove adhesive strips completely unless your health care provider tells you to do that. Check your incision area every day for signs of infection. Check for: More redness, swelling, or pain. Fluid or blood. Warmth. Pus or a bad smell. Managing pain, stiffness, and swelling  If directed, put ice on your shoulder. To do this: If you have a removable sling, remove it as told by your health care provider. Put ice in a plastic bag. Place a  towel between your skin and the bag. Leave the ice on for 20 minutes, 2-3 times a day. Remove the ice if your skin turns bright red. This is very important. If you cannot feel pain, heat, or cold, you have a greater risk of damage to the area. Move your fingers and hand often to reduce stiffness and swelling. Driving If you were given a sedative during the procedure, it can affect you for several hours. Do not drive or operate machinery until your health care provider says that it is safe. Ask your health care provider when it is safe to drive if you have a sling on your arm. Activity Return to your normal activities as told by your health care provider. Ask your health care provider what activities are  safe for you. Do shoulder exercises as told by your health care provider. Do not lift your arm above shoulder level until your health care provider approves. Do not make large movements with your arm. Do not push or pull things until your health care provider approves. Do not lift anything that is heavier than 5 lb (2.3 kg) until your health care provider says that it is safe. General instructions Do not use any products that contain nicotine or tobacco, such as cigarettes, e-cigarettes, and chewing tobacco. These can delay healing after surgery. If you need help quitting, ask your health care provider. Tell your health care provider if you plan to have dental work. Also: Tell your dentist about your joint replacement. Ask your health care provider if there are any special instructions you need to follow before having dental care and routine cleanings. Keep all follow-up visits. This is important. Contact a health care provider if: You feel nauseous or you vomit. Your arm tingles or feels numb. Your pain gets worse, even after taking pain medicine. You have any of these signs of infection: More redness, swelling, or pain around your incision. Fluid or blood coming from your incision. Warmth  coming from your incision. Pus or a bad smell coming from your incision. A fever. Get help right away if: Your shoulder joint moves out of place. Your incision comes apart. You have redness, swelling, pain, or warmth in your leg or arm. You have chest pain or shortness of breath. These symptoms may represent a serious problem that is an emergency. Do not wait to see if the symptoms will go away. Get medical help right away. Call your local emergency services (911 in the [place] Do not drive yourself to the hospital. Summary After the procedure, it is common to have some pain and stiffness. Take over-the-counter and prescription medicines only as told by your health care provider. Keep your bandage (dressing) dry until your health care provider says it can be removed. Know the symptoms that should prompt you to contact your health care provider. Do shoulder exercises as told by your health care provider. Ask your health care provider what activities are safe for you. This information is not intended to replace advice given to you by your health care provider. Make sure you discuss any questions you have with your health care provider. Document Revised: 08/17/2019 Document Reviewed: 08/17/2019 Elsevier Patient Education  2024 [employer] General Anesthesia, Adult, Care After The following information offers guidance on how to care for yourself after your procedure. Your health care provider may also give you more specific instructions. If you have problems or questions, contact your health care provider. What can I expect after the procedure? After the procedure, it is common for people to: Have pain or discomfort at the IV site. Have nausea or vomiting. Have a sore throat or hoarseness. Have trouble concentrating. Feel cold or chills. Feel weak, sleepy, or tired (fatigue). Have soreness and body aches. These can affect parts of the body that were not involved in surgery. Follow these  instructions at home: For the time period you were told by your health care provider:  Rest. Do not participate in activities where you could fall or become injured. Do not drive or use machinery. Do not drink alcohol. Do not take sleeping pills or medicines that cause drowsiness. Do not make important decisions or sign legal documents. Do not take care of children on your own. General instructions Drink enough fluid to keep your  urine pale yellow. If you have sleep apnea, surgery and certain medicines can increase your risk for breathing problems. Follow instructions from your health care provider about wearing your sleep device: Anytime you are sleeping, including during daytime naps. While taking prescription pain medicines, sleeping medicines, or medicines that make you drowsy. Return to your normal activities as told by your health care provider. Ask your health care provider what activities are safe for you. Take over-the-counter and prescription medicines only as told by your health care provider. Do not use any products that contain nicotine or tobacco. These products include cigarettes, chewing tobacco, and vaping devices, such as e-cigarettes. These can delay incision healing after surgery. If you need help quitting, ask your health care provider. Contact a health care provider if: You have nausea or vomiting that does not get better with medicine. You vomit every time you eat or drink. You have pain that does not get better with medicine. You cannot urinate or have bloody urine. You develop a skin rash. You have a fever. Get help right away if: You have trouble breathing. You have chest pain. You vomit blood. These symptoms may be an emergency. Get help right away. Call 911. Do not wait to see if the symptoms will go away. Do not drive yourself to the hospital. Summary After the procedure, it is common to have a sore throat, hoarseness, nausea, vomiting, or to feel weak,  sleepy, or fatigue. For the time period you were told by your health care provider, do not drive or use machinery. Get help right away if you have difficulty breathing, have chest pain, or vomit blood. These symptoms may be an emergency. This information is not intended to replace advice given to you by your health care provider. Make sure you discuss any questions you have with your health care provider. Document Revised: 05/31/2021 Document Reviewed: 05/31/2021 Elsevier Patient Education  2024 [employer].

## 2022-09-02 ENCOUNTER — Encounter (HOSPITAL_COMMUNITY): Payer: Self-pay

## 2022-09-02 ENCOUNTER — Encounter (HOSPITAL_COMMUNITY)
Admission: RE | Admit: 2022-09-02 | Discharge: 2022-09-02 | Disposition: A | Discharge status: Home / Self Care | Payer: 59 | Source: Ambulatory Visit | Attending: Orthopedic Surgery | Admitting: Orthopedic Surgery

## 2022-09-02 VITALS — BP 126/82 | HR 70 | Temp 97.6°F | Resp 18 | Ht 59.0 in | Wt 142.0 lb

## 2022-09-02 DIAGNOSIS — Z01818 Encounter for other preprocedural examination: Secondary | ICD-10-CM | POA: Diagnosis not present

## 2022-09-02 DIAGNOSIS — I10 Essential (primary) hypertension: Secondary | ICD-10-CM | POA: Insufficient documentation

## 2022-09-02 HISTORY — DX: Prediabetes: R73.03

## 2022-09-02 HISTORY — DX: Nausea with vomiting, unspecified: Z98.890

## 2022-09-02 HISTORY — DX: Other specified postprocedural states: R11.2

## 2022-09-02 LAB — SURGICAL PCR SCREEN
MRSA, PCR: NEGATIVE
Staphylococcus aureus: NEGATIVE

## 2022-09-03 DIAGNOSIS — M12812 Other specific arthropathies, not elsewhere classified, left shoulder: Secondary | ICD-10-CM | POA: Insufficient documentation

## 2022-09-03 DIAGNOSIS — Z122 Encounter for screening for malignant neoplasm of respiratory organs: Secondary | ICD-10-CM | POA: Insufficient documentation

## 2022-09-03 DIAGNOSIS — I10 Essential (primary) hypertension: Secondary | ICD-10-CM | POA: Insufficient documentation

## 2022-09-03 DIAGNOSIS — E785 Hyperlipidemia, unspecified: Secondary | ICD-10-CM | POA: Insufficient documentation

## 2022-09-03 DIAGNOSIS — M810 Age-related osteoporosis without current pathological fracture: Secondary | ICD-10-CM | POA: Insufficient documentation

## 2022-09-03 DIAGNOSIS — G8929 Other chronic pain: Secondary | ICD-10-CM | POA: Insufficient documentation

## 2022-09-03 DIAGNOSIS — K219 Gastro-esophageal reflux disease without esophagitis: Secondary | ICD-10-CM | POA: Insufficient documentation

## 2022-09-03 NOTE — Assessment & Plan Note (Signed)
She will undergo left reverse shoulder arthroplasty with Dr. Dallas Schimke on 6/24.

## 2022-09-03 NOTE — Assessment & Plan Note (Signed)
She is currently prescribed atenolol 25 mg daily and chlorthalidone 25 mg daily for treatment of hypertension.  BP today is 126/82.

## 2022-09-03 NOTE — Assessment & Plan Note (Signed)
Symptoms are adequately controlled with omeprazole 20 mg daily.

## 2022-09-03 NOTE — Assessment & Plan Note (Signed)
She is currently prescribed atorvastatin 40 mg daily.  Repeat lipid panel ordered today. 

## 2022-09-03 NOTE — Assessment & Plan Note (Signed)
Patient endorses former tobacco use, quitting 1 month ago, and accumulating a 47-pack-year smoking history.  She is in agreement with a referral for lung cancer screening. -Lung cancer screening referral placed today

## 2022-09-03 NOTE — Assessment & Plan Note (Signed)
History of osteoporosis.  DEXA last updated in June 2023 with T-score -2.3.  Previously on vitamin D and calcium supplementation but is not taking any supplementation currently. -I recommended that she resume vitamin D and calcium supplementation -Repeat labs ordered today

## 2022-09-03 NOTE — Assessment & Plan Note (Signed)
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  She is currently prescribed hydrocodone 10-325 mg as needed 3-4 times daily, gabapentin 300 mg 3-4 times daily, and naproxen 500 mg twice daily. -Pain management referral placed in setting of chronic opioid use as well as to discuss additional treatment options

## 2022-09-08 ENCOUNTER — Encounter (HOSPITAL_COMMUNITY)
Admission: RE | Disposition: A | Discharge status: Home / Self Care | Payer: Self-pay | Source: Home / Self Care | Attending: Orthopedic Surgery

## 2022-09-08 ENCOUNTER — Ambulatory Visit (HOSPITAL_COMMUNITY): Payer: 59 | Admitting: Registered Nurse

## 2022-09-08 ENCOUNTER — Observation Stay (HOSPITAL_COMMUNITY): Payer: 59

## 2022-09-08 ENCOUNTER — Ambulatory Visit (HOSPITAL_BASED_OUTPATIENT_CLINIC_OR_DEPARTMENT_OTHER): Payer: 59 | Admitting: Registered Nurse

## 2022-09-08 ENCOUNTER — Other Ambulatory Visit: Payer: Self-pay

## 2022-09-08 ENCOUNTER — Encounter (HOSPITAL_COMMUNITY): Payer: Self-pay | Admitting: Orthopedic Surgery

## 2022-09-08 ENCOUNTER — Observation Stay (HOSPITAL_COMMUNITY)
Admission: RE | Admit: 2022-09-08 | Discharge: 2022-09-09 | Disposition: A | Discharge status: Home / Self Care | Payer: 59 | Attending: Orthopedic Surgery | Admitting: Orthopedic Surgery

## 2022-09-08 DIAGNOSIS — M12812 Other specific arthropathies, not elsewhere classified, left shoulder: Secondary | ICD-10-CM | POA: Diagnosis not present

## 2022-09-08 DIAGNOSIS — M19012 Primary osteoarthritis, left shoulder: Secondary | ICD-10-CM | POA: Diagnosis not present

## 2022-09-08 DIAGNOSIS — R7309 Other abnormal glucose: Secondary | ICD-10-CM | POA: Diagnosis not present

## 2022-09-08 DIAGNOSIS — M75102 Unspecified rotator cuff tear or rupture of left shoulder, not specified as traumatic: Secondary | ICD-10-CM | POA: Diagnosis not present

## 2022-09-08 DIAGNOSIS — Z87891 Personal history of nicotine dependence: Secondary | ICD-10-CM | POA: Diagnosis not present

## 2022-09-08 DIAGNOSIS — I10 Essential (primary) hypertension: Secondary | ICD-10-CM | POA: Insufficient documentation

## 2022-09-08 HISTORY — PX: REVERSE SHOULDER ARTHROPLASTY: SHX5054

## 2022-09-08 LAB — GLUCOSE, CAPILLARY: Glucose-Capillary: 73 mg/dL (ref 70–99)

## 2022-09-08 LAB — HEMOGLOBIN AND HEMATOCRIT, BLOOD
HCT: 37.3 % (ref 36.0–46.0)
Hemoglobin: 11.7 g/dL — ABNORMAL LOW (ref 12.0–15.0)

## 2022-09-08 SURGERY — ARTHROPLASTY, SHOULDER, TOTAL, REVERSE
Anesthesia: General | Site: Shoulder | Laterality: Left

## 2022-09-08 MED ORDER — EPHEDRINE 5 MG/ML INJ
INTRAVENOUS | Status: AC
  Filled 2022-09-08: qty 20

## 2022-09-08 MED ORDER — DEXTROSE 5 % IV SOLN
INTRAVENOUS | Status: DC | PRN
  Administered 2022-09-08: 2 g via INTRAVENOUS

## 2022-09-08 MED ORDER — LACTATED RINGERS IV SOLN: INTRAVENOUS | Status: DC | PRN

## 2022-09-08 MED ORDER — ASPIRIN 81 MG PO CHEW
81.0000 mg | CHEWABLE_TABLET | Freq: Two times a day (BID) | ORAL | Status: DC
  Administered 2022-09-09: 81 mg via ORAL
  Filled 2022-09-08: qty 1

## 2022-09-08 MED ORDER — EPHEDRINE 5 MG/ML INJ
INTRAVENOUS | Status: AC
  Filled 2022-09-08: qty 5

## 2022-09-08 MED ORDER — DEXMEDETOMIDINE HCL IN NACL 80 MCG/20ML IV SOLN
INTRAVENOUS | Status: AC
  Filled 2022-09-08: qty 20

## 2022-09-08 MED ORDER — ROCURONIUM BROMIDE 10 MG/ML (PF) SYRINGE
PREFILLED_SYRINGE | INTRAVENOUS | Status: DC | PRN
  Administered 2022-09-08: 70 mg via INTRAVENOUS

## 2022-09-08 MED ORDER — NAPROXEN 250 MG PO TABS
375.0000 mg | ORAL_TABLET | Freq: Two times a day (BID) | ORAL | Status: DC
  Administered 2022-09-08 – 2022-09-09 (×2): 375 mg via ORAL
  Filled 2022-09-08 (×2): qty 2

## 2022-09-08 MED ORDER — GLYCOPYRROLATE PF 0.2 MG/ML IJ SOSY
PREFILLED_SYRINGE | INTRAMUSCULAR | Status: AC
  Filled 2022-09-08: qty 1

## 2022-09-08 MED ORDER — SODIUM CHLORIDE 0.9 % IR SOLN
Status: DC | PRN
  Administered 2022-09-08: 3000 mL

## 2022-09-08 MED ORDER — ALBUTEROL SULFATE (2.5 MG/3ML) 0.083% IN NEBU: 3.0000 mL | INHALATION_SOLUTION | Freq: Four times a day (QID) | RESPIRATORY_TRACT | Status: DC | PRN

## 2022-09-08 MED ORDER — ROPIVACAINE HCL 5 MG/ML IJ SOLN
INTRAMUSCULAR | Status: AC
  Filled 2022-09-08: qty 30

## 2022-09-08 MED ORDER — EPHEDRINE SULFATE (PRESSORS) 50 MG/ML IJ SOLN
INTRAMUSCULAR | Status: DC | PRN
  Administered 2022-09-08 (×4): 5 mg via INTRAVENOUS

## 2022-09-08 MED ORDER — ATORVASTATIN CALCIUM 40 MG PO TABS
40.0000 mg | ORAL_TABLET | Freq: Every day | ORAL | Status: DC
  Administered 2022-09-08 – 2022-09-09 (×2): 40 mg via ORAL
  Filled 2022-09-08 (×2): qty 1

## 2022-09-08 MED ORDER — LACTATED RINGERS IV SOLN: INTRAVENOUS | Status: DC

## 2022-09-08 MED ORDER — PHENYLEPHRINE HCL (PRESSORS) 10 MG/ML IV SOLN
INTRAVENOUS | Status: DC | PRN
  Administered 2022-09-08 (×3): 80 ug via INTRAVENOUS

## 2022-09-08 MED ORDER — OXYCODONE HCL 5 MG PO TABS
5.0000 mg | ORAL_TABLET | ORAL | Status: DC | PRN
  Administered 2022-09-09: 5 mg via ORAL
  Filled 2022-09-08: qty 1

## 2022-09-08 MED ORDER — ADULT MULTIVITAMIN W/MINERALS CH
1.0000 | ORAL_TABLET | Freq: Every day | ORAL | Status: DC
  Administered 2022-09-09: 1 via ORAL
  Filled 2022-09-08: qty 1

## 2022-09-08 MED ORDER — CHLORHEXIDINE GLUCONATE 0.12 % MT SOLN
15.0000 mL | Freq: Once | OROMUCOSAL | Status: AC
  Administered 2022-09-08: 15 mL via OROMUCOSAL
  Filled 2022-09-08: qty 15

## 2022-09-08 MED ORDER — OXYCODONE HCL 5 MG/5ML PO SOLN: 5.0000 mg | Freq: Once | ORAL | Status: DC | PRN

## 2022-09-08 MED ORDER — DIPHENHYDRAMINE HCL 12.5 MG/5ML PO ELIX: 12.5000 mg | ORAL_SOLUTION | ORAL | Status: DC | PRN

## 2022-09-08 MED ORDER — ONDANSETRON HCL 4 MG/2ML IJ SOLN
INTRAMUSCULAR | Status: AC
  Filled 2022-09-08: qty 2

## 2022-09-08 MED ORDER — PHENYLEPHRINE HCL (PRESSORS) 10 MG/ML IV SOLN
INTRAVENOUS | Status: AC
  Filled 2022-09-08: qty 1

## 2022-09-08 MED ORDER — PHENYLEPHRINE 80 MCG/ML (10ML) SYRINGE FOR IV PUSH (FOR BLOOD PRESSURE SUPPORT)
PREFILLED_SYRINGE | INTRAVENOUS | Status: AC
  Filled 2022-09-08: qty 30

## 2022-09-08 MED ORDER — DEXAMETHASONE SODIUM PHOSPHATE 10 MG/ML IJ SOLN
INTRAMUSCULAR | Status: AC
  Filled 2022-09-08: qty 1

## 2022-09-08 MED ORDER — HYDROMORPHONE HCL 1 MG/ML IJ SOLN
0.5000 mg | INTRAMUSCULAR | Status: DC | PRN
  Administered 2022-09-08: 0.5 mg via INTRAVENOUS
  Filled 2022-09-08: qty 0.5

## 2022-09-08 MED ORDER — CEFAZOLIN SODIUM-DEXTROSE 2-4 GM/100ML-% IV SOLN
INTRAVENOUS | Status: AC
  Filled 2022-09-08: qty 100

## 2022-09-08 MED ORDER — NITROGLYCERIN 0.4 MG SL SUBL: 0.4000 mg | SUBLINGUAL_TABLET | SUBLINGUAL | Status: DC | PRN

## 2022-09-08 MED ORDER — ROCURONIUM BROMIDE 10 MG/ML (PF) SYRINGE
PREFILLED_SYRINGE | INTRAVENOUS | Status: AC
  Filled 2022-09-08: qty 10

## 2022-09-08 MED ORDER — FUROSEMIDE 40 MG PO TABS
40.0000 mg | ORAL_TABLET | Freq: Every day | ORAL | Status: DC
  Administered 2022-09-08 – 2022-09-09 (×2): 40 mg via ORAL
  Filled 2022-09-08 (×2): qty 1

## 2022-09-08 MED ORDER — GABAPENTIN 300 MG PO CAPS: 300.0000 mg | ORAL_CAPSULE | Freq: Three times a day (TID) | ORAL | Status: DC | PRN

## 2022-09-08 MED ORDER — ONDANSETRON HCL 4 MG/2ML IJ SOLN: 4.0000 mg | Freq: Four times a day (QID) | INTRAMUSCULAR | Status: DC | PRN

## 2022-09-08 MED ORDER — LIDOCAINE HCL (CARDIAC) PF 100 MG/5ML IV SOSY
PREFILLED_SYRINGE | INTRAVENOUS | Status: DC | PRN
  Administered 2022-09-08: 60 mg via INTRAVENOUS

## 2022-09-08 MED ORDER — DEXAMETHASONE SODIUM PHOSPHATE 10 MG/ML IJ SOLN
INTRAMUSCULAR | Status: DC | PRN
  Administered 2022-09-08: 10 mg via INTRAVENOUS

## 2022-09-08 MED ORDER — PHENYLEPHRINE HCL-NACL 20-0.9 MG/250ML-% IV SOLN
INTRAVENOUS | Status: DC | PRN
  Administered 2022-09-08: 30 ug/min via INTRAVENOUS

## 2022-09-08 MED ORDER — LIDOCAINE HCL (PF) 2 % IJ SOLN
INTRAMUSCULAR | Status: AC
  Filled 2022-09-08: qty 5

## 2022-09-08 MED ORDER — PANTOPRAZOLE SODIUM 40 MG PO TBEC
40.0000 mg | DELAYED_RELEASE_TABLET | Freq: Every day | ORAL | Status: DC
  Administered 2022-09-09: 40 mg via ORAL
  Filled 2022-09-08: qty 1

## 2022-09-08 MED ORDER — ACETAMINOPHEN 500 MG PO TABS
1000.0000 mg | ORAL_TABLET | Freq: Three times a day (TID) | ORAL | Status: DC
  Administered 2022-09-09: 1000 mg via ORAL
  Filled 2022-09-08 (×2): qty 2

## 2022-09-08 MED ORDER — 0.9 % SODIUM CHLORIDE (POUR BTL) OPTIME
TOPICAL | Status: DC | PRN
  Administered 2022-09-08: 1000 mL

## 2022-09-08 MED ORDER — TRANEXAMIC ACID-NACL 1000-0.7 MG/100ML-% IV SOLN
INTRAVENOUS | Status: AC
  Filled 2022-09-08: qty 100

## 2022-09-08 MED ORDER — ORAL CARE MOUTH RINSE: 15.0000 mL | Freq: Once | OROMUCOSAL | Status: AC

## 2022-09-08 MED ORDER — PROPOFOL 10 MG/ML IV BOLUS
INTRAVENOUS | Status: AC
  Filled 2022-09-08: qty 20

## 2022-09-08 MED ORDER — CHLORTHALIDONE 25 MG PO TABS
25.0000 mg | ORAL_TABLET | Freq: Every day | ORAL | Status: DC
  Administered 2022-09-08 – 2022-09-09 (×2): 25 mg via ORAL
  Filled 2022-09-08 (×2): qty 1

## 2022-09-08 MED ORDER — FENTANYL CITRATE PF 50 MCG/ML IJ SOSY: 25.0000 ug | PREFILLED_SYRINGE | INTRAMUSCULAR | Status: DC | PRN

## 2022-09-08 MED ORDER — ONDANSETRON HCL 4 MG/2ML IJ SOLN: 4.0000 mg | Freq: Once | INTRAMUSCULAR | Status: DC | PRN

## 2022-09-08 MED ORDER — SUGAMMADEX SODIUM 200 MG/2ML IV SOLN
INTRAVENOUS | Status: DC | PRN
  Administered 2022-09-08: 160 mg via INTRAVENOUS

## 2022-09-08 MED ORDER — PHENYLEPHRINE 80 MCG/ML (10ML) SYRINGE FOR IV PUSH (FOR BLOOD PRESSURE SUPPORT)
PREFILLED_SYRINGE | INTRAVENOUS | Status: AC
  Filled 2022-09-08: qty 10

## 2022-09-08 MED ORDER — VANCOMYCIN HCL 1000 MG IV SOLR
INTRAVENOUS | Status: AC
  Filled 2022-09-08: qty 20

## 2022-09-08 MED ORDER — FENTANYL CITRATE (PF) 100 MCG/2ML IJ SOLN
INTRAMUSCULAR | Status: AC
  Filled 2022-09-08: qty 2

## 2022-09-08 MED ORDER — ATENOLOL 25 MG PO TABS
25.0000 mg | ORAL_TABLET | Freq: Every day | ORAL | Status: DC
  Administered 2022-09-09: 25 mg via ORAL
  Filled 2022-09-08: qty 1

## 2022-09-08 MED ORDER — TRANEXAMIC ACID-NACL 1000-0.7 MG/100ML-% IV SOLN
INTRAVENOUS | Status: DC | PRN
  Administered 2022-09-08: 1000 mg via INTRAVENOUS

## 2022-09-08 MED ORDER — OXYCODONE HCL 5 MG PO TABS: 5.0000 mg | ORAL_TABLET | Freq: Once | ORAL | Status: DC | PRN

## 2022-09-08 MED ORDER — PROPOFOL 10 MG/ML IV BOLUS
INTRAVENOUS | Status: DC | PRN
  Administered 2022-09-08: 100 mg via INTRAVENOUS

## 2022-09-08 MED ORDER — ONDANSETRON HCL 4 MG/2ML IJ SOLN
INTRAMUSCULAR | Status: DC | PRN
  Administered 2022-09-08: 4 mg via INTRAVENOUS

## 2022-09-08 MED ORDER — FENTANYL CITRATE (PF) 100 MCG/2ML IJ SOLN
INTRAMUSCULAR | Status: DC | PRN
  Administered 2022-09-08: 100 ug via INTRAVENOUS

## 2022-09-08 MED ORDER — SUCCINYLCHOLINE CHLORIDE 200 MG/10ML IV SOSY
PREFILLED_SYRINGE | INTRAVENOUS | Status: AC
  Filled 2022-09-08: qty 10

## 2022-09-08 MED ORDER — VANCOMYCIN HCL 1000 MG IV SOLR
INTRAVENOUS | Status: DC | PRN
  Administered 2022-09-08: 1000 mg

## 2022-09-08 MED ORDER — ONDANSETRON HCL 4 MG PO TABS: 4.0000 mg | ORAL_TABLET | Freq: Four times a day (QID) | ORAL | Status: DC | PRN

## 2022-09-08 SURGICAL SUPPLY — 73 items
APL PRP STRL LF DISP 70% ISPRP (MISCELLANEOUS) ×1
BASEPLATE AUG FULL 24 10D (Plate) IMPLANT
BIT DRILL FLUTED 3.0 STRL (BIT) IMPLANT
BLADE SAW SGTL 83.5X18.5 (BLADE) ×1 IMPLANT
BLADE SURG SZ10 CARB STEEL (BLADE) ×2 IMPLANT
BNDG GAUZE ELAST 4 BULKY (GAUZE/BANDAGES/DRESSINGS) ×1 IMPLANT
CALIBRATOR GLENOID VIP 5-D (SYSTAGENIX WOUND MANAGEMENT) IMPLANT
CHLORAPREP W/TINT 26 (MISCELLANEOUS) ×2 IMPLANT
CLOTH BEACON ORANGE TIMEOUT ST (SAFETY) ×1 IMPLANT
COOLER ICEMAN CLASSIC (MISCELLANEOUS) ×1 IMPLANT
COUNTER NDL 20CT MAGNET RED (NEEDLE) IMPLANT
COVER LIGHT HANDLE STERIS (MISCELLANEOUS) ×2 IMPLANT
CUP SUT UNIV REVERSE 33 +2 LT (Cup) IMPLANT
DRAPE HALF SHEET 40X57 (DRAPES) ×1 IMPLANT
DRAPE INCISE IOBAN 44X35 STRL (DRAPES) ×1 IMPLANT
DRAPE SHOULDER BEACH CHAIR (DRAPES) ×1 IMPLANT
DRAPE U-SHAPE 47X51 STRL (DRAPES) ×1 IMPLANT
DRESSING AQUACEL AG ADV 3.5X12 (MISCELLANEOUS) ×1 IMPLANT
DRSG AQUACEL AG ADV 3.5X12 (MISCELLANEOUS) ×1
ELECT REM PT RETURN 9FT ADLT (ELECTROSURGICAL) ×1
ELECTRODE REM PT RTRN 9FT ADLT (ELECTROSURGICAL) ×1 IMPLANT
GLENOSPHERE 33+4 LAT/24 UNI RV (Joint) IMPLANT
GLOVE BIO SURGEON STRL SZ8 (GLOVE) ×1 IMPLANT
GLOVE BIOGEL PI IND STRL 7.0 (GLOVE) ×2 IMPLANT
GLOVE ECLIPSE 6.5 STRL STRAW (GLOVE) IMPLANT
GLOVE SRG 8 PF TXTR STRL LF DI (GLOVE) ×1 IMPLANT
GLOVE SURG UNDER POLY LF SZ8 (GLOVE) ×1
GOWN STRL REUS W/ TWL XL LVL3 (GOWN DISPOSABLE) ×1 IMPLANT
GOWN STRL REUS W/TWL LRG LVL3 (GOWN DISPOSABLE) ×2 IMPLANT
GOWN STRL REUS W/TWL XL LVL3 (GOWN DISPOSABLE) ×1
HANDPIECE INTERPULSE COAX TIP (DISPOSABLE) ×1
HOOD W/PEELAWAY (MISCELLANEOUS) ×3 IMPLANT
IV NS IRRIG 3000ML ARTHROMATIC (IV SOLUTION) ×1 IMPLANT
KIT POSITION SHOULDER SCHLEI (MISCELLANEOUS) ×1 IMPLANT
KIT SET UNIVERSAL (KITS) IMPLANT
KIT STABILIZATION SHOULDER (MISCELLANEOUS) ×1 IMPLANT
KIT TURNOVER KIT A (KITS) ×1 IMPLANT
LINER GLENOID HUM REV 33 +3 (Liner) IMPLANT
MANIFOLD NEPTUNE II (INSTRUMENTS) ×1 IMPLANT
MARKER SKIN DUAL TIP RULER LAB (MISCELLANEOUS) ×1 IMPLANT
NDL HYPO 21X1.5 SAFETY (NEEDLE) IMPLANT
NDL MA TROC 1/2 (NEEDLE) IMPLANT
NEEDLE HYPO 21X1.5 SAFETY (NEEDLE) ×1 IMPLANT
NEEDLE MA TROC 1/2 (NEEDLE) ×1 IMPLANT
NS IRRIG 1000ML POUR BTL (IV SOLUTION) ×1 IMPLANT
PACK BASIC III (CUSTOM PROCEDURE TRAY) ×1
PACK SRG BSC III STRL LF ECLPS (CUSTOM PROCEDURE TRAY) IMPLANT
PACK TOTAL JOINT (CUSTOM PROCEDURE TRAY) ×1 IMPLANT
PAD ABD 5X9 TENDERSORB (GAUZE/BANDAGES/DRESSINGS) IMPLANT
PAD COLD SHLDR WRAP-ON (PAD) IMPLANT
PIN NITINOL TARGETER 2.8 (PIN) IMPLANT
POST MODULAR MGS BASEPLATE 25 (Post) IMPLANT
REAMER ANGLED HEAD SMALL (DRILL) IMPLANT
SCREW PERI LOCK 5.5X16 (Screw) IMPLANT
SCREW PERI LOCK 5.5X24 (Screw) IMPLANT
SCREW PERI NL 4.5X32 (Screw) IMPLANT
SET BASIN LINEN APH (SET/KITS/TRAYS/PACK) ×1 IMPLANT
SET HNDPC FAN SPRY TIP SCT (DISPOSABLE) IMPLANT
SLING ULTRA III MED (ORTHOPEDIC SUPPLIES) IMPLANT
STEM HUMERAL MOD SZ 5 135 DEG (Stem) IMPLANT
STRIP CLOSURE SKIN 1/2X4 (GAUZE/BANDAGES/DRESSINGS) ×2 IMPLANT
SUT MNCRL AB 4-0 PS2 18 (SUTURE) ×1 IMPLANT
SUT MON AB 2-0 CT1 36 (SUTURE) ×1 IMPLANT
SUT VIC AB 0 CT1 27 (SUTURE) ×1
SUT VIC AB 0 CT1 27XBRD ANTBC (SUTURE) ×1 IMPLANT
SUT VIC AB 1 CT1 27 (SUTURE) ×2
SUT VIC AB 1 CT1 27XBRD ANTBC (SUTURE) ×2 IMPLANT
SUTURETAPE 1.3 40 W/NDL BLK/WH (SUTURE) ×2 IMPLANT
SYR 30ML LL (SYRINGE) IMPLANT
SYR BULB IRRIG 60ML STRL (SYRINGE) ×1 IMPLANT
TOWEL OR 17X26 4PK STRL BLUE (TOWEL DISPOSABLE) ×1 IMPLANT
WATER STERILE IRR 1000ML POUR (IV SOLUTION) ×1 IMPLANT
YANKAUER SUCT 12FT TUBE ARGYLE (SUCTIONS) ×1 IMPLANT

## 2022-09-08 NOTE — Interval H&P Note (Signed)
History and Physical Interval Note:  09/08/2022 11:16 AM  Leah Lynch  has presented today for surgery, with the diagnosis of LEFT ROTATOR CUFF ARTHROPATHY.  The various methods of treatment have been discussed with the patient and family. After consideration of risks, benefits and other options for treatment, the patient has consented to  Procedure(s) with comments: LEFT REVERSE SHOULDER ARTHROPLASTY (Left) - RNFA NEEDED as a surgical intervention.  The patient's history has been reviewed, patient examined, no change in status, stable for surgery.  I have reviewed the patient's chart and labs.  Questions were answered to the patient's satisfaction.     Oliver Barre

## 2022-09-08 NOTE — Anesthesia Preprocedure Evaluation (Signed)
Anesthesia Evaluation  Patient identified by MRN, date of birth, ID band Patient awake    Reviewed: Allergy & Precautions, H&P , NPO status , Patient's Chart, lab work & pertinent test results, reviewed documented beta blocker date and time   History of Anesthesia Complications (+) PONV and history of anesthetic complications  Airway Mallampati: II  TM Distance: >3 FB Neck ROM: full    Dental no notable dental hx.    Pulmonary neg pulmonary ROS, shortness of breath, former smoker   Pulmonary exam normal breath sounds clear to auscultation       Cardiovascular Exercise Tolerance: Good hypertension, negative cardio ROS  Rhythm:regular Rate:Normal     Neuro/Psych negative neurological ROS  negative psych ROS   GI/Hepatic negative GI ROS, Neg liver ROS,GERD  ,,  Endo/Other  negative endocrine ROS    Renal/GU negative Renal ROS  negative genitourinary   Musculoskeletal   Abdominal   Peds  Hematology negative hematology ROS (+)   Anesthesia Other Findings   Reproductive/Obstetrics negative OB ROS                             Anesthesia Physical Anesthesia Plan  ASA: 2  Anesthesia Plan: General and General ETT   Post-op Pain Management: Regional block*   Induction:   PONV Risk Score and Plan: Ondansetron  Airway Management Planned:   Additional Equipment:   Intra-op Plan:   Post-operative Plan:   Informed Consent: I have reviewed the patients History and Physical, chart, labs and discussed the procedure including the risks, benefits and alternatives for the proposed anesthesia with the patient or authorized representative who has indicated his/her understanding and acceptance.     Dental Advisory Given  Plan Discussed with: CRNA  Anesthesia Plan Comments:        Anesthesia Quick Evaluation

## 2022-09-08 NOTE — OR Nursing (Signed)
Procedure start [phone]

## 2022-09-08 NOTE — H&P (Signed)
Below is the most recent clinic note for Leah Lynch; any pertinent information regarding their recent medical history will be updated on the day of surgery.   Return patient Visit  Assessment: MEEGHAN Lynch is a 77 y.o. female with the following: Left shoulder rotator cuff arthropathy  Plan: [name] has a large superior rotator cuff tear, with retraction.  She has pain in the left shoulder, limited function.  Based on her ongoing pain and debility, I am recommending surgery, to include a reverse shoulder arthroplasty.  She has not had a cigarette in over a month.  She will continue to refrain from smoking.  We have already obtained medical clearance from Dr. Sudie Bailey.  At this point, we will plan to proceed with a CT scan for preoperative planning.  Once the CT scan has been scheduled, we can proceed with scheduling a date for surgery.  She states understanding.  Risks and benefits of the surgery, including, but not limited to infection, bleeding, persistent pain, need for further surgery, stiffness, instability and more severe complications associated with anesthesia were discussed with the patient.  The patient has elected to proceed.  Patient has been taking hydrocodone for many years.  We have discussed pain management following surgery.  She may have some difficulty with pain control due to chronic narcotic use.   Follow-up: No follow-ups on file.  Subjective:  No chief complaint on file.   History of Present Illness: Leah Lynch is a 77 y.o. female who returns for repeat evaluation of left shoulder pain.  She is right-hand dominant.  She has had pain in her left shoulder that is progressively worsening.  She has had an MRI, which demonstrates a large tear of the rotator cuff with retraction, as well as atrophy.  At the last visit, we did discuss proceeding with reverse shoulder arthroplasty.  She has obtained medical clearance.  She has not had a  cigarette in over a month.  Due to the pain, and ongoing dysfunction in the left shoulder, she is ready to proceed with surgery.  Review of Systems: No fevers or chills No numbness or tingling No chest pain No shortness of breath No bowel or bladder dysfunction No GI distress No headaches    Objective: BP 138/67   Pulse 60   Temp 98.1 F (36.7 C) (Oral)   Resp 13   Ht 4\' 11"  (1.499 m)   Wt 64.4 kg   SpO2 94%   BMI 28.68 kg/m   Physical Exam:  General: Alert and oriented. and No acute distress. Gait: Normal gait.  Evaluation left shoulder demonstrates no deformity.  Atrophy of the posterior shoulder muscles.  No redness.  No bruising.  Forward flexion limited to 90 degrees.  She demonstrates pseudoparalysis.  Fingers are warm and well-perfused.  Sensation intact in the axillary nerve distribution.  2+ radial pulse.  Intact sensation throughout left hand.  Active motion of the deltoid muscle, with some pain in the left shoulder.  IMAGING: I personally reviewed images previously obtained in clinic  Left shoulder MRI IMPRESSION: Complete supraspinatus and infraspinatus tendon tears with tendon retraction to the glenohumeral joint and moderate fatty atrophy of the muscle bellies.   Severe subscapularis tendinopathy with a large longitudinal split tear of the tendon present. There is also moderate subscapularis atrophy.   Complete tear of the long head of biceps from the superior labrum.   Moderate acromioclavicular and mild glenohumeral osteoarthritis.   Degenerative tearing of the superior  and posterior labrum.  New Medications:  Meds ordered this encounter  Medications   tranexamic acid (CYKLOKAPRON) 1000MG /12mL IVPB    Waynard Edwards S: cabinet override   ceFAZolin (ANCEF) 2-4 GM/100ML-% IVPB    Waynard Edwards S: cabinet override      Oliver Barre, MD  09/08/2022 7:18 AM

## 2022-09-08 NOTE — OR Nursing (Signed)
Block to left shoulder completed at 0735 Tolerated procedure well

## 2022-09-08 NOTE — Anesthesia Postprocedure Evaluation (Signed)
Anesthesia Post Note  Patient: Leah Lynch  Procedure(s) Performed: LEFT REVERSE SHOULDER ARTHROPLASTY (Left: Shoulder)  Patient location during evaluation: Phase II Anesthesia Type: General Level of consciousness: awake Pain management: pain level controlled Vital Signs Assessment: post-procedure vital signs reviewed and stable Respiratory status: spontaneous breathing and respiratory function stable Cardiovascular status: blood pressure returned to baseline and stable Postop Assessment: no headache and no apparent nausea or vomiting Anesthetic complications: no Comments: Late entry   No notable events documented.   Last Vitals:  Vitals:   09/08/22 1145 09/08/22 1211  BP: (!) 105/48 (!) 109/42  Pulse: 62 (!) 59  Resp: 13 17  Temp:  37 C  SpO2: 97% 98%    Last Pain:  Vitals:   09/08/22 1130  TempSrc:   PainSc: 0-No pain                 Windell Norfolk

## 2022-09-08 NOTE — Op Note (Signed)
Orthopaedic Surgery Operative Note (CSN: [id number])  MAHIKA VANVOORHIS  [birth year] Date of Surgery: 09/08/2022   Diagnoses:  LEFT ROTATOR CUFF ARTHROPATHY  Procedure: Left reverse shoulder arthroplasty   Operative Finding Successful completion of the planned procedure.  Reverse shoulder arthroplasty with open biceps tenodesis and subscapularis repair   Post-Op Diagnosis: Same Surgeons:Primary: Oliver Barre, MD Assistants:  Cecile Sheerer Location: [facility] OR ROOM 4 Anesthesia: General with regional anesthesia Antibiotics: Ancef 2 g with local vancomycin powder 1 g at the surgical site Tourniquet time: N/A Estimated Blood Loss: 300 cc Complications: None Specimens: None  Implants: Implant Name Type Inv. Item Serial No. Manufacturer Lot No. LRB No. Used Action  BASEPLATE AUG FULL 24 10D - [id number] Plate BASEPLATE AUG FULL 24 10D  [employer] [id number] Left 1 Implanted  POST MODULAR MGS BASEPLATE 25 - [id number] Post POST MODULAR MGS BASEPLATE 25  [employer] [id number] Left 1 Implanted  SCREW PERI NL 4.5X32 - [id number] Screw SCREW PERI NL 4.5X32  [employer] [id number] Left 1 Implanted  SCREW PERI LOCK 5.5X24 - [id number] Screw SCREW PERI LOCK 5.5X24  [employer] [id number] Left 1 Implanted  SCREW PERI LOCK 5.5X16 - [id number] Screw SCREW PERI LOCK 5.5X16  [employer] [id number] Left 2 Implanted  GLENOSPHERE 33+4 LAT/24 UNI RV - [id number] Joint GLENOSPHERE 33+4 LAT/24 UNI RV  [employer] [id number] Left 1 Implanted  CUP SUT UNIV REVERSE 33 +2 LT - [id number] Cup CUP SUT UNIV REVERSE 33 +2 LT  [employer] [id number] Left 1 Implanted  STEM HUMERAL MOD SZ 5 135 DEG - [id number] Stem STEM HUMERAL MOD SZ 5 135 DEG  [employer] [id number] Left 1 Implanted  LINER GLENOID HUM REV 33 +3 - [id number] Liner LINER GLENOID HUM REV 33 +3  [employer] [id number] Left 1 Implanted    Indications for Surgery:   AZURE BUDNICK is a 77 y.o. 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  Benefits and risks of operative and nonoperative management were discussed prior to surgery with the patient and informed consent form was completed.  Specific risks including, but not limited to, infection, need for additional surgery, bleeding, persistent pain, non-union, implant loosening, malunion, stiffness, dislocation, blood clots, blood clots to the lungs and more severe complications associated with anesthesia were discussed with the patient.  The patient has elected to proceed.  Surgical consent was finalized.   Procedure:   The patient was identified properly. Informed consent was obtained and the surgical site was marked. The patient was taken to the operating room where general anesthesia was induced.  The patient was positioned in beach chair position.  The left shoulder was prepped and draped in the usual sterile fashion.  Timeout was performed before the beginning of the case.  The patient received appropriate antibiotics prior to making incision.  In addition, the patient received 1 g TXA prior to starting.  This was confirmed during the preincision timeout.  We made an incision for the standard deltopectoral approach was performed with a #10 blade. We dissected down through the subcutaneous tissues and the cephalic vein was taken laterally with the deltoid. The clavipectoral fascia was incised in line with the incision. Deep retractors were placed. The long of the biceps tendon was identified and there was significant tenosynovitis present.  A biceps tenodesis was performed to the pectoralis tendon with #2 Fiberwire. The remaining biceps was followed up into the rotator interval where it was released.   The subscapularis  was taken down in a full thickness layer with capsule along the humeral neck extending inferiorly around the humeral head. We continued releasing the capsule directly off of the osteophytes inferiorly all the way around the corner. This allowed [place] to  dislocate the humeral head. Multiple tag stitches were placed in the subscapularis tendon to maintain control during the release of the tendon, and for the remainder of the case.   The humeral head had evidence of severe osteoarthritic wear with full-thickness cartilage loss and exposed subchondral bone.   The rotator cuff was carefully examined and noted to be irreperably torn.  The decision was confirmed that a reverse total shoulder was indicated for this patient.  There were osteophytes along the inferior humeral neck. The osteophytes were removed using an osteotome and a rongeur.  At this point, the anatomic neck was well visualized.     A humeral cutting guide with a version rod was secured to the anterior aspect of the humeral head. The version was set at 20 of retroversion. A humeral osteotomy was performed with an oscillating saw. The head fragment was passed off to the back table, and used to estimate the humeral head size for our implant. A cut protector was placed.  The humerus was retracted posteriorly and we turned our attention to glenoid exposure.  The subscapularis was again identified and we took care to palpate the axillary nerve anteriorly and verify its position with gentle palpation as well as the tug test.  We then released the SGHL with bovie cautery prior to placing a curved mayo at the junction of the anterior glenoid well above the axillary nerve and bluntly dissecting the subscapularis from the capsule.  We then carefully protected the axillary nerve as we gently released the inferior capsule to fully mobilize the subscapularis.  An anterior deltoid retractor was then placed as well as a small Hohmann retractor superiorly.   The glenoid was inspected and had evidence of severe osteoarthritic wear with full-thickness cartilage loss and exposed subchondral bone.  There were large superior osteophytes.  Eccentric wear pattern. The remaining labrum was removed circumferentially  taking great care not to disrupt the posterior capsule.   Based on preoperative templating, we placed the guide on the glenoid face.  Next, we drilled the guide pin at roughly the center of the glenoid, according to the patient specific guide. The glenoid face was then reamed eccentrically over the guide wire, planning for a 10 degree augment at the 11:30 position. The center hole was drilled over the guidepin in a near anatomic angle of version. Next the glenoid vault was drilled back to a depth of 25 mm.  We tapped and then placed a 24 mm size baseplate with 0 mm lateralization was selected with a 25 mm x post.  The base plate was pressfit into the glenoid vault obtaining secure fixation. We next placed an inferior non locking screw for additional fixation, followed by 3 locking screws.  Next a 33 mm glenosphere was selected and impacted onto the baseplate. The center screw was tightened.    We turned attention back to the humeral side. The cut protector was removed. A starter awl was used to open the humeral canal. We next used T-handle straight reamers to ream up to an appropriate fit. We then broached up to size 6 which obtained an appropriate fit. The broach handle was removed.We trialed with multiple size tray and polyethylene options and selected a 33 + 3 mm posterior offset  which provided good stability and range of motion without excess soft tissue tension. The shoulder was trialed.  There was good ROM in all planes and the shoulder was stable with no inferior translation.  The real humeral implants were opened after again confirming sizes.  The trial was removed. The humeral component was press-fit obtaining a secure fit. A +2 mm posterior offset tray was selected and impacted onto the stem.  A 33+3 polyethylene liner was impacted onto the stem.  The joint was reduced and thoroughly irrigated with pulsatile lavage and then placed local antibiotic as listed above.     The Subscapularis was  repaired back with #2 Fiberwire sutures through the eyelets on the stem. Hemostasis was obtained. The deltopectoral interval was reapproximated with #1 Vicryl. The subcutaneous tissues were closed with 2-0 monocryl and the skin was closed with running monocryl.      The wounds were cleaned and dried and an Aquacel dressing was placed. The drapes taken down. The arm was placed into sling with abduction pillow. Patient was awakened, extubated, and transferred to the recovery room in stable condition. There were no intraoperative complications. The sponge, needle, and attention counts were  correct at the end of the case.     Post-operative plan:  The patient will be admitted for over night observation.   We have placed a referral for PT to begin 1-2 weeks postop, prior to the first postoperative clinic visit.  DVT prophylaxis Aspirin 81 mg twice daily for 6 weeks.    Pain control with PRN pain medication preferring oral medicines.   Follow up plan will be scheduled in approximately 10-14 days for incision check and XR.

## 2022-09-08 NOTE — Anesthesia Procedure Notes (Signed)
Procedure Name: Intubation Date/Time: 09/08/2022 8:17 AM  Performed by: Marquis Buggy, CRNAPre-anesthesia Checklist: Patient identified, Emergency Drugs available, Suction available, Patient being monitored and Timeout performed Patient Re-evaluated:Patient Re-evaluated prior to induction Oxygen Delivery Method: Circle system utilized Preoxygenation: Pre-oxygenation with 100% oxygen Induction Type: IV induction Laryngoscope Size: Miller and 2 Grade View: Grade I Tube type: Oral Tube size: 7.0 mm Number of attempts: 1 Airway Equipment and Method: Stylet Placement Confirmation: ETT inserted through vocal cords under direct vision, positive ETCO2 and breath sounds checked- equal and bilateral Secured at: 21 cm Tube secured with: Tape Dental Injury: Teeth and Oropharynx as per pre-operative assessment

## 2022-09-08 NOTE — OR Nursing (Signed)
Time out done left reverse shoulder block Confirmed  Staff Dr. Johnnette Litter and Waynard Edwards Rn-BC

## 2022-09-08 NOTE — Transfer of Care (Signed)
Immediate Anesthesia Transfer of Care Note  Patient: Leah Lynch  Procedure(s) Performed: LEFT REVERSE SHOULDER ARTHROPLASTY (Left: Shoulder)  Patient Location: PACU  Anesthesia Type:General  Level of Consciousness: awake, alert , and oriented  Airway & Oxygen Therapy: Patient Spontanous Breathing and Patient connected to nasal cannula oxygen  Post-op Assessment: Report given to RN, Post -op Vital signs reviewed and stable, Patient moving all extremities, and Patient moving all extremities X 4  Post vital signs: Reviewed and stable  Last Vitals:  Vitals Value Taken Time  BP 111/45 09/08/22 1101  Temp    Pulse 65 09/08/22 1112  Resp 13 09/08/22 1112  SpO2 97 % 09/08/22 1112  Vitals shown include unvalidated device data.  Last Pain:  Vitals:   09/08/22 0644  TempSrc: Oral  PainSc: 7       Patients Stated Pain Goal: 7 (09/08/22 1610)  Complications: No notable events documented.

## 2022-09-09 ENCOUNTER — Other Ambulatory Visit: Payer: Self-pay

## 2022-09-09 ENCOUNTER — Ambulatory Visit: Payer: 59 | Admitting: Orthopedic Surgery

## 2022-09-09 DIAGNOSIS — M75102 Unspecified rotator cuff tear or rupture of left shoulder, not specified as traumatic: Secondary | ICD-10-CM | POA: Diagnosis not present

## 2022-09-09 DIAGNOSIS — Z96612 Presence of left artificial shoulder joint: Secondary | ICD-10-CM

## 2022-09-09 DIAGNOSIS — I10 Essential (primary) hypertension: Secondary | ICD-10-CM | POA: Diagnosis not present

## 2022-09-09 DIAGNOSIS — R7309 Other abnormal glucose: Secondary | ICD-10-CM | POA: Diagnosis not present

## 2022-09-09 MED ORDER — ONDANSETRON HCL 4 MG PO TABS: 4.0000 mg | ORAL_TABLET | Freq: Three times a day (TID) | ORAL | 0 refills | Status: AC | PRN

## 2022-09-09 MED ORDER — ASPIRIN 81 MG PO TBEC: 81.0000 mg | DELAYED_RELEASE_TABLET | Freq: Two times a day (BID) | ORAL | 0 refills | Status: AC

## 2022-09-09 MED ORDER — OXYCODONE HCL 5 MG PO TABS: 5.0000 mg | ORAL_TABLET | ORAL | 0 refills | Status: AC | PRN

## 2022-09-09 MED ORDER — ACETAMINOPHEN 500 MG PO TABS: 1000.0000 mg | ORAL_TABLET | Freq: Three times a day (TID) | ORAL | 0 refills | Status: AC

## 2022-09-09 NOTE — TOC Transition Note (Signed)
Transition of Care [facility]) - CM/SW Discharge Note   Patient Details  Name: Leah Lynch MRN: [id number] Date of Birth: 04-13-1945  Transition of Care [facility]) CM/SW Contact:  Villa Herb, LCSWA Phone Number: 09/09/2022, 10:34 AM   Clinical Narrative:    CSW updated that PT and OT are recommending [facility] services for pt at D/C. CSW spoke with pt who states she is agreeable to referral being made and does not have an agency preference. CSW spoke to [place] with [facility] who states he will speak with Dr. Dallas Schimke office. CSW requested that MD place [facility] PT/OT orders, MD states that he will have his office coordinate services. TOC signing off.     Barriers to Discharge: No Barriers Identified   Patient Goals and CMS Choice CMS Medicare.gov Compare Post Acute Care list provided to:: Patient Choice offered to / list presented to : Patient  Discharge Placement                         Discharge Plan and Services Additional resources added to the After Visit Summary for                            [facility] Arranged: PT, OT [facility] Agency: [facility] Date [facility] Agency Contacted: 09/09/22   Representative spoke with at [facility] Agency: Clifton Custard  Social Determinants of Health (SDOH) Interventions SDOH Screenings   Food Insecurity: No Food Insecurity (09/08/2022)  Housing: Low Risk  (09/08/2022)  Transportation Needs: No Transportation Needs (09/08/2022)  Utilities: Not At Risk (09/08/2022)  Depression (PHQ2-9): Low Risk  (08/27/2022)  Tobacco Use: Medium Risk (09/08/2022)     Readmission Risk Interventions     No data to display

## 2022-09-09 NOTE — Discharge Instructions (Signed)
Caliann Leckrone A. Dallas Schimke, MD MS [facility] [address] [place],  [place]  [address] Phone: [phone] Fax: [phone]    POST-OPERATIVE INSTRUCTIONS - TOTAL SHOULDER REPLACEMENT    WOUND CARE You may leave the operative dressing in place until your follow-up appointment. KEEP THE INCISIONS CLEAN AND DRY. There may be a small amount of fluid/bleeding leaking at the surgical site. This is normal after surgery.  If it fills with liquid or blood please call us immediately to change it for you. Use the provided ice machine or Ice packs as often as possible for the first 3-4 days, then as needed for pain relief.  Keep a layer of cloth or a shirt between your skin and the cooling unit to prevent frost bite as it can get very cold.  SHOWERING: - You may shower on Post-Op Day #3.  - The dressing is water resistant but do not scrub it as it may start to peel up.   - You may remove the sling for showering, but keep a water resistant pillow under the arm to keep both the  elbow and shoulder away from the body (mimicking the abduction sling).  - Gently pat the area dry.  - Do not soak the shoulder in water. Do not go swimming in the pool or ocean until your sutures are removed. - KEEP THE INCISIONS CLEAN AND DRY.  EXERCISES Wear the sling at all times except when doing your exercises. You may remove the sling for showering, but keep the arm across the chest or in a secondary sling.    Accidental/Purposeful External Rotation and shoulder flexion (reaching behind you) is to be avoided at all costs for the first month. It is ok to come out of your sling if your are sitting and have assistance for eating.  Do not lift anything heavier than 1 pound until we discuss it further in clinic. Please perform the exercises:   Elbow / Hand / Wrist  Range of Motion Exercises Grip strengthening   REGIONAL ANESTHESIA (NERVE BLOCKS) The anesthesia team may have performed a nerve block  for you if safe in the setting of your care.  This is a great tool used to minimize pain.  Typically the block may start wearing off overnight but the long acting medicine may last for 3-4 days.  The nerve block wearing off can be a challenging period but please utilize your as needed pain medications to try and manage this period.    POST-OP MEDICATIONS- Multimodal approach to pain control In general your pain will be controlled with a combination of substances.  Prescriptions unless otherwise discussed are electronically sent to your pharmacy.  This is a carefully made plan we use to minimize narcotic use.     Naproxen - Anti-inflammatory medication taken on a scheduled basis Acetaminophen - Non-narcotic pain medicine taken on a scheduled basis  Oxycodone - This is a strong narcotic, to be used only on an "as needed" basis for pain. Aspirin 81mg  - This medicine is used to minimize the risk of blood clots after surgery. Zofran -  take as needed for nausea  Meloxicam/Celebrex - these are anti-inflammatory and pain relievers.  Do not take additional ibuprofen, naproxen or other NSAID while taking this medicine.   FOLLOW-UP If you develop a Fever (>101.5), Redness or Drainage from the surgical incision site, please call our office to arrange for an evaluation. Please call the office to schedule a follow-up appointment for a  wound check, 7-10 days post-operatively.  

## 2022-09-09 NOTE — Discharge Summary (Signed)
Patient ID: Leah Lynch MRN: [id number] DOB/AGE: October 28, 1945 77 y.o.  Admit date: 09/08/2022 Discharge date: 09/09/2022  Admission Diagnoses:Left rotator cuff arthropathy  Discharge Diagnoses:  Principal Problem:   Rotator cuff arthropathy of left shoulder   Past Medical History:  Diagnosis Date   Fibrocystic breast    GERD (gastroesophageal reflux disease)    Hyperlipidemia    Hypertension    Osteoarthritis    Osteoporosis    PONV (postoperative nausea and vomiting)    Pre-diabetes    Shortness of breath dyspnea      Procedures Performed: Left Reverse Shoulder Arthroplasty  Discharged Condition: good  Hospital Course: Patient brought in as an outpatient for surgery.  Tolerated procedure well.  Was kept for monitoring overnight for pain control and medical monitoring postop and was found to be stable for DC home the morning after surgery.  Patient was evaluated by OT/OT prior to discharge.  Patient was instructed on specific activity restrictions and all questions were answered.  Patient was discharged on POD#1 in stable condition.  They will contact the clinic if they have any concerns upon discharge.    Consults: None  Significant Diagnostic Studies: No additional pertinent studies  Treatments: Surgery  Discharge Exam:  Today's Vitals   09/08/22 2046 09/08/22 2126 09/09/22 0411 09/09/22 0424  BP: (!) 113/55  (!) 117/58   Pulse: 76  81   Resp: 17  18   Temp: 99.1 F (37.3 C)  98.9 F (37.2 C)   TempSrc: Oral  Oral   SpO2: 91%  92%   Weight:      Height:      PainSc: 10-Worst pain ever Asleep  Asleep      Alert and oriented, no acute distress  Sling and ice machine fitting appropriately. Dressing is clean dry and intact Active motion intact throughout the hand Sensation intact in the axillary nerve distribution. Fingers are warm and well perfused   CBC     Latest Ref Rng & Units 09/08/2022   11:18 AM 08/27/2022    3:02 PM 01/26/2018     3:21 PM  CBC  WBC 3.4 - 10.8 x10E3/uL  10.0  11.6   Hemoglobin 12.0 - 15.0 g/dL 25.9  56.3  87.5   Hematocrit 36.0 - 46.0 % 37.3  38.4  43.7   Platelets 150 - 450 x10E3/uL  269  305         Disposition: Discharge disposition: 01-Home or Self Care        Allergies as of 09/09/2022       Reactions   Bee Venom Swelling   Penicillins Hives, Itching, Other (See Comments)   Has patient had a PCN reaction causing immediate rash, facial/tongue/throat swelling, SOB or lightheadedness with hypotension: Yes Has patient had a PCN reaction causing severe rash involving mucus membranes or skin necrosis: No Has patient had a PCN reaction that required hospitalization No Has patient had a PCN reaction occurring within the last 10 years: YES If all of the above answers are "NO", then may proceed with Cephalosporin use.   Sulfa Antibiotics Itching        Medication List     STOP taking these medications    HYDROcodone-acetaminophen 10-325 MG tablet Commonly known as: NORCO       TAKE these medications    acetaminophen 500 MG tablet Commonly known as: TYLENOL Take 2 tablets (1,000 mg total) by mouth every 8 (eight) hours for 14 days.   aspirin EC 81 MG  tablet Take 1 tablet (81 mg total) by mouth in the morning and at bedtime. Swallow whole.   atenolol 25 MG tablet Commonly known as: TENORMIN Take 25 mg by mouth daily.   atorvastatin 40 MG tablet Commonly known as: LIPITOR Take 40 mg by mouth daily.   chlorthalidone 25 MG tablet Commonly known as: HYGROTON Take 25 mg by mouth daily.   furosemide 40 MG tablet Commonly known as: LASIX Take 40 mg by mouth daily.   gabapentin 300 MG capsule Commonly known as: NEURONTIN Take 300 mg by mouth 3 (three) times daily as needed (pain).   multivitamin tablet Take 1 tablet by mouth daily. Centrum silver woman+   naproxen 500 MG tablet Commonly known as: NAPROSYN Take 500 mg by mouth 2 (two) times daily with a meal.    nitroGLYCERIN 0.4 MG SL tablet Commonly known as: NITROSTAT Place 0.4 mg under the tongue every 5 (five) minutes as needed for chest pain.   omeprazole 20 MG capsule Commonly known as: PRILOSEC Take 20 mg by mouth daily.   ondansetron 4 MG tablet Commonly known as: Zofran Take 1 tablet (4 mg total) by mouth every 8 (eight) hours as needed for up to 14 days for nausea or vomiting.   oxyCODONE 5 MG immediate release tablet Commonly known as: Roxicodone Take 1 tablet (5 mg total) by mouth every 4 (four) hours as needed for up to 7 days.   potassium chloride 10 MEQ CR tablet Commonly known as: KLOR-CON Take 10 mEq by mouth 2 (two) times daily.   ProAir HFA 108 (90 Base) MCG/ACT inhaler Generic drug: albuterol Inhale 2 puffs into the lungs every 6 (six) hours as needed for wheezing or shortness of breath.        Follow-up Information     Oliver Barre, MD Follow up.   Specialties: Orthopedic Surgery, Sports Medicine Why: 10-14 days for suture removal/incision check Contact information: [address] [place] [address] [phone]

## 2022-09-09 NOTE — Progress Notes (Addendum)
All discharge instructions,medications,appts reviewed with pt.and son . All questions and concerns addressed. Belonging verified and being taken home.   Pt.waiting on daughter to pick her up

## 2022-09-09 NOTE — Care Management Obs Status (Signed)
MEDICARE OBSERVATION STATUS NOTIFICATION   Patient Details  Name: Leah Lynch MRN: [id number] Date of Birth: 04-22-1945   Medicare Observation Status Notification Given:  Yes  Copy mailed to address on file  Corey Harold 09/09/2022, 11:33 AM

## 2022-09-09 NOTE — Evaluation (Signed)
Occupational Therapy Evaluation Patient Details Name: Leah Lynch MRN: [id number] DOB: 03-05-1946 Today's Date: 09/09/2022   History of Present Illness Leah Lynch is a 77 y.o. female who returns for repeat evaluation of left shoulder pain.  She is right-hand dominant.  She has had pain in her left shoulder that is progressively worsening.  She has had an MRI, which demonstrates a large tear of the rotator cuff with retraction, as well as atrophy.  At the last visit, we did discuss proceeding with reverse shoulder arthroplasty.  She has obtained medical clearance.  She has not had a cigarette in over a month.  Due to the pain, and ongoing dysfunction in the left shoulder, she is ready to proceed with surgery. Status post Left reverse shoulder arthroplasty. (per MD)   Clinical Impression   Pt agreeable to OT and PT co-evaluation. Pt reports having assist from boyfriend at home 24/7 if needed. Pt noted to be on 3 to 4 LPM supplemental O2 at the start of the session. O2 was removed and pt desaturated to ~86% SpO2. Pt placed on 2 LPM and returned to above 90% SpO2. Pt was mildly unsteady in standing and benefited from use of cane. Min to mod A will be needed for bathing and dressing tasks most likely. Pt reports her family and boyfriend will be able to provide this support. Pt is not recommended for further acute OT services and will be discharged to care of nursing staff for remaining length of stay.          Recommendations for follow up therapy are one component of a multi-disciplinary discharge planning process, led by the attending physician.  Recommendations may be updated based on patient status, additional functional criteria and insurance authorization.   Assistance Recommended at Discharge Intermittent Supervision/Assistance  Patient can return home with the following A little help with bathing/dressing/bathroom;Assistance with cooking/housework;Assist for transportation;Help  with stairs or ramp for entrance    Functional Status Assessment  Patient has had a recent decline in their functional status and demonstrates the ability to make significant improvements in function in a reasonable and predictable amount of time.  Equipment Recommendations  None recommended by OT           Precautions / Restrictions Precautions Precautions: Fall Precaution Comments: DonJoy sling Required Braces or Orthoses: Sling Restrictions Weight Bearing Restrictions: Yes LUE Weight Bearing: Non weight bearing      Mobility Bed Mobility Overal bed mobility: Modified Independent             General bed mobility comments: Mild labored movement.    Transfers Overall transfer level: Needs assistance   Transfers: Sit to/from Stand, Bed to chair/wheelchair/BSC Sit to Stand: Min assist     Step pivot transfers: Min guard, Supervision     General transfer comment: Single hand held assist to boost from EOB. Unsteady on feet without cane needing min G assist once up.      Balance Overall balance assessment: Needs assistance Sitting-balance support: No upper extremity supported, Feet supported Sitting balance-Leahy Scale: Good Sitting balance - Comments: seated at EOB   Standing balance support: During functional activity, Single extremity supported Standing balance-Leahy Scale: Fair Standing balance comment: fair to good ; better with cane                           ADL either performed or assessed with clinical judgement   ADL Overall ADL's : Needs assistance/impaired  Eating/Feeding: Set up   Grooming: Modified independent;Supervision/safety;Standing   Upper Body Bathing: Minimal assistance;Sitting   Lower Body Bathing: Moderate assistance;Sitting/lateral leans;Minimal assistance   Upper Body Dressing : Minimal assistance;Sitting   Lower Body Dressing: Minimal assistance;Sitting/lateral leans   Toilet Transfer: Supervision/safety;Modified  Independent;Ambulation (cane) Toilet Transfer Details (indicate cue type and reason): Ambulated to toilet from chair without cane. Cane used for ambulation to sink from the toilet. Toileting- Clothing Manipulation and Hygiene: Set up;Sitting/lateral lean       Functional mobility during ADLs: Supervision/safety;Modified independent;Cane General ADL Comments: Pt able to ambulate in the room.     Vision Baseline Vision/History: 1 Wears glasses Ability to See in Adequate Light: 1 Impaired Patient Visual Report: No change from baseline Vision Assessment?: No apparent visual deficits                Pertinent Vitals/Pain Pain Assessment Pain Assessment: Faces Faces Pain Scale: Hurts little more Pain Location: L shoulder. Pain Descriptors / Indicators: Grimacing, Guarding Pain Intervention(s): Limited activity within patient's tolerance, Monitored during session, Repositioned     Hand Dominance Right   Extremity/Trunk Assessment Upper Extremity Assessment Upper Extremity Assessment: LUE deficits/detail;RUE deficits/detail RUE Deficits / Details: WFL A/ROM. LUE Deficits / Details: NWB in sling. Sling adjusted to better position.   Lower Extremity Assessment Lower Extremity Assessment: Defer to PT evaluation   Cervical / Trunk Assessment Cervical / Trunk Assessment: Normal   Communication Communication Communication: No difficulties   Cognition Arousal/Alertness: Awake/alert Behavior During Therapy: WFL for tasks assessed/performed Overall Cognitive Status: Within Functional Limits for tasks assessed                                                        Home Living Family/patient expects to be discharged to:: Private residence Living Arrangements: Spouse/significant other Available Help at Discharge: Other (Comment);Available 24 hours/day (boyfriend) Type of Home: House Home Access: Ramped entrance     Home Layout: One level     Bathroom  Shower/Tub: Chief Strategy Officer: Standard Bathroom Accessibility: Yes How Accessible: Accessible via walker;Accessible via wheelchair Home Equipment: Gilmer Mor - single point          Prior Functioning/Environment Prior Level of Function : Independent/Modified Independent             Mobility Comments: Tourist information centre manager without AD ADLs Comments: Independent                                Co-evaluation PT/OT/SLP Co-Evaluation/Treatment: Yes Reason for Co-Treatment: To address functional/ADL transfers   OT goals addressed during session: ADL's and self-care      AM-PAC OT "6 Clicks" Daily Activity     Outcome Measure Help from another person eating meals?: A Little Help from another person taking care of personal grooming?: None Help from another person toileting, which includes using toliet, bedpan, or urinal?: A Little Help from another person bathing (including washing, rinsing, drying)?: A Little Help from another person to put on and taking off regular upper body clothing?: A Little Help from another person to put on and taking off regular lower body clothing?: A Little 6 Click Score: 19   End of Session Equipment Utilized During Treatment: Other (comment);Oxygen (cane)  Activity Tolerance: Patient tolerated treatment  well Patient left: in chair;with call bell/phone within reach  OT Visit Diagnosis: Other abnormalities of gait and mobility (R26.89);Unsteadiness on feet (R26.81);Muscle weakness (generalized) (M62.81);Pain Pain - Right/Left: Left Pain - part of body: Shoulder                Time: 1610-9604 OT Time Calculation (min): 18 min Charges:  OT General Charges $OT Visit: 1 Visit OT Evaluation $OT Eval Low Complexity: 1 Low  Semir Brill OT, MOT  Danie Chandler 09/09/2022, 9:16 AM

## 2022-09-09 NOTE — Evaluation (Signed)
Physical Therapy Evaluation Patient Details Name: Leah Lynch MRN: [id number] DOB: [birth year] Today's Date: 09/09/2022  History of Present Illness  Leah Lynch is a 77 y.o. female who returns for repeat evaluation of left shoulder pain.  She is right-hand dominant.  She has had pain in her left shoulder that is progressively worsening.  She has had an MRI, which demonstrates a large tear of the rotator cuff with retraction, as well as atrophy.  At the last visit, we did discuss proceeding with reverse shoulder arthroplasty.  She has obtained medical clearance.  She has not had a cigarette in over a month.  Due to the pain, and ongoing dysfunction in the left shoulder, she is ready to proceed with surgery. Status post Left reverse shoulder arthroplasty.   Clinical Impression  Patient requires increased time for sitting up at bedside labored movement, required hand held assist for initial sit to stand and required use of [facility] for safety.  Patient demonstrates good return for ambulating inn room using SPC without loss of balance, but limited mostly due to c/o mild lightheadedness possible due to lack of food per patient.  Patient on room air with SpO2 at 86% after sitting in chair, put back on 2 LPM with SpO2 increasing to 96% - nursing staff notified.  PLAN:  Patient to be discharged home today and discharged from acute physical therapy to care of nursing for ambulation as tolerated for length of stay with recommendations stated below       Recommendations for follow up therapy are one component of a multi-disciplinary discharge planning process, led by the attending physician.  Recommendations may be updated based on patient status, additional functional criteria and insurance authorization.  Follow Up Recommendations       Assistance Recommended at Discharge Set up Supervision/Assistance  Patient can return home with the following  A little help with walking and/or transfers;A  little help with bathing/dressing/bathroom;Help with stairs or ramp for entrance;Assistance with cooking/housework    Equipment Recommendations None recommended by PT  Recommendations for Other Services       Functional Status Assessment Patient has had a recent decline in their functional status and demonstrates the ability to make significant improvements in function in a reasonable and predictable amount of time.     Precautions / Restrictions Precautions Precautions: Fall Precaution Comments: DonJoy sling Required Braces or Orthoses: Sling Restrictions Weight Bearing Restrictions: Yes LUE Weight Bearing: Non weight bearing      Mobility  Bed Mobility Overal bed mobility: Modified Independent             General bed mobility comments: increased time, labored movement    Transfers Overall transfer level: Needs assistance   Transfers: Sit to/from Stand, Bed to chair/wheelchair/BSC Sit to Stand: Min assist   Step pivot transfers: Min guard, Supervision       General transfer comment: labored movement requiring hand held assist for completing sit to stands, good return for using [facility]    Ambulation/Gait Ambulation/Gait assistance: Supervision Gait Distance (Feet): 15 Feet Assistive device: Straight cane Gait Pattern/deviations: Decreased step length - right, Decreased step length - left, Decreased stride length, Step-to pattern Gait velocity: decreased     General Gait Details: slightly labored movement with mostly step-to pattern using SPC without loss of balance, limited for ambulation mostly due to c/o mild light headedness, on room air with SpO2 at 86%  Careers information officer  Modified Rankin (Stroke Patients Only)       Balance Overall balance assessment: Needs assistance Sitting-balance support: Feet supported, No upper extremity supported Sitting balance-Leahy Scale: Good Sitting balance - Comments: seated at EOB    Standing balance support: During functional activity, No upper extremity supported Standing balance-Leahy Scale: Poor Standing balance comment: fair/poor without AD, fair/good using SPC                             Pertinent Vitals/Pain Pain Assessment Pain Assessment: Faces Faces Pain Scale: Hurts little more Pain Location: L shoulder. Pain Descriptors / Indicators: Grimacing, Guarding Pain Intervention(s): Limited activity within patient's tolerance, Monitored during session, Repositioned    Home Living Family/patient expects to be discharged to:: Private residence Living Arrangements: Spouse/significant other Available Help at Discharge: Other (Comment);Available 24 hours/day Type of Home: House Home Access: Ramped entrance       Home Layout: One level Home Equipment: Cane - single point      Prior Function Prior Level of Function : Independent/Modified Independent             Mobility Comments: Tourist information centre manager without AD ADLs Comments: Independent     Hand Dominance   Dominant Hand: Right    Extremity/Trunk Assessment   Upper Extremity Assessment Upper Extremity Assessment: Defer to OT evaluation RUE Deficits / Details: WFL A/ROM. LUE Deficits / Details: NWB in sling. Sling adjusted to better position.    Lower Extremity Assessment Lower Extremity Assessment: Generalized weakness    Cervical / Trunk Assessment Cervical / Trunk Assessment: Normal  Communication   Communication: No difficulties  Cognition Arousal/Alertness: Awake/alert Behavior During Therapy: WFL for tasks assessed/performed Overall Cognitive Status: Within Functional Limits for tasks assessed                                          General Comments      Exercises     Assessment/Plan    PT Assessment All further PT needs can be met in the next venue of care  PT Problem List Decreased strength;Decreased range of motion;Decreased activity  tolerance;Decreased mobility       PT Treatment Interventions      PT Goals (Current goals can be found in the Care Plan section)  Acute Rehab PT Goals Patient Stated Goal: return home with boyfriend to assist PT Goal Formulation: With patient Time For Goal Achievement: 09/09/22 Potential to Achieve Goals: Good    Frequency       Co-evaluation PT/OT/SLP Co-Evaluation/Treatment: Yes Reason for Co-Treatment: To address functional/ADL transfers PT goals addressed during session: Mobility/safety with mobility;Balance;Proper use of DME OT goals addressed during session: ADL's and self-care       AM-PAC PT "6 Clicks" Mobility  Outcome Measure Help needed turning from your back to your side while in a flat bed without using bedrails?: None Help needed moving from lying on your back to sitting on the side of a flat bed without using bedrails?: A Little Help needed moving to and from a bed to a chair (including a wheelchair)?: A Little Help needed standing up from a chair using your arms (e.g., wheelchair or bedside chair)?: A Little Help needed to walk in hospital room?: A Little Help needed climbing 3-5 steps with a railing? : A Little 6 Click Score: 19    End of  Session Equipment Utilized During Treatment: Oxygen Activity Tolerance: Patient tolerated treatment well;Patient limited by fatigue Patient left: in chair;with call bell/phone within reach Nurse Communication: Mobility status PT Visit Diagnosis: Unsteadiness on feet (R26.81);Other abnormalities of gait and mobility (R26.89);Muscle weakness (generalized) (M62.81)    Time: 0737-1062 PT Time Calculation (min) (ACUTE ONLY): 27 min   Charges:   PT Evaluation $PT Eval Moderate Complexity: 1 Mod PT Treatments $Therapeutic Activity: 23-37 mins        10:02 AM, 09/09/22 Ocie Bob, MPT Physical Therapist with [facility] [phone] office [phone] mobile phone

## 2022-09-09 NOTE — Plan of Care (Signed)

## 2022-09-10 ENCOUNTER — Telehealth: Payer: Self-pay

## 2022-09-10 NOTE — Transitions of Care (Post Inpatient/ED Visit) (Signed)
09/10/2022  Name: Leah Lynch MRN: [id number] DOB: [birth year]  Today's TOC FU Call Status: Today's TOC FU Call Status:: Successful TOC FU Call Competed TOC FU Call Complete Date: 09/10/22  Transition Care Management Follow-up Telephone Call Date of Discharge: 09/09/22 Discharge [facility]: [facility] Type of Discharge: Inpatient Admission Primary Inpatient Discharge Diagnosis:: rotator cuff repair How have you been since you were released from the hospital?: Better Any questions or concerns?: No  Items Reviewed: Did you receive and understand the discharge instructions provided?: Yes Medications obtained,verified, and reconciled?: Yes (Medications Reviewed) Any new allergies since your discharge?: No Dietary orders reviewed?: Yes Do you have support at home?: Yes People in Home: spouse  Medications Reviewed Today: Medications Reviewed Today     Reviewed by Karena Addison, LPN (Licensed Practical Nurse) on 09/10/22 at 0935  Med List Status: <None>   Medication Order Taking? Sig Documenting Provider Last Dose Status Informant  acetaminophen (TYLENOL) 500 MG tablet [id number]  Take 2 tablets (1,000 mg total) by mouth every 8 (eight) hours for 14 days. Oliver Barre, MD  Active   aspirin EC 81 MG tablet [id number]  Take 1 tablet (81 mg total) by mouth in the morning and at bedtime. Swallow whole. Oliver Barre, MD  Active   atenolol (TENORMIN) 25 MG tablet [id number] No Take 25 mg by mouth daily. [provider] 09/08/2022 0330 Active Self  atorvastatin (LIPITOR) 40 MG tablet [id number] No Take 40 mg by mouth daily.  [provider] 09/07/2022 Active Self  chlorthalidone (HYGROTON) 25 MG tablet [id number] No Take 25 mg by mouth daily. [provider] 09/07/2022 Active Self  furosemide (LASIX) 40 MG tablet [id number] No Take 40 mg by mouth daily. [provider] 09/07/2022 Active Self  gabapentin (NEURONTIN) 300 MG capsule [id number] No Take 300  mg by mouth 3 (three) times daily as needed (pain). [provider] 09/08/2022 0330 Active Self  Multiple Vitamin (MULTIVITAMIN) tablet [id number] No Take 1 tablet by mouth daily. Centrum silver woman+ [provider] 09/07/2022 Active Self  naproxen (NAPROSYN) 500 MG tablet [id number] No Take 500 mg by mouth 2 (two) times daily with a meal. [provider] 09/07/2022 Active Self  nitroGLYCERIN (NITROSTAT) 0.4 MG SL tablet [id number] No Place 0.4 mg under the tongue every 5 (five) minutes as needed for chest pain. [provider] Unknown Active Self  omeprazole (PRILOSEC) 20 MG capsule [id number] No Take 20 mg by mouth daily. [provider] 09/08/2022 0330 Active Self  ondansetron (ZOFRAN) 4 MG tablet [id number]  Take 1 tablet (4 mg total) by mouth every 8 (eight) hours as needed for up to 14 days for nausea or vomiting. Oliver Barre, MD  Active   oxyCODONE (ROXICODONE) 5 MG immediate release tablet [id number]  Take 1 tablet (5 mg total) by mouth every 4 (four) hours as needed for up to 7 days. Oliver Barre, MD  Active   potassium chloride (KLOR-CON) 10 MEQ CR tablet [id number] No Take 10 mEq by mouth 2 (two) times daily. [provider] 09/07/2022 Active Self  PROAIR HFA 108 (90 Base) MCG/ACT inhaler [id number] No Inhale 2 puffs into the lungs every 6 (six) hours as needed for wheezing or shortness of breath. [provider] 09/08/2022 0330 Active Self           Med Note [place] Ouch, AMBER C   Fri Jul 27, 2015 10:22 AM)  Home Care and Equipment/Supplies: Were Home Health Services Ordered?: Yes Any new equipment or medical supplies ordered?: NA  Functional Questionnaire: Do you need assistance with bathing/showering or dressing?: Yes Do you need assistance with meal preparation?: Yes Do you need assistance with eating?: No Do you have difficulty maintaining continence: No Do you need assistance with getting out of bed/getting out  of a chair/moving?: Yes Do you have difficulty managing or taking your medications?: No  Follow up appointments reviewed: PCP Follow-up appointment confirmed?: NA Specialist Hospital Follow-up appointment confirmed?: Yes Date of Specialist follow-up appointment?: 09/24/22 Follow-Up Specialty Provider:: Ortho Do you need transportation to your follow-up appointment?: No Do you understand care options if your condition(s) worsen?: Yes-patient verbalized understanding    SIGNATURE Karena Addison, LPN [facility] Nurse Health Advisor Direct Dial [phone]

## 2022-09-11 ENCOUNTER — Encounter (HOSPITAL_COMMUNITY): Payer: Self-pay | Admitting: Orthopedic Surgery

## 2022-09-19 ENCOUNTER — Ambulatory Visit (INDEPENDENT_AMBULATORY_CARE_PROVIDER_SITE_OTHER): Payer: 59 | Admitting: Orthopedic Surgery

## 2022-09-19 ENCOUNTER — Other Ambulatory Visit (INDEPENDENT_AMBULATORY_CARE_PROVIDER_SITE_OTHER): Payer: 59

## 2022-09-19 ENCOUNTER — Telehealth: Payer: Self-pay | Admitting: Orthopedic Surgery

## 2022-09-19 ENCOUNTER — Encounter: Payer: Self-pay | Admitting: Orthopedic Surgery

## 2022-09-19 DIAGNOSIS — M810 Age-related osteoporosis without current pathological fracture: Secondary | ICD-10-CM | POA: Diagnosis not present

## 2022-09-19 DIAGNOSIS — Z96612 Presence of left artificial shoulder joint: Secondary | ICD-10-CM

## 2022-09-19 DIAGNOSIS — E785 Hyperlipidemia, unspecified: Secondary | ICD-10-CM | POA: Diagnosis not present

## 2022-09-19 DIAGNOSIS — Z471 Aftercare following joint replacement surgery: Secondary | ICD-10-CM | POA: Diagnosis not present

## 2022-09-19 DIAGNOSIS — K219 Gastro-esophageal reflux disease without esophagitis: Secondary | ICD-10-CM | POA: Diagnosis not present

## 2022-09-19 DIAGNOSIS — I10 Essential (primary) hypertension: Secondary | ICD-10-CM | POA: Diagnosis not present

## 2022-09-19 DIAGNOSIS — R7303 Prediabetes: Secondary | ICD-10-CM | POA: Diagnosis not present

## 2022-09-19 DIAGNOSIS — Z791 Long term (current) use of non-steroidal anti-inflammatories (NSAID): Secondary | ICD-10-CM | POA: Diagnosis not present

## 2022-09-19 NOTE — Progress Notes (Signed)
Orthopaedic Postop Note  Assessment: Leah Lynch is a 77 y.o. female s/p Left Reverse Shoulder Arthroplasty  DOS: 09/08/2022  Plan: Sutures were trimmed, steri strips were placed Ok to remove the abduction pillow; can stop using the sling around 4 weeks postop Physical therapy prescription and protocol provided; stressed the importance of initiating movement with home health physical therapy. Demonstrated some simple exercises to initiate immediately.  Passive range of motion is okay. Anticipated progression discussed, XR reviewed in clinic Follow up 4 weeks     Follow-up: Return in about 4 weeks (around 10/17/2022).  XR at next visit: Left shoulder  Subjective:  Chief Complaint  Patient presents with   Routine Post Op    L shoulder DOS 09/08/22    History of Present Illness: Leah Lynch is a 77 y.o. female who presents following the above stated procedure.  Surgery was approximately 2 weeks ago.  She is doing well.  She is not taking any additional pain medications.  She has not started working with physical therapy.  No fevers or chills.  She did have a reaction to the dressing, but is doing well.  She is working to schedule the start of home health PT.  Review of Systems: No fevers or chills No numbness or tingling No Chest Pain No shortness of breath   Objective: There were no vitals taken for this visit.  Physical Exam:  Alert and oriented.  No acute distress.  Anterior based surgical incision is healing well.  No surrounding erythema or drainage.  Mild amount of swelling.  Sensation intact in the axillary nerve distribution.  She has good grip strength.  Sensation intact throughout the left hand.  2+ radial pulse.  Passive abduction to 70 degrees.  Passive forward flexion to 70 degrees.  IMAGING: I personally ordered and reviewed the following images:  XR of the left shoulder was obtained in clinic today and demonstrates a reverse shoulder  arthroplasty with implants in good position.  No evidence of acute injury or subsidence of implants.  No fractures.  Shoulder joint is reduced.  Impression: Left reverse shoulder arthroplasty in good position   Oliver Barre, MD 09/19/2022 11:52 AM

## 2022-09-19 NOTE — Telephone Encounter (Signed)
I called Leah Lynch and advised orders are ok.

## 2022-09-19 NOTE — Telephone Encounter (Signed)
Dr. Durel Salts pt - Misty Stanley PT w/[facility] [phone] lvm stating that she is w/the pt now getting her PT started.  She is requesting verbal orders for 1W1, 2W5.

## 2022-09-19 NOTE — Patient Instructions (Signed)
He is set up with home health physical therapy.  You must get your left shoulder moving.  Okay to shower.  Do not scrub the incision.  Pat the incision dry.  Follow-up in 1 month.  Can stop using the pillow.  Keep using the sling for the next 2 weeks.

## 2022-09-22 DIAGNOSIS — Z471 Aftercare following joint replacement surgery: Secondary | ICD-10-CM | POA: Diagnosis not present

## 2022-09-22 DIAGNOSIS — E785 Hyperlipidemia, unspecified: Secondary | ICD-10-CM | POA: Diagnosis not present

## 2022-09-22 DIAGNOSIS — K219 Gastro-esophageal reflux disease without esophagitis: Secondary | ICD-10-CM | POA: Diagnosis not present

## 2022-09-22 DIAGNOSIS — R7303 Prediabetes: Secondary | ICD-10-CM | POA: Diagnosis not present

## 2022-09-22 DIAGNOSIS — Z791 Long term (current) use of non-steroidal anti-inflammatories (NSAID): Secondary | ICD-10-CM | POA: Diagnosis not present

## 2022-09-22 DIAGNOSIS — M810 Age-related osteoporosis without current pathological fracture: Secondary | ICD-10-CM | POA: Diagnosis not present

## 2022-09-22 DIAGNOSIS — Z96612 Presence of left artificial shoulder joint: Secondary | ICD-10-CM | POA: Diagnosis not present

## 2022-09-22 DIAGNOSIS — I10 Essential (primary) hypertension: Secondary | ICD-10-CM | POA: Diagnosis not present

## 2022-09-24 DIAGNOSIS — E785 Hyperlipidemia, unspecified: Secondary | ICD-10-CM | POA: Diagnosis not present

## 2022-09-24 DIAGNOSIS — I10 Essential (primary) hypertension: Secondary | ICD-10-CM | POA: Diagnosis not present

## 2022-09-24 DIAGNOSIS — R7303 Prediabetes: Secondary | ICD-10-CM | POA: Diagnosis not present

## 2022-09-24 DIAGNOSIS — Z791 Long term (current) use of non-steroidal anti-inflammatories (NSAID): Secondary | ICD-10-CM | POA: Diagnosis not present

## 2022-09-24 DIAGNOSIS — Z471 Aftercare following joint replacement surgery: Secondary | ICD-10-CM | POA: Diagnosis not present

## 2022-09-24 DIAGNOSIS — M810 Age-related osteoporosis without current pathological fracture: Secondary | ICD-10-CM | POA: Diagnosis not present

## 2022-09-24 DIAGNOSIS — K219 Gastro-esophageal reflux disease without esophagitis: Secondary | ICD-10-CM | POA: Diagnosis not present

## 2022-09-24 DIAGNOSIS — Z96612 Presence of left artificial shoulder joint: Secondary | ICD-10-CM | POA: Diagnosis not present

## 2022-09-29 DIAGNOSIS — R7303 Prediabetes: Secondary | ICD-10-CM | POA: Diagnosis not present

## 2022-09-29 DIAGNOSIS — M810 Age-related osteoporosis without current pathological fracture: Secondary | ICD-10-CM | POA: Diagnosis not present

## 2022-09-29 DIAGNOSIS — I10 Essential (primary) hypertension: Secondary | ICD-10-CM | POA: Diagnosis not present

## 2022-09-29 DIAGNOSIS — Z96612 Presence of left artificial shoulder joint: Secondary | ICD-10-CM | POA: Diagnosis not present

## 2022-09-29 DIAGNOSIS — Z791 Long term (current) use of non-steroidal anti-inflammatories (NSAID): Secondary | ICD-10-CM | POA: Diagnosis not present

## 2022-09-29 DIAGNOSIS — E785 Hyperlipidemia, unspecified: Secondary | ICD-10-CM | POA: Diagnosis not present

## 2022-09-29 DIAGNOSIS — Z471 Aftercare following joint replacement surgery: Secondary | ICD-10-CM | POA: Diagnosis not present

## 2022-09-29 DIAGNOSIS — K219 Gastro-esophageal reflux disease without esophagitis: Secondary | ICD-10-CM | POA: Diagnosis not present

## 2022-09-30 ENCOUNTER — Encounter: Payer: Self-pay | Admitting: Internal Medicine

## 2022-09-30 ENCOUNTER — Ambulatory Visit (INDEPENDENT_AMBULATORY_CARE_PROVIDER_SITE_OTHER): Payer: 59 | Admitting: Internal Medicine

## 2022-09-30 VITALS — BP 119/67 | HR 63 | Ht 59.0 in | Wt 139.0 lb

## 2022-09-30 DIAGNOSIS — E1169 Type 2 diabetes mellitus with other specified complication: Secondary | ICD-10-CM | POA: Insufficient documentation

## 2022-09-30 DIAGNOSIS — I10 Essential (primary) hypertension: Secondary | ICD-10-CM | POA: Diagnosis not present

## 2022-09-30 DIAGNOSIS — E782 Mixed hyperlipidemia: Secondary | ICD-10-CM

## 2022-09-30 DIAGNOSIS — E119 Type 2 diabetes mellitus without complications: Secondary | ICD-10-CM | POA: Diagnosis not present

## 2022-09-30 MED ORDER — ATORVASTATIN CALCIUM 80 MG PO TABS
80.0000 mg | ORAL_TABLET | Freq: Every day | ORAL | 1 refills | Status: DC
Start: 2022-09-30 — End: 2023-03-19

## 2022-09-30 MED ORDER — LISINOPRIL 2.5 MG PO TABS
2.5000 mg | ORAL_TABLET | Freq: Every day | ORAL | 2 refills | Status: DC
Start: 2022-09-30 — End: 2022-11-19

## 2022-09-30 NOTE — Assessment & Plan Note (Signed)
A1c 6.7 on labs from last month, consistent with diabetes mellitus.  On review of labs from January that were obtained by her previous PCP, A1c was 6.8 at that time.  This is not a new diagnosis.  She is diet controlled. -No indication to start medications to treat diabetes today -Currently on statin therapy -She is in agreement with adding lisinopril 2.5 mg daily for renal protection -Diabetic foot exam completed today -Urine microalbumin/creatinine ratio was checked in January -I have recommended that she follow-up with her ophthalmologist for diabetic eye exam.

## 2022-09-30 NOTE — Assessment & Plan Note (Signed)
Lipid panel updated last month.  LDL 87.  She is currently prescribed atorvastatin 40 mg daily. -Increase atorvastatin 80 mg daily for improved LDL control in the setting of diabetes mellitus

## 2022-09-30 NOTE — Progress Notes (Signed)
Established Patient Office Visit  Subjective   Patient ID: Leah Lynch, female    DOB: 1946-02-18  Age: 77 y.o. MRN: [id number]  Chief Complaint  Patient presents with   Diabetes    Follow up   [name] returns to care today for follow-up of diabetes mellitus.  She was last evaluated by me on 6/12 as a new patient presenting to establish care.  No medication changes were made at that time and baseline labs were ordered.  73-month follow-up was initially arranged, however lab results showed an A1c of 6.7, consistent with diabetes mellitus.  I requested that she return to care in 4 weeks for diabetes management.  In the interim, she underwent left reverse shoulder arthroplasty with Dr. Dallas Schimke on 6/24.  Her postoperative course has been uncomplicated.  There have otherwise been no acute interval events.  [name] reports feeling well today.  She is asymptomatic and has no acute concerns to discuss.  Past Medical History:  Diagnosis Date   Fibrocystic breast    GERD (gastroesophageal reflux disease)    Hyperlipidemia    Hypertension    Osteoarthritis    Osteoporosis    PONV (postoperative nausea and vomiting)    Pre-diabetes    Shortness of breath dyspnea    Past Surgical History:  Procedure Laterality Date   ABDOMINAL HYSTERECTOMY     BIOPSY  04/15/2018   Procedure: BIOPSY;  Surgeon: Corbin Ade, MD;  Location: [facility];  Service: Endoscopy;;  colon   BREAST CYST EXCISION Right    BREAST SURGERY     removal of cyst from right breast-fibrocystsic   BUNIONECTOMY  12/19/2010   Procedure: BUNIONECTOMY;  Surgeon: Dallas Schimke;  Location: [facility];  Service: Orthopedics;  Laterality: Right;  [name], [name] Bunionectomy   BUNIONECTOMY  05/22/2011   Procedure: Arbutus Leas;  Surgeon: Dallas Schimke, DPM;  Location: [facility];  Service: Orthopedics;  Laterality: Left;  [name] Bunionectomy Left Foot   CAPSULOTOMY  01/29/2012   Procedure:  CAPSULOTOMY;  Surgeon: Dallas Schimke, DPM;  Location: [facility];  Service: Orthopedics;  Laterality: Left;   CATARACT EXTRACTION W/PHACO Right 08/09/2015   Procedure: CATARACT EXTRACTION PHACO AND INTRAOCULAR LENS PLACEMENT RIGHT EYE CDE=7.95;  Surgeon: Gemma Payor, MD;  Location: [facility];  Service: Ophthalmology;  Laterality: Right;   CATARACT EXTRACTION W/PHACO Left 08/27/2015   Procedure: CATARACT EXTRACTION PHACO AND INTRAOCULAR LENS PLACEMENT LEFT EYE; CDE:  6.12;  Surgeon: Gemma Payor, MD;  Location: [facility];  Service: Ophthalmology;  Laterality: Left;   COLONOSCOPY  2012   Dr. Jena Gauss: internal hemorrhoids, hyperplastic polyps. Surveillance 2022   COLONOSCOPY WITH PROPOFOL N/A 04/15/2018   one 7 mm polyp at splenic flexure, one 4 mm polyp in rectum, otherwise normal. S/p segmental biopsy:normal. Normal TI   DIAGNOSTIC LAPAROSCOPY     ESOPHAGOGASTRODUODENOSCOPY (EGD) WITH PROPOFOL N/A 04/15/2018   normal   FOOT GANGLION EXCISION     right foot   METATARSAL OSTEOTOMY  05/22/2011   Procedure: METATARSAL OSTEOTOMY;  Surgeon: Dallas Schimke, DPM;  Location: [facility];  Service: Orthopedics;  Laterality: Left;  [name] Osteotomy Left Foot   POLYPECTOMY  04/15/2018   Procedure: POLYPECTOMY;  Surgeon: Corbin Ade, MD;  Location: [facility];  Service: Endoscopy;;  colon   REVERSE SHOULDER ARTHROPLASTY Left 09/08/2022   Procedure: LEFT REVERSE SHOULDER ARTHROPLASTY;  Surgeon: Oliver Barre, MD;  Location: [facility];  Service: Orthopedics;  Laterality: Left;  RNFA NEEDED  TONSILLECTOMY  age 68   TRIGGER FINGER RELEASE Right 01/28/2018   Procedure: RIGHT TRIGGER THUMB RELEASE;  Surgeon: Vickki Hearing, MD;  Location: [facility];  Service: Orthopedics;  Laterality: Right;   TUBAL LIGATION     Social History   Tobacco Use   Smoking status: Former    Current packs/day: 0.00    Average packs/day: 1 pack/day for 47.4 years (47.4 ttl pk-yrs)    Types: Cigarettes    Start date: 03/18/1975     Quit date: 07/27/2022    Years since quitting: 0.1   Smokeless tobacco: Never  Substance Use Topics   Alcohol use: No   Drug use: No   Family History  Problem Relation Age of Onset   Anesthesia problems Neg Hx    Hypotension Neg Hx    Malignant hyperthermia Neg Hx    Pseudochol deficiency Neg Hx    Colon cancer Neg Hx    Allergies  Allergen Reactions   Bee Venom Swelling   Penicillins Hives, Itching and Other (See Comments)    Has patient had a PCN reaction causing immediate rash, facial/tongue/throat swelling, SOB or lightheadedness with hypotension: Yes Has patient had a PCN reaction causing severe rash involving mucus membranes or skin necrosis: No Has patient had a PCN reaction that required hospitalization No Has patient had a PCN reaction occurring within the last 10 years: YES If all of the above answers are "NO", then may proceed with Cephalosporin use.    Latex Itching   Sulfa Antibiotics Itching   Review of Systems  Constitutional:  Negative for chills and fever.  HENT:  Negative for sore throat.   Respiratory:  Negative for cough and shortness of breath.   Cardiovascular:  Negative for chest pain, palpitations and leg swelling.  Gastrointestinal:  Negative for abdominal pain, blood in stool, constipation, diarrhea, nausea and vomiting.  Genitourinary:  Negative for dysuria and hematuria.  Musculoskeletal:  Negative for myalgias.  Skin:  Negative for itching and rash.  Neurological:  Negative for dizziness and headaches.  Psychiatric/Behavioral:  Negative for depression and suicidal ideas.      Objective:     BP 119/67   Pulse 63   Ht 4\' 11"  (1.499 m)   Wt 139 lb (63 kg)   SpO2 91%   BMI 28.07 kg/m  BP Readings from Last 3 Encounters:  09/30/22 119/67  09/09/22 (!) 114/56  09/02/22 126/82   Physical Exam Vitals reviewed.  Constitutional:      General: She is not in acute distress.    Appearance: Normal appearance. She is not toxic-appearing.   HENT:     Head: Normocephalic and atraumatic.     Right Ear: External ear normal.     Left Ear: External ear normal.     Nose: Nose normal. No congestion or rhinorrhea.     Mouth/Throat:     Mouth: Mucous membranes are moist.     Pharynx: Oropharynx is clear. No oropharyngeal exudate or posterior oropharyngeal erythema.  Eyes:     General: No scleral icterus.    Extraocular Movements: Extraocular movements intact.     Conjunctiva/sclera: Conjunctivae normal.     Pupils: Pupils are equal, round, and reactive to light.  Cardiovascular:     Rate and Rhythm: Normal rate and regular rhythm.     Pulses: Normal pulses.     Heart sounds: Normal heart sounds. No murmur heard.    No friction rub. No gallop.  Pulmonary:  Effort: Pulmonary effort is normal.     Breath sounds: Normal breath sounds. No wheezing, rhonchi or rales.  Abdominal:     General: Abdomen is flat. Bowel sounds are normal. There is no distension.     Palpations: Abdomen is soft.     Tenderness: There is no abdominal tenderness.  Musculoskeletal:        General: No swelling.     Cervical back: Normal range of motion.     Right lower leg: No edema.     Left lower leg: No edema.     Comments: Sling in place on left upper extremity  Lymphadenopathy:     Cervical: No cervical adenopathy.  Skin:    General: Skin is warm and dry.     Capillary Refill: Capillary refill takes less than 2 seconds.     Coloration: Skin is not jaundiced.  Neurological:     General: No focal deficit present.     Mental Status: She is alert and oriented to person, place, and time.  Psychiatric:        Mood and Affect: Mood normal.        Behavior: Behavior normal.   Diabetic foot exam was performed.  No deformities or other abnormal visual findings.  Posterior tibialis and dorsalis pulse intact bilaterally.  Intact to touch and monofilament testing bilaterally.    Last CBC Lab Results  Component Value Date   WBC 10.0 08/27/2022    HGB 11.7 (L) 09/08/2022   HCT 37.3 09/08/2022   MCV 88 08/27/2022   MCH 28.1 08/27/2022   RDW 13.3 08/27/2022   PLT 269 08/27/2022   Last metabolic panel Lab Results  Component Value Date   GLUCOSE 108 (H) 08/27/2022   NA 146 (H) 08/27/2022   K 3.9 08/27/2022   CL 104 08/27/2022   CO2 29 08/27/2022   BUN 20 08/27/2022   CREATININE 0.74 08/27/2022   EGFR 83 08/27/2022   CALCIUM 9.5 08/27/2022   PROT 6.6 08/27/2022   ALBUMIN 4.3 08/27/2022   LABGLOB 2.3 08/27/2022   AGRATIO 1.9 08/27/2022   BILITOT 0.2 08/27/2022   ALKPHOS 125 (H) 08/27/2022   AST 26 08/27/2022   ALT 23 08/27/2022   ANIONGAP 7 01/26/2018   Last lipids Lab Results  Component Value Date   CHOL 189 08/27/2022   HDL 76 08/27/2022   LDLCALC 87 08/27/2022   TRIG 152 (H) 08/27/2022   CHOLHDL 2.5 08/27/2022   Last hemoglobin A1c Lab Results  Component Value Date   HGBA1C 6.7 (H) 08/27/2022   Last thyroid functions Lab Results  Component Value Date   TSH 5.080 (H) 08/27/2022   Last vitamin D Lab Results  Component Value Date   VD25OH 34.2 08/27/2022   Last vitamin B12 and Folate Lab Results  Component Value Date   VITAMINB12 852 08/27/2022   FOLATE 16.5 08/27/2022   The 10-year ASCVD risk score (Arnett DK, et al., 2019) is: 40.1%    Assessment & Plan:   Problem List Items Addressed This Visit       Controlled type 2 diabetes mellitus without complication, without long-term current use of insulin (HCC)    A1c 6.7 on labs from last month, consistent with diabetes mellitus.  On review of labs from January that were obtained by her previous PCP, A1c was 6.8 at that time.  This is not a new diagnosis.  She is diet controlled. -No indication to start medications to treat diabetes today -Currently on statin therapy -  She is in agreement with adding lisinopril 2.5 mg daily for renal protection -Diabetic foot exam completed today -Urine microalbumin/creatinine ratio was checked in January -I have  recommended that she follow-up with her ophthalmologist for diabetic eye exam.      Hyperlipidemia - Primary    Lipid panel updated last month.  LDL 87.  She is currently prescribed atorvastatin 40 mg daily. -Increase atorvastatin 80 mg daily for improved LDL control in the setting of diabetes mellitus      Return in about 7 weeks (around 11/19/2022).   Billie Lade, MD

## 2022-09-30 NOTE — Patient Instructions (Signed)
It was a pleasure to see you today.  Thank you for giving [place] the opportunity to be involved in your care.  Below is a brief recap of your visit and next steps.  We will plan to see you again in September.  Summary Increase atorvastatin to 80 mg daily Add lisinopril 2.5 mg daily I recommend scheduling follow up with your eye doctor Follow up as previously scheduled in September

## 2022-10-03 DIAGNOSIS — E785 Hyperlipidemia, unspecified: Secondary | ICD-10-CM | POA: Diagnosis not present

## 2022-10-03 DIAGNOSIS — Z471 Aftercare following joint replacement surgery: Secondary | ICD-10-CM | POA: Diagnosis not present

## 2022-10-03 DIAGNOSIS — I10 Essential (primary) hypertension: Secondary | ICD-10-CM | POA: Diagnosis not present

## 2022-10-03 DIAGNOSIS — M810 Age-related osteoporosis without current pathological fracture: Secondary | ICD-10-CM | POA: Diagnosis not present

## 2022-10-03 DIAGNOSIS — Z96612 Presence of left artificial shoulder joint: Secondary | ICD-10-CM | POA: Diagnosis not present

## 2022-10-03 DIAGNOSIS — R7303 Prediabetes: Secondary | ICD-10-CM | POA: Diagnosis not present

## 2022-10-03 DIAGNOSIS — K219 Gastro-esophageal reflux disease without esophagitis: Secondary | ICD-10-CM | POA: Diagnosis not present

## 2022-10-03 DIAGNOSIS — Z791 Long term (current) use of non-steroidal anti-inflammatories (NSAID): Secondary | ICD-10-CM | POA: Diagnosis not present

## 2022-10-07 DIAGNOSIS — Z96612 Presence of left artificial shoulder joint: Secondary | ICD-10-CM | POA: Diagnosis not present

## 2022-10-07 DIAGNOSIS — R7303 Prediabetes: Secondary | ICD-10-CM | POA: Diagnosis not present

## 2022-10-07 DIAGNOSIS — Z471 Aftercare following joint replacement surgery: Secondary | ICD-10-CM | POA: Diagnosis not present

## 2022-10-07 DIAGNOSIS — K219 Gastro-esophageal reflux disease without esophagitis: Secondary | ICD-10-CM | POA: Diagnosis not present

## 2022-10-07 DIAGNOSIS — I10 Essential (primary) hypertension: Secondary | ICD-10-CM | POA: Diagnosis not present

## 2022-10-07 DIAGNOSIS — E785 Hyperlipidemia, unspecified: Secondary | ICD-10-CM | POA: Diagnosis not present

## 2022-10-07 DIAGNOSIS — Z791 Long term (current) use of non-steroidal anti-inflammatories (NSAID): Secondary | ICD-10-CM | POA: Diagnosis not present

## 2022-10-07 DIAGNOSIS — M810 Age-related osteoporosis without current pathological fracture: Secondary | ICD-10-CM | POA: Diagnosis not present

## 2022-10-09 DIAGNOSIS — R7303 Prediabetes: Secondary | ICD-10-CM | POA: Diagnosis not present

## 2022-10-09 DIAGNOSIS — Z791 Long term (current) use of non-steroidal anti-inflammatories (NSAID): Secondary | ICD-10-CM | POA: Diagnosis not present

## 2022-10-09 DIAGNOSIS — Z96612 Presence of left artificial shoulder joint: Secondary | ICD-10-CM | POA: Diagnosis not present

## 2022-10-09 DIAGNOSIS — M810 Age-related osteoporosis without current pathological fracture: Secondary | ICD-10-CM | POA: Diagnosis not present

## 2022-10-09 DIAGNOSIS — Z471 Aftercare following joint replacement surgery: Secondary | ICD-10-CM | POA: Diagnosis not present

## 2022-10-09 DIAGNOSIS — I10 Essential (primary) hypertension: Secondary | ICD-10-CM | POA: Diagnosis not present

## 2022-10-09 DIAGNOSIS — K219 Gastro-esophageal reflux disease without esophagitis: Secondary | ICD-10-CM | POA: Diagnosis not present

## 2022-10-09 DIAGNOSIS — E785 Hyperlipidemia, unspecified: Secondary | ICD-10-CM | POA: Diagnosis not present

## 2022-10-10 ENCOUNTER — Telehealth: Payer: Self-pay | Admitting: Internal Medicine

## 2022-10-10 DIAGNOSIS — G8929 Other chronic pain: Secondary | ICD-10-CM

## 2022-10-10 DIAGNOSIS — F17210 Nicotine dependence, cigarettes, uncomplicated: Secondary | ICD-10-CM | POA: Diagnosis not present

## 2022-10-10 DIAGNOSIS — I10 Essential (primary) hypertension: Secondary | ICD-10-CM | POA: Diagnosis not present

## 2022-10-10 DIAGNOSIS — Z79891 Long term (current) use of opiate analgesic: Secondary | ICD-10-CM | POA: Diagnosis not present

## 2022-10-10 DIAGNOSIS — M5451 Vertebrogenic low back pain: Secondary | ICD-10-CM | POA: Diagnosis not present

## 2022-10-10 MED ORDER — HYDROCODONE-ACETAMINOPHEN 10-325 MG PO TABS
1.0000 | ORAL_TABLET | Freq: Four times a day (QID) | ORAL | 0 refills | Status: AC | PRN
Start: 2022-10-10 — End: 2022-11-09

## 2022-10-10 NOTE — Telephone Encounter (Signed)
Pt called requesting the Hydrocodone that spoken about during last OV. Please advise.

## 2022-10-12 ENCOUNTER — Encounter: Payer: Self-pay | Admitting: Internal Medicine

## 2022-10-14 DIAGNOSIS — R7303 Prediabetes: Secondary | ICD-10-CM | POA: Diagnosis not present

## 2022-10-14 DIAGNOSIS — E785 Hyperlipidemia, unspecified: Secondary | ICD-10-CM | POA: Diagnosis not present

## 2022-10-14 DIAGNOSIS — I10 Essential (primary) hypertension: Secondary | ICD-10-CM | POA: Diagnosis not present

## 2022-10-14 DIAGNOSIS — Z791 Long term (current) use of non-steroidal anti-inflammatories (NSAID): Secondary | ICD-10-CM | POA: Diagnosis not present

## 2022-10-14 DIAGNOSIS — Z471 Aftercare following joint replacement surgery: Secondary | ICD-10-CM | POA: Diagnosis not present

## 2022-10-14 DIAGNOSIS — M810 Age-related osteoporosis without current pathological fracture: Secondary | ICD-10-CM | POA: Diagnosis not present

## 2022-10-14 DIAGNOSIS — Z96612 Presence of left artificial shoulder joint: Secondary | ICD-10-CM | POA: Diagnosis not present

## 2022-10-14 DIAGNOSIS — K219 Gastro-esophageal reflux disease without esophagitis: Secondary | ICD-10-CM | POA: Diagnosis not present

## 2022-10-17 DIAGNOSIS — R7303 Prediabetes: Secondary | ICD-10-CM | POA: Diagnosis not present

## 2022-10-17 DIAGNOSIS — E785 Hyperlipidemia, unspecified: Secondary | ICD-10-CM | POA: Diagnosis not present

## 2022-10-17 DIAGNOSIS — Z791 Long term (current) use of non-steroidal anti-inflammatories (NSAID): Secondary | ICD-10-CM | POA: Diagnosis not present

## 2022-10-17 DIAGNOSIS — M810 Age-related osteoporosis without current pathological fracture: Secondary | ICD-10-CM | POA: Diagnosis not present

## 2022-10-17 DIAGNOSIS — K219 Gastro-esophageal reflux disease without esophagitis: Secondary | ICD-10-CM | POA: Diagnosis not present

## 2022-10-17 DIAGNOSIS — Z96612 Presence of left artificial shoulder joint: Secondary | ICD-10-CM | POA: Diagnosis not present

## 2022-10-17 DIAGNOSIS — I10 Essential (primary) hypertension: Secondary | ICD-10-CM | POA: Diagnosis not present

## 2022-10-17 DIAGNOSIS — Z471 Aftercare following joint replacement surgery: Secondary | ICD-10-CM | POA: Diagnosis not present

## 2022-10-20 ENCOUNTER — Other Ambulatory Visit: Payer: Self-pay | Admitting: *Deleted

## 2022-10-20 DIAGNOSIS — F17210 Nicotine dependence, cigarettes, uncomplicated: Secondary | ICD-10-CM

## 2022-10-20 DIAGNOSIS — Z87891 Personal history of nicotine dependence: Secondary | ICD-10-CM

## 2022-10-20 DIAGNOSIS — Z122 Encounter for screening for malignant neoplasm of respiratory organs: Secondary | ICD-10-CM

## 2022-10-21 ENCOUNTER — Encounter: Payer: Self-pay | Admitting: Orthopedic Surgery

## 2022-10-21 ENCOUNTER — Ambulatory Visit (INDEPENDENT_AMBULATORY_CARE_PROVIDER_SITE_OTHER): Payer: 59 | Admitting: Orthopedic Surgery

## 2022-10-21 ENCOUNTER — Other Ambulatory Visit (INDEPENDENT_AMBULATORY_CARE_PROVIDER_SITE_OTHER): Payer: 59

## 2022-10-21 ENCOUNTER — Telehealth: Payer: Self-pay | Admitting: Orthopedic Surgery

## 2022-10-21 VITALS — BP 117/67 | HR 80

## 2022-10-21 DIAGNOSIS — M12812 Other specific arthropathies, not elsewhere classified, left shoulder: Secondary | ICD-10-CM

## 2022-10-21 DIAGNOSIS — Z96612 Presence of left artificial shoulder joint: Secondary | ICD-10-CM

## 2022-10-21 NOTE — Progress Notes (Signed)
Orthopaedic Postop Note  Assessment: Leah Lynch is a 77 y.o. female s/p Left Reverse Shoulder Arthroplasty  DOS: 09/08/2022  Plan: Leah Lynch is progressing appropriately.  Radiographs are stable.  Continue to work with home health physical therapy.  Stress passive range of motion.  As motion improves, anticipate that her pain will continue to get better.  I stressed that this is a 1-month full recovery.  She states understanding.  Will see her back in 6 weeks.    Follow-up: Return in about 6 weeks (around 12/02/2022).  XR at next visit: Left shoulder  Subjective:  Chief Complaint  Patient presents with   Post-op Follow-up    Left shoulder 09/08/22     History of Present Illness: Leah Lynch is a 77 y.o. female who presents following the above stated procedure.  Surgery was approximately 6 weeks ago.  She is progressing appropriately.  She has been working with home health physical therapy.  Her motion is improving.  She feels better.  She is no longer using the sling.  No numbness or tingling.   Review of Systems: No fevers or chills No numbness or tingling No Chest Pain No shortness of breath   Objective: BP 117/67   Pulse 80   Physical Exam:  Alert and oriented.  No acute distress.  Anterior base surgical incision is healed.  No surrounding erythema or drainage.  Sensation is intact throughout the left upper extremity.  120 degrees of passive forward flexion.  90 degrees of abduction at her side.  Fingers are warm and well-perfused.   IMAGING: I personally ordered and reviewed the following images:  X-rays left shoulder obtained in clinic today.  He is compared to prior x-rays.  Well-positioned reverse shoulder arthroplasty.  No subsidence of the implants.  No periprosthetic lucency.  Shoulder joint is reduced.  No acute fractures.  Impression: Left reverse shoulder arthroplasty in stable position   Oliver Barre, MD 10/21/2022 11:34 AM

## 2022-10-21 NOTE — Telephone Encounter (Signed)
Patient called and LVM stating she forgot to make her appt.  I called the patient back and her number is not in service now.  She was just here this morning and verified it. I made the appt for her and mailed her a reminder letter and a card as well in the mail

## 2022-10-23 DIAGNOSIS — M810 Age-related osteoporosis without current pathological fracture: Secondary | ICD-10-CM | POA: Diagnosis not present

## 2022-10-23 DIAGNOSIS — Z471 Aftercare following joint replacement surgery: Secondary | ICD-10-CM | POA: Diagnosis not present

## 2022-10-23 DIAGNOSIS — K219 Gastro-esophageal reflux disease without esophagitis: Secondary | ICD-10-CM | POA: Diagnosis not present

## 2022-10-23 DIAGNOSIS — I10 Essential (primary) hypertension: Secondary | ICD-10-CM | POA: Diagnosis not present

## 2022-10-23 DIAGNOSIS — Z791 Long term (current) use of non-steroidal anti-inflammatories (NSAID): Secondary | ICD-10-CM | POA: Diagnosis not present

## 2022-10-23 DIAGNOSIS — E785 Hyperlipidemia, unspecified: Secondary | ICD-10-CM | POA: Diagnosis not present

## 2022-10-23 DIAGNOSIS — Z96612 Presence of left artificial shoulder joint: Secondary | ICD-10-CM | POA: Diagnosis not present

## 2022-10-23 DIAGNOSIS — R7303 Prediabetes: Secondary | ICD-10-CM | POA: Diagnosis not present

## 2022-10-24 ENCOUNTER — Encounter: Payer: Self-pay | Admitting: Orthopedic Surgery

## 2022-10-24 ENCOUNTER — Telehealth: Payer: Self-pay | Admitting: Orthopedic Surgery

## 2022-10-24 DIAGNOSIS — Z96612 Presence of left artificial shoulder joint: Secondary | ICD-10-CM

## 2022-10-24 DIAGNOSIS — I10 Essential (primary) hypertension: Secondary | ICD-10-CM | POA: Diagnosis not present

## 2022-10-24 DIAGNOSIS — R7303 Prediabetes: Secondary | ICD-10-CM | POA: Diagnosis not present

## 2022-10-24 DIAGNOSIS — E785 Hyperlipidemia, unspecified: Secondary | ICD-10-CM | POA: Diagnosis not present

## 2022-10-24 DIAGNOSIS — Z791 Long term (current) use of non-steroidal anti-inflammatories (NSAID): Secondary | ICD-10-CM | POA: Diagnosis not present

## 2022-10-24 DIAGNOSIS — Z471 Aftercare following joint replacement surgery: Secondary | ICD-10-CM | POA: Diagnosis not present

## 2022-10-24 DIAGNOSIS — M810 Age-related osteoporosis without current pathological fracture: Secondary | ICD-10-CM | POA: Diagnosis not present

## 2022-10-24 DIAGNOSIS — K219 Gastro-esophageal reflux disease without esophagitis: Secondary | ICD-10-CM | POA: Diagnosis not present

## 2022-10-24 NOTE — Telephone Encounter (Signed)
Dr. Dallas Schimke pt - Leah Lynch w/[facility] [phone] lvm stating today is the patients scheduled discharge for PT.  She would like an order sent to [facility] clinic for the patient to go to OP PT.

## 2022-10-26 NOTE — Telephone Encounter (Signed)
Order entered

## 2022-10-27 ENCOUNTER — Other Ambulatory Visit: Payer: Self-pay | Admitting: Radiology

## 2022-11-04 ENCOUNTER — Other Ambulatory Visit: Payer: Self-pay

## 2022-11-04 ENCOUNTER — Encounter (HOSPITAL_COMMUNITY): Payer: Self-pay | Admitting: Occupational Therapy

## 2022-11-04 ENCOUNTER — Ambulatory Visit (HOSPITAL_COMMUNITY): Payer: 59 | Attending: Orthopedic Surgery | Admitting: Occupational Therapy

## 2022-11-04 DIAGNOSIS — M25612 Stiffness of left shoulder, not elsewhere classified: Secondary | ICD-10-CM | POA: Insufficient documentation

## 2022-11-04 DIAGNOSIS — R29898 Other symptoms and signs involving the musculoskeletal system: Secondary | ICD-10-CM | POA: Diagnosis not present

## 2022-11-04 DIAGNOSIS — Z96612 Presence of left artificial shoulder joint: Secondary | ICD-10-CM | POA: Insufficient documentation

## 2022-11-04 DIAGNOSIS — M25512 Pain in left shoulder: Secondary | ICD-10-CM | POA: Insufficient documentation

## 2022-11-04 NOTE — Patient Instructions (Signed)

## 2022-11-04 NOTE — Therapy (Signed)
OUTPATIENT OCCUPATIONAL THERAPY ORTHO EVALUATION  Patient Name: Leah Lynch MRN: [id number] DOB:1946/03/08, 77 y.o., female Today's Date: 11/04/2022   END OF SESSION:  OT End of Session - 11/04/22 1153     Visit Number 1    Number of Visits 8    Date for OT Re-Evaluation 12/04/22    Authorization Type [employer]    Authorization Time Period no visit limit    Progress Note Due on Visit 10    OT Start Time 1113    OT Stop Time 1152    OT Time Calculation (min) 39 min    Activity Tolerance Patient tolerated treatment well    Behavior During Therapy WFL for tasks assessed/performed             Past Medical History:  Diagnosis Date   Fibrocystic breast    GERD (gastroesophageal reflux disease)    Hyperlipidemia    Hypertension    Osteoarthritis    Osteoporosis    PONV (postoperative nausea and vomiting)    Pre-diabetes    Shortness of breath dyspnea    Past Surgical History:  Procedure Laterality Date   ABDOMINAL HYSTERECTOMY     BIOPSY  04/15/2018   Procedure: BIOPSY;  Surgeon: Corbin Ade, MD;  [facility]: [facility];  Service: Endoscopy;;  colon   BREAST CYST EXCISION Right    BREAST SURGERY     removal of cyst from right breast-fibrocystsic   BUNIONECTOMY  12/19/2010   Procedure: BUNIONECTOMY;  Surgeon: Dallas Schimke;  Location: [facility];  Service: Orthopedics;  Laterality: Right;  [name], [name] Bunionectomy   BUNIONECTOMY  05/22/2011   Procedure: Arbutus Leas;  Surgeon: Dallas Schimke, DPM;  Location: [facility];  Service: Orthopedics;  Laterality: Left;  [name] Bunionectomy Left Foot   CAPSULOTOMY  01/29/2012   Procedure: CAPSULOTOMY;  Surgeon: Dallas Schimke, DPM;  Location: [facility];  Service: Orthopedics;  Laterality: Left;   CATARACT EXTRACTION W/PHACO Right 08/09/2015   Procedure: CATARACT EXTRACTION PHACO AND INTRAOCULAR LENS PLACEMENT RIGHT EYE CDE=7.95;  Surgeon: Gemma Payor, MD;  Location: [facility];  Service:  Ophthalmology;  Laterality: Right;   CATARACT EXTRACTION W/PHACO Left 08/27/2015   Procedure: CATARACT EXTRACTION PHACO AND INTRAOCULAR LENS PLACEMENT LEFT EYE; CDE:  6.12;  Surgeon: Gemma Payor, MD;  Location: [facility];  Service: Ophthalmology;  Laterality: Left;   COLONOSCOPY  2012   Dr. Jena Gauss: internal hemorrhoids, hyperplastic polyps. Surveillance 2022   COLONOSCOPY WITH PROPOFOL N/A 04/15/2018   one 7 mm polyp at splenic flexure, one 4 mm polyp in rectum, otherwise normal. S/p segmental biopsy:normal. Normal TI   DIAGNOSTIC LAPAROSCOPY     ESOPHAGOGASTRODUODENOSCOPY (EGD) WITH PROPOFOL N/A 04/15/2018   normal   FOOT GANGLION EXCISION     right foot   METATARSAL OSTEOTOMY  05/22/2011   Procedure: METATARSAL OSTEOTOMY;  Surgeon: Dallas Schimke, DPM;  Location: [facility];  Service: Orthopedics;  Laterality: Left;  [name] Osteotomy Left Foot   POLYPECTOMY  04/15/2018   Procedure: POLYPECTOMY;  Surgeon: Corbin Ade, MD;  [facility]: [facility];  Service: Endoscopy;;  colon   REVERSE SHOULDER ARTHROPLASTY Left 09/08/2022   Procedure: LEFT REVERSE SHOULDER ARTHROPLASTY;  Surgeon: Oliver Barre, MD;  Location: [facility];  Service: Orthopedics;  Laterality: Left;  RNFA NEEDED   TONSILLECTOMY  age 34   TRIGGER FINGER RELEASE Right 01/28/2018   Procedure: RIGHT TRIGGER THUMB RELEASE;  Surgeon: Vickki Hearing, MD;  Location: [facility];  Service: Orthopedics;  Laterality: Right;   TUBAL LIGATION     Patient Active Problem List   Diagnosis Date Noted   Controlled type 2 diabetes mellitus without complication, without long-term current use of insulin (HCC) 09/30/2022   Rotator cuff arthropathy of left shoulder 09/08/2022   Essential hypertension 09/03/2022   GERD (gastroesophageal reflux disease) 09/03/2022   Hyperlipidemia 09/03/2022   Osteoporosis 09/03/2022   Chronic musculoskeletal pain 09/03/2022   Rotator cuff arthropathy, left 09/03/2022   Screening for lung cancer 09/03/2022    Early satiety 02/22/2018   Loss of weight 02/22/2018   S/P trigger finger release right thumb 11/141/9 02/08/2018   Diarrhea 11/12/2016   Abdominal pain 11/12/2016   Nausea without vomiting 11/12/2016    PCP: Dr. Christel Mormon REFERRING PROVIDER: Dr. Thane Edu  ONSET DATE: 09/08/22  REFERRING DIAG: s/p left reverse TSA  THERAPY DIAG:  Acute pain of left shoulder  Other symptoms and signs involving the musculoskeletal system  Stiffness of left shoulder, not elsewhere classified  Rationale for Evaluation and Treatment: Rehabilitation  SUBJECTIVE:   SUBJECTIVE STATEMENT: S: The last couple of days have been painful from the weather.  Pt accompanied by: self  PERTINENT HISTORY: Pt is a 77 y/o female s/p left reverse TSA on 09/08/22. Pt received HH therapy for several weeks, stopped approximately 1 week ago.   PRECAUTIONS: Shoulder See Protocol  WEIGHT BEARING RESTRICTIONS: Yes NWB  PAIN:  Are you having pain? No  FALLS: Has patient fallen in last 6 months? Yes. Number of falls 1  PLOF: Independent  PATIENT GOALS: To be able to use the LUE.   NEXT MD VISIT: 12/02/22  OBJECTIVE:   HAND DOMINANCE: Right  ADLs: Overall ADLs: Pt reports she is having difficulty moving her arm for most tasks. Pt has difficulty with reaching up overhead and behind her back. She is able to dress herself, is unable to reach back behind her for pulling pants or underwear over hips. Pt is not lifting anything of weight, does not lift pots and pans. Pt is sleeping in the bed, on average sleeps ok, has difficulty when she is hurting.   FUNCTIONAL OUTCOME MEASURES: FOTO: 50/100  UPPER EXTREMITY ROM:       Assessed in supine, er/IR adducted  Passive ROM Left eval  Shoulder flexion 120  Shoulder abduction 142  Shoulder internal rotation 90  Shoulder external rotation 35  (Blank rows = not tested)  Assessed in seated, er/IR adducted  Active ROM Left eval  Shoulder flexion 102   Shoulder abduction 76  Shoulder internal rotation 90  Shoulder external rotation 0  (Blank rows = not tested)    UPPER EXTREMITY MMT:     Assessed in sitting, er/IR adducted-via observation on eval   MMT Left eval  Shoulder flexion 3-/5  Shoulder abduction 3-/5  Shoulder internal rotation 3/5  Shoulder external rotation 3-/5  (Blank rows = not tested)  SENSATION: WFL  EDEMA: occasional with weather changes  COGNITION: Overall cognitive status: Within functional limits for tasks assessed  OBSERVATIONS: mod fascial restrictions along upper arm, anterior shoulder, and trapezius regions   TODAY'S TREATMENT:  DATE:  11/04/22 -AA/ROM: seated-protraction, flexion, abduction, er/IR, horizontal abduction, 5 reps    PATIENT EDUCATION: Education details: AA/ROM Person educated: Patient Education method: Programmer, multimedia, Facilities manager, and Handouts Education comprehension: verbalized understanding and returned demonstration  HOME EXERCISE PROGRAM: Eval: AA/ROM  GOALS: Goals reviewed with patient? Yes   SHORT TERM GOALS: Target date: 12/05/22  Pt will be provided with and educated on HEP to improve mobility in LUE required for use during ADL completion.   Goal status: INITIAL  2.  Pt will increase LUE A/ROM by 30+ degrees to improve ability to use LUE during dressing tasks with minimal compensatory techniques.   Goal status: INITIAL  3.  Pt will increase LUE strength to 4/5 to improve ability to lift and move pots and pans during meal preparation and cleanup tasks.   Goal status: INITIAL  4.  Pt will decrease pain in LUE to 3/10 or less to improve ability to sleep for 2+ consecutive hours without waking due to pain  Goal status: INITIAL  5.  Pt will decrease LUE fascial restrictions to min amounts or less to improve mobility required for  functional reaching tasks.      Goal status: INITIAL   ASSESSMENT:  CLINICAL IMPRESSION: Patient is a 77 y.o. female who was seen today for occupational therapy evaluation s/p left reverse TSA on 09/08/22. Pt presents with increased pain and fascial restrictions, decreased ROM, strength, and functional use of the LUE during ADL tasks. Pt has been completing HEP given by [facility] therapist. Educated on AA/ROM today.   PERFORMANCE DEFICITS: in functional skills including in functional skills including ADLs, IADLs, coordination, tone, ROM, strength, pain, fascial restrictions, muscle spasms, and UE functional use,   IMPAIRMENTS: are limiting patient from ADLs, IADLs, rest and sleep, and leisure.   COMORBIDITIES: has no other co-morbidities that affects occupational performance. Patient will benefit from skilled OT to address above impairments and improve overall function.  MODIFICATION OR ASSISTANCE TO COMPLETE EVALUATION: No modification of tasks or assist necessary to complete an evaluation.  OT OCCUPATIONAL PROFILE AND HISTORY: Problem focused assessment: Including review of records relating to presenting problem.  CLINICAL DECISION MAKING: LOW - limited treatment options, no task modification necessary  REHAB POTENTIAL: Good  EVALUATION COMPLEXITY: Low      PLAN:  OT FREQUENCY: 2x/week  OT DURATION: 4 weeks  PLANNED INTERVENTIONS: self care/ADL training, therapeutic exercise, therapeutic activity, neuromuscular re-education, manual therapy, passive range of motion, splinting, electrical stimulation, ultrasound, moist heat, cryotherapy, patient/family education, and DME and/or AE instructions  RECOMMENDED OTHER SERVICES: None at this time  CONSULTED AND AGREED WITH PLAN OF CARE: Patient  PLAN FOR NEXT SESSION: Follow up on HEP, initiate manual techniques, passive stretching, AA/ROM progressed to A/ROM   Ezra Sites, OTR/L  [phone] 11/04/2022, 11:55 AM

## 2022-11-11 ENCOUNTER — Ambulatory Visit (HOSPITAL_COMMUNITY): Payer: 59 | Admitting: Occupational Therapy

## 2022-11-11 ENCOUNTER — Encounter (HOSPITAL_COMMUNITY): Payer: Self-pay | Admitting: Occupational Therapy

## 2022-11-11 DIAGNOSIS — M25612 Stiffness of left shoulder, not elsewhere classified: Secondary | ICD-10-CM | POA: Diagnosis not present

## 2022-11-11 DIAGNOSIS — M25512 Pain in left shoulder: Secondary | ICD-10-CM | POA: Diagnosis not present

## 2022-11-11 DIAGNOSIS — R29898 Other symptoms and signs involving the musculoskeletal system: Secondary | ICD-10-CM

## 2022-11-11 DIAGNOSIS — Z96612 Presence of left artificial shoulder joint: Secondary | ICD-10-CM | POA: Diagnosis not present

## 2022-11-11 NOTE — Therapy (Signed)
OUTPATIENT OCCUPATIONAL THERAPY ORTHO TREATMENT NOTE  Patient Name: Leah Lynch MRN: [id number] DOB:Oct 02, 1945, 77 y.o., female Today's Date: 11/11/2022   END OF SESSION:  OT End of Session - 11/11/22 1334     Visit Number 2    Number of Visits 8    Date for OT Re-Evaluation 12/04/22    Authorization Type [employer] Medicare    Authorization Time Period no visit limit    Progress Note Due on Visit 10    OT Start Time 1120    OT Stop Time 1201    OT Time Calculation (min) 41 min    Activity Tolerance Patient tolerated treatment well    Behavior During Therapy WFL for tasks assessed/performed              Past Medical History:  Diagnosis Date   Fibrocystic breast    GERD (gastroesophageal reflux disease)    Hyperlipidemia    Hypertension    Osteoarthritis    Osteoporosis    PONV (postoperative nausea and vomiting)    Pre-diabetes    Shortness of breath dyspnea    Past Surgical History:  Procedure Laterality Date   ABDOMINAL HYSTERECTOMY     BIOPSY  04/15/2018   Procedure: BIOPSY;  Surgeon: Corbin Ade, MD;  [facility];  Service: Endoscopy;;  colon   BREAST CYST EXCISION Right    BREAST SURGERY     removal of cyst from right breast-fibrocystsic   BUNIONECTOMY  12/19/2010   Procedure: BUNIONECTOMY;  Surgeon: Dallas Schimke;  Location: [facility];  Service: Orthopedics;  Laterality: Right;  [name], [name] Bunionectomy   BUNIONECTOMY  05/22/2011   Procedure: Arbutus Leas;  Surgeon: Dallas Schimke, DPM;  Location: [facility];  Service: Orthopedics;  Laterality: Left;  [name] Bunionectomy Left Foot   CAPSULOTOMY  01/29/2012   Procedure: CAPSULOTOMY;  Surgeon: Dallas Schimke, DPM;  Location: [facility];  Service: Orthopedics;  Laterality: Left;   CATARACT EXTRACTION W/PHACO Right 08/09/2015   Procedure: CATARACT EXTRACTION PHACO AND INTRAOCULAR LENS PLACEMENT RIGHT EYE CDE=7.95;  Surgeon: Gemma Payor, MD;  Location: [facility];   Service: Ophthalmology;  Laterality: Right;   CATARACT EXTRACTION W/PHACO Left 08/27/2015   Procedure: CATARACT EXTRACTION PHACO AND INTRAOCULAR LENS PLACEMENT LEFT EYE; CDE:  6.12;  Surgeon: Gemma Payor, MD;  Location: [facility];  Service: Ophthalmology;  Laterality: Left;   COLONOSCOPY  2012   Dr. Jena Gauss: internal hemorrhoids, hyperplastic polyps. Surveillance 2022   COLONOSCOPY WITH PROPOFOL N/A 04/15/2018   one 7 mm polyp at splenic flexure, one 4 mm polyp in rectum, otherwise normal. S/p segmental biopsy:normal. Normal TI   DIAGNOSTIC LAPAROSCOPY     ESOPHAGOGASTRODUODENOSCOPY (EGD) WITH PROPOFOL N/A 04/15/2018   normal   FOOT GANGLION EXCISION     right foot   METATARSAL OSTEOTOMY  05/22/2011   Procedure: METATARSAL OSTEOTOMY;  Surgeon: Dallas Schimke, DPM;  Location: [facility];  Service: Orthopedics;  Laterality: Left;  [name] Osteotomy Left Foot   POLYPECTOMY  04/15/2018   Procedure: POLYPECTOMY;  Surgeon: Corbin Ade, MD;  [facility];  Service: Endoscopy;;  colon   REVERSE SHOULDER ARTHROPLASTY Left 09/08/2022   Procedure: LEFT REVERSE SHOULDER ARTHROPLASTY;  Surgeon: Oliver Barre, MD;  Location: [facility];  Service: Orthopedics;  Laterality: Left;  RNFA NEEDED   TONSILLECTOMY  age 49   TRIGGER FINGER RELEASE Right 01/28/2018   Procedure: RIGHT TRIGGER THUMB RELEASE;  Surgeon: Vickki Hearing, MD;  Location: [facility];  Service: Orthopedics;  Laterality: Right;   TUBAL LIGATION     Patient Active Problem List   Diagnosis Date Noted   Controlled type 2 diabetes mellitus without complication, without long-term current use of insulin (HCC) 09/30/2022   Rotator cuff arthropathy of left shoulder 09/08/2022   Essential hypertension 09/03/2022   GERD (gastroesophageal reflux disease) 09/03/2022   Hyperlipidemia 09/03/2022   Osteoporosis 09/03/2022   Chronic musculoskeletal pain 09/03/2022   Rotator cuff arthropathy, left 09/03/2022   Screening for lung cancer  09/03/2022   Early satiety 02/22/2018   Loss of weight 02/22/2018   S/P trigger finger release right thumb 11/141/9 02/08/2018   Diarrhea 11/12/2016   Abdominal pain 11/12/2016   Nausea without vomiting 11/12/2016    PCP: Dr. Christel Mormon REFERRING PROVIDER: Dr. Thane Edu  ONSET DATE: 09/08/22  REFERRING DIAG: s/p left reverse TSA  THERAPY DIAG:  Acute pain of left shoulder  Other symptoms and signs involving the musculoskeletal system  Stiffness of left shoulder, not elsewhere classified  Rationale for Evaluation and Treatment: Rehabilitation  SUBJECTIVE:   SUBJECTIVE STATEMENT: S: It has been really sore. Pt accompanied by: self  PERTINENT HISTORY: Pt is a 77 y/o female s/p left reverse TSA on 09/08/22. Pt received HH therapy for several weeks, stopped approximately 1 week ago.   PRECAUTIONS: Shoulder See Protocol  WEIGHT BEARING RESTRICTIONS: Yes NWB  PAIN:  Are you having pain? No and I do get 6/10+ pain when moving it  FALLS: Has patient fallen in last 6 months? Yes. Number of falls 1  PLOF: Independent  PATIENT GOALS: To be able to use the LUE.   NEXT MD VISIT: 12/02/22  OBJECTIVE:   HAND DOMINANCE: Right  ADLs: Overall ADLs: Pt reports she is having difficulty moving her arm for most tasks. Pt has difficulty with reaching up overhead and behind her back. She is able to dress herself, is unable to reach back behind her for pulling pants or underwear over hips. Pt is not lifting anything of weight, does not lift pots and pans. Pt is sleeping in the bed, on average sleeps ok, has difficulty when she is hurting.   FUNCTIONAL OUTCOME MEASURES: FOTO: 50/100  UPPER EXTREMITY ROM:       Assessed in supine, er/IR adducted  Passive ROM Left eval  Shoulder flexion 120  Shoulder abduction 142  Shoulder internal rotation 90  Shoulder external rotation 35  (Blank rows = not tested)  Assessed in seated, er/IR adducted  Active ROM Left eval  Shoulder  flexion 102  Shoulder abduction 76  Shoulder internal rotation 90  Shoulder external rotation 0  (Blank rows = not tested)    UPPER EXTREMITY MMT:     Assessed in sitting, er/IR adducted-via observation on eval   MMT Left eval  Shoulder flexion 3-/5  Shoulder abduction 3-/5  Shoulder internal rotation 3/5  Shoulder external rotation 3-/5  (Blank rows = not tested)  SENSATION: WFL  EDEMA: occasional with weather changes  COGNITION: Overall cognitive status: Within functional limits for tasks assessed  OBSERVATIONS: mod fascial restrictions along upper arm, anterior shoulder, and trapezius regions   TODAY'S TREATMENT:  DATE:   11/11/22 -Manual Therapy: myofascial release and trigger point applied to the bicep, deltoid, trapezius, scapular region and axillary region in order to reduce fascial restrictions and pain in order to improve ROM.  -P/ROM: flexion, abduction, er/IR, x10 -AA/ROM: supine, flexion, abduction, protraction, horizontal abduction, er/IR, x10 -Counter slides: flexion, x10  11/04/22 -AA/ROM: seated-protraction, flexion, abduction, er/IR, horizontal abduction, 5 reps    PATIENT EDUCATION: Education details: AA/ROM Person educated: Patient Education method: Programmer, multimedia, Facilities manager, and Handouts Education comprehension: verbalized understanding and returned demonstration  HOME EXERCISE PROGRAM: Eval: AA/ROM  GOALS: Goals reviewed with patient? Yes   SHORT TERM GOALS: Target date: 12/05/22  Pt will be provided with and educated on HEP to improve mobility in LUE required for use during ADL completion.   Goal status: IN PROGRESS  2.  Pt will increase LUE A/ROM by 30+ degrees to improve ability to use LUE during dressing tasks with minimal compensatory techniques.   Goal status: IN PROGRESS  3.  Pt will increase LUE  strength to 4/5 to improve ability to lift and move pots and pans during meal preparation and cleanup tasks.   Goal status: IN PROGRESS  4.  Pt will decrease pain in LUE to 3/10 or less to improve ability to sleep for 2+ consecutive hours without waking due to pain  Goal status: IN PROGRESS  5.  Pt will decrease LUE fascial restrictions to min amounts or less to improve mobility required for functional reaching tasks.      Goal status: IN PROGRESS   ASSESSMENT:  CLINICAL IMPRESSION: This session pt reporting increased pain with all manual, P/ROM, and AA/ROM. She requires increased time and effort, for all tasks this session due to pain and fatigue. Pt required 6+ rest breaks throughout the session. OT providing verbal and tactile cuing throughout session for positioning and technique.   PERFORMANCE DEFICITS: in functional skills including in functional skills including ADLs, IADLs, coordination, tone, ROM, strength, pain, fascial restrictions, muscle spasms, and UE functional use   PLAN:  OT FREQUENCY: 2x/week  OT DURATION: 4 weeks  PLANNED INTERVENTIONS: self care/ADL training, therapeutic exercise, therapeutic activity, neuromuscular re-education, manual therapy, passive range of motion, splinting, electrical stimulation, ultrasound, moist heat, cryotherapy, patient/family education, and DME and/or AE instructions  RECOMMENDED OTHER SERVICES: None at this time  CONSULTED AND AGREED WITH PLAN OF CARE: Patient  PLAN FOR NEXT SESSION: Follow up on HEP, initiate manual techniques, passive stretching, AA/ROM progressed to A/ROM   Trish Mage, OTR/L  [phone] 11/11/2022, 1:38 PM

## 2022-11-14 ENCOUNTER — Ambulatory Visit (HOSPITAL_COMMUNITY): Payer: 59 | Admitting: Occupational Therapy

## 2022-11-14 ENCOUNTER — Encounter (HOSPITAL_COMMUNITY): Payer: Self-pay | Admitting: Occupational Therapy

## 2022-11-14 DIAGNOSIS — M25612 Stiffness of left shoulder, not elsewhere classified: Secondary | ICD-10-CM

## 2022-11-14 DIAGNOSIS — R29898 Other symptoms and signs involving the musculoskeletal system: Secondary | ICD-10-CM

## 2022-11-14 DIAGNOSIS — M25512 Pain in left shoulder: Secondary | ICD-10-CM

## 2022-11-14 DIAGNOSIS — Z96612 Presence of left artificial shoulder joint: Secondary | ICD-10-CM | POA: Diagnosis not present

## 2022-11-14 NOTE — Patient Instructions (Signed)

## 2022-11-14 NOTE — Therapy (Signed)
OUTPATIENT OCCUPATIONAL THERAPY ORTHO TREATMENT NOTE  Patient Name: Leah Lynch MRN: [id number] DOB:Nov 24, 1945, 77 y.o., female Today's Date: 11/14/2022   END OF SESSION:  OT End of Session - 11/14/22 0919     Visit Number 3    Number of Visits 8    Date for OT Re-Evaluation 12/04/22    Authorization Type [employer]    Authorization Time Period no visit limit    Progress Note Due on Visit 10    OT Start Time 0821    OT Stop Time 0904    OT Time Calculation (min) 43 min    Activity Tolerance Patient tolerated treatment well    Behavior During Therapy WFL for tasks assessed/performed             Past Medical History:  Diagnosis Date   Fibrocystic breast    GERD (gastroesophageal reflux disease)    Hyperlipidemia    Hypertension    Osteoarthritis    Osteoporosis    PONV (postoperative nausea and vomiting)    Pre-diabetes    Shortness of breath dyspnea    Past Surgical History:  Procedure Laterality Date   ABDOMINAL HYSTERECTOMY     BIOPSY  04/15/2018   Procedure: BIOPSY;  Surgeon: Corbin Ade, MD;  [facility];  Service: Endoscopy;;  colon   BREAST CYST EXCISION Right    BREAST SURGERY     removal of cyst from right breast-fibrocystsic   BUNIONECTOMY  12/19/2010   Procedure: BUNIONECTOMY;  Surgeon: Dallas Schimke;  Location: [facility];  Service: Orthopedics;  Laterality: Right;  [name], [name] Bunionectomy   BUNIONECTOMY  05/22/2011   Procedure: Arbutus Leas;  Surgeon: Dallas Schimke, DPM;  Location: [facility];  Service: Orthopedics;  Laterality: Left;  [name] Bunionectomy Left Foot   CAPSULOTOMY  01/29/2012   Procedure: CAPSULOTOMY;  Surgeon: Dallas Schimke, DPM;  Location: [facility];  Service: Orthopedics;  Laterality: Left;   CATARACT EXTRACTION W/PHACO Right 08/09/2015   Procedure: CATARACT EXTRACTION PHACO AND INTRAOCULAR LENS PLACEMENT RIGHT EYE CDE=7.95;  Surgeon: Gemma Payor, MD;  Location: [facility];   Service: Ophthalmology;  Laterality: Right;   CATARACT EXTRACTION W/PHACO Left 08/27/2015   Procedure: CATARACT EXTRACTION PHACO AND INTRAOCULAR LENS PLACEMENT LEFT EYE; CDE:  6.12;  Surgeon: Gemma Payor, MD;  Location: [facility];  Service: Ophthalmology;  Laterality: Left;   COLONOSCOPY  2012   Dr. Jena Gauss: internal hemorrhoids, hyperplastic polyps. Surveillance 2022   COLONOSCOPY WITH PROPOFOL N/A 04/15/2018   one 7 mm polyp at splenic flexure, one 4 mm polyp in rectum, otherwise normal. S/p segmental biopsy:normal. Normal TI   DIAGNOSTIC LAPAROSCOPY     ESOPHAGOGASTRODUODENOSCOPY (EGD) WITH PROPOFOL N/A 04/15/2018   normal   FOOT GANGLION EXCISION     right foot   METATARSAL OSTEOTOMY  05/22/2011   Procedure: METATARSAL OSTEOTOMY;  Surgeon: Dallas Schimke, DPM;  [facility]: [facility];  Service: Orthopedics;  Laterality: Left;  [name] Osteotomy Left Foot   POLYPECTOMY  04/15/2018   Procedure: POLYPECTOMY;  Surgeon: Corbin Ade, MD;  [facility];  Service: Endoscopy;;  colon   REVERSE SHOULDER ARTHROPLASTY Left 09/08/2022   Procedure: LEFT REVERSE SHOULDER ARTHROPLASTY;  Surgeon: Oliver Barre, MD;  Location: [facility];  Service: Orthopedics;  Laterality: Left;  RNFA NEEDED   TONSILLECTOMY  age 57   TRIGGER FINGER RELEASE Right 01/28/2018   Procedure: RIGHT TRIGGER THUMB RELEASE;  Surgeon: Vickki Hearing, MD;  Location: [facility];  Service:  Orthopedics;  Laterality: Right;   TUBAL LIGATION     Patient Active Problem List   Diagnosis Date Noted   Controlled type 2 diabetes mellitus without complication, without long-term current use of insulin (HCC) 09/30/2022   Rotator cuff arthropathy of left shoulder 09/08/2022   Essential hypertension 09/03/2022   GERD (gastroesophageal reflux disease) 09/03/2022   Hyperlipidemia 09/03/2022   Osteoporosis 09/03/2022   Chronic musculoskeletal pain 09/03/2022   Rotator cuff arthropathy, left 09/03/2022   Screening for lung cancer  09/03/2022   Early satiety 02/22/2018   Loss of weight 02/22/2018   S/P trigger finger release right thumb 11/141/9 02/08/2018   Diarrhea 11/12/2016   Abdominal pain 11/12/2016   Nausea without vomiting 11/12/2016    PCP: Dr. Christel Mormon REFERRING PROVIDER: Dr. Thane Edu  ONSET DATE: 09/08/22  REFERRING DIAG: s/p left reverse TSA  THERAPY DIAG:  Acute pain of left shoulder  Other symptoms and signs involving the musculoskeletal system  Stiffness of left shoulder, not elsewhere classified  Rationale for Evaluation and Treatment: Rehabilitation  SUBJECTIVE:   SUBJECTIVE STATEMENT: S: It has been really sore. Pt accompanied by: self  PERTINENT HISTORY: Pt is a 77 y/o female s/p left reverse TSA on 09/08/22. Pt received HH therapy for several weeks, stopped approximately 1 week ago.   PRECAUTIONS: Shoulder See Protocol  WEIGHT BEARING RESTRICTIONS: Yes NWB  PAIN:  Are you having pain? No and I do get 6/10+ pain when moving it  FALLS: Has patient fallen in last 6 months? Yes. Number of falls 1  PLOF: Independent  PATIENT GOALS: To be able to use the LUE.   NEXT MD VISIT: 12/02/22  OBJECTIVE:   HAND DOMINANCE: Right  ADLs: Overall ADLs: Pt reports she is having difficulty moving her arm for most tasks. Pt has difficulty with reaching up overhead and behind her back. She is able to dress herself, is unable to reach back behind her for pulling pants or underwear over hips. Pt is not lifting anything of weight, does not lift pots and pans. Pt is sleeping in the bed, on average sleeps ok, has difficulty when she is hurting.   FUNCTIONAL OUTCOME MEASURES: FOTO: 50/100  UPPER EXTREMITY ROM:       Assessed in supine, er/IR adducted  Passive ROM Left eval  Shoulder flexion 120  Shoulder abduction 142  Shoulder internal rotation 90  Shoulder external rotation 35  (Blank rows = not tested)  Assessed in seated, er/IR adducted  Active ROM Left eval  Shoulder  flexion 102  Shoulder abduction 76  Shoulder internal rotation 90  Shoulder external rotation 0  (Blank rows = not tested)    UPPER EXTREMITY MMT:     Assessed in sitting, er/IR adducted-via observation on eval   MMT Left eval  Shoulder flexion 3-/5  Shoulder abduction 3-/5  Shoulder internal rotation 3/5  Shoulder external rotation 3-/5  (Blank rows = not tested)  SENSATION: WFL  EDEMA: occasional with weather changes  COGNITION: Overall cognitive status: Within functional limits for tasks assessed  OBSERVATIONS: mod fascial restrictions along upper arm, anterior shoulder, and trapezius regions   TODAY'S TREATMENT:  DATE:   11/14/22 -Manual Therapy: myofascial release and trigger point applied to the bicep, deltoid, trapezius, scapular region and axillary region in order to reduce fascial restrictions and pain in order to improve ROM.  -AA/ROM: supine, flexion, abduction, protraction, horizontal abduction, er/IR, x10 -A/ROM: supine, flexion, abduction, protraction, horizontal abduction, er/IR, x8 -Wall Slides: flexion, abduction, x10  11/11/22 -Manual Therapy: myofascial release and trigger point applied to the bicep, deltoid, trapezius, scapular region and axillary region in order to reduce fascial restrictions and pain in order to improve ROM.  -P/ROM: flexion, abduction, er/IR, x10 -AA/ROM: supine, flexion, abduction, protraction, horizontal abduction, er/IR, x10 -Counter slides: flexion, x10  11/04/22 -AA/ROM: seated-protraction, flexion, abduction, er/IR, horizontal abduction, 5 reps    PATIENT EDUCATION: Education details: A/ROM Person educated: Patient Education method: Programmer, multimedia, Facilities manager, and Handouts Education comprehension: verbalized understanding and returned demonstration  HOME EXERCISE PROGRAM: Eval:  A/ROM  GOALS: Goals reviewed with patient? Yes   SHORT TERM GOALS: Target date: 12/05/22  Pt will be provided with and educated on HEP to improve mobility in LUE required for use during ADL completion.   Goal status: IN PROGRESS  2.  Pt will increase LUE A/ROM by 30+ degrees to improve ability to use LUE during dressing tasks with minimal compensatory techniques.   Goal status: IN PROGRESS  3.  Pt will increase LUE strength to 4/5 to improve ability to lift and move pots and pans during meal preparation and cleanup tasks.   Goal status: IN PROGRESS  4.  Pt will decrease pain in LUE to 3/10 or less to improve ability to sleep for 2+ consecutive hours without waking due to pain  Goal status: IN PROGRESS  5.  Pt will decrease LUE fascial restrictions to min amounts or less to improve mobility required for functional reaching tasks.      Goal status: IN PROGRESS   ASSESSMENT:  CLINICAL IMPRESSION: This session pt demonstrating improving ROM. She is able to achieve 75% of full AA/ROM and ~50% of full A/ROM. She is continuing to have increased pain with all movements and requiring frequent rest breaks. OT providing verbal and visual cuing for all positioning and technique throughout session.   PERFORMANCE DEFICITS: in functional skills including in functional skills including ADLs, IADLs, coordination, tone, ROM, strength, pain, fascial restrictions, muscle spasms, and UE functional use   PLAN:  OT FREQUENCY: 2x/week  OT DURATION: 4 weeks  PLANNED INTERVENTIONS: self care/ADL training, therapeutic exercise, therapeutic activity, neuromuscular re-education, manual therapy, passive range of motion, splinting, electrical stimulation, ultrasound, moist heat, cryotherapy, patient/family education, and DME and/or AE instructions  RECOMMENDED OTHER SERVICES: None at this time  CONSULTED AND AGREED WITH PLAN OF CARE: Patient  PLAN FOR NEXT SESSION: Follow up on HEP, initiate manual  techniques, passive stretching, AA/ROM progressed to A/ROM   Trish Mage, OTR/L  [phone] 11/14/2022, 9:21 AM

## 2022-11-18 ENCOUNTER — Encounter: Payer: Self-pay | Admitting: Internal Medicine

## 2022-11-18 ENCOUNTER — Ambulatory Visit (INDEPENDENT_AMBULATORY_CARE_PROVIDER_SITE_OTHER): Payer: 59 | Admitting: Internal Medicine

## 2022-11-18 VITALS — BP 116/75 | HR 74 | Ht 59.0 in | Wt 141.6 lb

## 2022-11-18 DIAGNOSIS — E782 Mixed hyperlipidemia: Secondary | ICD-10-CM

## 2022-11-18 DIAGNOSIS — E119 Type 2 diabetes mellitus without complications: Secondary | ICD-10-CM

## 2022-11-18 DIAGNOSIS — M7918 Myalgia, other site: Secondary | ICD-10-CM | POA: Diagnosis not present

## 2022-11-18 DIAGNOSIS — G8929 Other chronic pain: Secondary | ICD-10-CM

## 2022-11-18 DIAGNOSIS — I10 Essential (primary) hypertension: Secondary | ICD-10-CM | POA: Diagnosis not present

## 2022-11-18 DIAGNOSIS — Z23 Encounter for immunization: Secondary | ICD-10-CM | POA: Insufficient documentation

## 2022-11-18 MED ORDER — HYDROCODONE-ACETAMINOPHEN 10-325 MG PO TABS
1.0000 | ORAL_TABLET | Freq: Four times a day (QID) | ORAL | 0 refills | Status: AC | PRN
Start: 2022-11-18 — End: 2022-12-18

## 2022-11-18 NOTE — Assessment & Plan Note (Signed)
Recent diagnosis.  Diet controlled. -Diabetic eye exam is scheduled for December.

## 2022-11-18 NOTE — Patient Instructions (Addendum)
It was a pleasure to see you today.  Thank you for giving [place] the opportunity to be involved in your care.  Below is a brief recap of your visit and next steps.  We will plan to see you again in 3 months.  Summary No medication changes today One time refill of hydrocodone-acetaminophen provided Follow up in 3 months   Schedule your Medicare Annual Wellness Visit at checkout.

## 2022-11-18 NOTE — Assessment & Plan Note (Signed)
Lipid panel updated in June.  Atorvastatin was increased to 80 mg daily as a result.  We will plan to repeat a lipid panel at follow-up in 3 months.

## 2022-11-18 NOTE — Assessment & Plan Note (Signed)
 Remains adequately controlled on current antihypertensive regimen.  No medication changes are indicated today.

## 2022-11-18 NOTE — Assessment & Plan Note (Signed)
As noted above, she has previously been referred to pain management given chronic opioid use in the setting of diffuse osteoarthritis.  She is currently prescribed hydrocodone-acetaminophen 10-325 mg 3-4 times daily as needed for pain relief.  She is additionally prescribed gabapentin and naproxen.  Reports today that she has not been able to establish care with pain management due to transportation issues.  States that she is not able to drive to [place] currently.  Requesting a one time refill of hydrocodone-acetaminophen. -Hydrocodone-acetaminophen 10-325 mg 3-4 times daily as needed for pain relief x 120 tablets written today.  Patient understands that this is to bridge her until she is able to establish care with pain management.  She was provided with contact information for pain management today.

## 2022-11-18 NOTE — Assessment & Plan Note (Signed)
Influenza vaccine administered today.

## 2022-11-18 NOTE — Progress Notes (Signed)
Established Patient Office Visit  Subjective   Patient ID: Leah Lynch, female    DOB: 1945/05/03  Age: 77 y.o. MRN: [id number]  Chief Complaint  Patient presents with   Hyperlipidemia    Follow up   Leah Lynch returns to care today for routine follow-up.  She was last evaluated by me on 7/16 for diabetes and hyperlipidemia management.  Atorvastatin was increased to 80 mg daily for improved management of hyperlipidemia. In the interim, she has been seen by orthopedic surgery for follow-up.  She is also attending outpatient occupational therapy.  There have otherwise been no acute interval events.  Leah Lynch reports feeling fairly well today.  She endorses chronic musculoskeletal pain but is otherwise asymptomatic.  Her acute concern is that she has not been able to establish care with pain management due to transportation issues and is out of medication currently.  She has been contacting their office to schedule an appointment but cannot make one until she is cleared to drive to [place].  She is requesting a one time refill of hydrocodone-acetaminophen.  Past Medical History:  Diagnosis Date   Fibrocystic breast    GERD (gastroesophageal reflux disease)    Hyperlipidemia    Hypertension    Osteoarthritis    Osteoporosis    PONV (postoperative nausea and vomiting)    Pre-diabetes    Shortness of breath dyspnea    Past Surgical History:  Procedure Laterality Date   ABDOMINAL HYSTERECTOMY     BIOPSY  04/15/2018   Procedure: BIOPSY;  Surgeon: Corbin Ade, MD;  [facility];  Service: Endoscopy;;  colon   BREAST CYST EXCISION Right    BREAST SURGERY     removal of cyst from right breast-fibrocystsic   BUNIONECTOMY  12/19/2010   Procedure: BUNIONECTOMY;  Surgeon: Dallas Schimke;  [facility]: [facility];  Service: Orthopedics;  Laterality: Right;  [name], [name] Bunionectomy   BUNIONECTOMY  05/22/2011   Procedure: Arbutus Leas;  Surgeon:  Dallas Schimke, DPM;  Location: [facility];  Service: Orthopedics;  Laterality: Left;  [name] Bunionectomy Left Foot   CAPSULOTOMY  01/29/2012   Procedure: CAPSULOTOMY;  Surgeon: Dallas Schimke, DPM;  [facility]: [facility];  Service: Orthopedics;  Laterality: Left;   CATARACT EXTRACTION W/PHACO Right 08/09/2015   Procedure: CATARACT EXTRACTION PHACO AND INTRAOCULAR LENS PLACEMENT RIGHT EYE CDE=7.95;  Surgeon: Gemma Payor, MD;  [facility]: [facility];  Service: Ophthalmology;  Laterality: Right;   CATARACT EXTRACTION W/PHACO Left 08/27/2015   Procedure: CATARACT EXTRACTION PHACO AND INTRAOCULAR LENS PLACEMENT LEFT EYE; CDE:  6.12;  Surgeon: Gemma Payor, MD;  Location: [facility];  Service: Ophthalmology;  Laterality: Left;   COLONOSCOPY  2012   Dr. Jena Gauss: internal hemorrhoids, hyperplastic polyps. Surveillance 2022   COLONOSCOPY WITH PROPOFOL N/A 04/15/2018   one 7 mm polyp at splenic flexure, one 4 mm polyp in rectum, otherwise normal. S/p segmental biopsy:normal. Normal TI   DIAGNOSTIC LAPAROSCOPY     ESOPHAGOGASTRODUODENOSCOPY (EGD) WITH PROPOFOL N/A 04/15/2018   normal   FOOT GANGLION EXCISION     right foot   METATARSAL OSTEOTOMY  05/22/2011   Procedure: METATARSAL OSTEOTOMY;  Surgeon: Dallas Schimke, DPM;  [facility];  Service: Orthopedics;  Laterality: Left;  [name] Osteotomy Left Foot   POLYPECTOMY  04/15/2018   Procedure: POLYPECTOMY;  Surgeon: Corbin Ade, MD;  [facility];  Service: Endoscopy;;  colon   REVERSE SHOULDER ARTHROPLASTY Left 09/08/2022   Procedure: LEFT REVERSE SHOULDER  ARTHROPLASTY;  Surgeon: Oliver Barre, MD;  Location: [facility];  Service: Orthopedics;  Laterality: Left;  RNFA NEEDED   TONSILLECTOMY  age 31   TRIGGER FINGER RELEASE Right 01/28/2018   Procedure: RIGHT TRIGGER THUMB RELEASE;  Surgeon: Vickki Hearing, MD;  Location: [facility];  Service: Orthopedics;  Laterality: Right;   TUBAL LIGATION     Social History   Tobacco Use    Smoking status: Former    Current packs/day: 0.00    Average packs/day: 1 pack/day for 47.4 years (47.4 ttl pk-yrs)    Types: Cigarettes    Start date: 03/18/1975    Quit date: 07/27/2022    Years since quitting: 0.3   Smokeless tobacco: Never  Substance Use Topics   Alcohol use: No   Drug use: No   Family History  Problem Relation Age of Onset   Anesthesia problems Neg Hx    Hypotension Neg Hx    Malignant hyperthermia Neg Hx    Pseudochol deficiency Neg Hx    Colon cancer Neg Hx    Allergies  Allergen Reactions   Bee Venom Swelling   Penicillins Hives, Itching and Other (See Comments)    Has patient had a PCN reaction causing immediate rash, facial/tongue/throat swelling, SOB or lightheadedness with hypotension: Yes Has patient had a PCN reaction causing severe rash involving mucus membranes or skin necrosis: No Has patient had a PCN reaction that required hospitalization No Has patient had a PCN reaction occurring within the last 10 years: YES If all of the above answers are "NO", then may proceed with Cephalosporin use.    Latex Itching   Sulfa Antibiotics Itching   Review of Systems  Musculoskeletal:  Positive for joint pain (Left shoulder pain).     Objective:     BP 116/75   Pulse 74   Ht 4\' 11"  (1.499 m)   Wt 141 lb 9.6 oz (64.2 kg)   SpO2 93%   BMI 28.60 kg/m  BP Readings from Last 3 Encounters:  11/18/22 116/75  10/21/22 117/67  09/30/22 119/67   Physical Exam Vitals reviewed.  Constitutional:      General: She is not in acute distress.    Appearance: Normal appearance. She is not toxic-appearing.  HENT:     Head: Normocephalic and atraumatic.     Right Ear: External ear normal.     Left Ear: External ear normal.     Nose: Nose normal. No congestion or rhinorrhea.     Mouth/Throat:     Mouth: Mucous membranes are moist.     Pharynx: Oropharynx is clear. No oropharyngeal exudate or posterior oropharyngeal erythema.  Eyes:     General: No scleral  icterus.    Extraocular Movements: Extraocular movements intact.     Conjunctiva/sclera: Conjunctivae normal.     Pupils: Pupils are equal, round, and reactive to light.  Cardiovascular:     Rate and Rhythm: Normal rate and regular rhythm.     Pulses: Normal pulses.     Heart sounds: Normal heart sounds. No murmur heard.    No friction rub. No gallop.  Pulmonary:     Effort: Pulmonary effort is normal.     Breath sounds: Normal breath sounds. No wheezing, rhonchi or rales.  Abdominal:     General: Abdomen is flat. Bowel sounds are normal. There is no distension.     Palpations: Abdomen is soft.     Tenderness: There is no abdominal tenderness.  Musculoskeletal:  General: No swelling. Normal range of motion.     Cervical back: Normal range of motion.     Right lower leg: No edema.     Left lower leg: No edema.  Lymphadenopathy:     Cervical: No cervical adenopathy.  Skin:    General: Skin is warm and dry.     Capillary Refill: Capillary refill takes less than 2 seconds.     Coloration: Skin is not jaundiced.  Neurological:     General: No focal deficit present.     Mental Status: She is alert and oriented to person, place, and time.  Psychiatric:        Mood and Affect: Mood normal.        Behavior: Behavior normal.   Last CBC Lab Results  Component Value Date   WBC 10.0 08/27/2022   HGB 11.7 (L) 09/08/2022   HCT 37.3 09/08/2022   MCV 88 08/27/2022   MCH 28.1 08/27/2022   RDW 13.3 08/27/2022   PLT 269 08/27/2022   Last metabolic panel Lab Results  Component Value Date   GLUCOSE 108 (H) 08/27/2022   NA 146 (H) 08/27/2022   K 3.9 08/27/2022   CL 104 08/27/2022   CO2 29 08/27/2022   BUN 20 08/27/2022   CREATININE 0.74 08/27/2022   EGFR 83 08/27/2022   CALCIUM 9.5 08/27/2022   PROT 6.6 08/27/2022   ALBUMIN 4.3 08/27/2022   LABGLOB 2.3 08/27/2022   AGRATIO 1.9 08/27/2022   BILITOT 0.2 08/27/2022   ALKPHOS 125 (H) 08/27/2022   AST 26 08/27/2022   ALT 23  08/27/2022   ANIONGAP 7 01/26/2018   Last lipids Lab Results  Component Value Date   CHOL 189 08/27/2022   HDL 76 08/27/2022   LDLCALC 87 08/27/2022   TRIG 152 (H) 08/27/2022   CHOLHDL 2.5 08/27/2022   Last hemoglobin A1c Lab Results  Component Value Date   HGBA1C 6.7 (H) 08/27/2022   Last thyroid functions Lab Results  Component Value Date   TSH 5.080 (H) 08/27/2022   Last vitamin D Lab Results  Component Value Date   VD25OH 34.2 08/27/2022   Last vitamin B12 and Folate Lab Results  Component Value Date   VITAMINB12 852 08/27/2022   FOLATE 16.5 08/27/2022   The 10-year ASCVD risk score (Arnett DK, et al., 2019) is: 38.5%    Assessment & Plan:   Problem List Items Addressed This Visit       Essential hypertension    Remains adequately controlled on current antihypertensive regimen.  No medication changes are indicated today.      Controlled type 2 diabetes mellitus without complication, without long-term current use of insulin (HCC)    Recent diagnosis.  Diet controlled. -Diabetic eye exam is scheduled for December.      Hyperlipidemia    Lipid panel updated in June.  Atorvastatin was increased to 80 mg daily as a result.  We will plan to repeat a lipid panel at follow-up in 3 months.      Chronic musculoskeletal pain - Primary    As noted above, she has previously been referred to pain management given chronic opioid use in the setting of diffuse osteoarthritis.  She is currently prescribed hydrocodone-acetaminophen 10-325 mg 3-4 times daily as needed for pain relief.  She is additionally prescribed gabapentin and naproxen.  Reports today that she has not been able to establish care with pain management due to transportation issues.  States that she is not able to  drive to [place] currently.  Requesting a one time refill of hydrocodone-acetaminophen. -Hydrocodone-acetaminophen 10-325 mg 3-4 times daily as needed for pain relief x 120 tablets written today.   Patient understands that this is to bridge her until she is able to establish care with pain management.  She was provided with contact information for pain management today.      Need for influenza vaccination    Influenza vaccine administered today      Return in about 3 months (around 02/17/2023).   Billie Lade, MD

## 2022-11-19 ENCOUNTER — Encounter (HOSPITAL_COMMUNITY): Payer: Self-pay | Admitting: Occupational Therapy

## 2022-11-19 ENCOUNTER — Ambulatory Visit (HOSPITAL_COMMUNITY): Payer: 59 | Attending: Orthopedic Surgery | Admitting: Occupational Therapy

## 2022-11-19 ENCOUNTER — Other Ambulatory Visit: Payer: Self-pay | Admitting: Internal Medicine

## 2022-11-19 ENCOUNTER — Ambulatory Visit: Payer: 59 | Admitting: Internal Medicine

## 2022-11-19 DIAGNOSIS — F17210 Nicotine dependence, cigarettes, uncomplicated: Secondary | ICD-10-CM | POA: Diagnosis not present

## 2022-11-19 DIAGNOSIS — Z87891 Personal history of nicotine dependence: Secondary | ICD-10-CM | POA: Insufficient documentation

## 2022-11-19 DIAGNOSIS — M25612 Stiffness of left shoulder, not elsewhere classified: Secondary | ICD-10-CM | POA: Diagnosis not present

## 2022-11-19 DIAGNOSIS — M25512 Pain in left shoulder: Secondary | ICD-10-CM | POA: Diagnosis not present

## 2022-11-19 DIAGNOSIS — Z122 Encounter for screening for malignant neoplasm of respiratory organs: Secondary | ICD-10-CM | POA: Diagnosis not present

## 2022-11-19 DIAGNOSIS — I10 Essential (primary) hypertension: Secondary | ICD-10-CM

## 2022-11-19 DIAGNOSIS — R29898 Other symptoms and signs involving the musculoskeletal system: Secondary | ICD-10-CM | POA: Insufficient documentation

## 2022-11-19 DIAGNOSIS — E119 Type 2 diabetes mellitus without complications: Secondary | ICD-10-CM

## 2022-11-19 NOTE — Therapy (Unsigned)
OUTPATIENT OCCUPATIONAL THERAPY ORTHO TREATMENT NOTE  Patient Name: Leah Lynch MRN: [id number] DOB:1945/11/19, 77 y.o., female Today's Date: 11/20/2022   END OF SESSION:  OT End of Session - 11/19/22 1037     Visit Number 4    Number of Visits 8    Date for OT Re-Evaluation 12/04/22    Authorization Type [employer]    Authorization Time Period no visit limit    Progress Note Due on Visit 10    OT Start Time [phone]    OT Stop Time 1037    OT Time Calculation (min) 38 min    Activity Tolerance Patient tolerated treatment well    Behavior During Therapy WFL for tasks assessed/performed             Past Medical History:  Diagnosis Date   Fibrocystic breast    GERD (gastroesophageal reflux disease)    Hyperlipidemia    Hypertension    Osteoarthritis    Osteoporosis    PONV (postoperative nausea and vomiting)    Pre-diabetes    Shortness of breath dyspnea    Past Surgical History:  Procedure Laterality Date   ABDOMINAL HYSTERECTOMY     BIOPSY  04/15/2018   Procedure: BIOPSY;  Surgeon: Corbin Ade, MD;  [facility]: [facility];  Service: Endoscopy;;  colon   BREAST CYST EXCISION Right    BREAST SURGERY     removal of cyst from right breast-fibrocystsic   BUNIONECTOMY  12/19/2010   Procedure: BUNIONECTOMY;  Surgeon: Dallas Schimke;  Location: [facility];  Service: Orthopedics;  Laterality: Right;  [name], [name] Bunionectomy   BUNIONECTOMY  05/22/2011   Procedure: Arbutus Leas;  Surgeon: Dallas Schimke, DPM;  Location: [facility];  Service: Orthopedics;  Laterality: Left;  [name] Bunionectomy Left Foot   CAPSULOTOMY  01/29/2012   Procedure: CAPSULOTOMY;  Surgeon: Dallas Schimke, DPM;  Location: [facility];  Service: Orthopedics;  Laterality: Left;   CATARACT EXTRACTION W/PHACO Right 08/09/2015   Procedure: CATARACT EXTRACTION PHACO AND INTRAOCULAR LENS PLACEMENT RIGHT EYE CDE=7.95;  Surgeon: Gemma Payor, MD;  Location: [facility];  Service:  Ophthalmology;  Laterality: Right;   CATARACT EXTRACTION W/PHACO Left 08/27/2015   Procedure: CATARACT EXTRACTION PHACO AND INTRAOCULAR LENS PLACEMENT LEFT EYE; CDE:  6.12;  Surgeon: Gemma Payor, MD;  Location: [facility];  Service: Ophthalmology;  Laterality: Left;   COLONOSCOPY  2012   Dr. Jena Gauss: internal hemorrhoids, hyperplastic polyps. Surveillance 2022   COLONOSCOPY WITH PROPOFOL N/A 04/15/2018   one 7 mm polyp at splenic flexure, one 4 mm polyp in rectum, otherwise normal. S/p segmental biopsy:normal. Normal TI   DIAGNOSTIC LAPAROSCOPY     ESOPHAGOGASTRODUODENOSCOPY (EGD) WITH PROPOFOL N/A 04/15/2018   normal   FOOT GANGLION EXCISION     right foot   METATARSAL OSTEOTOMY  05/22/2011   Procedure: METATARSAL OSTEOTOMY;  Surgeon: Dallas Schimke, DPM;  Location: [facility];  Service: Orthopedics;  Laterality: Left;  [name] Osteotomy Left Foot   POLYPECTOMY  04/15/2018   Procedure: POLYPECTOMY;  Surgeon: Corbin Ade, MD;  [facility]: [facility];  Service: Endoscopy;;  colon   REVERSE SHOULDER ARTHROPLASTY Left 09/08/2022   Procedure: LEFT REVERSE SHOULDER ARTHROPLASTY;  Surgeon: Oliver Barre, MD;  Location: [facility];  Service: Orthopedics;  Laterality: Left;  RNFA NEEDED   TONSILLECTOMY  age 40   TRIGGER FINGER RELEASE Right 01/28/2018   Procedure: RIGHT TRIGGER THUMB RELEASE;  Surgeon: Vickki Hearing, MD;  Location: [facility];  Service:  Orthopedics;  Laterality: Right;   TUBAL LIGATION     Patient Active Problem List   Diagnosis Date Noted   Need for influenza vaccination 11/18/2022   Controlled type 2 diabetes mellitus without complication, without long-term current use of insulin (HCC) 09/30/2022   Rotator cuff arthropathy of left shoulder 09/08/2022   Essential hypertension 09/03/2022   GERD (gastroesophageal reflux disease) 09/03/2022   Hyperlipidemia 09/03/2022   Osteoporosis 09/03/2022   Chronic musculoskeletal pain 09/03/2022   Rotator cuff arthropathy, left 09/03/2022    Screening for lung cancer 09/03/2022   Early satiety 02/22/2018   Loss of weight 02/22/2018   S/P trigger finger release right thumb 11/141/9 02/08/2018   Diarrhea 11/12/2016   Abdominal pain 11/12/2016   Nausea without vomiting 11/12/2016    PCP: Dr. Christel Mormon REFERRING PROVIDER: Dr. Thane Edu  ONSET DATE: 09/08/22  REFERRING DIAG: s/p left reverse TSA  THERAPY DIAG:  Acute pain of left shoulder  Other symptoms and signs involving the musculoskeletal system  Stiffness of left shoulder, not elsewhere classified  Rationale for Evaluation and Treatment: Rehabilitation  SUBJECTIVE:   SUBJECTIVE STATEMENT: S: It has been really sore. Pt accompanied by: self  PERTINENT HISTORY: Pt is a 77 y/o female s/p left reverse TSA on 09/08/22. Pt received HH therapy for several weeks, stopped approximately 1 week ago.   PRECAUTIONS: Shoulder See Protocol  WEIGHT BEARING RESTRICTIONS: Yes NWB  PAIN:  Are you having pain? No and I do get 6/10+ pain when moving it  FALLS: Has patient fallen in last 6 months? Yes. Number of falls 1  PLOF: Independent  PATIENT GOALS: To be able to use the LUE.   NEXT MD VISIT: 12/02/22  OBJECTIVE:   HAND DOMINANCE: Right  ADLs: Overall ADLs: Pt reports she is having difficulty moving her arm for most tasks. Pt has difficulty with reaching up overhead and behind her back. She is able to dress herself, is unable to reach back behind her for pulling pants or underwear over hips. Pt is not lifting anything of weight, does not lift pots and pans. Pt is sleeping in the bed, on average sleeps ok, has difficulty when she is hurting.   FUNCTIONAL OUTCOME MEASURES: FOTO: 50/100  UPPER EXTREMITY ROM:       Assessed in supine, er/IR adducted  Passive ROM Left eval  Shoulder flexion 120  Shoulder abduction 142  Shoulder internal rotation 90  Shoulder external rotation 35  (Blank rows = not tested)  Assessed in seated, er/IR  adducted  Active ROM Left eval  Shoulder flexion 102  Shoulder abduction 76  Shoulder internal rotation 90  Shoulder external rotation 0  (Blank rows = not tested)    UPPER EXTREMITY MMT:     Assessed in sitting, er/IR adducted-via observation on eval   MMT Left eval  Shoulder flexion 3-/5  Shoulder abduction 3-/5  Shoulder internal rotation 3/5  Shoulder external rotation 3-/5  (Blank rows = not tested)  SENSATION: WFL  EDEMA: occasional with weather changes  COGNITION: Overall cognitive status: Within functional limits for tasks assessed  OBSERVATIONS: mod fascial restrictions along upper arm, anterior shoulder, and trapezius regions   TODAY'S TREATMENT:  DATE:   11/19/22 -Manual Therapy: myofascial release and trigger point applied to the bicep, deltoid, trapezius, scapular region and axillary region in order to reduce fascial restrictions and pain in order to improve ROM.  -AA/ROM: supine, flexion, abduction, protraction, horizontal abduction, er/IR, x12 -A/ROM: supine, flexion, abduction, protraction, horizontal abduction, er/IR, x8 -Pulley: flexion, abduction, x60" each  11/14/22 -Manual Therapy: myofascial release and trigger point applied to the bicep, deltoid, trapezius, scapular region and axillary region in order to reduce fascial restrictions and pain in order to improve ROM.  -AA/ROM: supine, flexion, abduction, protraction, horizontal abduction, er/IR, x10 -A/ROM: supine, flexion, abduction, protraction, horizontal abduction, er/IR, x8 -Wall Slides: flexion, abduction, x10  11/11/22 -Manual Therapy: myofascial release and trigger point applied to the bicep, deltoid, trapezius, scapular region and axillary region in order to reduce fascial restrictions and pain in order to improve ROM.  -P/ROM: flexion, abduction, er/IR, x10 -AA/ROM:  supine, flexion, abduction, protraction, horizontal abduction, er/IR, x10 -Counter slides: flexion, x10    PATIENT EDUCATION: Education details: A/ROM Person educated: Patient Education method: Programmer, multimedia, Facilities manager, and Handouts Education comprehension: verbalized understanding and returned demonstration  HOME EXERCISE PROGRAM: Eval: AA/ROM 9/4: A/ROM  GOALS: Goals reviewed with patient? Yes   SHORT TERM GOALS: Target date: 12/05/22  Pt will be provided with and educated on HEP to improve mobility in LUE required for use during ADL completion.   Goal status: IN PROGRESS  2.  Pt will increase LUE A/ROM by 30+ degrees to improve ability to use LUE during dressing tasks with minimal compensatory techniques.   Goal status: IN PROGRESS  3.  Pt will increase LUE strength to 4/5 to improve ability to lift and move pots and pans during meal preparation and cleanup tasks.   Goal status: IN PROGRESS  4.  Pt will decrease pain in LUE to 3/10 or less to improve ability to sleep for 2+ consecutive hours without waking due to pain  Goal status: IN PROGRESS  5.  Pt will decrease LUE fascial restrictions to min amounts or less to improve mobility required for functional reaching tasks.      Goal status: IN PROGRESS   ASSESSMENT:  CLINICAL IMPRESSION: Pt reporting increased mobility and decreased acute pain this session. She demonstrated improving ROM with 75% of full AA/ROM and 65% of full A/ROM. OT had pt start pulleys this session to continue working on stretching through her ROM to improve it. Verbal and tactile cuing for positioning and technique throughout session.   PERFORMANCE DEFICITS: in functional skills including in functional skills including ADLs, IADLs, coordination, tone, ROM, strength, pain, fascial restrictions, muscle spasms, and UE functional use   PLAN:  OT FREQUENCY: 2x/week  OT DURATION: 4 weeks  PLANNED INTERVENTIONS: self care/ADL training,  therapeutic exercise, therapeutic activity, neuromuscular re-education, manual therapy, passive range of motion, splinting, electrical stimulation, ultrasound, moist heat, cryotherapy, patient/family education, and DME and/or AE instructions  RECOMMENDED OTHER SERVICES: None at this time  CONSULTED AND AGREED WITH PLAN OF CARE: Patient  PLAN FOR NEXT SESSION: Follow up on HEP, initiate manual techniques, passive stretching, AA/ROM progressed to A/ROM   Trish Mage, OTR/L  [phone] 11/20/2022, 8:16 AM

## 2022-11-21 ENCOUNTER — Ambulatory Visit (HOSPITAL_COMMUNITY): Payer: 59 | Admitting: Occupational Therapy

## 2022-11-21 ENCOUNTER — Encounter (HOSPITAL_COMMUNITY): Payer: Self-pay | Admitting: Occupational Therapy

## 2022-11-21 DIAGNOSIS — Z122 Encounter for screening for malignant neoplasm of respiratory organs: Secondary | ICD-10-CM | POA: Diagnosis not present

## 2022-11-21 DIAGNOSIS — Z87891 Personal history of nicotine dependence: Secondary | ICD-10-CM | POA: Diagnosis not present

## 2022-11-21 DIAGNOSIS — M25512 Pain in left shoulder: Secondary | ICD-10-CM | POA: Diagnosis not present

## 2022-11-21 DIAGNOSIS — M25612 Stiffness of left shoulder, not elsewhere classified: Secondary | ICD-10-CM

## 2022-11-21 DIAGNOSIS — R29898 Other symptoms and signs involving the musculoskeletal system: Secondary | ICD-10-CM | POA: Diagnosis not present

## 2022-11-21 DIAGNOSIS — F17210 Nicotine dependence, cigarettes, uncomplicated: Secondary | ICD-10-CM | POA: Diagnosis not present

## 2022-11-21 NOTE — Therapy (Signed)
OUTPATIENT OCCUPATIONAL THERAPY ORTHO TREATMENT NOTE  Patient Name: Leah Lynch MRN: [id number] DOB:08/19/1945, 77 y.o., female Today's Date: 11/21/2022   END OF SESSION:  OT End of Session - 11/21/22 1303     Visit Number 5    Number of Visits 8    Date for OT Re-Evaluation 12/04/22    Authorization Type [employer]    Authorization Time Period no visit limit    Progress Note Due on Visit 10    OT Start Time 1117    OT Stop Time 1200    OT Time Calculation (min) 43 min    Activity Tolerance Patient tolerated treatment well    Behavior During Therapy WFL for tasks assessed/performed             Past Medical History:  Diagnosis Date   Fibrocystic breast    GERD (gastroesophageal reflux disease)    Hyperlipidemia    Hypertension    Osteoarthritis    Osteoporosis    PONV (postoperative nausea and vomiting)    Pre-diabetes    Shortness of breath dyspnea    Past Surgical History:  Procedure Laterality Date   ABDOMINAL HYSTERECTOMY     BIOPSY  04/15/2018   Procedure: BIOPSY;  Surgeon: Corbin Ade, MD;  [facility]: [facility];  Service: Endoscopy;;  colon   BREAST CYST EXCISION Right    BREAST SURGERY     removal of cyst from right breast-fibrocystsic   BUNIONECTOMY  12/19/2010   Procedure: BUNIONECTOMY;  Surgeon: Dallas Schimke;  Location: [facility];  Service: Orthopedics;  Laterality: Right;  [name], [name] Bunionectomy   BUNIONECTOMY  05/22/2011   Procedure: Arbutus Leas;  Surgeon: Dallas Schimke, DPM;  Location: [facility];  Service: Orthopedics;  Laterality: Left;  [name] Bunionectomy Left Foot   CAPSULOTOMY  01/29/2012   Procedure: CAPSULOTOMY;  Surgeon: Dallas Schimke, DPM;  Location: [facility];  Service: Orthopedics;  Laterality: Left;   CATARACT EXTRACTION W/PHACO Right 08/09/2015   Procedure: CATARACT EXTRACTION PHACO AND INTRAOCULAR LENS PLACEMENT RIGHT EYE CDE=7.95;  Surgeon: Gemma Payor, MD;  Location: [facility];  Service:  Ophthalmology;  Laterality: Right;   CATARACT EXTRACTION W/PHACO Left 08/27/2015   Procedure: CATARACT EXTRACTION PHACO AND INTRAOCULAR LENS PLACEMENT LEFT EYE; CDE:  6.12;  Surgeon: Gemma Payor, MD;  Location: [facility];  Service: Ophthalmology;  Laterality: Left;   COLONOSCOPY  2012   Dr. Jena Gauss: internal hemorrhoids, hyperplastic polyps. Surveillance 2022   COLONOSCOPY WITH PROPOFOL N/A 04/15/2018   one 7 mm polyp at splenic flexure, one 4 mm polyp in rectum, otherwise normal. S/p segmental biopsy:normal. Normal TI   DIAGNOSTIC LAPAROSCOPY     ESOPHAGOGASTRODUODENOSCOPY (EGD) WITH PROPOFOL N/A 04/15/2018   normal   FOOT GANGLION EXCISION     right foot   METATARSAL OSTEOTOMY  05/22/2011   Procedure: METATARSAL OSTEOTOMY;  Surgeon: Dallas Schimke, DPM;  Location: [facility];  Service: Orthopedics;  Laterality: Left;  [name] Osteotomy Left Foot   POLYPECTOMY  04/15/2018   Procedure: POLYPECTOMY;  Surgeon: Corbin Ade, MD;  [facility];  Service: Endoscopy;;  colon   REVERSE SHOULDER ARTHROPLASTY Left 09/08/2022   Procedure: LEFT REVERSE SHOULDER ARTHROPLASTY;  Surgeon: Oliver Barre, MD;  Location: [facility];  Service: Orthopedics;  Laterality: Left;  RNFA NEEDED   TONSILLECTOMY  age 3   TRIGGER FINGER RELEASE Right 01/28/2018   Procedure: RIGHT TRIGGER THUMB RELEASE;  Surgeon: Vickki Hearing, MD;  Location: [facility];  Service:  Orthopedics;  Laterality: Right;   TUBAL LIGATION     Patient Active Problem List   Diagnosis Date Noted   Need for influenza vaccination 11/18/2022   Controlled type 2 diabetes mellitus without complication, without long-term current use of insulin (HCC) 09/30/2022   Rotator cuff arthropathy of left shoulder 09/08/2022   Essential hypertension 09/03/2022   GERD (gastroesophageal reflux disease) 09/03/2022   Hyperlipidemia 09/03/2022   Osteoporosis 09/03/2022   Chronic musculoskeletal pain 09/03/2022   Rotator cuff arthropathy, left 09/03/2022    Screening for lung cancer 09/03/2022   Early satiety 02/22/2018   Loss of weight 02/22/2018   S/P trigger finger release right thumb 11/141/9 02/08/2018   Diarrhea 11/12/2016   Abdominal pain 11/12/2016   Nausea without vomiting 11/12/2016    PCP: Dr. Christel Mormon REFERRING PROVIDER: Dr. Thane Edu  ONSET DATE: 09/08/22  REFERRING DIAG: s/p left reverse TSA  THERAPY DIAG:  Acute pain of left shoulder  Stiffness of left shoulder, not elsewhere classified  Other symptoms and signs involving the musculoskeletal system  Rationale for Evaluation and Treatment: Rehabilitation  SUBJECTIVE:   SUBJECTIVE STATEMENT: S: It has been really sore. Pt accompanied by: self  PERTINENT HISTORY: Pt is a 77 y/o female s/p left reverse TSA on 09/08/22. Pt received HH therapy for several weeks, stopped approximately 1 week ago.   PRECAUTIONS: Shoulder See Protocol  WEIGHT BEARING RESTRICTIONS: Yes NWB  PAIN:  Are you having pain? No and I do get 6/10+ pain when moving it  FALLS: Has patient fallen in last 6 months? Yes. Number of falls 1  PLOF: Independent  PATIENT GOALS: To be able to use the LUE.   NEXT MD VISIT: 12/02/22  OBJECTIVE:   HAND DOMINANCE: Right  ADLs: Overall ADLs: Pt reports she is having difficulty moving her arm for most tasks. Pt has difficulty with reaching up overhead and behind her back. She is able to dress herself, is unable to reach back behind her for pulling pants or underwear over hips. Pt is not lifting anything of weight, does not lift pots and pans. Pt is sleeping in the bed, on average sleeps ok, has difficulty when she is hurting.   FUNCTIONAL OUTCOME MEASURES: FOTO: 50/100  UPPER EXTREMITY ROM:       Assessed in supine, er/IR adducted  Passive ROM Left eval  Shoulder flexion 120  Shoulder abduction 142  Shoulder internal rotation 90  Shoulder external rotation 35  (Blank rows = not tested)  Assessed in seated, er/IR  adducted  Active ROM Left eval  Shoulder flexion 102  Shoulder abduction 76  Shoulder internal rotation 90  Shoulder external rotation 0  (Blank rows = not tested)    UPPER EXTREMITY MMT:     Assessed in sitting, er/IR adducted-via observation on eval   MMT Left eval  Shoulder flexion 3-/5  Shoulder abduction 3-/5  Shoulder internal rotation 3/5  Shoulder external rotation 3-/5  (Blank rows = not tested)  SENSATION: WFL  EDEMA: occasional with weather changes  COGNITION: Overall cognitive status: Within functional limits for tasks assessed  OBSERVATIONS: mod fascial restrictions along upper arm, anterior shoulder, and trapezius regions   TODAY'S TREATMENT:  DATE:   11/21/22 -Manual Therapy: myofascial release and trigger point applied to the bicep, deltoid, trapezius, scapular region and axillary region in order to reduce fascial restrictions and pain in order to improve ROM.  -AA/ROM: supine, flexion, abduction, protraction, horizontal abduction, er/IR, x15 -A/ROM: supine, flexion, abduction, protraction, horizontal abduction, er/IR, x10 -Wall Wash x60" -Thumb tacks x60"  11/19/22 -Manual Therapy: myofascial release and trigger point applied to the bicep, deltoid, trapezius, scapular region and axillary region in order to reduce fascial restrictions and pain in order to improve ROM.  -AA/ROM: supine, flexion, abduction, protraction, horizontal abduction, er/IR, x12 -A/ROM: supine, flexion, abduction, protraction, horizontal abduction, er/IR, x8 -Pulley: flexion, abduction, x60" each  11/14/22 -Manual Therapy: myofascial release and trigger point applied to the bicep, deltoid, trapezius, scapular region and axillary region in order to reduce fascial restrictions and pain in order to improve ROM.  -AA/ROM: supine, flexion, abduction, protraction,  horizontal abduction, er/IR, x10 -A/ROM: supine, flexion, abduction, protraction, horizontal abduction, er/IR, x8 -Wall Slides: flexion, abduction, x10   PATIENT EDUCATION: Education details: A/ROM Person educated: Patient Education method: Programmer, multimedia, Facilities manager, and Handouts Education comprehension: verbalized understanding and returned demonstration  HOME EXERCISE PROGRAM: Eval: AA/ROM 9/4: A/ROM  GOALS: Goals reviewed with patient? Yes   SHORT TERM GOALS: Target date: 12/05/22  Pt will be provided with and educated on HEP to improve mobility in LUE required for use during ADL completion.   Goal status: IN PROGRESS  2.  Pt will increase LUE A/ROM by 30+ degrees to improve ability to use LUE during dressing tasks with minimal compensatory techniques.   Goal status: IN PROGRESS  3.  Pt will increase LUE strength to 4/5 to improve ability to lift and move pots and pans during meal preparation and cleanup tasks.   Goal status: IN PROGRESS  4.  Pt will decrease pain in LUE to 3/10 or less to improve ability to sleep for 2+ consecutive hours without waking due to pain  Goal status: IN PROGRESS  5.  Pt will decrease LUE fascial restrictions to min amounts or less to improve mobility required for functional reaching tasks.      Goal status: IN PROGRESS   ASSESSMENT:  CLINICAL IMPRESSION: This session, pt demonstrating 85% of full ROM with AA/ROM and approximately 65% of full A/ROM. Pt continues to have increased discomfort with abduction and horizontal abduction, requiring rest breaks to decrease pain. OT adding wall washes and thumb tacs to improve endurance and joint stability. Verbal and tactile cuing provided throughout session for positioning and technique.    PERFORMANCE DEFICITS: in functional skills including in functional skills including ADLs, IADLs, coordination, tone, ROM, strength, pain, fascial restrictions, muscle spasms, and UE functional  use   PLAN:  OT FREQUENCY: 2x/week  OT DURATION: 4 weeks  PLANNED INTERVENTIONS: self care/ADL training, therapeutic exercise, therapeutic activity, neuromuscular re-education, manual therapy, passive range of motion, splinting, electrical stimulation, ultrasound, moist heat, cryotherapy, patient/family education, and DME and/or AE instructions  RECOMMENDED OTHER SERVICES: None at this time  CONSULTED AND AGREED WITH PLAN OF CARE: Patient  PLAN FOR NEXT SESSION: Follow up on HEP, initiate manual techniques, passive stretching, AA/ROM progressed to A/ROM   Trish Mage, OTR/L  [phone] 11/21/2022, 1:04 PM

## 2022-11-25 ENCOUNTER — Encounter (HOSPITAL_COMMUNITY): Payer: Self-pay | Admitting: Occupational Therapy

## 2022-11-25 ENCOUNTER — Ambulatory Visit (HOSPITAL_COMMUNITY): Payer: 59 | Admitting: Occupational Therapy

## 2022-11-25 DIAGNOSIS — R29898 Other symptoms and signs involving the musculoskeletal system: Secondary | ICD-10-CM

## 2022-11-25 DIAGNOSIS — M25612 Stiffness of left shoulder, not elsewhere classified: Secondary | ICD-10-CM | POA: Diagnosis not present

## 2022-11-25 DIAGNOSIS — F17210 Nicotine dependence, cigarettes, uncomplicated: Secondary | ICD-10-CM | POA: Diagnosis not present

## 2022-11-25 DIAGNOSIS — Z122 Encounter for screening for malignant neoplasm of respiratory organs: Secondary | ICD-10-CM | POA: Diagnosis not present

## 2022-11-25 DIAGNOSIS — Z87891 Personal history of nicotine dependence: Secondary | ICD-10-CM | POA: Diagnosis not present

## 2022-11-25 DIAGNOSIS — M25512 Pain in left shoulder: Secondary | ICD-10-CM

## 2022-11-25 NOTE — Therapy (Signed)
OUTPATIENT OCCUPATIONAL THERAPY ORTHO TREATMENT NOTE  Patient Name: Leah Lynch MRN: [id number] DOB:Aug 24, 1945, 77 y.o., female Today's Date: 11/25/2022   END OF SESSION:  OT End of Session - 11/25/22 1159     Visit Number 6    Number of Visits 8    Date for OT Re-Evaluation 12/04/22    Authorization Type [employer]    Authorization Time Period no visit limit    Progress Note Due on Visit 10    OT Start Time 1122    OT Stop Time 1200    OT Time Calculation (min) 38 min    Activity Tolerance Patient tolerated treatment well    Behavior During Therapy WFL for tasks assessed/performed              Past Medical History:  Diagnosis Date   Fibrocystic breast    GERD (gastroesophageal reflux disease)    Hyperlipidemia    Hypertension    Osteoarthritis    Osteoporosis    PONV (postoperative nausea and vomiting)    Pre-diabetes    Shortness of breath dyspnea    Past Surgical History:  Procedure Laterality Date   ABDOMINAL HYSTERECTOMY     BIOPSY  04/15/2018   Procedure: BIOPSY;  Surgeon: Corbin Ade, MD;  [facility]: [facility];  Service: Endoscopy;;  colon   BREAST CYST EXCISION Right    BREAST SURGERY     removal of cyst from right breast-fibrocystsic   BUNIONECTOMY  12/19/2010   Procedure: BUNIONECTOMY;  Surgeon: Dallas Schimke;  Location: [facility];  Service: Orthopedics;  Laterality: Right;  [name], [name] Bunionectomy   BUNIONECTOMY  05/22/2011   Procedure: Arbutus Leas;  Surgeon: Dallas Schimke, DPM;  Location: [facility];  Service: Orthopedics;  Laterality: Left;  [name] Bunionectomy Left Foot   CAPSULOTOMY  01/29/2012   Procedure: CAPSULOTOMY;  Surgeon: Dallas Schimke, DPM;  Location: [facility];  Service: Orthopedics;  Laterality: Left;   CATARACT EXTRACTION W/PHACO Right 08/09/2015   Procedure: CATARACT EXTRACTION PHACO AND INTRAOCULAR LENS PLACEMENT RIGHT EYE CDE=7.95;  Surgeon: Gemma Payor, MD;  Location: [facility];   Service: Ophthalmology;  Laterality: Right;   CATARACT EXTRACTION W/PHACO Left 08/27/2015   Procedure: CATARACT EXTRACTION PHACO AND INTRAOCULAR LENS PLACEMENT LEFT EYE; CDE:  6.12;  Surgeon: Gemma Payor, MD;  Location: [facility];  Service: Ophthalmology;  Laterality: Left;   COLONOSCOPY  2012   Dr. Jena Gauss: internal hemorrhoids, hyperplastic polyps. Surveillance 2022   COLONOSCOPY WITH PROPOFOL N/A 04/15/2018   one 7 mm polyp at splenic flexure, one 4 mm polyp in rectum, otherwise normal. S/p segmental biopsy:normal. Normal TI   DIAGNOSTIC LAPAROSCOPY     ESOPHAGOGASTRODUODENOSCOPY (EGD) WITH PROPOFOL N/A 04/15/2018   normal   FOOT GANGLION EXCISION     right foot   METATARSAL OSTEOTOMY  05/22/2011   Procedure: METATARSAL OSTEOTOMY;  Surgeon: Dallas Schimke, DPM;  Location: [facility];  Service: Orthopedics;  Laterality: Left;  [name] Osteotomy Left Foot   POLYPECTOMY  04/15/2018   Procedure: POLYPECTOMY;  Surgeon: Corbin Ade, MD;  [facility]: [facility];  Service: Endoscopy;;  colon   REVERSE SHOULDER ARTHROPLASTY Left 09/08/2022   Procedure: LEFT REVERSE SHOULDER ARTHROPLASTY;  Surgeon: Oliver Barre, MD;  Location: [facility];  Service: Orthopedics;  Laterality: Left;  RNFA NEEDED   TONSILLECTOMY  age 89   TRIGGER FINGER RELEASE Right 01/28/2018   Procedure: RIGHT TRIGGER THUMB RELEASE;  Surgeon: Vickki Hearing, MD;  Location: [facility];  Service: Orthopedics;  Laterality: Right;   TUBAL LIGATION     Patient Active Problem List   Diagnosis Date Noted   Need for influenza vaccination 11/18/2022   Controlled type 2 diabetes mellitus without complication, without long-term current use of insulin (HCC) 09/30/2022   Rotator cuff arthropathy of left shoulder 09/08/2022   Essential hypertension 09/03/2022   GERD (gastroesophageal reflux disease) 09/03/2022   Hyperlipidemia 09/03/2022   Osteoporosis 09/03/2022   Chronic musculoskeletal pain 09/03/2022   Rotator cuff arthropathy, left  09/03/2022   Screening for lung cancer 09/03/2022   Early satiety 02/22/2018   Loss of weight 02/22/2018   S/P trigger finger release right thumb 11/141/9 02/08/2018   Diarrhea 11/12/2016   Abdominal pain 11/12/2016   Nausea without vomiting 11/12/2016    PCP: Dr. Christel Mormon REFERRING PROVIDER: Dr. Thane Edu  ONSET DATE: 09/08/22  REFERRING DIAG: s/p left reverse TSA  THERAPY DIAG:  Stiffness of left shoulder, not elsewhere classified  Acute pain of left shoulder  Other symptoms and signs involving the musculoskeletal system  Rationale for Evaluation and Treatment: Rehabilitation  SUBJECTIVE:   SUBJECTIVE STATEMENT: S: I'm really hurting today Pt accompanied by: self  PERTINENT HISTORY: Pt is a 77 y/o female s/p left reverse TSA on 09/08/22. Pt received HH therapy for several weeks, stopped approximately 1 week ago.   PRECAUTIONS: Shoulder See Protocol  WEIGHT BEARING RESTRICTIONS: Yes NWB  PAIN:  Are you having pain? Yes: NPRS scale: 7/10 Pain location: anterior shoulder joint Pain description: aching and pulling Aggravating factors: movement Relieving factors: medicine  FALLS: Has patient fallen in last 6 months? Yes. Number of falls 1  PLOF: Independent  PATIENT GOALS: To be able to use the LUE.   NEXT MD VISIT: 12/02/22  OBJECTIVE:   HAND DOMINANCE: Right  ADLs: Overall ADLs: Pt reports she is having difficulty moving her arm for most tasks. Pt has difficulty with reaching up overhead and behind her back. She is able to dress herself, is unable to reach back behind her for pulling pants or underwear over hips. Pt is not lifting anything of weight, does not lift pots and pans. Pt is sleeping in the bed, on average sleeps ok, has difficulty when she is hurting.   FUNCTIONAL OUTCOME MEASURES: FOTO: 50/100  UPPER EXTREMITY ROM:       Assessed in supine, er/IR adducted  Passive ROM Left eval  Shoulder flexion 120  Shoulder abduction 142   Shoulder internal rotation 90  Shoulder external rotation 35  (Blank rows = not tested)  Assessed in seated, er/IR adducted  Active ROM Left eval  Shoulder flexion 102  Shoulder abduction 76  Shoulder internal rotation 90  Shoulder external rotation 0  (Blank rows = not tested)    UPPER EXTREMITY MMT:     Assessed in sitting, er/IR adducted-via observation on eval   MMT Left eval  Shoulder flexion 3-/5  Shoulder abduction 3-/5  Shoulder internal rotation 3/5  Shoulder external rotation 3-/5  (Blank rows = not tested)  SENSATION: WFL  EDEMA: occasional with weather changes  COGNITION: Overall cognitive status: Within functional limits for tasks assessed  OBSERVATIONS: mod fascial restrictions along upper arm, anterior shoulder, and trapezius regions   TODAY'S TREATMENT:  DATE:   11/25/22 -Manual Therapy: myofascial release and trigger point applied to the bicep, deltoid, trapezius, scapular region and axillary region in order to reduce fascial restrictions and pain in order to improve ROM.  -AA/ROM: supine, flexion, abduction, protraction, horizontal abduction, er/IR, x15 -A/ROM: supine, flexion, abduction, protraction, horizontal abduction, er/IR, x10 -Wall Slides: flexion, abduction, x8  11/21/22 -Manual Therapy: myofascial release and trigger point applied to the bicep, deltoid, trapezius, scapular region and axillary region in order to reduce fascial restrictions and pain in order to improve ROM.  -AA/ROM: supine, flexion, abduction, protraction, horizontal abduction, er/IR, x15 -A/ROM: supine, flexion, abduction, protraction, horizontal abduction, er/IR, x10 -Wall Wash x60" -Thumb tacks x60"  11/19/22 -Manual Therapy: myofascial release and trigger point applied to the bicep, deltoid, trapezius, scapular region and axillary region in  order to reduce fascial restrictions and pain in order to improve ROM.  -AA/ROM: supine, flexion, abduction, protraction, horizontal abduction, er/IR, x12 -A/ROM: supine, flexion, abduction, protraction, horizontal abduction, er/IR, x8 -Pulley: flexion, abduction, x60" each   PATIENT EDUCATION: Education details: Wall Slides Person educated: Patient Education method: Explanation, Demonstration, and Handouts Education comprehension: verbalized understanding and returned demonstration  HOME EXERCISE PROGRAM: Eval: AA/ROM 9/4: A/ROM 9/10: Wall Slides  GOALS: Goals reviewed with patient? Yes   SHORT TERM GOALS: Target date: 12/05/22  Pt will be provided with and educated on HEP to improve mobility in LUE required for use during ADL completion.   Goal status: IN PROGRESS  2.  Pt will increase LUE A/ROM by 30+ degrees to improve ability to use LUE during dressing tasks with minimal compensatory techniques.   Goal status: IN PROGRESS  3.  Pt will increase LUE strength to 4/5 to improve ability to lift and move pots and pans during meal preparation and cleanup tasks.   Goal status: IN PROGRESS  4.  Pt will decrease pain in LUE to 3/10 or less to improve ability to sleep for 2+ consecutive hours without waking due to pain  Goal status: IN PROGRESS  5.  Pt will decrease LUE fascial restrictions to min amounts or less to improve mobility required for functional reaching tasks.      Goal status: IN PROGRESS   ASSESSMENT:  CLINICAL IMPRESSION: This session, pt having increased soreness and stiffness, decreasing her ROM with AA/ROM to approximately 75% and A/ROM to approximately 55-60% of full ROM. OT adding wall slides this session for continued progress with ROM.Verbal and tactile cuing provided throughout session for positioning and technique.   PERFORMANCE DEFICITS: in functional skills including in functional skills including ADLs, IADLs, coordination, tone, ROM, strength,  pain, fascial restrictions, muscle spasms, and UE functional use   PLAN:  OT FREQUENCY: 2x/week  OT DURATION: 4 weeks  PLANNED INTERVENTIONS: self care/ADL training, therapeutic exercise, therapeutic activity, neuromuscular re-education, manual therapy, passive range of motion, splinting, electrical stimulation, ultrasound, moist heat, cryotherapy, patient/family education, and DME and/or AE instructions  RECOMMENDED OTHER SERVICES: None at this time  CONSULTED AND AGREED WITH PLAN OF CARE: Patient  PLAN FOR NEXT SESSION: Follow up on HEP, initiate manual techniques, passive stretching, AA/ROM progressed to A/ROM   Trish Mage, OTR/L  [phone] 11/25/2022, 12:00 PM

## 2022-11-27 ENCOUNTER — Ambulatory Visit (HOSPITAL_COMMUNITY): Payer: 59 | Admitting: Occupational Therapy

## 2022-11-27 ENCOUNTER — Encounter (HOSPITAL_COMMUNITY): Payer: Self-pay | Admitting: Occupational Therapy

## 2022-11-27 DIAGNOSIS — R29898 Other symptoms and signs involving the musculoskeletal system: Secondary | ICD-10-CM | POA: Diagnosis not present

## 2022-11-27 DIAGNOSIS — M25512 Pain in left shoulder: Secondary | ICD-10-CM | POA: Diagnosis not present

## 2022-11-27 DIAGNOSIS — F17210 Nicotine dependence, cigarettes, uncomplicated: Secondary | ICD-10-CM | POA: Diagnosis not present

## 2022-11-27 DIAGNOSIS — M25612 Stiffness of left shoulder, not elsewhere classified: Secondary | ICD-10-CM | POA: Diagnosis not present

## 2022-11-27 DIAGNOSIS — Z87891 Personal history of nicotine dependence: Secondary | ICD-10-CM | POA: Diagnosis not present

## 2022-11-27 DIAGNOSIS — Z122 Encounter for screening for malignant neoplasm of respiratory organs: Secondary | ICD-10-CM | POA: Diagnosis not present

## 2022-11-27 NOTE — Therapy (Signed)
OUTPATIENT OCCUPATIONAL THERAPY ORTHO TREATMENT NOTE  Patient Name: Leah Lynch MRN: [id number] DOB:11-12-1945, 77 y.o., female Today's Date: 11/27/2022   END OF SESSION:  OT End of Session - 11/27/22 1125     Visit Number 7    Number of Visits 8    Date for OT Re-Evaluation 12/04/22    Authorization Type [employer]    Authorization Time Period no visit limit    Progress Note Due on Visit 10    OT Start Time 1038    OT Stop Time 1116    OT Time Calculation (min) 38 min    Activity Tolerance Patient tolerated treatment well    Behavior During Therapy WFL for tasks assessed/performed             Past Medical History:  Diagnosis Date   Fibrocystic breast    GERD (gastroesophageal reflux disease)    Hyperlipidemia    Hypertension    Osteoarthritis    Osteoporosis    PONV (postoperative nausea and vomiting)    Pre-diabetes    Shortness of breath dyspnea    Past Surgical History:  Procedure Laterality Date   ABDOMINAL HYSTERECTOMY     BIOPSY  04/15/2018   Procedure: BIOPSY;  Surgeon: Corbin Ade, MD;  [facility];  Service: Endoscopy;;  colon   BREAST CYST EXCISION Right    BREAST SURGERY     removal of cyst from right breast-fibrocystsic   BUNIONECTOMY  12/19/2010   Procedure: BUNIONECTOMY;  Surgeon: Dallas Schimke;  Location: [facility];  Service: Orthopedics;  Laterality: Right;  [name], [name] Bunionectomy   BUNIONECTOMY  05/22/2011   Procedure: Arbutus Leas;  Surgeon: Dallas Schimke, DPM;  Location: [facility];  Service: Orthopedics;  Laterality: Left;  [name] Bunionectomy Left Foot   CAPSULOTOMY  01/29/2012   Procedure: CAPSULOTOMY;  Surgeon: Dallas Schimke, DPM;  Location: [facility];  Service: Orthopedics;  Laterality: Left;   CATARACT EXTRACTION W/PHACO Right 08/09/2015   Procedure: CATARACT EXTRACTION PHACO AND INTRAOCULAR LENS PLACEMENT RIGHT EYE CDE=7.95;  Surgeon: Gemma Payor, MD;  Location: [facility];   Service: Ophthalmology;  Laterality: Right;   CATARACT EXTRACTION W/PHACO Left 08/27/2015   Procedure: CATARACT EXTRACTION PHACO AND INTRAOCULAR LENS PLACEMENT LEFT EYE; CDE:  6.12;  Surgeon: Gemma Payor, MD;  Location: [facility];  Service: Ophthalmology;  Laterality: Left;   COLONOSCOPY  2012   Dr. Jena Gauss: internal hemorrhoids, hyperplastic polyps. Surveillance 2022   COLONOSCOPY WITH PROPOFOL N/A 04/15/2018   one 7 mm polyp at splenic flexure, one 4 mm polyp in rectum, otherwise normal. S/p segmental biopsy:normal. Normal TI   DIAGNOSTIC LAPAROSCOPY     ESOPHAGOGASTRODUODENOSCOPY (EGD) WITH PROPOFOL N/A 04/15/2018   normal   FOOT GANGLION EXCISION     right foot   METATARSAL OSTEOTOMY  05/22/2011   Procedure: METATARSAL OSTEOTOMY;  Surgeon: Dallas Schimke, DPM;  Location: [facility];  Service: Orthopedics;  Laterality: Left;  [name] Osteotomy Left Foot   POLYPECTOMY  04/15/2018   Procedure: POLYPECTOMY;  Surgeon: Corbin Ade, MD;  [facility];  Service: Endoscopy;;  colon   REVERSE SHOULDER ARTHROPLASTY Left 09/08/2022   Procedure: LEFT REVERSE SHOULDER ARTHROPLASTY;  Surgeon: Oliver Barre, MD;  Location: [facility];  Service: Orthopedics;  Laterality: Left;  RNFA NEEDED   TONSILLECTOMY  age 71   TRIGGER FINGER RELEASE Right 01/28/2018   Procedure: RIGHT TRIGGER THUMB RELEASE;  Surgeon: Vickki Hearing, MD;  Location: [facility];  Service:  Orthopedics;  Laterality: Right;   TUBAL LIGATION     Patient Active Problem List   Diagnosis Date Noted   Need for influenza vaccination 11/18/2022   Controlled type 2 diabetes mellitus without complication, without long-term current use of insulin (HCC) 09/30/2022   Rotator cuff arthropathy of left shoulder 09/08/2022   Essential hypertension 09/03/2022   GERD (gastroesophageal reflux disease) 09/03/2022   Hyperlipidemia 09/03/2022   Osteoporosis 09/03/2022   Chronic musculoskeletal pain 09/03/2022   Rotator cuff arthropathy, left  09/03/2022   Screening for lung cancer 09/03/2022   Early satiety 02/22/2018   Loss of weight 02/22/2018   S/P trigger finger release right thumb 11/141/9 02/08/2018   Diarrhea 11/12/2016   Abdominal pain 11/12/2016   Nausea without vomiting 11/12/2016    PCP: Dr. Christel Mormon REFERRING PROVIDER: Dr. Thane Edu  ONSET DATE: 09/08/22  REFERRING DIAG: s/p left reverse TSA  THERAPY DIAG:  Stiffness of left shoulder, not elsewhere classified  Acute pain of left shoulder  Other symptoms and signs involving the musculoskeletal system  Rationale for Evaluation and Treatment: Rehabilitation  SUBJECTIVE:   SUBJECTIVE STATEMENT: S: I'm really hurting today Pt accompanied by: self  PERTINENT HISTORY: Pt is a 77 y/o female s/p left reverse TSA on 09/08/22. Pt received HH therapy for several weeks, stopped approximately 1 week ago.   PRECAUTIONS: Shoulder See Protocol  WEIGHT BEARING RESTRICTIONS: Yes NWB  PAIN:  Are you having pain? Yes: NPRS scale: 7/10 Pain location: anterior shoulder joint Pain description: aching and pulling Aggravating factors: movement Relieving factors: medicine  FALLS: Has patient fallen in last 6 months? Yes. Number of falls 1  PLOF: Independent  PATIENT GOALS: To be able to use the LUE.   NEXT MD VISIT: 12/02/22  OBJECTIVE:   HAND DOMINANCE: Right  ADLs: Overall ADLs: Pt reports she is having difficulty moving her arm for most tasks. Pt has difficulty with reaching up overhead and behind her back. She is able to dress herself, is unable to reach back behind her for pulling pants or underwear over hips. Pt is not lifting anything of weight, does not lift pots and pans. Pt is sleeping in the bed, on average sleeps ok, has difficulty when she is hurting.   FUNCTIONAL OUTCOME MEASURES: FOTO: 50/100  UPPER EXTREMITY ROM:       Assessed in supine, er/IR adducted  Passive ROM Left eval  Shoulder flexion 120  Shoulder abduction 142   Shoulder internal rotation 90  Shoulder external rotation 35  (Blank rows = not tested)  Assessed in seated, er/IR adducted  Active ROM Left eval  Shoulder flexion 102  Shoulder abduction 76  Shoulder internal rotation 90  Shoulder external rotation 0  (Blank rows = not tested)    UPPER EXTREMITY MMT:     Assessed in sitting, er/IR adducted-via observation on eval   MMT Left eval  Shoulder flexion 3-/5  Shoulder abduction 3-/5  Shoulder internal rotation 3/5  Shoulder external rotation 3-/5  (Blank rows = not tested)  SENSATION: WFL  EDEMA: occasional with weather changes  COGNITION: Overall cognitive status: Within functional limits for tasks assessed  OBSERVATIONS: mod fascial restrictions along upper arm, anterior shoulder, and trapezius regions   TODAY'S TREATMENT:  DATE:   11/27/22 -Manual Therapy: myofascial release and trigger point applied to the bicep, deltoid, trapezius, scapular region and axillary region in order to reduce fascial restrictions and pain in order to improve ROM. -A/ROM: supine, flexion, abduction, protraction, horizontal abduction, er/IR, x12 -Wall Slides: flexion, abduction, x10 -Scapular ROM: elevation/depression, retraction/protraction,  rows, x10  11/25/22 -Manual Therapy: myofascial release and trigger point applied to the bicep, deltoid, trapezius, scapular region and axillary region in order to reduce fascial restrictions and pain in order to improve ROM.  -AA/ROM: supine, flexion, abduction, protraction, horizontal abduction, er/IR, x15 -A/ROM: supine, flexion, abduction, protraction, horizontal abduction, er/IR, x10 -Wall Slides: flexion, abduction, x8  11/21/22 -Manual Therapy: myofascial release and trigger point applied to the bicep, deltoid, trapezius, scapular region and axillary region in order to  reduce fascial restrictions and pain in order to improve ROM.  -AA/ROM: supine, flexion, abduction, protraction, horizontal abduction, er/IR, x15 -A/ROM: supine, flexion, abduction, protraction, horizontal abduction, er/IR, x10 -Wall Wash x60" -Thumb tacks x60"   PATIENT EDUCATION: Education details: Continue HEP Person educated: Patient Education method: Explanation, Demonstration, and Handouts Education comprehension: verbalized understanding and returned demonstration  HOME EXERCISE PROGRAM: Eval: AA/ROM 9/4: A/ROM 9/10: Wall Slides  GOALS: Goals reviewed with patient? Yes   SHORT TERM GOALS: Target date: 12/05/22  Pt will be provided with and educated on HEP to improve mobility in LUE required for use during ADL completion.   Goal status: IN PROGRESS  2.  Pt will increase LUE A/ROM by 30+ degrees to improve ability to use LUE during dressing tasks with minimal compensatory techniques.   Goal status: IN PROGRESS  3.  Pt will increase LUE strength to 4/5 to improve ability to lift and move pots and pans during meal preparation and cleanup tasks.   Goal status: IN PROGRESS  4.  Pt will decrease pain in LUE to 3/10 or less to improve ability to sleep for 2+ consecutive hours without waking due to pain  Goal status: IN PROGRESS  5.  Pt will decrease LUE fascial restrictions to min amounts or less to improve mobility required for functional reaching tasks.      Goal status: IN PROGRESS   ASSESSMENT:  CLINICAL IMPRESSION: Pt is making good progress this session. She was able to achieve 85% of full A/ROM, however this exercise required increased effort and rest breaks due to muscle fatigue. Pt was also able to tolerate wall slides more this session, completing them more efficiently with no rest break. OT added scapular ROM to prepare for scapular strengthening in the next sessions, which she tolerated well with max cuing for positioning. Verbal and tactile cuing provided  throughout session for positioning and technique.   PERFORMANCE DEFICITS: in functional skills including in functional skills including ADLs, IADLs, coordination, tone, ROM, strength, pain, fascial restrictions, muscle spasms, and UE functional use   PLAN:  OT FREQUENCY: 2x/week  OT DURATION: 4 weeks  PLANNED INTERVENTIONS: self care/ADL training, therapeutic exercise, therapeutic activity, neuromuscular re-education, manual therapy, passive range of motion, splinting, electrical stimulation, ultrasound, moist heat, cryotherapy, patient/family education, and DME and/or AE instructions  RECOMMENDED OTHER SERVICES: None at this time  CONSULTED AND AGREED WITH PLAN OF CARE: Patient  PLAN FOR NEXT SESSION: Follow up on HEP, manual therapy, A/ROM, Start scapular strengthening   Trish Mage, OTR/L  [phone] 11/27/2022, 11:26 AM

## 2022-12-02 ENCOUNTER — Ambulatory Visit: Payer: 59 | Admitting: Orthopedic Surgery

## 2022-12-02 ENCOUNTER — Ambulatory Visit (INDEPENDENT_AMBULATORY_CARE_PROVIDER_SITE_OTHER): Payer: 59 | Admitting: Orthopedic Surgery

## 2022-12-02 ENCOUNTER — Encounter: Payer: Self-pay | Admitting: Orthopedic Surgery

## 2022-12-02 ENCOUNTER — Other Ambulatory Visit (INDEPENDENT_AMBULATORY_CARE_PROVIDER_SITE_OTHER): Payer: 59

## 2022-12-02 ENCOUNTER — Ambulatory Visit (INDEPENDENT_AMBULATORY_CARE_PROVIDER_SITE_OTHER): Payer: 59 | Admitting: Acute Care

## 2022-12-02 ENCOUNTER — Encounter: Payer: Self-pay | Admitting: Acute Care

## 2022-12-02 VITALS — BP 97/50 | HR 71 | Ht 59.0 in | Wt 141.0 lb

## 2022-12-02 DIAGNOSIS — F17210 Nicotine dependence, cigarettes, uncomplicated: Secondary | ICD-10-CM

## 2022-12-02 DIAGNOSIS — Z96612 Presence of left artificial shoulder joint: Secondary | ICD-10-CM | POA: Diagnosis not present

## 2022-12-02 DIAGNOSIS — M12812 Other specific arthropathies, not elsewhere classified, left shoulder: Secondary | ICD-10-CM

## 2022-12-02 NOTE — Progress Notes (Signed)
Orthopaedic Postop Note  Assessment: Leah Lynch is a 77 y.o. female s/p Left Reverse Shoulder Arthroplasty  DOS: 09/08/2022  Plan: Leah Lynch is doing well overall.  She may have some residual stiffness, but I anticipate that this will gradually resolve as she initiate strengthening exercises.  I provided her reassurance.  She states her understanding.  Continue per the protocol.  Medicines as needed.  Follow-up in 3 months.    Follow-up: Return in about 3 months (around 03/03/2023).  XR at next visit: Left shoulder  Subjective:  Chief Complaint  Patient presents with   Post-op Follow-up    Left shoulder replaced / improving some 09/08/22 date of surgery    History of Present Illness: Leah Lynch is a 77 y.o. female who presents following the above stated procedure.  Surgery was approximately 3 months ago.  She is working appropriately with physical therapy.  Her motion is getting better.  She still has some good days and bad days.  She has not started working on strengthening.  She has no numbness or tingling.   Review of Systems: No fevers or chills No numbness or tingling No Chest Pain No shortness of breath   Objective: BP (!) 97/50   Pulse 71   Ht 4\' 11"  (1.499 m)   Wt 141 lb (64 kg)   BMI 28.48 kg/m   Physical Exam:  Alert and oriented.  No acute distress.  Surgical incision is healed.  No surrounding erythema or drainage.  Sensation intact in the axillary nerve distribution.  125 degrees of forward flexion.  100 degrees of abduction.  Internal rotation to her back pocket.  Restricted external rotation passively compared to the contralateral side.   IMAGING: I personally ordered and reviewed the following images:  X-rays of the left shoulder were obtained in clinic today.  These are compared to prior x-rays.  Reverse shoulder arthroplasty remains in stable position.  There is no evidence of subsidence.  No apparent loosening.  No lucency  around the prostheses.  No bony lesions.  Impression: Stable left reverse shoulder arthroplasty   Oliver Barre, MD 12/02/2022 12:05 PM

## 2022-12-02 NOTE — Progress Notes (Signed)
Virtual Visit via Telephone Note  I connected with Leah Lynch on 12/02/22 at  1:30 PM EDT by telephone and verified that I am speaking with the correct person using two identifiers.  Location: Patient:  At home  Provider: [address] [place], [place], [address]    I discussed the limitations, risks, security and privacy concerns of performing an evaluation and management service by telephone and the availability of in person appointments. I also discussed with the patient that there may be a patient responsible charge related to this service. The patient expressed understanding and agreed to proceed.   Shared Decision Making Visit Lung Cancer Screening Program [phone])   Eligibility: Age 77 y.o. Pack Years Smoking History Calculation 75 pack year smoking history (# packs/per year x # years smoked) Recent History of coughing up blood  no Unexplained weight loss? no ( >Than 15 pounds within the last 6 months ) Prior History Lung / other cancer no (Diagnosis within the last 5 years already requiring surveillance chest CT Scans). Smoking Status Current Smoker Former Smokers: Years since quit:  NA  Quit Date:  NA  Visit Components: Discussion included one or more decision making aids. yes Discussion included risk/benefits of screening. yes Discussion included potential follow up diagnostic testing for abnormal scans. yes Discussion included meaning and risk of over diagnosis. yes Discussion included meaning and risk of False Positives. yes Discussion included meaning of total radiation exposure. yes  Counseling Included: Importance of adherence to annual lung cancer LDCT screening. yes Impact of comorbidities on ability to participate in the program. yes Ability and willingness to under diagnostic treatment. yes  Smoking Cessation Counseling: Current Smokers:  Discussed importance of smoking cessation. yes Information about tobacco cessation classes and  interventions provided to patient. yes Patient provided with "ticket" for LDCT Scan. yes Symptomatic Patient. no  Counseling NA Diagnosis Code: Tobacco Use Z72.0 Asymptomatic Patient yes  Counseling (Intermediate counseling: > three minutes counseling) H8469 Former Smokers:  Discussed the importance of maintaining cigarette abstinence. yes Diagnosis Code: Personal History of Nicotine Dependence. G29.528 Information about tobacco cessation classes and interventions provided to patient. Yes Patient provided with "ticket" for LDCT Scan. yes Written Order for Lung Cancer Screening with LDCT placed in Epic. Yes (CT Chest Lung Cancer Screening Low Dose W/O CM) [id number] Z12.2-Screening of respiratory organs Z87.891-Personal history of nicotine dependence  I have spent 25 minutes of face to face/ virtual visit   time with  Leah Lynch discussing the risks and benefits of lung cancer screening. We viewed / discussed a power point together that explained in detail the above noted topics. We paused at intervals to allow for questions to be asked and answered to ensure understanding.We discussed that the single most powerful action that she can take to decrease her risk of developing lung cancer is to quit smoking. We discussed whether or not she is ready to commit to setting a quit date. We discussed options for tools to aid in quitting smoking including nicotine replacement therapy, non-nicotine medications, support groups, Quit Smart classes, and behavior modification. We discussed that often times setting smaller, more achievable goals, such as eliminating 1 cigarette a day for a week and then 2 cigarettes a day for a week can be helpful in slowly decreasing the number of cigarettes smoked. This allows for a sense of accomplishment as well as providing a clinical benefit. I provided  her  with smoking cessation  information  with contact information for community resources,  classes, free nicotine  replacement therapy, and access to mobile apps, text messaging, and on-line smoking cessation help. I have also provided  her  the office contact information in the event she needs to contact me, or the screening staff. We discussed the time and location of the scan, and that either Leah Miyamoto RN, Leah Lemon, RN  or I will call / send a letter with the results within 24-72 hours of receiving them. The patient verbalized understanding of all of  the above and had no further questions upon leaving the office. They have my contact information in the event they have any further questions.  I spent 3-4 minutes counseling on smoking cessation and the health risks of continued tobacco abuse.  I explained to the patient that there has been a high incidence of coronary artery disease noted on these exams. I explained that this is a non-gated exam therefore degree or severity cannot be determined. This patient is on statin therapy. I have asked the patient to follow-up with their PCP regarding any incidental finding of coronary artery disease and management with diet or medication as their PCP  feels is clinically indicated. The patient verbalized understanding of the above and had no further questions upon completion of the visit.      Leah Ngo, NP 12/02/2022

## 2022-12-02 NOTE — Patient Instructions (Signed)

## 2022-12-03 ENCOUNTER — Ambulatory Visit (HOSPITAL_COMMUNITY): Payer: 59 | Admitting: Occupational Therapy

## 2022-12-03 ENCOUNTER — Ambulatory Visit (HOSPITAL_COMMUNITY)
Admission: RE | Admit: 2022-12-03 | Discharge: 2022-12-03 | Disposition: A | Discharge status: Home / Self Care | Payer: 59 | Source: Ambulatory Visit | Attending: Acute Care | Admitting: Acute Care

## 2022-12-03 ENCOUNTER — Encounter (HOSPITAL_COMMUNITY): Payer: Self-pay | Admitting: Occupational Therapy

## 2022-12-03 DIAGNOSIS — M25612 Stiffness of left shoulder, not elsewhere classified: Secondary | ICD-10-CM | POA: Diagnosis not present

## 2022-12-03 DIAGNOSIS — Z122 Encounter for screening for malignant neoplasm of respiratory organs: Secondary | ICD-10-CM | POA: Insufficient documentation

## 2022-12-03 DIAGNOSIS — F17210 Nicotine dependence, cigarettes, uncomplicated: Secondary | ICD-10-CM | POA: Insufficient documentation

## 2022-12-03 DIAGNOSIS — Z87891 Personal history of nicotine dependence: Secondary | ICD-10-CM | POA: Insufficient documentation

## 2022-12-03 DIAGNOSIS — R29898 Other symptoms and signs involving the musculoskeletal system: Secondary | ICD-10-CM | POA: Diagnosis not present

## 2022-12-03 DIAGNOSIS — M25512 Pain in left shoulder: Secondary | ICD-10-CM | POA: Diagnosis not present

## 2022-12-03 NOTE — Patient Instructions (Signed)
1) Flexion Wall Stretch    Face wall, place affected handon wall in front of you. Slide hand up the wall  and lean body in towards the wall. Hold for 10 seconds. Repeat 3-5 times. 1-2 times/day.     2) Towel Stretch with Internal Rotation      Gently pull up (or to the side) your affected arm  behind your back with the assist of a towel. Hold 10 seconds, repeat 3-5 times. 1-2 times/day.             3) Corner Stretch    Stand at a corner of a wall, place your arms on the walls with elbows bent. Lean into the corner until a stretch is felt along the front of your chest and/or shoulders. Hold for 10 seconds. Repeat 3-5X, 1-2 times/day.    4) Posterior Capsule Stretch    Bring the involved arm across chest. Grasp elbow and pull toward chest until you feel a stretch in the back of the upper arm and shoulder. Hold 10 seconds. Repeat 3-5X. Complete 1-2 times/day.

## 2022-12-03 NOTE — Therapy (Signed)
OUTPATIENT OCCUPATIONAL THERAPY ORTHO TREATMENT NOTE REASSESSMENT & RECERTIFICATION  Patient Name: Leah Lynch MRN: [id number] DOB:1945-07-21, 77 y.o., female Today's Date: 12/03/2022   Progress Note Reporting Period 11/04/22 to 12/03/22  See note below for Objective Data and Assessment of Progress/Goals.      END OF SESSION:  OT End of Session - 12/03/22 1144     Visit Number 8    Number of Visits 16    Date for OT Re-Evaluation 01/02/23    Authorization Type [employer]    Authorization Time Period no visit limit    Progress Note Due on Visit 18    OT Start Time 1102    OT Stop Time 1140    OT Time Calculation (min) 38 min    Activity Tolerance Patient tolerated treatment well    Behavior During Therapy WFL for tasks assessed/performed              Past Medical History:  Diagnosis Date   Fibrocystic breast    GERD (gastroesophageal reflux disease)    Hyperlipidemia    Hypertension    Osteoarthritis    Osteoporosis    PONV (postoperative nausea and vomiting)    Pre-diabetes    Shortness of breath dyspnea    Past Surgical History:  Procedure Laterality Date   ABDOMINAL HYSTERECTOMY     BIOPSY  04/15/2018   Procedure: BIOPSY;  Surgeon: Corbin Ade, MD;  [facility]: [facility];  Service: Endoscopy;;  colon   BREAST CYST EXCISION Right    BREAST SURGERY     removal of cyst from right breast-fibrocystsic   BUNIONECTOMY  12/19/2010   Procedure: BUNIONECTOMY;  Surgeon: Dallas Schimke;  Location: [facility];  Service: Orthopedics;  Laterality: Right;  [name], [name] Bunionectomy   BUNIONECTOMY  05/22/2011   Procedure: Arbutus Leas;  Surgeon: Dallas Schimke, DPM;  Location: [facility];  Service: Orthopedics;  Laterality: Left;  [name] Bunionectomy Left Foot   CAPSULOTOMY  01/29/2012   Procedure: CAPSULOTOMY;  Surgeon: Dallas Schimke, DPM;  Location: [facility];  Service: Orthopedics;  Laterality: Left;   CATARACT EXTRACTION  W/PHACO Right 08/09/2015   Procedure: CATARACT EXTRACTION PHACO AND INTRAOCULAR LENS PLACEMENT RIGHT EYE CDE=7.95;  Surgeon: Gemma Payor, MD;  Location: [facility];  Service: Ophthalmology;  Laterality: Right;   CATARACT EXTRACTION W/PHACO Left 08/27/2015   Procedure: CATARACT EXTRACTION PHACO AND INTRAOCULAR LENS PLACEMENT LEFT EYE; CDE:  6.12;  Surgeon: Gemma Payor, MD;  Location: [facility];  Service: Ophthalmology;  Laterality: Left;   COLONOSCOPY  2012   Dr. Jena Gauss: internal hemorrhoids, hyperplastic polyps. Surveillance 2022   COLONOSCOPY WITH PROPOFOL N/A 04/15/2018   one 7 mm polyp at splenic flexure, one 4 mm polyp in rectum, otherwise normal. S/p segmental biopsy:normal. Normal TI   DIAGNOSTIC LAPAROSCOPY     ESOPHAGOGASTRODUODENOSCOPY (EGD) WITH PROPOFOL N/A 04/15/2018   normal   FOOT GANGLION EXCISION     right foot   METATARSAL OSTEOTOMY  05/22/2011   Procedure: METATARSAL OSTEOTOMY;  Surgeon: Dallas Schimke, DPM;  Location: [facility];  Service: Orthopedics;  Laterality: Left;  [name] Osteotomy Left Foot   POLYPECTOMY  04/15/2018   Procedure: POLYPECTOMY;  Surgeon: Corbin Ade, MD;  [facility]: [facility];  Service: Endoscopy;;  colon   REVERSE SHOULDER ARTHROPLASTY Left 09/08/2022   Procedure: LEFT REVERSE SHOULDER ARTHROPLASTY;  Surgeon: Oliver Barre, MD;  Location: [facility];  Service: Orthopedics;  Laterality: Left;  RNFA NEEDED   TONSILLECTOMY  age  14   TRIGGER FINGER RELEASE Right 01/28/2018   Procedure: RIGHT TRIGGER THUMB RELEASE;  Surgeon: Vickki Hearing, MD;  Location: [facility];  Service: Orthopedics;  Laterality: Right;   TUBAL LIGATION     Patient Active Problem List   Diagnosis Date Noted   Need for influenza vaccination 11/18/2022   Controlled type 2 diabetes mellitus without complication, without long-term current use of insulin (HCC) 09/30/2022   Rotator cuff arthropathy of left shoulder 09/08/2022   Essential hypertension 09/03/2022   GERD (gastroesophageal  reflux disease) 09/03/2022   Hyperlipidemia 09/03/2022   Osteoporosis 09/03/2022   Chronic musculoskeletal pain 09/03/2022   Rotator cuff arthropathy, left 09/03/2022   Screening for lung cancer 09/03/2022   Early satiety 02/22/2018   Loss of weight 02/22/2018   S/P trigger finger release right thumb 11/141/9 02/08/2018   Diarrhea 11/12/2016   Abdominal pain 11/12/2016   Nausea without vomiting 11/12/2016    PCP: Dr. Christel Mormon REFERRING PROVIDER: Dr. Thane Edu  ONSET DATE: 09/08/22  REFERRING DIAG: s/p left reverse TSA  THERAPY DIAG:  Stiffness of left shoulder, not elsewhere classified  Acute pain of left shoulder  Other symptoms and signs involving the musculoskeletal system  Rationale for Evaluation and Treatment: Rehabilitation  SUBJECTIVE:   SUBJECTIVE STATEMENT: S: I went to the doctor yesterday and everything is great.   PERTINENT HISTORY: Pt is a 77 y/o female s/p left reverse TSA on 09/08/22. Pt received HH therapy for several weeks, stopped approximately 1 week ago.   PRECAUTIONS: Shoulder See Protocol  WEIGHT BEARING RESTRICTIONS: Yes NWB  PAIN:  Are you having pain? No  FALLS: Has patient fallen in last 6 months? Yes. Number of falls 1  PLOF: Independent  PATIENT GOALS: To be able to use the LUE.   NEXT MD VISIT: 03/03/23  OBJECTIVE:   HAND DOMINANCE: Right  ADLs: Overall ADLs: Pt reports she is having difficulty moving her arm for most tasks. Pt has difficulty with reaching up overhead and behind her back. She is able to dress herself, is unable to reach back behind her for pulling pants or underwear over hips. Pt is not lifting anything of weight, does not lift pots and pans. Pt is sleeping in the bed, on average sleeps ok, has difficulty when she is hurting.   FUNCTIONAL OUTCOME MEASURES: FOTO: 50/100 9/18: 56/100  UPPER EXTREMITY ROM:       Assessed in supine, er/IR adducted  Passive ROM Left eval Left 12/03/22  Shoulder flexion  120 141  Shoulder abduction 142 145  Shoulder internal rotation 90 90  Shoulder external rotation 35 45  (Blank rows = not tested)  Assessed in seated, er/IR adducted  Active ROM Left eval Left 12/03/22  Shoulder flexion 102 105  Shoulder abduction 76 112  Shoulder internal rotation 90 90  Shoulder external rotation 0 6  (Blank rows = not tested)    UPPER EXTREMITY MMT:     Assessed in sitting, er/IR adducted-via observation on eval  9/18: Assessed in sitting, er/IR adducted  MMT Left eval Left 12/03/22  Shoulder flexion 3-/5 4-/5  Shoulder abduction 3-/5 4-/5  Shoulder internal rotation 3/5 4/5  Shoulder external rotation 3-/5 3-/5  (Blank rows = not tested)  EDEMA: occasional with weather changes  OBSERVATIONS: mod fascial restrictions along upper arm, anterior shoulder, and trapezius regions   TODAY'S TREATMENT:  DATE:   12/03/22 -Manual Therapy: myofascial release and trigger point applied to the bicep, deltoid, trapezius, scapular region and axillary region in order to reduce fascial restrictions and pain in order to improve ROM. -P/ROM: flexion, abduction, er/IR, horizontal abduction, 5 reps -Shoulder stretches: corner stretch, flexion, IR behind back with horizontal towel, cross chest stretch, 2x10" holds -AA/ROM: protraction, flexion, abduction, er, horizontal abduction, 10 reps  11/27/22 -Manual Therapy: myofascial release and trigger point applied to the bicep, deltoid, trapezius, scapular region and axillary region in order to reduce fascial restrictions and pain in order to improve ROM. -A/ROM: supine, flexion, abduction, protraction, horizontal abduction, er/IR, x12 -Wall Slides: flexion, abduction, x10 -Scapular ROM: elevation/depression, retraction/protraction,  rows, x10  11/25/22 -Manual Therapy: myofascial release and trigger  point applied to the bicep, deltoid, trapezius, scapular region and axillary region in order to reduce fascial restrictions and pain in order to improve ROM.  -AA/ROM: supine, flexion, abduction, protraction, horizontal abduction, er/IR, x15 -A/ROM: supine, flexion, abduction, protraction, horizontal abduction, er/IR, x10 -Wall Slides: flexion, abduction, x8    PATIENT EDUCATION: Education details: shoulder stretches Person educated: Patient Education method: Explanation, Demonstration, and Handouts Education comprehension: verbalized understanding and returned demonstration  HOME EXERCISE PROGRAM: Eval: AA/ROM 9/4: A/ROM 9/10: Wall Slides 9/18: shoulder stretches  GOALS: Goals reviewed with patient? Yes   SHORT TERM GOALS: Target date: 12/05/22  Pt will be provided with and educated on HEP to improve mobility in LUE required for use during ADL completion.   Goal status: IN PROGRESS  2.  Pt will increase LUE A/ROM by 30+ degrees to improve ability to use LUE during dressing tasks with minimal compensatory techniques.   Goal status: IN PROGRESS  3.  Pt will increase LUE strength to 4/5 to improve ability to lift and move pots and pans during meal preparation and cleanup tasks.   Goal status: IN PROGRESS  4.  Pt will decrease pain in LUE to 3/10 or less to improve ability to sleep for 2+ consecutive hours without waking due to pain  Goal status: IN PROGRESS  5.  Pt will decrease LUE fascial restrictions to min amounts or less to improve mobility required for functional reaching tasks.      Goal status: IN PROGRESS   ASSESSMENT:  CLINICAL IMPRESSION: Pt reports she can do some things now that she could  not do before. Reassessment completed this session, pt is making progress towards goals, has not met any goals at this time. Pt is demonstrating improved ROM and strength, as well as improved functional use during ADLs. Flexion is the most limited plane at this time.  Reviewed HEP and added shoulder stretches. Verbal cuing for form and technique. Pt reports MD has released her to reach behind her back and to begin working towards strengthening with a theraband.   PERFORMANCE DEFICITS: in functional skills including in functional skills including ADLs, IADLs, coordination, tone, ROM, strength, pain, fascial restrictions, muscle spasms, and UE functional use   PLAN:  OT FREQUENCY: 2x/week  OT DURATION: 4 weeks  PLANNED INTERVENTIONS: self care/ADL training, therapeutic exercise, therapeutic activity, neuromuscular re-education, manual therapy, passive range of motion, splinting, electrical stimulation, ultrasound, moist heat, cryotherapy, patient/family education, and DME and/or AE instructions  CONSULTED AND AGREED WITH PLAN OF CARE: Patient  PLAN FOR NEXT SESSION: Follow up on HEP, manual therapy, A/ROM, scapular strengthening, shoulder stretches   Ezra Sites, OTR/L  [phone] 12/03/2022, 11:45 AM

## 2022-12-05 ENCOUNTER — Encounter (HOSPITAL_COMMUNITY): Payer: 59 | Admitting: Occupational Therapy

## 2022-12-08 DIAGNOSIS — Z79899 Other long term (current) drug therapy: Secondary | ICD-10-CM | POA: Diagnosis not present

## 2022-12-08 DIAGNOSIS — M19049 Primary osteoarthritis, unspecified hand: Secondary | ICD-10-CM | POA: Diagnosis not present

## 2022-12-08 DIAGNOSIS — G8929 Other chronic pain: Secondary | ICD-10-CM | POA: Diagnosis not present

## 2022-12-08 DIAGNOSIS — R5383 Other fatigue: Secondary | ICD-10-CM | POA: Diagnosis not present

## 2022-12-08 DIAGNOSIS — Z131 Encounter for screening for diabetes mellitus: Secondary | ICD-10-CM | POA: Diagnosis not present

## 2022-12-08 DIAGNOSIS — M545 Low back pain, unspecified: Secondary | ICD-10-CM | POA: Diagnosis not present

## 2022-12-08 DIAGNOSIS — Z1159 Encounter for screening for other viral diseases: Secondary | ICD-10-CM | POA: Diagnosis not present

## 2022-12-08 DIAGNOSIS — M25569 Pain in unspecified knee: Secondary | ICD-10-CM | POA: Diagnosis not present

## 2022-12-08 DIAGNOSIS — E7800 Pure hypercholesterolemia, unspecified: Secondary | ICD-10-CM | POA: Diagnosis not present

## 2022-12-08 DIAGNOSIS — M129 Arthropathy, unspecified: Secondary | ICD-10-CM | POA: Diagnosis not present

## 2022-12-08 DIAGNOSIS — E559 Vitamin D deficiency, unspecified: Secondary | ICD-10-CM | POA: Diagnosis not present

## 2022-12-09 ENCOUNTER — Ambulatory Visit (INDEPENDENT_AMBULATORY_CARE_PROVIDER_SITE_OTHER): Payer: 59

## 2022-12-09 VITALS — Ht 59.0 in | Wt 141.0 lb

## 2022-12-09 DIAGNOSIS — Z0000 Encounter for general adult medical examination without abnormal findings: Secondary | ICD-10-CM | POA: Diagnosis not present

## 2022-12-09 DIAGNOSIS — Z1159 Encounter for screening for other viral diseases: Secondary | ICD-10-CM

## 2022-12-09 NOTE — Progress Notes (Signed)
Because this visit was a virtual/telehealth visit,  certain criteria was not obtained, such a blood pressure, CBG if applicable, and timed get up and go. Any medications not marked as "taking" were not mentioned during the medication reconciliation part of the visit. Any vitals not documented were not able to be obtained due to this being a telehealth visit or patient was unable to self-report a recent blood pressure reading due to a lack of equipment at home via telehealth. Vitals that have been documented are verbally provided by the patient.   Subjective:   Leah Lynch is a 77 y.o. female who presents for an Initial Medicare Annual Wellness Visit.  Visit Complete: Virtual  I connected with  Leah Lynch on 12/09/22 by a audio enabled telemedicine application and verified that I am speaking with the correct person using two identifiers.  Patient Location: Home  Provider Location: Home Office  I discussed the limitations of evaluation and management by telemedicine. The patient expressed understanding and agreed to proceed.  Patient Medicare AWV questionnaire was completed by the patient on n/a; I have confirmed that all information answered by patient is correct and no changes since this date.  Cardiac Risk Factors include: advanced age (>33men, >69 women);dyslipidemia;hypertension;sedentary lifestyle     Objective:    Today's Vitals   12/09/22 1505 12/09/22 1508  Weight: 141 lb (64 kg)   Height: 4\' 11"  (1.499 m)   PainSc:  8    Body mass index is 28.48 kg/m.     12/09/2022    3:04 PM 11/04/2022   11:40 AM 09/08/2022    6:40 AM 09/02/2022    1:41 PM 12/13/2018    5:48 PM 01/28/2018   11:24 AM 01/26/2018    3:15 PM  Advanced Directives  Does Patient Have a Medical Advance Directive? No No No No No Yes Yes  Type of Advance Directive      Living will Living will  Does patient want to make changes to medical advance directive?      No - Patient declined No -  Patient declined  Would patient like information on creating a medical advance directive? Yes (MAU/Ambulatory/Procedural Areas - Information given) No - Patient declined No - Patient declined No - Patient declined       Current Medications (verified) Outpatient Encounter Medications as of 12/09/2022  Medication Sig   atenolol (TENORMIN) 25 MG tablet Take 25 mg by mouth daily.   atorvastatin (LIPITOR) 80 MG tablet Take 1 tablet (80 mg total) by mouth daily.   chlorthalidone (HYGROTON) 25 MG tablet Take 25 mg by mouth daily.   furosemide (LASIX) 40 MG tablet Take 40 mg by mouth daily.   gabapentin (NEURONTIN) 300 MG capsule Take 300 mg by mouth 3 (three) times daily as needed (pain).   HYDROcodone-acetaminophen (NORCO) 10-325 MG tablet Take 1 tablet by mouth every 6 (six) hours as needed.   lisinopril (ZESTRIL) 2.5 MG tablet TAKE (1) TABLET BY MOUTH ONCE DAILY.   Multiple Vitamin (MULTIVITAMIN) tablet Take 1 tablet by mouth daily. Centrum silver woman+   naproxen (NAPROSYN) 500 MG tablet Take 500 mg by mouth 2 (two) times daily with a meal.   nitroGLYCERIN (NITROSTAT) 0.4 MG SL tablet Place 0.4 mg under the tongue every 5 (five) minutes as needed for chest pain.   omeprazole (PRILOSEC) 20 MG capsule Take 20 mg by mouth daily.   potassium chloride (KLOR-CON) 10 MEQ CR tablet Take 10 mEq by mouth 2 (two) times daily.  PROAIR HFA 108 (90 Base) MCG/ACT inhaler Inhale 2 puffs into the lungs every 6 (six) hours as needed for wheezing or shortness of breath.   ondansetron (ZOFRAN) 8 MG tablet TAKE 1 TABLET BY MOUTH UPETO 3 TIMES DAILY FOR NAUSEA. (Patient not taking: Reported on 12/09/2022)   No facility-administered encounter medications on file as of 12/09/2022.    Allergies (verified) Bee venom, Penicillins, Latex, and Sulfa antibiotics   History: Past Medical History:  Diagnosis Date   Fibrocystic breast    GERD (gastroesophageal reflux disease)    Hyperlipidemia    Hypertension     Osteoarthritis    Osteoporosis    PONV (postoperative nausea and vomiting)    Pre-diabetes    Shortness of breath dyspnea    Past Surgical History:  Procedure Laterality Date   ABDOMINAL HYSTERECTOMY     BIOPSY  04/15/2018   Procedure: BIOPSY;  Surgeon: Corbin Ade, MD;  [facility]: [facility];  Service: Endoscopy;;  colon   BREAST CYST EXCISION Right    BREAST SURGERY     removal of cyst from right breast-fibrocystsic   BUNIONECTOMY  12/19/2010   Procedure: BUNIONECTOMY;  Surgeon: Dallas Schimke;  Location: [facility];  Service: Orthopedics;  Laterality: Right;  [name], [name] Bunionectomy   BUNIONECTOMY  05/22/2011   Procedure: Arbutus Leas;  Surgeon: Dallas Schimke, DPM;  Location: [facility];  Service: Orthopedics;  Laterality: Left;  [name] Bunionectomy Left Foot   CAPSULOTOMY  01/29/2012   Procedure: CAPSULOTOMY;  Surgeon: Dallas Schimke, DPM;  Location: [facility];  Service: Orthopedics;  Laterality: Left;   CATARACT EXTRACTION W/PHACO Right 08/09/2015   Procedure: CATARACT EXTRACTION PHACO AND INTRAOCULAR LENS PLACEMENT RIGHT EYE CDE=7.95;  Surgeon: Gemma Payor, MD;  Location: [facility];  Service: Ophthalmology;  Laterality: Right;   CATARACT EXTRACTION W/PHACO Left 08/27/2015   Procedure: CATARACT EXTRACTION PHACO AND INTRAOCULAR LENS PLACEMENT LEFT EYE; CDE:  6.12;  Surgeon: Gemma Payor, MD;  Location: [facility];  Service: Ophthalmology;  Laterality: Left;   COLONOSCOPY  2012   Dr. Jena Gauss: internal hemorrhoids, hyperplastic polyps. Surveillance 2022   COLONOSCOPY WITH PROPOFOL N/A 04/15/2018   one 7 mm polyp at splenic flexure, one 4 mm polyp in rectum, otherwise normal. S/p segmental biopsy:normal. Normal TI   DIAGNOSTIC LAPAROSCOPY     ESOPHAGOGASTRODUODENOSCOPY (EGD) WITH PROPOFOL N/A 04/15/2018   normal   FOOT GANGLION EXCISION     right foot   METATARSAL OSTEOTOMY  05/22/2011   Procedure: METATARSAL OSTEOTOMY;  Surgeon: Dallas Schimke, DPM;   Location: [facility];  Service: Orthopedics;  Laterality: Left;  [name] Osteotomy Left Foot   POLYPECTOMY  04/15/2018   Procedure: POLYPECTOMY;  Surgeon: Corbin Ade, MD;  [facility];  Service: Endoscopy;;  colon   REVERSE SHOULDER ARTHROPLASTY Left 09/08/2022   Procedure: LEFT REVERSE SHOULDER ARTHROPLASTY;  Surgeon: Oliver Barre, MD;  Location: [facility];  Service: Orthopedics;  Laterality: Left;  RNFA NEEDED   TONSILLECTOMY  age 89   TRIGGER FINGER RELEASE Right 01/28/2018   Procedure: RIGHT TRIGGER THUMB RELEASE;  Surgeon: Vickki Hearing, MD;  Location: [facility];  Service: Orthopedics;  Laterality: Right;   TUBAL LIGATION     Family History  Problem Relation Age of Onset   Anesthesia problems Neg Hx    Hypotension Neg Hx    Malignant hyperthermia Neg Hx    Pseudochol deficiency Neg Hx    Colon cancer Neg Hx    Social History  Socioeconomic History   Marital status: Divorced    Spouse name: Not on file   Number of children: Not on file   Years of education: Not on file   Highest education level: Not on file  Occupational History   Not on file  Tobacco Use   Smoking status: Former    Current packs/day: 0.00    Average packs/day: 1.5 packs/day for 47.4 years (71.0 ttl pk-yrs)    Types: Cigarettes    Start date: 03/18/1975    Quit date: 07/27/2022    Years since quitting: 0.3   Smokeless tobacco: Never  Substance and Sexual Activity   Alcohol use: No   Drug use: No   Sexual activity: Yes    Birth control/protection: Post-menopausal  Other Topics Concern   Not on file  Social History Narrative   Not on file   Social Determinants of Health   Financial Resource Strain: Low Risk  (12/09/2022)   Overall Financial Resource Strain (CARDIA)    Difficulty of Paying Living Expenses: Not hard at all  Food Insecurity: No Food Insecurity (12/09/2022)   Hunger Vital Sign    Worried About Running Out of Food in the Last Year: Never true    Ran Out of Food in the Last  Year: Never true  Transportation Needs: No Transportation Needs (12/09/2022)   PRAPARE - Administrator, Civil Service (Medical): No    Lack of Transportation (Non-Medical): No  Physical Activity: Sufficiently Active (12/09/2022)   Exercise Vital Sign    Days of Exercise per Week: 7 days    Minutes of Exercise per Session: 30 min  Stress: No Stress Concern Present (12/09/2022)   [employer] of Occupational Health - Occupational Stress Questionnaire    Feeling of Stress : Not at all  Social Connections: Moderately Integrated (12/09/2022)   Social Connection and Isolation Panel [NHANES]    Frequency of Communication with Friends and Family: More than three times a week    Frequency of Social Gatherings with Friends and Family: More than three times a week    Attends Religious Services: More than 4 times per year    Active Member of [employer] or Organizations: Yes    Attends Engineer, structural: More than 4 times per year    Marital Status: Divorced    Tobacco Counseling Counseling given: Yes   Clinical Intake:  Pre-visit preparation completed: Yes  Pain : 0-10 Pain Score: 8  Pain Type: Chronic pain Pain Location: Shoulder Pain Orientation: Left Pain Descriptors / Indicators: Burning, Throbbing Pain Onset: More than a month ago Pain Frequency: Constant     BMI - recorded: 28.48 Nutritional Status: BMI 25 -29 Overweight Nutritional Risks: None Diabetes: Yes CBG done?: No (telehealth visit. unable to obtain cbg) Did pt. bring in CBG monitor from home?: No  How often do you need to have someone help you when you read instructions, pamphlets, or other written materials from your doctor or pharmacy?: 1 - Never  Interpreter Needed?: No  Information entered by :: Abby Cailah Reach, CMA   Activities of Daily Living    12/09/2022    3:23 PM 09/08/2022   12:00 PM  In your present state of health, do you have any difficulty performing the following  activities:  Hearing? 0 0  Vision? 0 0  Difficulty concentrating or making decisions? 0 0  Walking or climbing stairs? 0 0  Dressing or bathing? 0 0  Doing errands, shopping? 0 0  Preparing  Food and eating ? N   Using the Toilet? N   In the past six months, have you accidently leaked urine? N   Do you have problems with loss of bowel control? N   Managing your Medications? N   Managing your Finances? N   Housekeeping or managing your Housekeeping? N     Patient Care Team: Billie Lade, MD as PCP - General (Internal Medicine) Jena Gauss Gerrit Friends, MD as Consulting Physician (Gastroenterology)  Indicate any recent Medical Services you may have received from other than [facility] providers in the past year (date may be approximate).     Assessment:   This is a routine wellness examination for Leah Lynch.  Hearing/Vision screen Hearing Screening - Comments:: Patient denies any hearing difficulties.   Vision Screening - Comments:: Up to date on yearly eye exams with Daisy Lazar    Goals Addressed             This Visit's Progress    Patient Stated       Lose weight        Depression Screen    12/09/2022    3:19 PM 11/18/2022    1:49 PM 09/30/2022    3:48 PM 08/27/2022    2:22 PM  PHQ 2/9 Scores  PHQ - 2 Score 0 0 0 0  PHQ- 9 Score 0 0 0 4    Fall Risk    12/09/2022    3:23 PM 11/18/2022    1:49 PM 09/30/2022    3:48 PM 08/27/2022    2:22 PM  Fall Risk   Falls in the past year? 0 1 1 1   Number falls in past yr: 0 0 0 0  Injury with Fall? 0 0 0 0  Risk for fall due to : No Fall Risks No Fall Risks No Fall Risks No Fall Risks  Follow up Falls prevention discussed Falls evaluation completed Falls evaluation completed Falls evaluation completed    MEDICARE RISK AT HOME: Medicare Risk at Home Any stairs in or around the home?: Yes If so, are there any without handrails?: No Home free of loose throw rugs in walkways, pet beds, electrical cords, etc?: Yes Adequate  lighting in your home to reduce risk of falls?: Yes Life alert?: No Use of a cane, walker or w/c?: No Grab bars in the bathroom?: Yes Shower chair or bench in shower?: Yes Elevated toilet seat or a handicapped toilet?: No  TIMED UP AND GO:  Was the test performed? No    Cognitive Function:        12/09/2022    3:14 PM  6CIT Screen  What Year? 0 points  What month? 0 points  What time? 0 points  Count back from 20 0 points  Months in reverse 0 points  Repeat phrase 0 points  Total Score 0 points    Immunizations Immunization History  Administered Date(s) Administered   Pneumococcal Polysaccharide-23 05/19/2011   Respiratory Syncytial Virus Vaccine,Recomb Aduvanted(Arexvy) 04/15/2022   Zoster Recombinant(Shingrix) 06/11/2021, 07/22/2021    TDAP status: Due, Education has been provided regarding the importance of this vaccine. Advised may receive this vaccine at local pharmacy or Health Dept. Aware to provide a copy of the vaccination record if obtained from local pharmacy or Health Dept. Verbalized acceptance and understanding.  Flu Vaccine status: Up to date  Pneumococcal vaccine status: Due, Education has been provided regarding the importance of this vaccine. Advised may receive this vaccine at local pharmacy or Health Dept.  Aware to provide a copy of the vaccination record if obtained from local pharmacy or Health Dept. Verbalized acceptance and understanding.  Covid-19 vaccine status: Information provided on how to obtain vaccines.   Qualifies for Shingles Vaccine? No   Zostavax completed No   Shingrix Completed?: Yes  Screening Tests Health Maintenance  Topic Date Due   Medicare Annual Wellness (AWV)  Never done   OPHTHALMOLOGY EXAM  Never done   Hepatitis C Screening  Never done   DTaP/Tdap/Td (1 - Tdap) Never done   Lung Cancer Screening  Never done   Pneumonia Vaccine 60+ Years old (2 of 2 - PCV) 05/18/2012   INFLUENZA VACCINE  Never done   COVID-19  Vaccine (1 - 2023-24 season) Never done   HEMOGLOBIN A1C  02/26/2023   Diabetic kidney evaluation - Urine ACR  03/29/2023   Diabetic kidney evaluation - eGFR measurement  08/27/2023   FOOT EXAM  09/30/2023   DEXA SCAN  Completed   Zoster Vaccines- Shingrix  Completed   HPV VACCINES  Aged Out   Colonoscopy  Discontinued    Health Maintenance  Health Maintenance Due  Topic Date Due   Medicare Annual Wellness (AWV)  Never done   OPHTHALMOLOGY EXAM  Never done   Hepatitis C Screening  Never done   DTaP/Tdap/Td (1 - Tdap) Never done   Lung Cancer Screening  Never done   Pneumonia Vaccine 54+ Years old (2 of 2 - PCV) 05/18/2012   INFLUENZA VACCINE  Never done   COVID-19 Vaccine (1 - 2023-24 season) Never done    Colorectal cancer screening: No longer required.   Mammogram status: Completed 08/22/2022. Repeat every year  Bone Density status: Completed 08/19/2021. Results reflect: Bone density results: OSTEOPENIA. Repeat every 2 years.  Lung Cancer Screening: (Low Dose CT Chest recommended if Age 45-80 years, 20 pack-year currently smoking OR have quit w/in 15years.) does qualify.   Lung Cancer Screening Referral: Screening completed on 12/03/2022  Additional Screening:  Hepatitis C Screening: does qualify; Order placed today  Vision Screening: Recommended annual ophthalmology exams for early detection of glaucoma and other disorders of the eye. Is the patient up to date with their annual eye exam?  Yes  Who is the provider or what is the name of the office in which the patient attends annual eye exams? [facility] If pt is not established with a provider, would they like to be referred to a provider to establish care? No .   Dental Screening: Recommended annual dental exams for proper oral hygiene  Diabetic Foot Exam: Diabetic Foot Exam: Completed 09/30/2022  Community Resource Referral / Chronic Care Management: CRR required this visit?  No   CCM required this visit?  No      Plan:     I have personally reviewed and noted the following in the patient's chart:   Medical and social history Use of alcohol, tobacco or illicit drugs  Current medications and supplements including opioid prescriptions. Patient is currently taking opioid prescriptions. Information provided to patient regarding non-opioid alternatives. Patient advised to discuss non-opioid treatment plan with their provider. Functional ability and status Nutritional status Physical activity Advanced directives List of other physicians Hospitalizations, surgeries, and ER visits in previous 12 months Vitals Screenings to include cognitive, depression, and falls Referrals and appointments  In addition, I have reviewed and discussed with patient certain preventive protocols, quality metrics, and best practice recommendations. A written personalized care plan for preventive services as well as general preventive  health recommendations were provided to patient.     Jordan Hawks Keary Hanak, CMA   12/09/2022   After Visit Summary: (MyChart) Due to this being a telephonic visit, the after visit summary with patients personalized plan was offered to patient via MyChart   Nurse Notes:  Last Diabetic Eye exam requested from [facility]

## 2022-12-09 NOTE — Patient Instructions (Signed)
Leah Lynch , Thank you for taking time to come for your Medicare Wellness Visit. I appreciate your ongoing commitment to your health goals. Please review the following plan we discussed and let me know if I can assist you in the future.   Referrals/Orders/Follow-Ups/Clinician Recommendations:  A Hepatitis C Screening has been ordered for you today. You do not have to fast to have this lab drawn.   Next Medicare Annual Wellness Visit: March 02, 2024 at 3:10pm virtual visit  You are due for the vaccines checked below. You may have these done at your preferred pharmacy. Please have them fax the office proof of the vaccines so that we can update your chart.   []  Flu (due annually)  Recommended this fall either at PCP office or through your local pharmacy. The flu season starts August 1 of each year.   []  Shingrix (Shingles vaccine): CDC recommends 2 doses of Shingrix separated by 2-6 months for aged 10 years and older:  [x]  Pneumonia Vaccines: Recommended for adults 65 years or older  [x]  TDAP (Tetanus) Vaccine every 10 years:Recommended every 10 years; Please call your insurance company to determine your out of pocket expense. You also receive this vaccine at your local pharmacy or Health Dept.  [x]  Covid-19: Available now at any [facility] pharmacy (see info below)  You may also get your vaccines at any [facility] (locations listed below.) Vaccine hours are Monday - Friday 9:00 - 4:00. No appointments are required. Most insurances are accepted including Medicaid. Anyone can use the community pharmacies, and people are not required to have a [facility] provider.  Community Pharmacy Locations offering vaccines:   Sport and exercise psychologist   [facility] [place] [facility]  10 vaccines are offered at the [employer]: Covid, flu, Tdap, shingles, RSV, pneumonia, meningococcal, hepatitis A,  hepatitis B, and HPV.    This is a list of the screening recommended for you and due dates:  Health Maintenance  Topic Date Due   Eye exam for diabetics  Never done   Hepatitis C Screening  Never done   DTaP/Tdap/Td vaccine (1 - Tdap) Never done   Screening for Lung Cancer  Never done   Pneumonia Vaccine (2 of 2 - PCV) 05/18/2012   COVID-19 Vaccine (1 - 2023-24 season) Never done   Hemoglobin A1C  02/26/2023   Yearly kidney health urinalysis for diabetes  03/29/2023   DEXA scan (bone density measurement)  08/20/2023   Mammogram  08/22/2023   Yearly kidney function blood test for diabetes  08/27/2023   Complete foot exam   09/30/2023   Medicare Annual Wellness Visit  12/09/2023   Flu Shot  Completed   Zoster (Shingles) Vaccine  Completed   HPV Vaccine  Aged Out   Colon Cancer Screening  Discontinued    Advanced directives: (Provided) Advance directive discussed with you today. I have provided a copy for you to complete at home and have notarized. Once this is complete, please bring a copy in to our office so we can scan it into your chart.   Next Medicare Annual Wellness Visit scheduled for next year: Yes  Preventive Care 10 Years and Older, Female Preventive care refers to lifestyle choices and visits with your health care provider that can promote health and wellness. Preventive care visits are also called wellness exams. What can I expect for my preventive care visit? Counseling Your health  care provider may ask you questions about your: Medical history, including: Past medical problems. Family medical history. Pregnancy and menstrual history. History of falls. Current health, including: Memory and ability to understand (cognition). Emotional well-being. Home life and relationship well-being. Sexual activity and sexual health. Lifestyle, including: Alcohol, nicotine or tobacco, and drug use. Access to firearms. Diet, exercise, and sleep habits. Work and work  Astronomer. Sunscreen use. Safety issues such as seatbelt and bike helmet use. Physical exam Your health care provider will check your: Height and weight. These may be used to calculate your BMI (body mass index). BMI is a measurement that tells if you are at a healthy weight. Waist circumference. This measures the distance around your waistline. This measurement also tells if you are at a healthy weight and may help predict your risk of certain diseases, such as type 2 diabetes and high blood pressure. Heart rate and blood pressure. Body temperature. Skin for abnormal spots. What immunizations do I need?  Vaccines are usually given at various ages, according to a schedule. Your health care provider will recommend vaccines for you based on your age, medical history, and lifestyle or other factors, such as travel or where you work. What tests do I need? Screening Your health care provider may recommend screening tests for certain conditions. This may include: Lipid and cholesterol levels. Hepatitis C test. Hepatitis B test. HIV (human immunodeficiency virus) test. STI (sexually transmitted infection) testing, if you are at risk. Lung cancer screening. Colorectal cancer screening. Diabetes screening. This is done by checking your blood sugar (glucose) after you have not eaten for a while (fasting). Mammogram. Talk with your health care provider about how often you should have regular mammograms. BRCA-related cancer screening. This may be done if you have a family history of breast, ovarian, tubal, or peritoneal cancers. Bone density scan. This is done to screen for osteoporosis. Talk with your health care provider about your test results, treatment options, and if necessary, the need for more tests. Follow these instructions at home: Eating and drinking  Eat a diet that includes fresh fruits and vegetables, whole grains, lean protein, and low-fat dairy products. Limit your intake of  foods with high amounts of sugar, saturated fats, and salt. Take vitamin and mineral supplements as recommended by your health care provider. Do not drink alcohol if your health care provider tells you not to drink. If you drink alcohol: Limit how much you have to 0-1 drink a day. Know how much alcohol is in your drink. In the [place] one drink equals one 12 oz bottle of beer (355 mL), one 5 oz glass of wine (148 mL), or one 1 oz glass of hard liquor (44 mL). Lifestyle Brush your teeth every morning and night with fluoride toothpaste. Floss one time each day. Exercise for at least 30 minutes 5 or more days each week. Do not use any products that contain nicotine or tobacco. These products include cigarettes, chewing tobacco, and vaping devices, such as e-cigarettes. If you need help quitting, ask your health care provider. Do not use drugs. If you are sexually active, practice safe sex. Use a condom or other form of protection in order to prevent STIs. Take aspirin only as told by your health care provider. Make sure that you understand how much to take and what form to take. Work with your health care provider to find out whether it is safe and beneficial for you to take aspirin daily. Ask your health care provider if  you need to take a cholesterol-lowering medicine (statin). Find healthy ways to manage stress, such as: Meditation, yoga, or listening to music. Journaling. Talking to a trusted person. Spending time with friends and family. Minimize exposure to UV radiation to reduce your risk of skin cancer. Safety Always wear your seat belt while driving or riding in a vehicle. Do not drive: If you have been drinking alcohol. Do not ride with someone who has been drinking. When you are tired or distracted. While texting. If you have been using any mind-altering substances or drugs. Wear a helmet and other protective equipment during sports activities. If you have firearms in your house,  make sure you follow all gun safety procedures. What's next? Visit your health care provider once a year for an annual wellness visit. Ask your health care provider how often you should have your eyes and teeth checked. Stay up to date on all vaccines. This information is not intended to replace advice given to you by your health care provider. Make sure you discuss any questions you have with your health care provider. Document Revised: 08/29/2020 Document Reviewed: 08/29/2020 Elsevier Patient Education  2024 Else[employer] Inc. Managing Pain Without Opioids Opioids are strong medicines used to treat moderate to severe pain. For some people, especially those who have long-term (chronic) pain, opioids may not be the best choice for pain management due to: Side effects like nausea, constipation, and sleepiness. The risk of addiction (opioid use disorder). The longer you take opioids, the greater your risk of addiction. Pain that lasts for more than 3 months is called chronic pain. Managing chronic pain usually requires more than one approach and is often provided by a team of health care providers working together (multidisciplinary approach). Pain management may be done at a pain management center or pain clinic. How to manage pain without the use of opioids Use non-opioid medicines Non-opioid medicines for pain may include: Over-the-counter or prescription non-steroidal anti-inflammatory drugs (NSAIDs). These may be the first medicines used for pain. They work well for muscle and bone pain, and they reduce swelling. Acetaminophen. This over-the-counter medicine may work well for milder pain but not swelling. Antidepressants. These may be used to treat chronic pain. A certain type of antidepressant (tricyclics) is often used. These medicines are given in lower doses for pain than when used for depression. Anticonvulsants. These are usually used to treat seizures but may also reduce nerve (neuropathic)  pain. Muscle relaxants. These relieve pain caused by sudden muscle tightening (spasms). You may also use a pain medicine that is applied to the skin as a patch, cream, or gel (topical analgesic), such as a numbing medicine. These may cause fewer side effects than medicines taken by mouth. Do certain therapies as directed Some therapies can help with pain management. They include: Physical therapy. You will do exercises to gain strength and flexibility. A physical therapist may teach you exercises to move and stretch parts of your body that are weak, stiff, or painful. You can learn these exercises at physical therapy visits and practice them at home. Physical therapy may also involve: Massage. Heat wraps or applying heat or cold to affected areas. Electrical signals that interrupt pain signals (transcutaneous electrical nerve stimulation, TENS). Weak lasers that reduce pain and swelling (low-level laser therapy). Signals from your body that help you learn to regulate pain (biofeedback). Occupational therapy. This helps you to learn ways to function at home and work with less pain. Recreational therapy. This involves trying new activities or  hobbies, such as a physical activity or drawing. Mental health therapy, including: Cognitive behavioral therapy (CBT). This helps you learn coping skills for dealing with pain. Acceptance and commitment therapy (ACT) to change the way you think and react to pain. Relaxation therapies, including muscle relaxation exercises and mindfulness-based stress reduction. Pain management counseling. This may be individual, family, or group counseling.  Receive medical treatments Medical treatments for pain management include: Nerve block injections. These may include a pain blocker and anti-inflammatory medicines. You may have injections: Near the spine to relieve chronic back or neck pain. Into joints to relieve back or joint pain. Into nerve areas that supply a  painful area to relieve body pain. Into muscles (trigger point injections) to relieve some painful muscle conditions. A medical device placed near your spine to help block pain signals and relieve nerve pain or chronic back pain (spinal cord stimulation device). Acupuncture. Follow these instructions at home Medicines Take over-the-counter and prescription medicines only as told by your health care provider. If you are taking pain medicine, ask your health care providers about possible side effects to watch out for. Do not drive or use heavy machinery while taking prescription opioid pain medicine. Lifestyle  Do not use drugs or alcohol to reduce pain. If you drink alcohol, limit how much you have to: 0-1 drink a day for women who are not pregnant. 0-2 drinks a day for men. Know how much alcohol is in a drink. In the [place] one drink equals one 12 oz bottle of beer (355 mL), one 5 oz glass of wine (148 mL), or one 1 oz glass of hard liquor (44 mL). Do not use any products that contain nicotine or tobacco. These products include cigarettes, chewing tobacco, and vaping devices, such as e-cigarettes. If you need help quitting, ask your health care provider. Eat a healthy diet and maintain a healthy weight. Poor diet and excess weight may make pain worse. Eat foods that are high in fiber. These include fresh fruits and vegetables, whole grains, and beans. Limit foods that are high in fat and processed sugars, such as fried and sweet foods. Exercise regularly. Exercise lowers stress and may help relieve pain. Ask your health care provider what activities and exercises are safe for you. If your health care provider approves, join an exercise class that combines movement and stress reduction. Examples include yoga and tai chi. Get enough sleep. Lack of sleep may make pain worse. Lower stress as much as possible. Practice stress reduction techniques as told by your therapist. General  instructions Work with all your pain management providers to find the treatments that work best for you. You are an important member of your pain management team. There are many things you can do to reduce pain on your own. Consider joining an online or in-person support group for people who have chronic pain. Keep all follow-up visits. This is important. Where to find more information You can find more information about managing pain without opioids from: American Academy of Pain Medicine: painmed.org Institute for Chronic Pain: instituteforchronicpain.org American Chronic Pain Association: theacpa.org Contact a health care provider if: You have side effects from pain medicine. Your pain gets worse or does not get better with treatments or home therapy. You are struggling with anxiety or depression. Summary Many types of pain can be managed without opioids. Chronic pain may respond better to pain management without opioids. Pain is best managed when you and a team of health care providers work together.  Pain management without opioids may include non-opioid medicines, medical treatments, physical therapy, mental health therapy, and lifestyle changes. Tell your health care providers if your pain gets worse or is not being managed well enough. This information is not intended to replace advice given to you by your health care provider. Make sure you discuss any questions you have with your health care provider. Document Revised: 06/13/2020 Document Reviewed: 06/13/2020 Elsevier Patient Education  2024 [employer]. Understanding Your Risk for Falls Millions of people have serious injuries from falls each year. It is important to understand your risk of falling. Talk with your health care provider about your risk and what you can do to lower it. If you do have a serious fall, make sure to tell your provider. Falling once raises your risk of falling again. How can falls affect me? Serious  injuries from falls are common. These include: Broken bones, such as hip fractures. Head injuries, such as traumatic brain injuries (TBI) or concussions. A fear of falling can cause you to avoid activities and stay at home. This can make your muscles weaker and raise your risk for a fall. What can increase my risk? There are a number of risk factors that increase your risk for falling. The more risk factors you have, the higher your risk of falling. Serious injuries from a fall happen most often to people who are older than 77 years old. Teenagers and young adults ages 80-29 are also at higher risk. Common risk factors include: Weakness in the lower body. Being generally weak or confused due to long-term (chronic) illness. Dizziness or balance problems. Poor vision. Medicines that cause dizziness or drowsiness. These may include: Medicines for your blood pressure, heart, anxiety, insomnia, or swelling (edema). Pain medicines. Muscle relaxants. Other risk factors include: Drinking alcohol. Having had a fall in the past. Having foot pain or wearing improper footwear. Working at a dangerous job. Having any of the following in your home: Tripping hazards, such as floor clutter or loose rugs. Poor lighting. Pets. Having dementia or memory loss. What actions can I take to lower my risk of falling?     Physical activity Stay physically fit. Do strength and balance exercises. Consider taking a regular class to build strength and balance. Yoga and tai chi are good options. Vision Have your eyes checked every year and your prescription for glasses or contacts updated as needed. Shoes and walking aids Wear non-skid shoes. Wear shoes that have rubber soles and low heels. Do not wear high heels. Do not walk around the house in socks or slippers. Use a cane or walker as told by your provider. Home safety Attach secure railings on both sides of your stairs. Install grab bars for your  bathtub, shower, and toilet. Use a non-skid mat in your bathtub or shower. Attach bath mats securely with double-sided, non-slip rug tape. Use good lighting in all rooms. Keep a flashlight near your bed. Make sure there is a clear path from your bed to the bathroom. Use night-lights. Do not use throw rugs. Make sure all carpeting is taped or tacked down securely. Remove all clutter from walkways and stairways, including extension cords. Repair uneven or broken steps and floors. Avoid walking on icy or slippery surfaces. Walk on the grass instead of on icy or slick sidewalks. Use ice melter to get rid of ice on walkways in the winter. Use a cordless phone. Questions to ask your health care provider Can you help me check my risk  for a fall? Do any of my medicines make me more likely to fall? Should I take a vitamin D supplement? What exercises can I do to improve my strength and balance? Should I make an appointment to have my vision checked? Do I need a bone density test to check for weak bones (osteoporosis)? Would it help to use a cane or a walker? Where to find more information Centers for Disease Control and Prevention, [employer]: TonerPromos.no Community-Based Fall Prevention Programs: TonerPromos.no [employer] on Aging: BaseRingTones.pl Contact a health care provider if: You fall at home. You are afraid of falling at home. You feel weak, drowsy, or dizzy. This information is not intended to replace advice given to you by your health care provider. Make sure you discuss any questions you have with your health care provider. Document Revised: 11/04/2021 Document Reviewed: 11/04/2021 Elsevier Patient Education  2024 [employer].

## 2022-12-12 ENCOUNTER — Ambulatory Visit (HOSPITAL_COMMUNITY): Payer: 59 | Admitting: Occupational Therapy

## 2022-12-12 ENCOUNTER — Encounter (HOSPITAL_COMMUNITY): Payer: Self-pay | Admitting: Occupational Therapy

## 2022-12-12 DIAGNOSIS — Z122 Encounter for screening for malignant neoplasm of respiratory organs: Secondary | ICD-10-CM | POA: Diagnosis not present

## 2022-12-12 DIAGNOSIS — M25612 Stiffness of left shoulder, not elsewhere classified: Secondary | ICD-10-CM | POA: Diagnosis not present

## 2022-12-12 DIAGNOSIS — M25512 Pain in left shoulder: Secondary | ICD-10-CM

## 2022-12-12 DIAGNOSIS — F17210 Nicotine dependence, cigarettes, uncomplicated: Secondary | ICD-10-CM | POA: Diagnosis not present

## 2022-12-12 DIAGNOSIS — R29898 Other symptoms and signs involving the musculoskeletal system: Secondary | ICD-10-CM

## 2022-12-12 DIAGNOSIS — Z87891 Personal history of nicotine dependence: Secondary | ICD-10-CM | POA: Diagnosis not present

## 2022-12-12 NOTE — Therapy (Signed)
OUTPATIENT OCCUPATIONAL THERAPY ORTHO TREATMENT NOTE  Patient Name: Leah Lynch MRN: [id number] DOB:[birth year], 77 y.o., female Today's Date: 12/12/2022   END OF SESSION:  OT End of Session - 12/12/22 1419     Visit Number 9    Number of Visits 16    Date for OT Re-Evaluation 01/02/23    Authorization Type [employer]    Authorization Time Period no visit limit    Progress Note Due on Visit 18    OT Start Time 1100    OT Stop Time 1145    OT Time Calculation (min) 45 min    Activity Tolerance Patient tolerated treatment well    Behavior During Therapy WFL for tasks assessed/performed             Past Medical History:  Diagnosis Date   Fibrocystic breast    GERD (gastroesophageal reflux disease)    Hyperlipidemia    Hypertension    Osteoarthritis    Osteoporosis    PONV (postoperative nausea and vomiting)    Pre-diabetes    Shortness of breath dyspnea    Past Surgical History:  Procedure Laterality Date   ABDOMINAL HYSTERECTOMY     BIOPSY  04/15/2018   Procedure: BIOPSY;  Surgeon: Corbin Ade, MD;  [facility]: [facility];  Service: Endoscopy;;  colon   BREAST CYST EXCISION Right    BREAST SURGERY     removal of cyst from right breast-fibrocystsic   BUNIONECTOMY  12/19/2010   Procedure: BUNIONECTOMY;  Surgeon: Dallas Schimke;  Location: [facility];  Service: Orthopedics;  Laterality: Right;  [name], [name] Bunionectomy   BUNIONECTOMY  05/22/2011   Procedure: Arbutus Leas;  Surgeon: Dallas Schimke, DPM;  Location: [facility];  Service: Orthopedics;  Laterality: Left;  [name] Bunionectomy Left Foot   CAPSULOTOMY  01/29/2012   Procedure: CAPSULOTOMY;  Surgeon: Dallas Schimke, DPM;  Location: [facility];  Service: Orthopedics;  Laterality: Left;   CATARACT EXTRACTION W/PHACO Right 08/09/2015   Procedure: CATARACT EXTRACTION PHACO AND INTRAOCULAR LENS PLACEMENT RIGHT EYE CDE=7.95;  Surgeon: Gemma Payor, MD;  Location: [facility];   Service: Ophthalmology;  Laterality: Right;   CATARACT EXTRACTION W/PHACO Left 08/27/2015   Procedure: CATARACT EXTRACTION PHACO AND INTRAOCULAR LENS PLACEMENT LEFT EYE; CDE:  6.12;  Surgeon: Gemma Payor, MD;  Location: [facility];  Service: Ophthalmology;  Laterality: Left;   COLONOSCOPY  2012   Dr. Jena Gauss: internal hemorrhoids, hyperplastic polyps. Surveillance 2022   COLONOSCOPY WITH PROPOFOL N/A 04/15/2018   one 7 mm polyp at splenic flexure, one 4 mm polyp in rectum, otherwise normal. S/p segmental biopsy:normal. Normal TI   DIAGNOSTIC LAPAROSCOPY     ESOPHAGOGASTRODUODENOSCOPY (EGD) WITH PROPOFOL N/A 04/15/2018   normal   FOOT GANGLION EXCISION     right foot   METATARSAL OSTEOTOMY  05/22/2011   Procedure: METATARSAL OSTEOTOMY;  Surgeon: Dallas Schimke, DPM;  Location: [facility];  Service: Orthopedics;  Laterality: Left;  [name] Osteotomy Left Foot   POLYPECTOMY  04/15/2018   Procedure: POLYPECTOMY;  Surgeon: Corbin Ade, MD;  [facility]: [facility];  Service: Endoscopy;;  colon   REVERSE SHOULDER ARTHROPLASTY Left 09/08/2022   Procedure: LEFT REVERSE SHOULDER ARTHROPLASTY;  Surgeon: Oliver Barre, MD;  Location: [facility];  Service: Orthopedics;  Laterality: Left;  RNFA NEEDED   TONSILLECTOMY  age 2   TRIGGER FINGER RELEASE Right 01/28/2018   Procedure: RIGHT TRIGGER THUMB RELEASE;  Surgeon: Vickki Hearing, MD;  Location: [facility];  Service:  Orthopedics;  Laterality: Right;   TUBAL LIGATION     Patient Active Problem List   Diagnosis Date Noted   Need for influenza vaccination 11/18/2022   Controlled type 2 diabetes mellitus without complication, without long-term current use of insulin (HCC) 09/30/2022   Rotator cuff arthropathy of left shoulder 09/08/2022   Essential hypertension 09/03/2022   GERD (gastroesophageal reflux disease) 09/03/2022   Hyperlipidemia 09/03/2022   Osteoporosis 09/03/2022   Chronic musculoskeletal pain 09/03/2022   Rotator cuff arthropathy, left  09/03/2022   Screening for lung cancer 09/03/2022   Early satiety 02/22/2018   Loss of weight 02/22/2018   S/P trigger finger release right thumb 11/141/9 02/08/2018   Diarrhea 11/12/2016   Abdominal pain 11/12/2016   Nausea without vomiting 11/12/2016    PCP: Dr. Christel Mormon REFERRING PROVIDER: Dr. Thane Edu  ONSET DATE: 09/08/22  REFERRING DIAG: s/p left reverse TSA  THERAPY DIAG:  Stiffness of left shoulder, not elsewhere classified  Acute pain of left shoulder  Other symptoms and signs involving the musculoskeletal system  Rationale for Evaluation and Treatment: Rehabilitation  SUBJECTIVE:   SUBJECTIVE STATEMENT: S: I got really stiff and sore   PERTINENT HISTORY: Pt is a 77 y/o female s/p left reverse TSA on 09/08/22. Pt received HH therapy for several weeks, stopped approximately 1 week ago.   PRECAUTIONS: Shoulder See Protocol  WEIGHT BEARING RESTRICTIONS: Yes NWB  PAIN:  Are you having pain? No  FALLS: Has patient fallen in last 6 months? Yes. Number of falls 1  PLOF: Independent  PATIENT GOALS: To be able to use the LUE.   NEXT MD VISIT: 03/03/23  OBJECTIVE:   HAND DOMINANCE: Right  ADLs: Overall ADLs: Pt reports she is having difficulty moving her arm for most tasks. Pt has difficulty with reaching up overhead and behind her back. She is able to dress herself, is unable to reach back behind her for pulling pants or underwear over hips. Pt is not lifting anything of weight, does not lift pots and pans. Pt is sleeping in the bed, on average sleeps ok, has difficulty when she is hurting.   FUNCTIONAL OUTCOME MEASURES: FOTO: 50/100 9/18: 56/100  UPPER EXTREMITY ROM:       Assessed in supine, er/IR adducted  Passive ROM Left eval Left 12/03/22  Shoulder flexion 120 141  Shoulder abduction 142 145  Shoulder internal rotation 90 90  Shoulder external rotation 35 45  (Blank rows = not tested)  Assessed in seated, er/IR adducted  Active ROM  Left eval Left 12/03/22  Shoulder flexion 102 105  Shoulder abduction 76 112  Shoulder internal rotation 90 90  Shoulder external rotation 0 6  (Blank rows = not tested)    UPPER EXTREMITY MMT:     Assessed in sitting, er/IR adducted-via observation on eval  9/18: Assessed in sitting, er/IR adducted  MMT Left eval Left 12/03/22  Shoulder flexion 3-/5 4-/5  Shoulder abduction 3-/5 4-/5  Shoulder internal rotation 3/5 4/5  Shoulder external rotation 3-/5 3-/5  (Blank rows = not tested)  EDEMA: occasional with weather changes  OBSERVATIONS: mod fascial restrictions along upper arm, anterior shoulder, and trapezius regions   TODAY'S TREATMENT:  DATE:   12/12/22 -Manual Therapy: myofascial release and trigger point applied to the bicep, deltoid, trapezius, scapular region and axillary region in order to reduce fascial restrictions and pain in order to improve ROM. -AA/ROM: supine, flexion, abduction, protraction, horizontal abduction, er/IR, x15 -A/ROM: supine, flexion, abduction, protraction, horizontal abduction, er/IR, x10 -Scapular Strengthening: red band, extension, retraction, rows, x10  12/03/22 -Manual Therapy: myofascial release and trigger point applied to the bicep, deltoid, trapezius, scapular region and axillary region in order to reduce fascial restrictions and pain in order to improve ROM. -P/ROM: flexion, abduction, er/IR, horizontal abduction, 5 reps -Shoulder stretches: corner stretch, flexion, IR behind back with horizontal towel, cross chest stretch, 2x10" holds -AA/ROM: protraction, flexion, abduction, er, horizontal abduction, 10 reps  11/27/22 -Manual Therapy: myofascial release and trigger point applied to the bicep, deltoid, trapezius, scapular region and axillary region in order to reduce fascial restrictions and pain in order to  improve ROM. -A/ROM: supine, flexion, abduction, protraction, horizontal abduction, er/IR, x12 -Wall Slides: flexion, abduction, x10 -Scapular ROM: elevation/depression, retraction/protraction,  rows, x10    PATIENT EDUCATION: Education details: Continue HEP Person educated: Patient Education method: Explanation, Demonstration, and Handouts Education comprehension: verbalized understanding and returned demonstration  HOME EXERCISE PROGRAM: Eval: AA/ROM 9/4: A/ROM 9/10: Wall Slides 9/18: shoulder stretches  GOALS: Goals reviewed with patient? Yes   SHORT TERM GOALS: Target date: 12/05/22  Pt will be provided with and educated on HEP to improve mobility in LUE required for use during ADL completion.   Goal status: IN PROGRESS  2.  Pt will increase LUE A/ROM by 30+ degrees to improve ability to use LUE during dressing tasks with minimal compensatory techniques.   Goal status: IN PROGRESS  3.  Pt will increase LUE strength to 4/5 to improve ability to lift and move pots and pans during meal preparation and cleanup tasks.   Goal status: IN PROGRESS  4.  Pt will decrease pain in LUE to 3/10 or less to improve ability to sleep for 2+ consecutive hours without waking due to pain  Goal status: IN PROGRESS  5.  Pt will decrease LUE fascial restrictions to min amounts or less to improve mobility required for functional reaching tasks.      Goal status: IN PROGRESS   ASSESSMENT:  CLINICAL IMPRESSION: This session pt reported increased soreness and stiffness. She required increased time and rest breaks due to pain and fatigue. OT added scapular strengthening this session to initiate working on shoulder strengthening and stability. Verbal and tactile cuing provided for positioning and technique.   PERFORMANCE DEFICITS: in functional skills including in functional skills including ADLs, IADLs, coordination, tone, ROM, strength, pain, fascial restrictions, muscle spasms, and UE  functional use   PLAN:  OT FREQUENCY: 2x/week  OT DURATION: 4 weeks  PLANNED INTERVENTIONS: self care/ADL training, therapeutic exercise, therapeutic activity, neuromuscular re-education, manual therapy, passive range of motion, splinting, electrical stimulation, ultrasound, moist heat, cryotherapy, patient/family education, and DME and/or AE instructions  CONSULTED AND AGREED WITH PLAN OF CARE: Patient  PLAN FOR NEXT SESSION: Follow up on HEP, manual therapy, A/ROM, scapular strengthening, shoulder stretches   Trish Mage, OTR/L [phone] 12/12/2022, 2:20 PM

## 2022-12-16 ENCOUNTER — Encounter (HOSPITAL_COMMUNITY): Payer: Self-pay | Admitting: Occupational Therapy

## 2022-12-16 ENCOUNTER — Ambulatory Visit (HOSPITAL_COMMUNITY): Payer: 59 | Attending: Orthopedic Surgery | Admitting: Occupational Therapy

## 2022-12-16 DIAGNOSIS — M25512 Pain in left shoulder: Secondary | ICD-10-CM | POA: Insufficient documentation

## 2022-12-16 DIAGNOSIS — M25612 Stiffness of left shoulder, not elsewhere classified: Secondary | ICD-10-CM | POA: Insufficient documentation

## 2022-12-16 DIAGNOSIS — R29898 Other symptoms and signs involving the musculoskeletal system: Secondary | ICD-10-CM | POA: Diagnosis not present

## 2022-12-16 NOTE — Therapy (Unsigned)
OUTPATIENT OCCUPATIONAL THERAPY ORTHO TREATMENT NOTE  Patient Name: Leah Lynch MRN: [id number] DOB:1945-10-04, 77 y.o., female Today's Date: 12/17/2022   END OF SESSION:  OT End of Session - 12/16/22 1148     Visit Number 10    Number of Visits 16    Date for OT Re-Evaluation 01/02/23    Authorization Type [employer] Medicare    Authorization Time Period no visit limit    Progress Note Due on Visit 18    OT Start Time 1103    OT Stop Time 1148    OT Time Calculation (min) 45 min    Activity Tolerance Patient tolerated treatment well    Behavior During Therapy WFL for tasks assessed/performed              Past Medical History:  Diagnosis Date   Fibrocystic breast    GERD (gastroesophageal reflux disease)    Hyperlipidemia    Hypertension    Osteoarthritis    Osteoporosis    PONV (postoperative nausea and vomiting)    Pre-diabetes    Shortness of breath dyspnea    Past Surgical History:  Procedure Laterality Date   ABDOMINAL HYSTERECTOMY     BIOPSY  04/15/2018   Procedure: BIOPSY;  Surgeon: Corbin Ade, MD;  [facility]: [facility];  Service: Endoscopy;;  colon   BREAST CYST EXCISION Right    BREAST SURGERY     removal of cyst from right breast-fibrocystsic   BUNIONECTOMY  12/19/2010   Procedure: BUNIONECTOMY;  Surgeon: Dallas Schimke;  Location: [facility];  Service: Orthopedics;  Laterality: Right;  [name], [name] Bunionectomy   BUNIONECTOMY  05/22/2011   Procedure: Arbutus Leas;  Surgeon: Dallas Schimke, DPM;  Location: [facility];  Service: Orthopedics;  Laterality: Left;  [name] Bunionectomy Left Foot   CAPSULOTOMY  01/29/2012   Procedure: CAPSULOTOMY;  Surgeon: Dallas Schimke, DPM;  Location: [facility];  Service: Orthopedics;  Laterality: Left;   CATARACT EXTRACTION W/PHACO Right 08/09/2015   Procedure: CATARACT EXTRACTION PHACO AND INTRAOCULAR LENS PLACEMENT RIGHT EYE CDE=7.95;  Surgeon: Gemma Payor, MD;  Location: [facility];   Service: Ophthalmology;  Laterality: Right;   CATARACT EXTRACTION W/PHACO Left 08/27/2015   Procedure: CATARACT EXTRACTION PHACO AND INTRAOCULAR LENS PLACEMENT LEFT EYE; CDE:  6.12;  Surgeon: Gemma Payor, MD;  Location: [facility];  Service: Ophthalmology;  Laterality: Left;   COLONOSCOPY  2012   Dr. Jena Gauss: internal hemorrhoids, hyperplastic polyps. Surveillance 2022   COLONOSCOPY WITH PROPOFOL N/A 04/15/2018   one 7 mm polyp at splenic flexure, one 4 mm polyp in rectum, otherwise normal. S/p segmental biopsy:normal. Normal TI   DIAGNOSTIC LAPAROSCOPY     ESOPHAGOGASTRODUODENOSCOPY (EGD) WITH PROPOFOL N/A 04/15/2018   normal   FOOT GANGLION EXCISION     right foot   METATARSAL OSTEOTOMY  05/22/2011   Procedure: METATARSAL OSTEOTOMY;  Surgeon: Dallas Schimke, DPM;  Location: [facility];  Service: Orthopedics;  Laterality: Left;  [name] Osteotomy Left Foot   POLYPECTOMY  04/15/2018   Procedure: POLYPECTOMY;  Surgeon: Corbin Ade, MD;  [facility];  Service: Endoscopy;;  colon   REVERSE SHOULDER ARTHROPLASTY Left 09/08/2022   Procedure: LEFT REVERSE SHOULDER ARTHROPLASTY;  Surgeon: Oliver Barre, MD;  Location: [facility];  Service: Orthopedics;  Laterality: Left;  RNFA NEEDED   TONSILLECTOMY  age 41   TRIGGER FINGER RELEASE Right 01/28/2018   Procedure: RIGHT TRIGGER THUMB RELEASE;  Surgeon: Vickki Hearing, MD;  Location: [facility];  Service: Orthopedics;  Laterality: Right;   TUBAL LIGATION     Patient Active Problem List   Diagnosis Date Noted   Need for influenza vaccination 11/18/2022   Controlled type 2 diabetes mellitus without complication, without long-term current use of insulin (HCC) 09/30/2022   Rotator cuff arthropathy of left shoulder 09/08/2022   Essential hypertension 09/03/2022   GERD (gastroesophageal reflux disease) 09/03/2022   Hyperlipidemia 09/03/2022   Osteoporosis 09/03/2022   Chronic musculoskeletal pain 09/03/2022   Rotator cuff arthropathy, left  09/03/2022   Screening for lung cancer 09/03/2022   Early satiety 02/22/2018   Loss of weight 02/22/2018   S/P trigger finger release right thumb 11/141/9 02/08/2018   Diarrhea 11/12/2016   Abdominal pain 11/12/2016   Nausea without vomiting 11/12/2016    PCP: Dr. Christel Mormon REFERRING PROVIDER: Dr. Thane Edu  ONSET DATE: 09/08/22  REFERRING DIAG: s/p left reverse TSA  THERAPY DIAG:  Stiffness of left shoulder, not elsewhere classified  Acute pain of left shoulder  Other symptoms and signs involving the musculoskeletal system  Rationale for Evaluation and Treatment: Rehabilitation  SUBJECTIVE:   SUBJECTIVE STATEMENT: S: I don't know why but I'm stiff today.   PERTINENT HISTORY: Pt is a 77 y/o female s/p left reverse TSA on 09/08/22. Pt received HH therapy for several weeks, stopped approximately 1 week ago.   PRECAUTIONS: Shoulder See Protocol  WEIGHT BEARING RESTRICTIONS: Yes NWB  PAIN:  Are you having pain? No  FALLS: Has patient fallen in last 6 months? Yes. Number of falls 1  PLOF: Independent  PATIENT GOALS: To be able to use the LUE.   NEXT MD VISIT: 03/03/23  OBJECTIVE:   HAND DOMINANCE: Right  ADLs: Overall ADLs: Pt reports she is having difficulty moving her arm for most tasks. Pt has difficulty with reaching up overhead and behind her back. She is able to dress herself, is unable to reach back behind her for pulling pants or underwear over hips. Pt is not lifting anything of weight, does not lift pots and pans. Pt is sleeping in the bed, on average sleeps ok, has difficulty when she is hurting.   FUNCTIONAL OUTCOME MEASURES: FOTO: 50/100 9/18: 56/100  UPPER EXTREMITY ROM:       Assessed in supine, er/IR adducted  Passive ROM Left eval Left 12/03/22  Shoulder flexion 120 141  Shoulder abduction 142 145  Shoulder internal rotation 90 90  Shoulder external rotation 35 45  (Blank rows = not tested)  Assessed in seated, er/IR  adducted  Active ROM Left eval Left 12/03/22 Left 12/16/22  Shoulder flexion 102 105 114  Shoulder abduction 76 112 112  Shoulder internal rotation 90 90 90  Shoulder external rotation 0 6 36  (Blank rows = not tested)    UPPER EXTREMITY MMT:     Assessed in sitting, er/IR adducted-via observation on eval  9/18: Assessed in sitting, er/IR adducted  MMT Left eval Left 12/03/22 Left 12/16/22  Shoulder flexion 3-/5 4-/5 4/5  Shoulder abduction 3-/5 4-/5 4/5  Shoulder internal rotation 3/5 4/5 4+/5  Shoulder external rotation 3-/5 3-/5 4-/5  (Blank rows = not tested)  EDEMA: occasional with weather changes  OBSERVATIONS: mod fascial restrictions along upper arm, anterior shoulder, and trapezius regions   TODAY'S TREATMENT:  DATE:   12/16/22 -Manual Therapy: myofascial release and trigger point applied to the bicep, deltoid, trapezius, scapular region and axillary region in order to reduce fascial restrictions and pain in order to improve ROM. -P/ROM: supine, flexion, abduction, protraction, horizontal abduction, er//IR, x8 -AA/ROM: supine, flexion, abduction, protraction, horizontal abduction, er/IR, x15 -A/ROM: side lying, flexion, abduction, protraction, horizontal abduction, er/IR, x10  12/12/22 -Manual Therapy: myofascial release and trigger point applied to the bicep, deltoid, trapezius, scapular region and axillary region in order to reduce fascial restrictions and pain in order to improve ROM. -AA/ROM: supine, flexion, abduction, protraction, horizontal abduction, er/IR, x15 -A/ROM: supine, flexion, abduction, protraction, horizontal abduction, er/IR, x10 -Scapular Strengthening: red band, extension, retraction, rows, x10  12/03/22 -Manual Therapy: myofascial release and trigger point applied to the bicep, deltoid, trapezius, scapular region and  axillary region in order to reduce fascial restrictions and pain in order to improve ROM. -P/ROM: flexion, abduction, er/IR, horizontal abduction, 5 reps -Shoulder stretches: corner stretch, flexion, IR behind back with horizontal towel, cross chest stretch, 2x10" holds -AA/ROM: protraction, flexion, abduction, er, horizontal abduction, 10 reps   PATIENT EDUCATION: Education details: Continue HEP Person educated: Patient Education method: Programmer, multimedia, Demonstration, and Handouts Education comprehension: verbalized understanding and returned demonstration  HOME EXERCISE PROGRAM: Eval: AA/ROM 9/4: A/ROM 9/10: Wall Slides 9/18: shoulder stretches  GOALS: Goals reviewed with patient? Yes   SHORT TERM GOALS: Target date: 12/05/22  Pt will be provided with and educated on HEP to improve mobility in LUE required for use during ADL completion.   Goal status: IN PROGRESS  2.  Pt will increase LUE A/ROM by 30+ degrees to improve ability to use LUE during dressing tasks with minimal compensatory techniques.   Goal status: IN PROGRESS  3.  Pt will increase LUE strength to 4/5 to improve ability to lift and move pots and pans during meal preparation and cleanup tasks.   Goal status: IN PROGRESS  4.  Pt will decrease pain in LUE to 3/10 or less to improve ability to sleep for 2+ consecutive hours without waking due to pain  Goal status: IN PROGRESS  5.  Pt will decrease LUE fascial restrictions to min amounts or less to improve mobility required for functional reaching tasks.      Goal status: IN PROGRESS   ASSESSMENT:  CLINICAL IMPRESSION: This session pt continuing to complain of stiffness and had max difficulty with AA/ROM and A/ROM. She required increased time and min assist with AA/ROM to achieve 65% of full ROM. Then, she was unable to complete A/ROM in supine this session, therefore OT had pt complete in side lying where she was able to achieve 50% of full ROM. OT providing  verbal and tactile cuing for positioning and technique, as well as min assist for AA/ROM completion.   PERFORMANCE DEFICITS: in functional skills including in functional skills including ADLs, IADLs, coordination, tone, ROM, strength, pain, fascial restrictions, muscle spasms, and UE functional use   PLAN:  OT FREQUENCY: 2x/week  OT DURATION: 4 weeks  PLANNED INTERVENTIONS: self care/ADL training, therapeutic exercise, therapeutic activity, neuromuscular re-education, manual therapy, passive range of motion, splinting, electrical stimulation, ultrasound, moist heat, cryotherapy, patient/family education, and DME and/or AE instructions  CONSULTED AND AGREED WITH PLAN OF CARE: Patient  PLAN FOR NEXT SESSION: Follow up on HEP, manual therapy, A/ROM, scapular strengthening, shoulder stretches   Trish Mage, OTR/L [phone] 12/17/2022, 3:44 PM

## 2022-12-17 DIAGNOSIS — R7303 Prediabetes: Secondary | ICD-10-CM | POA: Diagnosis not present

## 2022-12-17 DIAGNOSIS — Z9181 History of falling: Secondary | ICD-10-CM | POA: Diagnosis not present

## 2022-12-17 DIAGNOSIS — Z79899 Other long term (current) drug therapy: Secondary | ICD-10-CM | POA: Diagnosis not present

## 2022-12-17 DIAGNOSIS — M539 Dorsopathy, unspecified: Secondary | ICD-10-CM | POA: Diagnosis not present

## 2022-12-18 ENCOUNTER — Ambulatory Visit (HOSPITAL_COMMUNITY): Payer: 59 | Admitting: Occupational Therapy

## 2022-12-18 ENCOUNTER — Encounter (HOSPITAL_COMMUNITY): Payer: Self-pay | Admitting: Occupational Therapy

## 2022-12-18 DIAGNOSIS — M25512 Pain in left shoulder: Secondary | ICD-10-CM | POA: Diagnosis not present

## 2022-12-18 DIAGNOSIS — R29898 Other symptoms and signs involving the musculoskeletal system: Secondary | ICD-10-CM

## 2022-12-18 DIAGNOSIS — M25612 Stiffness of left shoulder, not elsewhere classified: Secondary | ICD-10-CM | POA: Diagnosis not present

## 2022-12-18 NOTE — Therapy (Signed)
OUTPATIENT OCCUPATIONAL THERAPY ORTHO TREATMENT NOTE  Patient Name: Leah Lynch MRN: [id number] DOB:24-Apr-1945, 77 y.o., female Today's Date: 12/18/2022   END OF SESSION:  OT End of Session - 12/18/22 1132     Visit Number 11    Number of Visits 16    Date for OT Re-Evaluation 01/02/23    Authorization Type [employer]    Authorization Time Period no visit limit    Progress Note Due on Visit 18    OT Start Time 1052    OT Stop Time 1132    OT Time Calculation (min) 40 min    Activity Tolerance Patient tolerated treatment well    Behavior During Therapy WFL for tasks assessed/performed               Past Medical History:  Diagnosis Date   Fibrocystic breast    GERD (gastroesophageal reflux disease)    Hyperlipidemia    Hypertension    Osteoarthritis    Osteoporosis    PONV (postoperative nausea and vomiting)    Pre-diabetes    Shortness of breath dyspnea    Past Surgical History:  Procedure Laterality Date   ABDOMINAL HYSTERECTOMY     BIOPSY  04/15/2018   Procedure: BIOPSY;  Surgeon: Corbin Ade, MD;  [facility]: [facility];  Service: Endoscopy;;  colon   BREAST CYST EXCISION Right    BREAST SURGERY     removal of cyst from right breast-fibrocystsic   BUNIONECTOMY  12/19/2010   Procedure: BUNIONECTOMY;  Surgeon: Dallas Schimke;  Location: [facility];  Service: Orthopedics;  Laterality: Right;  [name], [name] Bunionectomy   BUNIONECTOMY  05/22/2011   Procedure: Arbutus Leas;  Surgeon: Dallas Schimke, DPM;  Location: [facility];  Service: Orthopedics;  Laterality: Left;  [name] Bunionectomy Left Foot   CAPSULOTOMY  01/29/2012   Procedure: CAPSULOTOMY;  Surgeon: Dallas Schimke, DPM;  Location: [facility];  Service: Orthopedics;  Laterality: Left;   CATARACT EXTRACTION W/PHACO Right 08/09/2015   Procedure: CATARACT EXTRACTION PHACO AND INTRAOCULAR LENS PLACEMENT RIGHT EYE CDE=7.95;  Surgeon: Gemma Payor, MD;  Location: [facility];   Service: Ophthalmology;  Laterality: Right;   CATARACT EXTRACTION W/PHACO Left 08/27/2015   Procedure: CATARACT EXTRACTION PHACO AND INTRAOCULAR LENS PLACEMENT LEFT EYE; CDE:  6.12;  Surgeon: Gemma Payor, MD;  Location: [facility];  Service: Ophthalmology;  Laterality: Left;   COLONOSCOPY  2012   Dr. Jena Gauss: internal hemorrhoids, hyperplastic polyps. Surveillance 2022   COLONOSCOPY WITH PROPOFOL N/A 04/15/2018   one 7 mm polyp at splenic flexure, one 4 mm polyp in rectum, otherwise normal. S/p segmental biopsy:normal. Normal TI   DIAGNOSTIC LAPAROSCOPY     ESOPHAGOGASTRODUODENOSCOPY (EGD) WITH PROPOFOL N/A 04/15/2018   normal   FOOT GANGLION EXCISION     right foot   METATARSAL OSTEOTOMY  05/22/2011   Procedure: METATARSAL OSTEOTOMY;  Surgeon: Dallas Schimke, DPM;  Location: [facility];  Service: Orthopedics;  Laterality: Left;  [name] Osteotomy Left Foot   POLYPECTOMY  04/15/2018   Procedure: POLYPECTOMY;  Surgeon: Corbin Ade, MD;  [facility]: [facility];  Service: Endoscopy;;  colon   REVERSE SHOULDER ARTHROPLASTY Left 09/08/2022   Procedure: LEFT REVERSE SHOULDER ARTHROPLASTY;  Surgeon: Oliver Barre, MD;  Location: [facility];  Service: Orthopedics;  Laterality: Left;  RNFA NEEDED   TONSILLECTOMY  age 51   TRIGGER FINGER RELEASE Right 01/28/2018   Procedure: RIGHT TRIGGER THUMB RELEASE;  Surgeon: Vickki Hearing, MD;  Location: [facility];  Service: Orthopedics;  Laterality: Right;   TUBAL LIGATION     Patient Active Problem List   Diagnosis Date Noted   Need for influenza vaccination 11/18/2022   Controlled type 2 diabetes mellitus without complication, without long-term current use of insulin (HCC) 09/30/2022   Rotator cuff arthropathy of left shoulder 09/08/2022   Essential hypertension 09/03/2022   GERD (gastroesophageal reflux disease) 09/03/2022   Hyperlipidemia 09/03/2022   Osteoporosis 09/03/2022   Chronic musculoskeletal pain 09/03/2022   Rotator cuff arthropathy, left  09/03/2022   Screening for lung cancer 09/03/2022   Early satiety 02/22/2018   Loss of weight 02/22/2018   S/P trigger finger release right thumb 11/141/9 02/08/2018   Diarrhea 11/12/2016   Abdominal pain 11/12/2016   Nausea without vomiting 11/12/2016    PCP: Dr. Christel Mormon REFERRING PROVIDER: Dr. Thane Edu  ONSET DATE: 09/08/22  REFERRING DIAG: s/p left reverse TSA  THERAPY DIAG:  Stiffness of left shoulder, not elsewhere classified  Acute pain of left shoulder  Other symptoms and signs involving the musculoskeletal system  Rationale for Evaluation and Treatment: Rehabilitation  SUBJECTIVE:   SUBJECTIVE STATEMENT: S: It doesn't hurt if I don't move it.   PERTINENT HISTORY: Pt is a 77 y/o female s/p left reverse TSA on 09/08/22. Pt received HH therapy for several weeks, stopped approximately 1 week ago.   PRECAUTIONS: Shoulder See Protocol  WEIGHT BEARING RESTRICTIONS: Yes NWB  PAIN:  Are you having pain? Yes: NPRS scale: 2/10 Pain location: left anterior shoulder Pain description: pain Aggravating factors: movement Relieving factors: rest  FALLS: Has patient fallen in last 6 months? Yes. Number of falls 1  PLOF: Independent  PATIENT GOALS: To be able to use the LUE.   NEXT MD VISIT: 03/03/23  OBJECTIVE:   HAND DOMINANCE: Right  ADLs: Overall ADLs: Pt reports she is having difficulty moving her arm for most tasks. Pt has difficulty with reaching up overhead and behind her back. She is able to dress herself, is unable to reach back behind her for pulling pants or underwear over hips. Pt is not lifting anything of weight, does not lift pots and pans. Pt is sleeping in the bed, on average sleeps ok, has difficulty when she is hurting.   FUNCTIONAL OUTCOME MEASURES: FOTO: 50/100 9/18: 56/100  UPPER EXTREMITY ROM:       Assessed in supine, er/IR adducted  Passive ROM Left eval Left 12/03/22  Shoulder flexion 120 141  Shoulder abduction 142 145   Shoulder internal rotation 90 90  Shoulder external rotation 35 45  (Blank rows = not tested)  Assessed in seated, er/IR adducted  Active ROM Left eval Left 12/03/22 Left 12/16/22  Shoulder flexion 102 105 114  Shoulder abduction 76 112 112  Shoulder internal rotation 90 90 90  Shoulder external rotation 0 6 36  (Blank rows = not tested)    UPPER EXTREMITY MMT:     Assessed in sitting, er/IR adducted-via observation on eval  9/18: Assessed in sitting, er/IR adducted  MMT Left eval Left 12/03/22 Left 12/16/22  Shoulder flexion 3-/5 4-/5 4/5  Shoulder abduction 3-/5 4-/5 4/5  Shoulder internal rotation 3/5 4/5 4+/5  Shoulder external rotation 3-/5 3-/5 4-/5  (Blank rows = not tested)  EDEMA: occasional with weather changes  OBSERVATIONS: mod fascial restrictions along upper arm, anterior shoulder, and trapezius regions   TODAY'S TREATMENT:  DATE:   12/18/22 -Manual Therapy: myofascial release and trigger point applied to the bicep, deltoid, trapezius, scapular region and axillary region in order to reduce fascial restrictions and pain in order to improve ROM. -P/ROM: supine, flexion, abduction, protraction, horizontal abduction, er//IR, 5 reps -A/ROM: supine-protraction, flexion, horizontal abduction, er, abduction, 10 reps -Proximal shoulder strengthening: supine-paddles, criss cross, circles each direction, 10 reps -A/ROM: standing-protraction, flexion, horizontal abduction, er, abduction, 10 reps -Wall wash: 1' flexion  12/16/22 -Manual Therapy: myofascial release and trigger point applied to the bicep, deltoid, trapezius, scapular region and axillary region in order to reduce fascial restrictions and pain in order to improve ROM. -P/ROM: supine, flexion, abduction, protraction, horizontal abduction, er//IR, x8 -AA/ROM: supine, flexion,  abduction, protraction, horizontal abduction, er/IR, x15 -A/ROM: side lying, flexion, abduction, protraction, horizontal abduction, er/IR, x10  12/12/22 -Manual Therapy: myofascial release and trigger point applied to the bicep, deltoid, trapezius, scapular region and axillary region in order to reduce fascial restrictions and pain in order to improve ROM. -AA/ROM: supine, flexion, abduction, protraction, horizontal abduction, er/IR, x15 -A/ROM: supine, flexion, abduction, protraction, horizontal abduction, er/IR, x10 -Scapular Strengthening: red band, extension, retraction, rows, x10   PATIENT EDUCATION: Education details: reviewed HEP Person educated: Patient Education method: Programmer, multimedia, Demonstration, and Handouts Education comprehension: verbalized understanding and returned demonstration  HOME EXERCISE PROGRAM: Eval: AA/ROM 9/4: A/ROM 9/10: Wall Slides 9/18: shoulder stretches  GOALS: Goals reviewed with patient? Yes   SHORT TERM GOALS: Target date: 12/05/22  Pt will be provided with and educated on HEP to improve mobility in LUE required for use during ADL completion.   Goal status: IN PROGRESS  2.  Pt will increase LUE A/ROM by 30+ degrees to improve ability to use LUE during dressing tasks with minimal compensatory techniques.   Goal status: IN PROGRESS  3.  Pt will increase LUE strength to 4/5 to improve ability to lift and move pots and pans during meal preparation and cleanup tasks.   Goal status: IN PROGRESS  4.  Pt will decrease pain in LUE to 3/10 or less to improve ability to sleep for 2+ consecutive hours without waking due to pain  Goal status: IN PROGRESS  5.  Pt will decrease LUE fascial restrictions to min amounts or less to improve mobility required for functional reaching tasks.      Goal status: IN PROGRESS   ASSESSMENT:  CLINICAL IMPRESSION: Pt reports some pain and stiffness, but took her pain medication this morning so is feeling ok.  Continued with manual techniques, passive stretching. Pt completing A/ROM in supine and standing. In supine achieving ROM to approximately 70%, in standing to approximately 50%. Added proximal shoulder strengthening in supine, completed wall wash. Rest breaks provided as needed, verbal cuing for form and technique. Discussed completing A/ROM and shoulder stretches for HEP.   PERFORMANCE DEFICITS: in functional skills including in functional skills including ADLs, IADLs, coordination, tone, ROM, strength, pain, fascial restrictions, muscle spasms, and UE functional use   PLAN:  OT FREQUENCY: 2x/week  OT DURATION: 4 weeks  PLANNED INTERVENTIONS: self care/ADL training, therapeutic exercise, therapeutic activity, neuromuscular re-education, manual therapy, passive range of motion, splinting, electrical stimulation, ultrasound, moist heat, cryotherapy, patient/family education, and DME and/or AE instructions  CONSULTED AND AGREED WITH PLAN OF CARE: Patient  PLAN FOR NEXT SESSION: Follow up on HEP, manual therapy, A/ROM, scapular strengthening, shoulder stretches   Ezra Sites, OTR/L  [phone] 12/18/2022, 11:33 AM

## 2022-12-19 DIAGNOSIS — Z79899 Other long term (current) drug therapy: Secondary | ICD-10-CM | POA: Diagnosis not present

## 2022-12-23 ENCOUNTER — Encounter (HOSPITAL_COMMUNITY): Payer: Self-pay | Admitting: Occupational Therapy

## 2022-12-23 ENCOUNTER — Ambulatory Visit (HOSPITAL_COMMUNITY): Payer: 59 | Admitting: Occupational Therapy

## 2022-12-23 DIAGNOSIS — R29898 Other symptoms and signs involving the musculoskeletal system: Secondary | ICD-10-CM

## 2022-12-23 DIAGNOSIS — M25612 Stiffness of left shoulder, not elsewhere classified: Secondary | ICD-10-CM | POA: Diagnosis not present

## 2022-12-23 DIAGNOSIS — M25512 Pain in left shoulder: Secondary | ICD-10-CM | POA: Diagnosis not present

## 2022-12-23 NOTE — Therapy (Signed)
OUTPATIENT OCCUPATIONAL THERAPY ORTHO TREATMENT NOTE  Patient Name: LEGACI TARMAN MRN: [id number] DOB:04/12/1945, 77 y.o., female Today's Date: 12/23/2022   END OF SESSION:  OT End of Session - 12/23/22 1340     Visit Number 12    Number of Visits 16    Date for OT Re-Evaluation 01/02/23    Authorization Type [employer] Medicare    Authorization Time Period no visit limit    Progress Note Due on Visit 18    OT Start Time 1300    OT Stop Time 1340    OT Time Calculation (min) 40 min    Activity Tolerance Patient tolerated treatment well    Behavior During Therapy WFL for tasks assessed/performed                Past Medical History:  Diagnosis Date   Fibrocystic breast    GERD (gastroesophageal reflux disease)    Hyperlipidemia    Hypertension    Osteoarthritis    Osteoporosis    PONV (postoperative nausea and vomiting)    Pre-diabetes    Shortness of breath dyspnea    Past Surgical History:  Procedure Laterality Date   ABDOMINAL HYSTERECTOMY     BIOPSY  04/15/2018   Procedure: BIOPSY;  Surgeon: Corbin Ade, MD;  [facility]: [facility];  Service: Endoscopy;;  colon   BREAST CYST EXCISION Right    BREAST SURGERY     removal of cyst from right breast-fibrocystsic   BUNIONECTOMY  12/19/2010   Procedure: BUNIONECTOMY;  Surgeon: Dallas Schimke;  Location: [facility];  Service: Orthopedics;  Laterality: Right;  [name], [name] Bunionectomy   BUNIONECTOMY  05/22/2011   Procedure: Arbutus Leas;  Surgeon: Dallas Schimke, DPM;  Location: [facility];  Service: Orthopedics;  Laterality: Left;  [name] Bunionectomy Left Foot   CAPSULOTOMY  01/29/2012   Procedure: CAPSULOTOMY;  Surgeon: Dallas Schimke, DPM;  Location: [facility];  Service: Orthopedics;  Laterality: Left;   CATARACT EXTRACTION W/PHACO Right 08/09/2015   Procedure: CATARACT EXTRACTION PHACO AND INTRAOCULAR LENS PLACEMENT RIGHT EYE CDE=7.95;  Surgeon: Gemma Payor, MD;  Location: [facility];   Service: Ophthalmology;  Laterality: Right;   CATARACT EXTRACTION W/PHACO Left 08/27/2015   Procedure: CATARACT EXTRACTION PHACO AND INTRAOCULAR LENS PLACEMENT LEFT EYE; CDE:  6.12;  Surgeon: Gemma Payor, MD;  Location: [facility];  Service: Ophthalmology;  Laterality: Left;   COLONOSCOPY  2012   Dr. Jena Gauss: internal hemorrhoids, hyperplastic polyps. Surveillance 2022   COLONOSCOPY WITH PROPOFOL N/A 04/15/2018   one 7 mm polyp at splenic flexure, one 4 mm polyp in rectum, otherwise normal. S/p segmental biopsy:normal. Normal TI   DIAGNOSTIC LAPAROSCOPY     ESOPHAGOGASTRODUODENOSCOPY (EGD) WITH PROPOFOL N/A 04/15/2018   normal   FOOT GANGLION EXCISION     right foot   METATARSAL OSTEOTOMY  05/22/2011   Procedure: METATARSAL OSTEOTOMY;  Surgeon: Dallas Schimke, DPM;  Location: [facility];  Service: Orthopedics;  Laterality: Left;  [name] Osteotomy Left Foot   POLYPECTOMY  04/15/2018   Procedure: POLYPECTOMY;  Surgeon: Corbin Ade, MD;  [facility];  Service: Endoscopy;;  colon   REVERSE SHOULDER ARTHROPLASTY Left 09/08/2022   Procedure: LEFT REVERSE SHOULDER ARTHROPLASTY;  Surgeon: Oliver Barre, MD;  Location: [facility];  Service: Orthopedics;  Laterality: Left;  RNFA NEEDED   TONSILLECTOMY  age 32   TRIGGER FINGER RELEASE Right 01/28/2018   Procedure: RIGHT TRIGGER THUMB RELEASE;  Surgeon: Vickki Hearing, MD;  Location: [facility];  Service: Orthopedics;  Laterality: Right;   TUBAL LIGATION     Patient Active Problem List   Diagnosis Date Noted   Need for influenza vaccination 11/18/2022   Controlled type 2 diabetes mellitus without complication, without long-term current use of insulin (HCC) 09/30/2022   Rotator cuff arthropathy of left shoulder 09/08/2022   Essential hypertension 09/03/2022   GERD (gastroesophageal reflux disease) 09/03/2022   Hyperlipidemia 09/03/2022   Osteoporosis 09/03/2022   Chronic musculoskeletal pain 09/03/2022   Rotator cuff arthropathy, left  09/03/2022   Screening for lung cancer 09/03/2022   Early satiety 02/22/2018   Loss of weight 02/22/2018   S/P trigger finger release right thumb 11/141/9 02/08/2018   Diarrhea 11/12/2016   Abdominal pain 11/12/2016   Nausea without vomiting 11/12/2016    PCP: Dr. Christel Mormon REFERRING PROVIDER: Dr. Thane Edu  ONSET DATE: 09/08/22  REFERRING DIAG: s/p left reverse TSA  THERAPY DIAG:  Stiffness of left shoulder, not elsewhere classified  Acute pain of left shoulder  Other symptoms and signs involving the musculoskeletal system  Rationale for Evaluation and Treatment: Rehabilitation  SUBJECTIVE:   SUBJECTIVE STATEMENT: S: I've been doing all my exercises without a cane.   PERTINENT HISTORY: Pt is a 77 y/o female s/p left reverse TSA on 09/08/22. Pt received HH therapy for several weeks, stopped approximately 1 week ago.   PRECAUTIONS: Shoulder See Protocol  WEIGHT BEARING RESTRICTIONS: Yes NWB  PAIN:  Are you having pain? Yes: NPRS scale: 4/10 Pain location: left anterior shoulder Pain description: pain Aggravating factors: movement Relieving factors: rest  FALLS: Has patient fallen in last 6 months? Yes. Number of falls 1  PLOF: Independent  PATIENT GOALS: To be able to use the LUE.   NEXT MD VISIT: 03/03/23  OBJECTIVE:   HAND DOMINANCE: Right  ADLs: Overall ADLs: Pt reports she is having difficulty moving her arm for most tasks. Pt has difficulty with reaching up overhead and behind her back. She is able to dress herself, is unable to reach back behind her for pulling pants or underwear over hips. Pt is not lifting anything of weight, does not lift pots and pans. Pt is sleeping in the bed, on average sleeps ok, has difficulty when she is hurting.   FUNCTIONAL OUTCOME MEASURES: FOTO: 50/100 9/18: 56/100  UPPER EXTREMITY ROM:       Assessed in supine, er/IR adducted  Passive ROM Left eval Left 12/03/22  Shoulder flexion 120 141  Shoulder abduction  142 145  Shoulder internal rotation 90 90  Shoulder external rotation 35 45  (Blank rows = not tested)  Assessed in seated, er/IR adducted  Active ROM Left eval Left 12/03/22 Left 12/16/22  Shoulder flexion 102 105 114  Shoulder abduction 76 112 112  Shoulder internal rotation 90 90 90  Shoulder external rotation 0 6 36  (Blank rows = not tested)    UPPER EXTREMITY MMT:     Assessed in sitting, er/IR adducted-via observation on eval  9/18: Assessed in sitting, er/IR adducted  MMT Left eval Left 12/03/22 Left 12/16/22  Shoulder flexion 3-/5 4-/5 4/5  Shoulder abduction 3-/5 4-/5 4/5  Shoulder internal rotation 3/5 4/5 4+/5  Shoulder external rotation 3-/5 3-/5 4-/5  (Blank rows = not tested)  EDEMA: occasional with weather changes  OBSERVATIONS: mod fascial restrictions along upper arm, anterior shoulder, and trapezius regions   TODAY'S TREATMENT:  DATE:  12/23/22 -Manual Therapy: myofascial release and trigger point applied to the bicep, deltoid, trapezius, scapular region and axillary region in order to reduce fascial restrictions and pain in order to improve ROM. -P/ROM: supine, flexion, abduction, protraction, horizontal abduction, er//IR, 5 reps -A/ROM: supine-protraction, flexion, horizontal abduction, er, abduction, 10 reps -Proximal shoulder strengthening: supine-paddles, criss cross, circles each direction, 10 reps -A/ROM: standing-protraction, flexion, horizontal abduction, er, abduction, 12 reps -Scapular strengthening: red theraband-row, extension, retraction, 10 reps  12/18/22 -Manual Therapy: myofascial release and trigger point applied to the bicep, deltoid, trapezius, scapular region and axillary region in order to reduce fascial restrictions and pain in order to improve ROM. -P/ROM: supine, flexion, abduction, protraction, horizontal  abduction, er//IR, 5 reps -A/ROM: supine-protraction, flexion, horizontal abduction, er, abduction, 10 reps -Proximal shoulder strengthening: supine-paddles, criss cross, circles each direction, 10 reps -A/ROM: standing-protraction, flexion, horizontal abduction, er, abduction, 10 reps -Wall wash: 1' flexion  12/16/22 -Manual Therapy: myofascial release and trigger point applied to the bicep, deltoid, trapezius, scapular region and axillary region in order to reduce fascial restrictions and pain in order to improve ROM. -P/ROM: supine, flexion, abduction, protraction, horizontal abduction, er//IR, x8 -AA/ROM: supine, flexion, abduction, protraction, horizontal abduction, er/IR, x15 -A/ROM: side lying, flexion, abduction, protraction, horizontal abduction, er/IR, x10    PATIENT EDUCATION: Education details: reviewed HEP Person educated: Patient Education method: Explanation, Demonstration, and Handouts Education comprehension: verbalized understanding and returned demonstration  HOME EXERCISE PROGRAM: Eval: AA/ROM 9/4: A/ROM 9/10: Wall Slides 9/18: shoulder stretches  GOALS: Goals reviewed with patient? Yes   SHORT TERM GOALS: Target date: 12/05/22  Pt will be provided with and educated on HEP to improve mobility in LUE required for use during ADL completion.   Goal status: IN PROGRESS  2.  Pt will increase LUE A/ROM by 30+ degrees to improve ability to use LUE during dressing tasks with minimal compensatory techniques.   Goal status: IN PROGRESS  3.  Pt will increase LUE strength to 4/5 to improve ability to lift and move pots and pans during meal preparation and cleanup tasks.   Goal status: IN PROGRESS  4.  Pt will decrease pain in LUE to 3/10 or less to improve ability to sleep for 2+ consecutive hours without waking due to pain  Goal status: IN PROGRESS  5.  Pt will decrease LUE fascial restrictions to min amounts or less to improve mobility required for functional  reaching tasks.      Goal status: IN PROGRESS   ASSESSMENT:  CLINICAL IMPRESSION: Pt reports she is completing all her exercises with no bar now. Has some generalized soreness in the shoulder. Continued with manual techniques and passive stretching, pt tolerating 75%+ ROM. Continued with A/ROM and proximal shoulder strengthening, scapular strengthening. In standing, pt achieving approximately 60% ROM with flexion and abduction. Mod fatigue during session, rest breaks provided as needed. Verbal cuing for form and technique.   PERFORMANCE DEFICITS: in functional skills including in functional skills including ADLs, IADLs, coordination, tone, ROM, strength, pain, fascial restrictions, muscle spasms, and UE functional use   PLAN:  OT FREQUENCY: 2x/week  OT DURATION: 4 weeks  PLANNED INTERVENTIONS: self care/ADL training, therapeutic exercise, therapeutic activity, neuromuscular re-education, manual therapy, passive range of motion, splinting, electrical stimulation, ultrasound, moist heat, cryotherapy, patient/family education, and DME and/or AE instructions  CONSULTED AND AGREED WITH PLAN OF CARE: Patient  PLAN FOR NEXT SESSION: Follow up on HEP, manual therapy, A/ROM, scapular strengthening, shoulder stretches   Ezra Sites, OTR/L  [phone]  12/23/2022, 1:41 PM

## 2022-12-25 ENCOUNTER — Encounter (HOSPITAL_COMMUNITY): Payer: 59 | Admitting: Occupational Therapy

## 2022-12-29 DIAGNOSIS — Z79899 Other long term (current) drug therapy: Secondary | ICD-10-CM | POA: Diagnosis not present

## 2022-12-29 DIAGNOSIS — Z9181 History of falling: Secondary | ICD-10-CM | POA: Diagnosis not present

## 2022-12-29 DIAGNOSIS — M1990 Unspecified osteoarthritis, unspecified site: Secondary | ICD-10-CM | POA: Diagnosis not present

## 2022-12-29 DIAGNOSIS — M539 Dorsopathy, unspecified: Secondary | ICD-10-CM | POA: Diagnosis not present

## 2022-12-30 ENCOUNTER — Encounter (HOSPITAL_COMMUNITY): Payer: Self-pay | Admitting: Occupational Therapy

## 2022-12-30 ENCOUNTER — Ambulatory Visit (HOSPITAL_COMMUNITY): Payer: 59 | Admitting: Occupational Therapy

## 2022-12-30 DIAGNOSIS — M25512 Pain in left shoulder: Secondary | ICD-10-CM

## 2022-12-30 DIAGNOSIS — M25612 Stiffness of left shoulder, not elsewhere classified: Secondary | ICD-10-CM

## 2022-12-30 DIAGNOSIS — R29898 Other symptoms and signs involving the musculoskeletal system: Secondary | ICD-10-CM | POA: Diagnosis not present

## 2022-12-30 NOTE — Therapy (Signed)
OUTPATIENT OCCUPATIONAL THERAPY ORTHO TREATMENT NOTE  Patient Name: Leah Lynch MRN: [id number] DOB:Apr 24, 1945, 77 y.o., female Today's Date: 12/30/2022   END OF SESSION:  OT End of Session - 12/30/22 1223     Visit Number 13    Number of Visits 16    Date for OT Re-Evaluation 01/02/23    Authorization Type [employer] Medicare Dual Complete    Authorization Time Period no visit limit    Progress Note Due on Visit 18    OT Start Time 1148    OT Stop Time 1228    OT Time Calculation (min) 40 min    Activity Tolerance Patient tolerated treatment well    Behavior During Therapy WFL for tasks assessed/performed                 Past Medical History:  Diagnosis Date   Fibrocystic breast    GERD (gastroesophageal reflux disease)    Hyperlipidemia    Hypertension    Osteoarthritis    Osteoporosis    PONV (postoperative nausea and vomiting)    Pre-diabetes    Shortness of breath dyspnea    Past Surgical History:  Procedure Laterality Date   ABDOMINAL HYSTERECTOMY     BIOPSY  04/15/2018   Procedure: BIOPSY;  Surgeon: Corbin Ade, MD;  [facility]: [facility];  Service: Endoscopy;;  colon   BREAST CYST EXCISION Right    BREAST SURGERY     removal of cyst from right breast-fibrocystsic   BUNIONECTOMY  12/19/2010   Procedure: BUNIONECTOMY;  Surgeon: Dallas Schimke;  Location: [facility];  Service: Orthopedics;  Laterality: Right;  [name], [name] Bunionectomy   BUNIONECTOMY  05/22/2011   Procedure: Arbutus Leas;  Surgeon: Dallas Schimke, DPM;  Location: [facility];  Service: Orthopedics;  Laterality: Left;  [name] Bunionectomy Left Foot   CAPSULOTOMY  01/29/2012   Procedure: CAPSULOTOMY;  Surgeon: Dallas Schimke, DPM;  Location: [facility];  Service: Orthopedics;  Laterality: Left;   CATARACT EXTRACTION W/PHACO Right 08/09/2015   Procedure: CATARACT EXTRACTION PHACO AND INTRAOCULAR LENS PLACEMENT RIGHT EYE CDE=7.95;  Surgeon: Gemma Payor, MD;   Location: [facility];  Service: Ophthalmology;  Laterality: Right;   CATARACT EXTRACTION W/PHACO Left 08/27/2015   Procedure: CATARACT EXTRACTION PHACO AND INTRAOCULAR LENS PLACEMENT LEFT EYE; CDE:  6.12;  Surgeon: Gemma Payor, MD;  Location: [facility];  Service: Ophthalmology;  Laterality: Left;   COLONOSCOPY  2012   Dr. Jena Gauss: internal hemorrhoids, hyperplastic polyps. Surveillance 2022   COLONOSCOPY WITH PROPOFOL N/A 04/15/2018   one 7 mm polyp at splenic flexure, one 4 mm polyp in rectum, otherwise normal. S/p segmental biopsy:normal. Normal TI   DIAGNOSTIC LAPAROSCOPY     ESOPHAGOGASTRODUODENOSCOPY (EGD) WITH PROPOFOL N/A 04/15/2018   normal   FOOT GANGLION EXCISION     right foot   METATARSAL OSTEOTOMY  05/22/2011   Procedure: METATARSAL OSTEOTOMY;  Surgeon: Dallas Schimke, DPM;  Location: [facility];  Service: Orthopedics;  Laterality: Left;  [name] Osteotomy Left Foot   POLYPECTOMY  04/15/2018   Procedure: POLYPECTOMY;  Surgeon: Corbin Ade, MD;  [facility]: [facility];  Service: Endoscopy;;  colon   REVERSE SHOULDER ARTHROPLASTY Left 09/08/2022   Procedure: LEFT REVERSE SHOULDER ARTHROPLASTY;  Surgeon: Oliver Barre, MD;  Location: [facility];  Service: Orthopedics;  Laterality: Left;  RNFA NEEDED   TONSILLECTOMY  age 41   TRIGGER FINGER RELEASE Right 01/28/2018   Procedure: RIGHT TRIGGER THUMB RELEASE;  Surgeon: Vickki Hearing, MD;  Location: [facility];  Service: Orthopedics;  Laterality: Right;   TUBAL LIGATION     Patient Active Problem List   Diagnosis Date Noted   Need for influenza vaccination 11/18/2022   Controlled type 2 diabetes mellitus without complication, without long-term current use of insulin (HCC) 09/30/2022   Rotator cuff arthropathy of left shoulder 09/08/2022   Essential hypertension 09/03/2022   GERD (gastroesophageal reflux disease) 09/03/2022   Hyperlipidemia 09/03/2022   Osteoporosis 09/03/2022   Chronic musculoskeletal pain 09/03/2022   Rotator cuff  arthropathy, left 09/03/2022   Screening for lung cancer 09/03/2022   Early satiety 02/22/2018   Loss of weight 02/22/2018   S/P trigger finger release right thumb 11/141/9 02/08/2018   Diarrhea 11/12/2016   Abdominal pain 11/12/2016   Nausea without vomiting 11/12/2016    PCP: Dr. Christel Mormon REFERRING PROVIDER: Dr. Thane Edu  ONSET DATE: 09/08/22  REFERRING DIAG: s/p left reverse TSA  THERAPY DIAG:  Stiffness of left shoulder, not elsewhere classified  Acute pain of left shoulder  Other symptoms and signs involving the musculoskeletal system  Rationale for Evaluation and Treatment: Rehabilitation  SUBJECTIVE:   SUBJECTIVE STATEMENT: S: They're rough, I do the standing up first.   PERTINENT HISTORY: Pt is a 77 y/o female s/p left reverse TSA on 09/08/22. Pt received HH therapy for several weeks, stopped approximately 1 week ago.   PRECAUTIONS: Shoulder See Protocol  WEIGHT BEARING RESTRICTIONS: Yes NWB  PAIN:  Are you having pain? Yes: NPRS scale: 3/10 Pain location: left anterior shoulder Pain description: pain Aggravating factors: movement Relieving factors: rest  FALLS: Has patient fallen in last 6 months? Yes. Number of falls 1  PLOF: Independent  PATIENT GOALS: To be able to use the LUE.   NEXT MD VISIT: 03/03/23  OBJECTIVE:   HAND DOMINANCE: Right  ADLs: Overall ADLs: Pt reports she is having difficulty moving her arm for most tasks. Pt has difficulty with reaching up overhead and behind her back. She is able to dress herself, is unable to reach back behind her for pulling pants or underwear over hips. Pt is not lifting anything of weight, does not lift pots and pans. Pt is sleeping in the bed, on average sleeps ok, has difficulty when she is hurting.   FUNCTIONAL OUTCOME MEASURES: FOTO: 50/100 9/18: 56/100  UPPER EXTREMITY ROM:       Assessed in supine, er/IR adducted  Passive ROM Left eval Left 12/03/22  Shoulder flexion 120 141   Shoulder abduction 142 145  Shoulder internal rotation 90 90  Shoulder external rotation 35 45  (Blank rows = not tested)  Assessed in seated, er/IR adducted  Active ROM Left eval Left 12/03/22 Left 12/16/22  Shoulder flexion 102 105 114  Shoulder abduction 76 112 112  Shoulder internal rotation 90 90 90  Shoulder external rotation 0 6 36  (Blank rows = not tested)    UPPER EXTREMITY MMT:     Assessed in sitting, er/IR adducted-via observation on eval  9/18: Assessed in sitting, er/IR adducted  MMT Left eval Left 12/03/22 Left 12/16/22  Shoulder flexion 3-/5 4-/5 4/5  Shoulder abduction 3-/5 4-/5 4/5  Shoulder internal rotation 3/5 4/5 4+/5  Shoulder external rotation 3-/5 3-/5 4-/5  (Blank rows = not tested)  EDEMA: occasional with weather changes  OBSERVATIONS: mod fascial restrictions along upper arm, anterior shoulder, and trapezius regions   TODAY'S TREATMENT:  DATE:   12/30/22 -Manual Therapy: myofascial release and trigger point applied to the bicep, deltoid, trapezius, scapular region and axillary region in order to reduce fascial restrictions and pain in order to improve ROM. -P/ROM: supine-flexion, abduction, protraction, horizontal abduction, er//IR, 5 reps -A/ROM: supine-protraction, flexion, horizontal abduction, er, abduction, 15 reps -Proximal shoulder strengthening: supine-paddles, criss cross, circles each direction, 10 reps -A/ROM: standing-protraction, flexion, horizontal abduction, er, abduction, 15 reps -Functional reaching: pt placing 10 cones on low shelf in flexion, removing in abduction -Scapular strengthening: red theraband-row, extension, retraction, 10 reps  12/23/22 -Manual Therapy: myofascial release and trigger point applied to the bicep, deltoid, trapezius, scapular region and axillary region in order to reduce  fascial restrictions and pain in order to improve ROM. -P/ROM: supine, flexion, abduction, protraction, horizontal abduction, er//IR, 5 reps -A/ROM: supine-protraction, flexion, horizontal abduction, er, abduction, 10 reps -Proximal shoulder strengthening: supine-paddles, criss cross, circles each direction, 10 reps -A/ROM: standing-protraction, flexion, horizontal abduction, er, abduction, 12 reps -Scapular strengthening: red theraband-row, extension, retraction, 10 reps  12/18/22 -Manual Therapy: myofascial release and trigger point applied to the bicep, deltoid, trapezius, scapular region and axillary region in order to reduce fascial restrictions and pain in order to improve ROM. -P/ROM: supine, flexion, abduction, protraction, horizontal abduction, er//IR, 5 reps -A/ROM: supine-protraction, flexion, horizontal abduction, er, abduction, 10 reps -Proximal shoulder strengthening: supine-paddles, criss cross, circles each direction, 10 reps -A/ROM: standing-protraction, flexion, horizontal abduction, er, abduction, 10 reps -Wall wash: 1' flexion   PATIENT EDUCATION: Education details: reviewed HEP Person educated: Patient Education method: Explanation, Demonstration, and Handouts Education comprehension: verbalized understanding and returned demonstration  HOME EXERCISE PROGRAM: Eval: AA/ROM 9/4: A/ROM 9/10: Wall Slides 9/18: shoulder stretches  GOALS: Goals reviewed with patient? Yes   SHORT TERM GOALS: Target date: 12/05/22  Pt will be provided with and educated on HEP to improve mobility in LUE required for use during ADL completion.   Goal status: IN PROGRESS  2.  Pt will increase LUE A/ROM by 30+ degrees to improve ability to use LUE during dressing tasks with minimal compensatory techniques.   Goal status: IN PROGRESS  3.  Pt will increase LUE strength to 4/5 to improve ability to lift and move pots and pans during meal preparation and cleanup tasks.   Goal status: IN  PROGRESS  4.  Pt will decrease pain in LUE to 3/10 or less to improve ability to sleep for 2+ consecutive hours without waking due to pain  Goal status: IN PROGRESS  5.  Pt will decrease LUE fascial restrictions to min amounts or less to improve mobility required for functional reaching tasks.      Goal status: IN PROGRESS   ASSESSMENT:  CLINICAL IMPRESSION: Pt reports she has had some soreness and with the cold coming in she was not able to complete the doorway stretch yesterday.  Continued with manual techniques and passive stretching. Pt increasing reps to 15 for A/ROM in both supine and standing. Added functional reaching task at overhead cabinets, pt able to place items on bottom shelf with noticeable muscle fatigue. Pt with mod difficulty reaching above 50% ROM with both flexion and abduction planes. Verbal cuing for form and technique during tasks.   PERFORMANCE DEFICITS: in functional skills including in functional skills including ADLs, IADLs, coordination, tone, ROM, strength, pain, fascial restrictions, muscle spasms, and UE functional use   PLAN:  OT FREQUENCY: 2x/week  OT DURATION: 4 weeks  PLANNED INTERVENTIONS: self care/ADL training, therapeutic exercise, therapeutic activity, neuromuscular re-education, manual  therapy, passive range of motion, splinting, electrical stimulation, ultrasound, moist heat, cryotherapy, patient/family education, and DME and/or AE instructions  CONSULTED AND AGREED WITH PLAN OF CARE: Patient  PLAN FOR NEXT SESSION: Follow up on HEP, manual therapy, A/ROM, scapular strengthening, shoulder stretches   Ezra Sites, OTR/L  [phone] 12/30/2022, 12:29 PM

## 2023-01-01 ENCOUNTER — Ambulatory Visit (HOSPITAL_COMMUNITY): Payer: 59 | Admitting: Occupational Therapy

## 2023-01-01 ENCOUNTER — Encounter (HOSPITAL_COMMUNITY): Payer: Self-pay | Admitting: Occupational Therapy

## 2023-01-01 DIAGNOSIS — M25612 Stiffness of left shoulder, not elsewhere classified: Secondary | ICD-10-CM

## 2023-01-01 DIAGNOSIS — R29898 Other symptoms and signs involving the musculoskeletal system: Secondary | ICD-10-CM | POA: Diagnosis not present

## 2023-01-01 DIAGNOSIS — M25512 Pain in left shoulder: Secondary | ICD-10-CM

## 2023-01-01 NOTE — Patient Instructions (Signed)

## 2023-01-01 NOTE — Therapy (Signed)
OUTPATIENT OCCUPATIONAL THERAPY ORTHO TREATMENT NOTE  Patient Name: Leah Lynch MRN: [id number] DOB:[birth year], 77 y.o., female Today's Date: 01/01/2023   END OF SESSION:  OT End of Session - 01/01/23 1142     Visit Number 14    Number of Visits 16    Date for OT Re-Evaluation 01/02/23    Authorization Type [employer] Medicare Dual Complete    Authorization Time Period no visit limit    Progress Note Due on Visit 18    OT Start Time 1103    OT Stop Time 1142    OT Time Calculation (min) 39 min    Activity Tolerance Patient tolerated treatment well    Behavior During Therapy WFL for tasks assessed/performed                  Past Medical History:  Diagnosis Date   Fibrocystic breast    GERD (gastroesophageal reflux disease)    Hyperlipidemia    Hypertension    Osteoarthritis    Osteoporosis    PONV (postoperative nausea and vomiting)    Pre-diabetes    Shortness of breath dyspnea    Past Surgical History:  Procedure Laterality Date   ABDOMINAL HYSTERECTOMY     BIOPSY  04/15/2018   Procedure: BIOPSY;  Surgeon: Corbin Ade, MD;  [facility]: [facility];  Service: Endoscopy;;  colon   BREAST CYST EXCISION Right    BREAST SURGERY     removal of cyst from right breast-fibrocystsic   BUNIONECTOMY  12/19/2010   Procedure: BUNIONECTOMY;  Surgeon: Dallas Schimke;  Location: [facility];  Service: Orthopedics;  Laterality: Right;  [name], [name] Bunionectomy   BUNIONECTOMY  05/22/2011   Procedure: Arbutus Leas;  Surgeon: Dallas Schimke, DPM;  Location: [facility];  Service: Orthopedics;  Laterality: Left;  [name] Bunionectomy Left Foot   CAPSULOTOMY  01/29/2012   Procedure: CAPSULOTOMY;  Surgeon: Dallas Schimke, DPM;  Location: [facility];  Service: Orthopedics;  Laterality: Left;   CATARACT EXTRACTION W/PHACO Right 08/09/2015   Procedure: CATARACT EXTRACTION PHACO AND INTRAOCULAR LENS PLACEMENT RIGHT EYE CDE=7.95;  Surgeon: Gemma Payor, MD;   Location: [facility];  Service: Ophthalmology;  Laterality: Right;   CATARACT EXTRACTION W/PHACO Left 08/27/2015   Procedure: CATARACT EXTRACTION PHACO AND INTRAOCULAR LENS PLACEMENT LEFT EYE; CDE:  6.12;  Surgeon: Gemma Payor, MD;  Location: [facility];  Service: Ophthalmology;  Laterality: Left;   COLONOSCOPY  2012   Dr. Jena Gauss: internal hemorrhoids, hyperplastic polyps. Surveillance 2022   COLONOSCOPY WITH PROPOFOL N/A 04/15/2018   one 7 mm polyp at splenic flexure, one 4 mm polyp in rectum, otherwise normal. S/p segmental biopsy:normal. Normal TI   DIAGNOSTIC LAPAROSCOPY     ESOPHAGOGASTRODUODENOSCOPY (EGD) WITH PROPOFOL N/A 04/15/2018   normal   FOOT GANGLION EXCISION     right foot   METATARSAL OSTEOTOMY  05/22/2011   Procedure: METATARSAL OSTEOTOMY;  Surgeon: Dallas Schimke, DPM;  Location: [facility];  Service: Orthopedics;  Laterality: Left;  [name] Osteotomy Left Foot   POLYPECTOMY  04/15/2018   Procedure: POLYPECTOMY;  Surgeon: Corbin Ade, MD;  [facility];  Service: Endoscopy;;  colon   REVERSE SHOULDER ARTHROPLASTY Left 09/08/2022   Procedure: LEFT REVERSE SHOULDER ARTHROPLASTY;  Surgeon: Oliver Barre, MD;  Location: [facility];  Service: Orthopedics;  Laterality: Left;  RNFA NEEDED   TONSILLECTOMY  age 71   TRIGGER FINGER RELEASE Right 01/28/2018   Procedure: RIGHT TRIGGER THUMB RELEASE;  Surgeon: Vickki Hearing,  MD;  Location: [facility];  Service: Orthopedics;  Laterality: Right;   TUBAL LIGATION     Patient Active Problem List   Diagnosis Date Noted   Need for influenza vaccination 11/18/2022   Controlled type 2 diabetes mellitus without complication, without long-term current use of insulin (HCC) 09/30/2022   Rotator cuff arthropathy of left shoulder 09/08/2022   Essential hypertension 09/03/2022   GERD (gastroesophageal reflux disease) 09/03/2022   Hyperlipidemia 09/03/2022   Osteoporosis 09/03/2022   Chronic musculoskeletal pain 09/03/2022   Rotator cuff  arthropathy, left 09/03/2022   Screening for lung cancer 09/03/2022   Early satiety 02/22/2018   Loss of weight 02/22/2018   S/P trigger finger release right thumb 11/141/9 02/08/2018   Diarrhea 11/12/2016   Abdominal pain 11/12/2016   Nausea without vomiting 11/12/2016    PCP: Dr. Christel Mormon REFERRING PROVIDER: Dr. Thane Edu  ONSET DATE: 09/08/22  REFERRING DIAG: s/p left reverse TSA  THERAPY DIAG:  Stiffness of left shoulder, not elsewhere classified  Acute pain of left shoulder  Other symptoms and signs involving the musculoskeletal system  Rationale for Evaluation and Treatment: Rehabilitation  SUBJECTIVE:   SUBJECTIVE STATEMENT: S: It's there, I have one and a half pills in me and Voltaren gel on.   PERTINENT HISTORY: Pt is a 77 y/o female s/p left reverse TSA on 09/08/22. Pt received HH therapy for several weeks, stopped approximately 1 week ago.   PRECAUTIONS: Shoulder See Protocol  WEIGHT BEARING RESTRICTIONS: Yes NWB  PAIN:  Are you having pain? Yes: NPRS scale: 3/10 Pain location: left anterior shoulder Pain description: pain Aggravating factors: movement Relieving factors: rest  FALLS: Has patient fallen in last 6 months? Yes. Number of falls 1  PLOF: Independent  PATIENT GOALS: To be able to use the LUE.   NEXT MD VISIT: 03/03/23  OBJECTIVE:   HAND DOMINANCE: Right  ADLs: Overall ADLs: Pt reports she is having difficulty moving her arm for most tasks. Pt has difficulty with reaching up overhead and behind her back. She is able to dress herself, is unable to reach back behind her for pulling pants or underwear over hips. Pt is not lifting anything of weight, does not lift pots and pans. Pt is sleeping in the bed, on average sleeps ok, has difficulty when she is hurting.   FUNCTIONAL OUTCOME MEASURES: FOTO: 50/100 9/18: 56/100  UPPER EXTREMITY ROM:       Assessed in supine, er/IR adducted  Passive ROM Left eval Left 12/03/22  Shoulder  flexion 120 141  Shoulder abduction 142 145  Shoulder internal rotation 90 90  Shoulder external rotation 35 45  (Blank rows = not tested)  Assessed in seated, er/IR adducted  Active ROM Left eval Left 12/03/22 Left 12/16/22  Shoulder flexion 102 105 114  Shoulder abduction 76 112 112  Shoulder internal rotation 90 90 90  Shoulder external rotation 0 6 36  (Blank rows = not tested)    UPPER EXTREMITY MMT:     Assessed in sitting, er/IR adducted-via observation on eval  9/18: Assessed in sitting, er/IR adducted  MMT Left eval Left 12/03/22 Left 12/16/22  Shoulder flexion 3-/5 4-/5 4/5  Shoulder abduction 3-/5 4-/5 4/5  Shoulder internal rotation 3/5 4/5 4+/5  Shoulder external rotation 3-/5 3-/5 4-/5  (Blank rows = not tested)  EDEMA: occasional with weather changes  OBSERVATIONS: mod fascial restrictions along upper arm, anterior shoulder, and trapezius regions   TODAY'S TREATMENT:  DATE:   01/01/23 -Manual Therapy: myofascial release and trigger point applied to the bicep, deltoid, trapezius, scapular region and axillary region in order to reduce fascial restrictions and pain in order to improve ROM. -P/ROM: supine-flexion, abduction, protraction, horizontal abduction, er//IR, 5 reps -A/ROM: supine-protraction, flexion, horizontal abduction, er, abduction, 15 reps -Proximal shoulder strengthening: supine-paddles, criss cross, circles each direction, 10 reps -A/ROM: standing-protraction, flexion, horizontal abduction, er, abduction, 10 reps -Scapular strengthening: red theraband-row, extension, retraction, 10 reps  12/30/22 -Manual Therapy: myofascial release and trigger point applied to the bicep, deltoid, trapezius, scapular region and axillary region in order to reduce fascial restrictions and pain in order to improve ROM. -P/ROM:  supine-flexion, abduction, protraction, horizontal abduction, er//IR, 5 reps -A/ROM: supine-protraction, flexion, horizontal abduction, er, abduction, 15 reps -Proximal shoulder strengthening: supine-paddles, criss cross, circles each direction, 10 reps -A/ROM: standing-protraction, flexion, horizontal abduction, er, abduction, 15 reps -Functional reaching: pt placing 10 cones on low shelf in flexion, removing in abduction -Scapular strengthening: red theraband-row, extension, retraction, 10 reps  12/23/22 -Manual Therapy: myofascial release and trigger point applied to the bicep, deltoid, trapezius, scapular region and axillary region in order to reduce fascial restrictions and pain in order to improve ROM. -P/ROM: supine, flexion, abduction, protraction, horizontal abduction, er//IR, 5 reps -A/ROM: supine-protraction, flexion, horizontal abduction, er, abduction, 10 reps -Proximal shoulder strengthening: supine-paddles, criss cross, circles each direction, 10 reps -A/ROM: standing-protraction, flexion, horizontal abduction, er, abduction, 12 reps -Scapular strengthening: red theraband-row, extension, retraction, 10 reps    PATIENT EDUCATION: Education details: scapular strengthening-red band Person educated: Patient Education method: Programmer, multimedia, Demonstration, and Handouts Education comprehension: verbalized understanding and returned demonstration  HOME EXERCISE PROGRAM: Eval: AA/ROM 9/4: A/ROM 9/10: Wall Slides 9/18: shoulder stretches 10/17: scapular strengthening-red band  GOALS: Goals reviewed with patient? Yes   SHORT TERM GOALS: Target date: 12/05/22  Pt will be provided with and educated on HEP to improve mobility in LUE required for use during ADL completion.   Goal status: IN PROGRESS  2.  Pt will increase LUE A/ROM by 30+ degrees to improve ability to use LUE during dressing tasks with minimal compensatory techniques.   Goal status: IN PROGRESS  3.  Pt will  increase LUE strength to 4/5 to improve ability to lift and move pots and pans during meal preparation and cleanup tasks.   Goal status: IN PROGRESS  4.  Pt will decrease pain in LUE to 3/10 or less to improve ability to sleep for 2+ consecutive hours without waking due to pain  Goal status: IN PROGRESS  5.  Pt will decrease LUE fascial restrictions to min amounts or less to improve mobility required for functional reaching tasks.      Goal status: IN PROGRESS   ASSESSMENT:  CLINICAL IMPRESSION: Continued with manual techniques and passive stretching, A/ROM in both supine and standing. Also continued with proximal shoulder strengthening. Pt achieving ROM to approximately 60% for flexion and 50% for abduction today. Pt noted consistent clicking with abduction, feels it in her inferior scapula region, modified position with minimal change. Continued with scapular theraband and added to HEP. Verbal cuing for form and technique.   PERFORMANCE DEFICITS: in functional skills including in functional skills including ADLs, IADLs, coordination, tone, ROM, strength, pain, fascial restrictions, muscle spasms, and UE functional use   PLAN:  OT FREQUENCY: 2x/week  OT DURATION: 4 weeks  PLANNED INTERVENTIONS: self care/ADL training, therapeutic exercise, therapeutic activity, neuromuscular re-education, manual therapy, passive range of motion, splinting, electrical stimulation, ultrasound, moist heat,  cryotherapy, patient/family education, and DME and/or AE instructions  CONSULTED AND AGREED WITH PLAN OF CARE: Patient  PLAN FOR NEXT SESSION: Follow up on HEP, reassessment   Ezra Sites, OTR/L  [phone] 01/01/2023, 11:42 AM

## 2023-01-06 ENCOUNTER — Encounter (HOSPITAL_COMMUNITY): Payer: Self-pay | Admitting: Occupational Therapy

## 2023-01-06 ENCOUNTER — Ambulatory Visit (HOSPITAL_COMMUNITY): Payer: 59 | Admitting: Occupational Therapy

## 2023-01-06 DIAGNOSIS — M25612 Stiffness of left shoulder, not elsewhere classified: Secondary | ICD-10-CM

## 2023-01-06 DIAGNOSIS — M25512 Pain in left shoulder: Secondary | ICD-10-CM | POA: Diagnosis not present

## 2023-01-06 DIAGNOSIS — R29898 Other symptoms and signs involving the musculoskeletal system: Secondary | ICD-10-CM | POA: Diagnosis not present

## 2023-01-06 NOTE — Therapy (Unsigned)
**Note Leah-Identified via Obfuscation** OUTPATIENT OCCUPATIONAL THERAPY ORTHO TREATMENT NOTE  Patient Name: Leah Lynch MRN: [id number] DOB:Mar 03, 1946, 77 y.o., female Today's Date: 01/07/2023   END OF SESSION:  OT End of Session - 01/06/23 1105     Visit Number 15    Number of Visits 23    Date for OT Re-Evaluation 02/13/23    Authorization Type [employer] Medicare Dual Complete    Authorization Time Period no visit limit    Progress Note Due on Visit 25    OT Start Time 1023    OT Stop Time 1105    OT Time Calculation (min) 42 min    Activity Tolerance Patient tolerated treatment well    Behavior During Therapy WFL for tasks assessed/performed             Past Medical History:  Diagnosis Date   Fibrocystic breast    GERD (gastroesophageal reflux disease)    Hyperlipidemia    Hypertension    Osteoarthritis    Osteoporosis    PONV (postoperative nausea and vomiting)    Pre-diabetes    Shortness of breath dyspnea    Past Surgical History:  Procedure Laterality Date   ABDOMINAL HYSTERECTOMY     BIOPSY  04/15/2018   Procedure: BIOPSY;  Surgeon: Corbin Ade, MD;  [facility]: [facility];  Service: Endoscopy;;  colon   BREAST CYST EXCISION Right    BREAST SURGERY     removal of cyst from right breast-fibrocystsic   BUNIONECTOMY  12/19/2010   Procedure: BUNIONECTOMY;  Surgeon: Dallas Schimke;  Location: [facility];  Service: Orthopedics;  Laterality: Right;  [name], [name] Bunionectomy   BUNIONECTOMY  05/22/2011   Procedure: Arbutus Leas;  Surgeon: Dallas Schimke, DPM;  Location: [facility];  Service: Orthopedics;  Laterality: Left;  [name] Bunionectomy Left Foot   CAPSULOTOMY  01/29/2012   Procedure: CAPSULOTOMY;  Surgeon: Dallas Schimke, DPM;  Location: [facility];  Service: Orthopedics;  Laterality: Left;   CATARACT EXTRACTION W/PHACO Right 08/09/2015   Procedure: CATARACT EXTRACTION PHACO AND INTRAOCULAR LENS PLACEMENT RIGHT EYE CDE=7.95;  Surgeon: Gemma Payor, MD;  Location:  [facility];  Service: Ophthalmology;  Laterality: Right;   CATARACT EXTRACTION W/PHACO Left 08/27/2015   Procedure: CATARACT EXTRACTION PHACO AND INTRAOCULAR LENS PLACEMENT LEFT EYE; CDE:  6.12;  Surgeon: Gemma Payor, MD;  Location: [facility];  Service: Ophthalmology;  Laterality: Left;   COLONOSCOPY  2012   Dr. Jena Gauss: internal hemorrhoids, hyperplastic polyps. Surveillance 2022   COLONOSCOPY WITH PROPOFOL N/A 04/15/2018   one 7 mm polyp at splenic flexure, one 4 mm polyp in rectum, otherwise normal. S/p segmental biopsy:normal. Normal TI   DIAGNOSTIC LAPAROSCOPY     ESOPHAGOGASTRODUODENOSCOPY (EGD) WITH PROPOFOL N/A 04/15/2018   normal   FOOT GANGLION EXCISION     right foot   METATARSAL OSTEOTOMY  05/22/2011   Procedure: METATARSAL OSTEOTOMY;  Surgeon: Dallas Schimke, DPM;  Location: [facility];  Service: Orthopedics;  Laterality: Left;  [name] Osteotomy Left Foot   POLYPECTOMY  04/15/2018   Procedure: POLYPECTOMY;  Surgeon: Corbin Ade, MD;  [facility];  Service: Endoscopy;;  colon   REVERSE SHOULDER ARTHROPLASTY Left 09/08/2022   Procedure: LEFT REVERSE SHOULDER ARTHROPLASTY;  Surgeon: Oliver Barre, MD;  Location: [facility];  Service: Orthopedics;  Laterality: Left;  RNFA NEEDED   TONSILLECTOMY  age 66   TRIGGER FINGER RELEASE Right 01/28/2018   Procedure: RIGHT TRIGGER THUMB RELEASE;  Surgeon: Vickki Hearing, MD;  Location: [facility];  Service: Orthopedics;  Laterality: Right;   TUBAL LIGATION     Patient Active Problem List   Diagnosis Date Noted   Need for influenza vaccination 11/18/2022   Controlled type 2 diabetes mellitus without complication, without long-term current use of insulin (HCC) 09/30/2022   Rotator cuff arthropathy of left shoulder 09/08/2022   Essential hypertension 09/03/2022   GERD (gastroesophageal reflux disease) 09/03/2022   Hyperlipidemia 09/03/2022   Osteoporosis 09/03/2022   Chronic musculoskeletal pain 09/03/2022   Rotator cuff  arthropathy, left 09/03/2022   Screening for lung cancer 09/03/2022   Early satiety 02/22/2018   Loss of weight 02/22/2018   S/P trigger finger release right thumb 11/141/9 02/08/2018   Diarrhea 11/12/2016   Abdominal pain 11/12/2016   Nausea without vomiting 11/12/2016    PCP: Dr. Christel Mormon REFERRING PROVIDER: Dr. Thane Edu  ONSET DATE: 09/08/22  REFERRING DIAG: s/p left reverse TSA  THERAPY DIAG:  Stiffness of left shoulder, not elsewhere classified  Acute pain of left shoulder  Other symptoms and signs involving the musculoskeletal system  Rationale for Evaluation and Treatment: Rehabilitation  SUBJECTIVE:   SUBJECTIVE STATEMENT: S: The pain is worse with certain movements.   PERTINENT HISTORY: Pt is a 77 y/o female s/p left reverse TSA on 09/08/22. Pt received HH therapy for several weeks, stopped approximately 1 week ago.   PRECAUTIONS: Shoulder See Protocol  WEIGHT BEARING RESTRICTIONS: Yes NWB  PAIN:  Are you having pain? Yes: NPRS scale: 3/10 Pain location: left anterior shoulder Pain description: pain Aggravating factors: movement Relieving factors: rest  FALLS: Has patient fallen in last 6 months? Yes. Number of falls 1  PLOF: Independent  PATIENT GOALS: To be able to use the LUE.   NEXT MD VISIT: 03/03/23  OBJECTIVE:   HAND DOMINANCE: Right  ADLs: Overall ADLs: Pt reports she is having difficulty moving her arm for most tasks. Pt has difficulty with reaching up overhead and behind her back. She is able to dress herself, is unable to reach back behind her for pulling pants or underwear over hips. Pt is not lifting anything of weight, does not lift pots and pans. Pt is sleeping in the bed, on average sleeps ok, has difficulty when she is hurting.   FUNCTIONAL OUTCOME MEASURES: FOTO: 50/100 9/18: 56/100  UPPER EXTREMITY ROM:       Assessed in supine, er/IR adducted  Passive ROM Left eval Left 12/03/22  Shoulder flexion 120 141   Shoulder abduction 142 145  Shoulder internal rotation 90 90  Shoulder external rotation 35 45  (Blank rows = not tested)  Assessed in seated, er/IR adducted  Active ROM Left eval Left 12/03/22 Left 12/16/22 Left 01/06/23  Shoulder flexion 102 105 114 120  Shoulder abduction 76 112 112 109  Shoulder internal rotation 90 90 90 90  Shoulder external rotation 0 6 36 40  (Blank rows = not tested)    UPPER EXTREMITY MMT:     Assessed in sitting, er/IR adducted-via observation on eval  9/18: Assessed in sitting, er/IR adducted  MMT Left eval Left 12/03/22 Left 12/16/22 Left 01/06/23  Shoulder flexion 3-/5 4-/5 4/5 4/5  Shoulder abduction 3-/5 4-/5 4/5 4/5  Shoulder internal rotation 3/5 4/5 4+/5 5/5  Shoulder external rotation 3-/5 3-/5 4-/5 4/5  (Blank rows = not tested)  EDEMA: occasional with weather changes  OBSERVATIONS: mod fascial restrictions along upper arm, anterior shoulder, and trapezius regions   TODAY'S TREATMENT:  DATE:   01/06/23 -Manual Therapy: myofascial release and trigger point applied to the bicep, deltoid, trapezius, scapular region and axillary region in order to reduce fascial restrictions and pain in order to improve ROM. -P/ROM: supine-flexion, abduction, protraction, horizontal abduction, er//IR, 5 reps -A/ROM: seated-protraction, flexion, horizontal abduction, er, abduction, 15 reps -Wall Slides: flexion, abduction, x10 -Proximal shoulder strengthening: supine-paddles, criss cross, circles each direction, 10 reps  01/01/23 -Manual Therapy: myofascial release and trigger point applied to the bicep, deltoid, trapezius, scapular region and axillary region in order to reduce fascial restrictions and pain in order to improve ROM. -P/ROM: supine-flexion, abduction, protraction, horizontal abduction, er//IR, 5 reps -A/ROM:  supine-protraction, flexion, horizontal abduction, er, abduction, 15 reps -Proximal shoulder strengthening: supine-paddles, criss cross, circles each direction, 10 reps -A/ROM: standing-protraction, flexion, horizontal abduction, er, abduction, 10 reps -Scapular strengthening: red theraband-row, extension, retraction, 10 reps  12/30/22 -Manual Therapy: myofascial release and trigger point applied to the bicep, deltoid, trapezius, scapular region and axillary region in order to reduce fascial restrictions and pain in order to improve ROM. -P/ROM: supine-flexion, abduction, protraction, horizontal abduction, er//IR, 5 reps -A/ROM: supine-protraction, flexion, horizontal abduction, er, abduction, 15 reps -Proximal shoulder strengthening: supine-paddles, criss cross, circles each direction, 10 reps -A/ROM: standing-protraction, flexion, horizontal abduction, er, abduction, 15 reps -Functional reaching: pt placing 10 cones on low shelf in flexion, removing in abduction -Scapular strengthening: red theraband-row, extension, retraction, 10 reps    PATIENT EDUCATION: Education details: Continue HEP Person educated: Patient Education method: Programmer, multimedia, Demonstration, and Handouts Education comprehension: verbalized understanding and returned demonstration  HOME EXERCISE PROGRAM: Eval: AA/ROM 9/4: A/ROM 9/10: Wall Slides 9/18: shoulder stretches 10/17: scapular strengthening-red band  GOALS: Goals reviewed with patient? Yes   SHORT TERM GOALS: Target date: 12/05/22  Pt will be provided with and educated on HEP to improve mobility in LUE required for use during ADL completion.   Goal status: IN PROGRESS  2.  Pt will increase LUE A/ROM by 30+ degrees to improve ability to use LUE during dressing tasks with minimal compensatory techniques.   Goal status: IN PROGRESS  3.  Pt will increase LUE strength to 4/5 to improve ability to lift and move pots and pans during meal preparation and  cleanup tasks.   Goal status: IN PROGRESS  4.  Pt will decrease pain in LUE to 3/10 or less to improve ability to sleep for 2+ consecutive hours without waking due to pain  Goal status: IN PROGRESS  5.  Pt will decrease LUE fascial restrictions to min amounts or less to improve mobility required for functional reaching tasks.      Goal status: IN PROGRESS   ASSESSMENT:  CLINICAL IMPRESSION: Pt completed reassessment this session for recertification. She is making good improvements with overall ROM and strength is incrementally improving as well. Remainder of session continuing to focus on ROM and good movement pattern. OT recommending pt continue with skilled OT for 2 times per week for 4 more weeks, focusing on strength and maintaining good ROM. Verbal and tactile cuing provided this session for positioning and technique.   PERFORMANCE DEFICITS: in functional skills including in functional skills including ADLs, IADLs, coordination, tone, ROM, strength, pain, fascial restrictions, muscle spasms, and UE functional use   PLAN:  OT FREQUENCY: 2x/week  OT DURATION: 4 weeks  PLANNED INTERVENTIONS: self care/ADL training, therapeutic exercise, therapeutic activity, neuromuscular re-education, manual therapy, passive range of motion, splinting, electrical stimulation, ultrasound, moist heat, cryotherapy, patient/family education, and DME and/or AE instructions  CONSULTED  AND AGREED WITH PLAN OF CARE: Patient  PLAN FOR NEXT SESSION: Follow up on HEP, reassessment   Trish Mage, OTR/L [phone] 01/07/2023, 2:16 PM

## 2023-01-09 ENCOUNTER — Encounter (HOSPITAL_COMMUNITY): Payer: Self-pay | Admitting: Occupational Therapy

## 2023-01-09 ENCOUNTER — Ambulatory Visit (HOSPITAL_COMMUNITY): Payer: 59 | Admitting: Occupational Therapy

## 2023-01-09 DIAGNOSIS — M25612 Stiffness of left shoulder, not elsewhere classified: Secondary | ICD-10-CM | POA: Diagnosis not present

## 2023-01-09 DIAGNOSIS — M25512 Pain in left shoulder: Secondary | ICD-10-CM | POA: Diagnosis not present

## 2023-01-09 DIAGNOSIS — R29898 Other symptoms and signs involving the musculoskeletal system: Secondary | ICD-10-CM

## 2023-01-09 NOTE — Therapy (Signed)
OUTPATIENT OCCUPATIONAL THERAPY ORTHO TREATMENT NOTE  Patient Name: Leah Lynch MRN: [id number] DOB:03-02-1946, 77 y.o., female Today's Date: 01/09/2023   END OF SESSION:  OT End of Session - 01/09/23 1421     Visit Number 16    Number of Visits 23    Date for OT Re-Evaluation 02/13/23    Authorization Type [employer] Medicare Dual Complete    Authorization Time Period no visit limit    Progress Note Due on Visit 25    OT Start Time 1345    OT Stop Time 1428    OT Time Calculation (min) 43 min    Activity Tolerance Patient tolerated treatment well    Behavior During Therapy WFL for tasks assessed/performed              Past Medical History:  Diagnosis Date   Fibrocystic breast    GERD (gastroesophageal reflux disease)    Hyperlipidemia    Hypertension    Osteoarthritis    Osteoporosis    PONV (postoperative nausea and vomiting)    Pre-diabetes    Shortness of breath dyspnea    Past Surgical History:  Procedure Laterality Date   ABDOMINAL HYSTERECTOMY     BIOPSY  04/15/2018   Procedure: BIOPSY;  Surgeon: Corbin Ade, MD;  [facility];  Service: Endoscopy;;  colon   BREAST CYST EXCISION Right    BREAST SURGERY     removal of cyst from right breast-fibrocystsic   BUNIONECTOMY  12/19/2010   Procedure: BUNIONECTOMY;  Surgeon: Dallas Schimke;  Location: [facility];  Service: Orthopedics;  Laterality: Right;  [name], [name] Bunionectomy   BUNIONECTOMY  05/22/2011   Procedure: Arbutus Leas;  Surgeon: Dallas Schimke, DPM;  Location: [facility];  Service: Orthopedics;  Laterality: Left;  [name] Bunionectomy Left Foot   CAPSULOTOMY  01/29/2012   Procedure: CAPSULOTOMY;  Surgeon: Dallas Schimke, DPM;  Location: [facility];  Service: Orthopedics;  Laterality: Left;   CATARACT EXTRACTION W/PHACO Right 08/09/2015   Procedure: CATARACT EXTRACTION PHACO AND INTRAOCULAR LENS PLACEMENT RIGHT EYE CDE=7.95;  Surgeon: Gemma Payor, MD;   Location: [facility];  Service: Ophthalmology;  Laterality: Right;   CATARACT EXTRACTION W/PHACO Left 08/27/2015   Procedure: CATARACT EXTRACTION PHACO AND INTRAOCULAR LENS PLACEMENT LEFT EYE; CDE:  6.12;  Surgeon: Gemma Payor, MD;  Location: [facility];  Service: Ophthalmology;  Laterality: Left;   COLONOSCOPY  2012   Dr. Jena Gauss: internal hemorrhoids, hyperplastic polyps. Surveillance 2022   COLONOSCOPY WITH PROPOFOL N/A 04/15/2018   one 7 mm polyp at splenic flexure, one 4 mm polyp in rectum, otherwise normal. S/p segmental biopsy:normal. Normal TI   DIAGNOSTIC LAPAROSCOPY     ESOPHAGOGASTRODUODENOSCOPY (EGD) WITH PROPOFOL N/A 04/15/2018   normal   FOOT GANGLION EXCISION     right foot   METATARSAL OSTEOTOMY  05/22/2011   Procedure: METATARSAL OSTEOTOMY;  Surgeon: Dallas Schimke, DPM;  Location: [facility];  Service: Orthopedics;  Laterality: Left;  [name] Osteotomy Left Foot   POLYPECTOMY  04/15/2018   Procedure: POLYPECTOMY;  Surgeon: Corbin Ade, MD;  [facility];  Service: Endoscopy;;  colon   REVERSE SHOULDER ARTHROPLASTY Left 09/08/2022   Procedure: LEFT REVERSE SHOULDER ARTHROPLASTY;  Surgeon: Oliver Barre, MD;  Location: [facility];  Service: Orthopedics;  Laterality: Left;  RNFA NEEDED   TONSILLECTOMY  age 39   TRIGGER FINGER RELEASE Right 01/28/2018   Procedure: RIGHT TRIGGER THUMB RELEASE;  Surgeon: Vickki Hearing, MD;  Location: [facility];  Service: Orthopedics;  Laterality: Right;   TUBAL LIGATION     Patient Active Problem List   Diagnosis Date Noted   Need for influenza vaccination 11/18/2022   Controlled type 2 diabetes mellitus without complication, without long-term current use of insulin (HCC) 09/30/2022   Rotator cuff arthropathy of left shoulder 09/08/2022   Essential hypertension 09/03/2022   GERD (gastroesophageal reflux disease) 09/03/2022   Hyperlipidemia 09/03/2022   Osteoporosis 09/03/2022   Chronic musculoskeletal pain 09/03/2022   Rotator cuff  arthropathy, left 09/03/2022   Screening for lung cancer 09/03/2022   Early satiety 02/22/2018   Loss of weight 02/22/2018   S/P trigger finger release right thumb 11/141/9 02/08/2018   Diarrhea 11/12/2016   Abdominal pain 11/12/2016   Nausea without vomiting 11/12/2016    PCP: Dr. Christel Mormon REFERRING PROVIDER: Dr. Thane Edu  ONSET DATE: 09/08/22  REFERRING DIAG: s/p left reverse TSA  THERAPY DIAG:  Stiffness of left shoulder, not elsewhere classified  Acute pain of left shoulder  Other symptoms and signs involving the musculoskeletal system  Rationale for Evaluation and Treatment: Rehabilitation  SUBJECTIVE:   SUBJECTIVE STATEMENT: S: I am not really having pain just more so discomfort.   PERTINENT HISTORY: Pt is a 77 y/o female s/p left reverse TSA on 09/08/22. Pt received HH therapy for several weeks, stopped approximately 1 week ago.   PRECAUTIONS: Shoulder See Protocol  WEIGHT BEARING RESTRICTIONS: Yes NWB  PAIN:  Are you having pain? Yes: NPRS scale: 3/10 Pain location: left anterior shoulder Pain description: pain Aggravating factors: movement Relieving factors: rest  FALLS: Has patient fallen in last 6 months? Yes. Number of falls 1  PLOF: Independent  PATIENT GOALS: To be able to use the LUE.   NEXT MD VISIT: 03/03/23  OBJECTIVE:   HAND DOMINANCE: Right  ADLs: Overall ADLs: Pt reports she is having difficulty moving her arm for most tasks. Pt has difficulty with reaching up overhead and behind her back. She is able to dress herself, is unable to reach back behind her for pulling pants or underwear over hips. Pt is not lifting anything of weight, does not lift pots and pans. Pt is sleeping in the bed, on average sleeps ok, has difficulty when she is hurting.   FUNCTIONAL OUTCOME MEASURES: FOTO: 50/100 9/18: 56/100  UPPER EXTREMITY ROM:       Assessed in supine, er/IR adducted  Passive ROM Left eval Left 12/03/22  Shoulder flexion 120  141  Shoulder abduction 142 145  Shoulder internal rotation 90 90  Shoulder external rotation 35 45  (Blank rows = not tested)  Assessed in seated, er/IR adducted  Active ROM Left eval Left 12/03/22 Left 12/16/22 Left 01/06/23  Shoulder flexion 102 105 114 120  Shoulder abduction 76 112 112 109  Shoulder internal rotation 90 90 90 90  Shoulder external rotation 0 6 36 40  (Blank rows = not tested)    UPPER EXTREMITY MMT:     Assessed in sitting, er/IR adducted-via observation on eval  9/18: Assessed in sitting, er/IR adducted  MMT Left eval Left 12/03/22 Left 12/16/22 Left 01/06/23  Shoulder flexion 3-/5 4-/5 4/5 4/5  Shoulder abduction 3-/5 4-/5 4/5 4/5  Shoulder internal rotation 3/5 4/5 4+/5 5/5  Shoulder external rotation 3-/5 3-/5 4-/5 4/5  (Blank rows = not tested)  EDEMA: occasional with weather changes  OBSERVATIONS: mod fascial restrictions along upper arm, anterior shoulder, and trapezius regions   TODAY'S TREATMENT:  DATE:   01/08/23 -Manual Therapy: myofascial release and trigger point applied to the bicep, deltoid, trapezius, scapular region and axillary region in order to reduce fascial restrictions and pain in order to improve ROM.  -P/ROM: supine-flexion, abduction, protraction, horizontal abduction, er//IR, 5 reps -A/ROM: seated-protraction, flexion, horizontal abduction, er, abduction, 15 reps Stretch: external rotation with towel behind back 10x holding 5 seconds.  -Functional reach: placing 8 cones into cabinet on first shelf 2x   -UBE: 3 mins forwards and 3 mins backwards level 1 4 pace   01/06/23 -Manual Therapy: myofascial release and trigger point applied to the bicep, deltoid, trapezius, scapular region and axillary region in order to reduce fascial restrictions and pain in order to improve ROM. -P/ROM: supine-flexion,  abduction, protraction, horizontal abduction, er//IR, 5 reps -A/ROM: seated-protraction, flexion, horizontal abduction, er, abduction, 15 reps -Wall Slides: flexion, abduction, x10 -Proximal shoulder strengthening: supine-paddles, criss cross, circles each direction, 10 reps  01/01/23 -Manual Therapy: myofascial release and trigger point applied to the bicep, deltoid, trapezius, scapular region and axillary region in order to reduce fascial restrictions and pain in order to improve ROM. -P/ROM: supine-flexion, abduction, protraction, horizontal abduction, er//IR, 5 reps -A/ROM: supine-protraction, flexion, horizontal abduction, er, abduction, 15 reps -Proximal shoulder strengthening: supine-paddles, criss cross, circles each direction, 10 reps -A/ROM: standing-protraction, flexion, horizontal abduction, er, abduction, 10 reps -Scapular strengthening: red theraband-row, extension, retraction, 10 reps    PATIENT EDUCATION: Education details: Continue HEP Person educated: Patient Education method: Explanation, Demonstration, and Handouts Education comprehension: verbalized understanding and returned demonstration  HOME EXERCISE PROGRAM: Eval: AA/ROM 9/4: A/ROM 9/10: Wall Slides 9/18: shoulder stretches 10/17: scapular strengthening-red band  GOALS: Goals reviewed with patient? Yes   SHORT TERM GOALS: Target date: 12/05/22  Pt will be provided with and educated on HEP to improve mobility in LUE required for use during ADL completion.   Goal status: IN PROGRESS  2.  Pt will increase LUE A/ROM by 30+ degrees to improve ability to use LUE during dressing tasks with minimal compensatory techniques.   Goal status: IN PROGRESS  3.  Pt will increase LUE strength to 4/5 to improve ability to lift and move pots and pans during meal preparation and cleanup tasks.   Goal status: IN PROGRESS  4.  Pt will decrease pain in LUE to 3/10 or less to improve ability to sleep for 2+ consecutive  hours without waking due to pain  Goal status: IN PROGRESS  5.  Pt will decrease LUE fascial restrictions to min amounts or less to improve mobility required for functional reaching tasks.      Goal status: IN PROGRESS   ASSESSMENT:  CLINICAL IMPRESSION: Pt reports that she has not been experiencing any pain but more so discomfort when moving. She is able to achieve 85% ROM when completing P/ROM and supine A/ROM. Pt demonstrating difficulty with A/ROM when sitting, she is able to complete 50% flexion and abduction. She stated that she has continued to complete HEP with A/ROM and AA/ROM. Pt stated that external rotation is also difficult, demonstrated towel stretch for external rotation. VC throughout session to relax shoulder in order to avoid elevating trapezius.   PERFORMANCE DEFICITS: in functional skills including in functional skills including ADLs, IADLs, coordination, tone, ROM, strength, pain, fascial restrictions, muscle spasms, and UE functional use   PLAN:  OT FREQUENCY: 2x/week  OT DURATION: 4 weeks  PLANNED INTERVENTIONS: self care/ADL training, therapeutic exercise, therapeutic activity, neuromuscular re-education, manual therapy, passive range of motion, splinting, electrical stimulation,  ultrasound, moist heat, cryotherapy, patient/family education, and DME and/or AE instructions  CONSULTED AND AGREED WITH PLAN OF CARE: Patient  PLAN FOR NEXT SESSION: Follow up on HEP, reassessment   Lurena Joiner, OTR/L [phone] 01/09/2023, 2:23 PM

## 2023-01-14 ENCOUNTER — Encounter: Payer: Self-pay | Admitting: Orthopaedic Surgery

## 2023-01-14 ENCOUNTER — Ambulatory Visit (INDEPENDENT_AMBULATORY_CARE_PROVIDER_SITE_OTHER): Payer: 59 | Admitting: Orthopaedic Surgery

## 2023-01-14 VITALS — BP 127/71 | HR 79 | Ht 59.0 in | Wt 139.0 lb

## 2023-01-14 DIAGNOSIS — M65331 Trigger finger, right middle finger: Secondary | ICD-10-CM | POA: Diagnosis not present

## 2023-01-14 NOTE — Progress Notes (Signed)
I have a new trigger finger.  She had triggering of the right thumb that Dr. Romeo Apple released. She is doing well from that.  Now she has triggering and locking of the right dominant long finger.  She would like to have surgery on it.  She says the injection will only prolong the problem.  She does have pain over the A1 pulley of the right long finger and locking.  NV intact, no redness.  Encounter Diagnosis  Name Primary?   Trigger finger, right middle finger Yes   I will have her see Dr. Romeo Apple for surgery of the right long finger.  Call if any problem.  Precautions discussed.  Electronically Signed Darreld Mclean, MD 10/30/20249:55 AM

## 2023-01-16 ENCOUNTER — Encounter (HOSPITAL_COMMUNITY): Payer: Self-pay | Admitting: Occupational Therapy

## 2023-01-16 ENCOUNTER — Ambulatory Visit (HOSPITAL_COMMUNITY): Payer: 59 | Attending: Orthopedic Surgery | Admitting: Occupational Therapy

## 2023-01-16 DIAGNOSIS — R29898 Other symptoms and signs involving the musculoskeletal system: Secondary | ICD-10-CM | POA: Insufficient documentation

## 2023-01-16 DIAGNOSIS — M25512 Pain in left shoulder: Secondary | ICD-10-CM | POA: Diagnosis not present

## 2023-01-16 DIAGNOSIS — M25612 Stiffness of left shoulder, not elsewhere classified: Secondary | ICD-10-CM | POA: Insufficient documentation

## 2023-01-16 NOTE — Therapy (Signed)
OUTPATIENT OCCUPATIONAL THERAPY ORTHO TREATMENT NOTE  Patient Name: Leah Lynch MRN: [id number] DOB:23-Jan-1946, 77 y.o., female Today's Date: 01/16/2023   END OF SESSION:  OT End of Session - 01/16/23 1227     Visit Number 17    Number of Visits 23    Date for OT Re-Evaluation 02/13/23    Authorization Type [employer] Medicare Dual Complete    Progress Note Due on Visit 25    OT Start Time 1145    OT Stop Time 1230    OT Time Calculation (min) 45 min    Activity Tolerance Patient tolerated treatment well    Behavior During Therapy WFL for tasks assessed/performed               Past Medical History:  Diagnosis Date   Fibrocystic breast    GERD (gastroesophageal reflux disease)    Hyperlipidemia    Hypertension    Osteoarthritis    Osteoporosis    PONV (postoperative nausea and vomiting)    Pre-diabetes    Shortness of breath dyspnea    Past Surgical History:  Procedure Laterality Date   ABDOMINAL HYSTERECTOMY     BIOPSY  04/15/2018   Procedure: BIOPSY;  Surgeon: Corbin Ade, MD;  [facility]: [facility];  Service: Endoscopy;;  colon   BREAST CYST EXCISION Right    BREAST SURGERY     removal of cyst from right breast-fibrocystsic   BUNIONECTOMY  12/19/2010   Procedure: BUNIONECTOMY;  Surgeon: Dallas Schimke;  Location: [facility];  Service: Orthopedics;  Laterality: Right;  [name], [name] Bunionectomy   BUNIONECTOMY  05/22/2011   Procedure: Arbutus Leas;  Surgeon: Dallas Schimke, DPM;  Location: [facility];  Service: Orthopedics;  Laterality: Left;  [name] Bunionectomy Left Foot   CAPSULOTOMY  01/29/2012   Procedure: CAPSULOTOMY;  Surgeon: Dallas Schimke, DPM;  Location: [facility];  Service: Orthopedics;  Laterality: Left;   CATARACT EXTRACTION W/PHACO Right 08/09/2015   Procedure: CATARACT EXTRACTION PHACO AND INTRAOCULAR LENS PLACEMENT RIGHT EYE CDE=7.95;  Surgeon: Gemma Payor, MD;  Location: [facility];  Service: Ophthalmology;   Laterality: Right;   CATARACT EXTRACTION W/PHACO Left 08/27/2015   Procedure: CATARACT EXTRACTION PHACO AND INTRAOCULAR LENS PLACEMENT LEFT EYE; CDE:  6.12;  Surgeon: Gemma Payor, MD;  Location: [facility];  Service: Ophthalmology;  Laterality: Left;   COLONOSCOPY  2012   Dr. Jena Gauss: internal hemorrhoids, hyperplastic polyps. Surveillance 2022   COLONOSCOPY WITH PROPOFOL N/A 04/15/2018   one 7 mm polyp at splenic flexure, one 4 mm polyp in rectum, otherwise normal. S/p segmental biopsy:normal. Normal TI   DIAGNOSTIC LAPAROSCOPY     ESOPHAGOGASTRODUODENOSCOPY (EGD) WITH PROPOFOL N/A 04/15/2018   normal   FOOT GANGLION EXCISION     right foot   METATARSAL OSTEOTOMY  05/22/2011   Procedure: METATARSAL OSTEOTOMY;  Surgeon: Dallas Schimke, DPM;  Location: [facility];  Service: Orthopedics;  Laterality: Left;  [name] Osteotomy Left Foot   POLYPECTOMY  04/15/2018   Procedure: POLYPECTOMY;  Surgeon: Corbin Ade, MD;  [facility]: [facility];  Service: Endoscopy;;  colon   REVERSE SHOULDER ARTHROPLASTY Left 09/08/2022   Procedure: LEFT REVERSE SHOULDER ARTHROPLASTY;  Surgeon: Oliver Barre, MD;  Location: [facility];  Service: Orthopedics;  Laterality: Left;  RNFA NEEDED   TONSILLECTOMY  age 9   TRIGGER FINGER RELEASE Right 01/28/2018   Procedure: RIGHT TRIGGER THUMB RELEASE;  Surgeon: Vickki Hearing, MD;  Location: [facility];  Service: Orthopedics;  Laterality: Right;  TUBAL LIGATION     Patient Active Problem List   Diagnosis Date Noted   Need for influenza vaccination 11/18/2022   Controlled type 2 diabetes mellitus without complication, without long-term current use of insulin (HCC) 09/30/2022   Rotator cuff arthropathy of left shoulder 09/08/2022   Essential hypertension 09/03/2022   GERD (gastroesophageal reflux disease) 09/03/2022   Hyperlipidemia 09/03/2022   Osteoporosis 09/03/2022   Chronic musculoskeletal pain 09/03/2022   Rotator cuff arthropathy, left 09/03/2022   Screening for  lung cancer 09/03/2022   Early satiety 02/22/2018   Loss of weight 02/22/2018   S/P trigger finger release right thumb 11/141/9 02/08/2018   Diarrhea 11/12/2016   Abdominal pain 11/12/2016   Nausea without vomiting 11/12/2016    PCP: Dr. Christel Mormon REFERRING PROVIDER: Dr. Thane Edu  ONSET DATE: 09/08/22  REFERRING DIAG: s/p left reverse TSA  THERAPY DIAG:  Stiffness of left shoulder, not elsewhere classified  Acute pain of left shoulder  Rationale for Evaluation and Treatment: Rehabilitation  SUBJECTIVE:   SUBJECTIVE STATEMENT: S: I want to do some exercising at home .   PERTINENT HISTORY: Pt is a 77 y/o female s/p left reverse TSA on 09/08/22. Pt received HH therapy for several weeks, stopped approximately 1 week ago.   PRECAUTIONS: Shoulder See Protocol  WEIGHT BEARING RESTRICTIONS: Yes NWB  PAIN:  Are you having pain? No   FALLS: Has patient fallen in last 6 months? Yes. Number of falls 1  PLOF: Independent  PATIENT GOALS: To be able to use the LUE.   NEXT MD VISIT: 03/03/23  OBJECTIVE:   HAND DOMINANCE: Right  ADLs: Overall ADLs: Pt reports she is having difficulty moving her arm for most tasks. Pt has difficulty with reaching up overhead and behind her back. She is able to dress herself, is unable to reach back behind her for pulling pants or underwear over hips. Pt is not lifting anything of weight, does not lift pots and pans. Pt is sleeping in the bed, on average sleeps ok, has difficulty when she is hurting.   FUNCTIONAL OUTCOME MEASURES: FOTO: 50/100 9/18: 56/100  UPPER EXTREMITY ROM:       Assessed in supine, er/IR adducted  Passive ROM Left eval Left 12/03/22  Shoulder flexion 120 141  Shoulder abduction 142 145  Shoulder internal rotation 90 90  Shoulder external rotation 35 45  (Blank rows = not tested)  Assessed in seated, er/IR adducted  Active ROM Left eval Left 12/03/22 Left 12/16/22 Left 01/06/23  Shoulder flexion 102 105  114 120  Shoulder abduction 76 112 112 109  Shoulder internal rotation 90 90 90 90  Shoulder external rotation 0 6 36 40  (Blank rows = not tested)    UPPER EXTREMITY MMT:     Assessed in sitting, er/IR adducted-via observation on eval  9/18: Assessed in sitting, er/IR adducted  MMT Left eval Left 12/03/22 Left 12/16/22 Left 01/06/23  Shoulder flexion 3-/5 4-/5 4/5 4/5  Shoulder abduction 3-/5 4-/5 4/5 4/5  Shoulder internal rotation 3/5 4/5 4+/5 5/5  Shoulder external rotation 3-/5 3-/5 4-/5 4/5  (Blank rows = not tested)  EDEMA: occasional with weather changes  OBSERVATIONS: mod fascial restrictions along upper arm, anterior shoulder, and trapezius regions   TODAY'S TREATMENT:  DATE:   01/16/23 -Manual Therapy: myofascial release and trigger point applied to the bicep, deltoid, trapezius, scapular region and axillary region in order to reduce fascial restrictions and pain in order to improve ROM.  -P/ROM: supine-flexion, abduction, protraction, horizontal abduction, er//IR, 5 reps -A/ROM: seated-protraction, flexion, horizontal abduction, er, abduction, 15 reps -Shoulder strength: bicep curls, hammer curls- 2 lb weight, horizontal abduction and then bringing hands together using lotion bottle as weight  - Proximal shoulder exercises: paddles, criss cross, circles and reverse circles 30 seconds 2x   01/08/23 -Manual Therapy: myofascial release and trigger point applied to the bicep, deltoid, trapezius, scapular region and axillary region in order to reduce fascial restrictions and pain in order to improve ROM.  -P/ROM: supine-flexion, abduction, protraction, horizontal abduction, er//IR, 5 reps -A/ROM: seated-protraction, flexion, horizontal abduction, er, abduction, 15 reps Stretch: external rotation with towel behind back 10x holding 5 seconds.   -Functional reach: placing 8 cones into cabinet on first shelf 2x   -UBE: 3 mins forwards and 3 mins backwards level 1 4 pace   01/06/23 -Manual Therapy: myofascial release and trigger point applied to the bicep, deltoid, trapezius, scapular region and axillary region in order to reduce fascial restrictions and pain in order to improve ROM. -P/ROM: supine-flexion, abduction, protraction, horizontal abduction, er//IR, 5 reps -A/ROM: seated-protraction, flexion, horizontal abduction, er, abduction, 15 reps -Wall Slides: flexion, abduction, x10 -Proximal shoulder strengthening: supine-paddles, criss cross, circles each direction, 10 reps    PATIENT EDUCATION: Education details: Continue HEP Person educated: Patient Education method: Programmer, multimedia, Demonstration, and Handouts Education comprehension: verbalized understanding and returned demonstration  HOME EXERCISE PROGRAM: Eval: AA/ROM 9/4: A/ROM 9/10: Wall Slides 9/18: shoulder stretches 10/17: scapular strengthening-red band 11/1: weighted ROM with water bottles/cans   GOALS: Goals reviewed with patient? Yes   SHORT TERM GOALS: Target date: 12/05/22  Pt will be provided with and educated on HEP to improve mobility in LUE required for use during ADL completion.   Goal status: IN PROGRESS  2.  Pt will increase LUE A/ROM by 30+ degrees to improve ability to use LUE during dressing tasks with minimal compensatory techniques.   Goal status: IN PROGRESS  3.  Pt will increase LUE strength to 4/5 to improve ability to lift and move pots and pans during meal preparation and cleanup tasks.   Goal status: IN PROGRESS  4.  Pt will decrease pain in LUE to 3/10 or less to improve ability to sleep for 2+ consecutive hours without waking due to pain  Goal status: IN PROGRESS  5.  Pt will decrease LUE fascial restrictions to min amounts or less to improve mobility required for functional reaching tasks.      Goal status: IN  PROGRESS   ASSESSMENT:  CLINICAL IMPRESSION: Pt reports that she continues to have no pain with her shoulder. She stated that she able to go a little further with external rotation when completing AA/ROM. Pt continues to demonstrate limited ROM due to stiffness. Added in weighted shoulder exercises to complete at home using water bottles/cans of food. Pt verbalized understanding of new HEP.   PERFORMANCE DEFICITS: in functional skills including in functional skills including ADLs, IADLs, coordination, tone, ROM, strength, pain, fascial restrictions, muscle spasms, and UE functional use   PLAN:  OT FREQUENCY: 2x/week  OT DURATION: 4 weeks  PLANNED INTERVENTIONS: self care/ADL training, therapeutic exercise, therapeutic activity, neuromuscular re-education, manual therapy, passive range of motion, splinting, electrical stimulation, ultrasound, moist heat, cryotherapy, patient/family education, and DME and/or AE  instructions  CONSULTED AND AGREED WITH PLAN OF CARE: Patient  PLAN FOR NEXT SESSION: Follow up on HEP, reassessment   Lurena Joiner, OTR/L [phone] 01/16/2023, 12:29 PM

## 2023-01-21 ENCOUNTER — Ambulatory Visit (HOSPITAL_COMMUNITY): Payer: 59 | Admitting: Occupational Therapy

## 2023-01-21 DIAGNOSIS — M25512 Pain in left shoulder: Secondary | ICD-10-CM | POA: Diagnosis not present

## 2023-01-21 DIAGNOSIS — M25612 Stiffness of left shoulder, not elsewhere classified: Secondary | ICD-10-CM | POA: Diagnosis not present

## 2023-01-21 DIAGNOSIS — R29898 Other symptoms and signs involving the musculoskeletal system: Secondary | ICD-10-CM | POA: Diagnosis not present

## 2023-01-22 ENCOUNTER — Encounter (HOSPITAL_COMMUNITY): Payer: Self-pay | Admitting: Occupational Therapy

## 2023-01-22 NOTE — Therapy (Signed)
OUTPATIENT OCCUPATIONAL THERAPY ORTHO TREATMENT NOTE  Patient Name: Leah Lynch MRN: [id number] DOB:1945-08-17, 77 y.o., female Today's Date: 01/22/2023   END OF SESSION:  OT End of Session - 01/21/23 0846     Visit Number 18    Number of Visits 23    Date for OT Re-Evaluation 02/13/23    Authorization Type [employer] Medicare Dual Complete    Progress Note Due on Visit 25    OT Start Time 0808    OT Stop Time 0846    OT Time Calculation (min) 38 min    Activity Tolerance Patient tolerated treatment well    Behavior During Therapy WFL for tasks assessed/performed               Past Medical History:  Diagnosis Date   Fibrocystic breast    GERD (gastroesophageal reflux disease)    Hyperlipidemia    Hypertension    Osteoarthritis    Osteoporosis    PONV (postoperative nausea and vomiting)    Pre-diabetes    Shortness of breath dyspnea    Past Surgical History:  Procedure Laterality Date   ABDOMINAL HYSTERECTOMY     BIOPSY  04/15/2018   Procedure: BIOPSY;  Surgeon: Corbin Ade, MD;  [facility]: [facility];  Service: Endoscopy;;  colon   BREAST CYST EXCISION Right    BREAST SURGERY     removal of cyst from right breast-fibrocystsic   BUNIONECTOMY  12/19/2010   Procedure: BUNIONECTOMY;  Surgeon: Dallas Schimke;  Location: [facility];  Service: Orthopedics;  Laterality: Right;  [name], [name] Bunionectomy   BUNIONECTOMY  05/22/2011   Procedure: Arbutus Leas;  Surgeon: Dallas Schimke, DPM;  Location: [facility];  Service: Orthopedics;  Laterality: Left;  [name] Bunionectomy Left Foot   CAPSULOTOMY  01/29/2012   Procedure: CAPSULOTOMY;  Surgeon: Dallas Schimke, DPM;  Location: [facility];  Service: Orthopedics;  Laterality: Left;   CATARACT EXTRACTION W/PHACO Right 08/09/2015   Procedure: CATARACT EXTRACTION PHACO AND INTRAOCULAR LENS PLACEMENT RIGHT EYE CDE=7.95;  Surgeon: Gemma Payor, MD;  Location: [facility];  Service: Ophthalmology;   Laterality: Right;   CATARACT EXTRACTION W/PHACO Left 08/27/2015   Procedure: CATARACT EXTRACTION PHACO AND INTRAOCULAR LENS PLACEMENT LEFT EYE; CDE:  6.12;  Surgeon: Gemma Payor, MD;  Location: [facility];  Service: Ophthalmology;  Laterality: Left;   COLONOSCOPY  2012   Dr. Jena Gauss: internal hemorrhoids, hyperplastic polyps. Surveillance 2022   COLONOSCOPY WITH PROPOFOL N/A 04/15/2018   one 7 mm polyp at splenic flexure, one 4 mm polyp in rectum, otherwise normal. S/p segmental biopsy:normal. Normal TI   DIAGNOSTIC LAPAROSCOPY     ESOPHAGOGASTRODUODENOSCOPY (EGD) WITH PROPOFOL N/A 04/15/2018   normal   FOOT GANGLION EXCISION     right foot   METATARSAL OSTEOTOMY  05/22/2011   Procedure: METATARSAL OSTEOTOMY;  Surgeon: Dallas Schimke, DPM;  Location: [facility];  Service: Orthopedics;  Laterality: Left;  [name] Osteotomy Left Foot   POLYPECTOMY  04/15/2018   Procedure: POLYPECTOMY;  Surgeon: Corbin Ade, MD;  [facility];  Service: Endoscopy;;  colon   REVERSE SHOULDER ARTHROPLASTY Left 09/08/2022   Procedure: LEFT REVERSE SHOULDER ARTHROPLASTY;  Surgeon: Oliver Barre, MD;  Location: [facility];  Service: Orthopedics;  Laterality: Left;  RNFA NEEDED   TONSILLECTOMY  age 33   TRIGGER FINGER RELEASE Right 01/28/2018   Procedure: RIGHT TRIGGER THUMB RELEASE;  Surgeon: Vickki Hearing, MD;  Location: [facility];  Service: Orthopedics;  Laterality: Right;  TUBAL LIGATION     Patient Active Problem List   Diagnosis Date Noted   Need for influenza vaccination 11/18/2022   Controlled type 2 diabetes mellitus without complication, without long-term current use of insulin (HCC) 09/30/2022   Rotator cuff arthropathy of left shoulder 09/08/2022   Essential hypertension 09/03/2022   GERD (gastroesophageal reflux disease) 09/03/2022   Hyperlipidemia 09/03/2022   Osteoporosis 09/03/2022   Chronic musculoskeletal pain 09/03/2022   Rotator cuff arthropathy, left 09/03/2022   Screening for  lung cancer 09/03/2022   Early satiety 02/22/2018   Loss of weight 02/22/2018   S/P trigger finger release right thumb 11/141/9 02/08/2018   Diarrhea 11/12/2016   Abdominal pain 11/12/2016   Nausea without vomiting 11/12/2016    PCP: Dr. Christel Mormon REFERRING PROVIDER: Dr. Thane Edu  ONSET DATE: 09/08/22  REFERRING DIAG: s/p left reverse TSA  THERAPY DIAG:  Stiffness of left shoulder, not elsewhere classified  Acute pain of left shoulder  Other symptoms and signs involving the musculoskeletal system  Rationale for Evaluation and Treatment: Rehabilitation  SUBJECTIVE:   SUBJECTIVE STATEMENT: S: I want to do some exercising at home .   PERTINENT HISTORY: Pt is a 77 y/o female s/p left reverse TSA on 09/08/22. Pt received HH therapy for several weeks, stopped approximately 1 week ago.   PRECAUTIONS: Shoulder See Protocol  WEIGHT BEARING RESTRICTIONS: Yes NWB  PAIN:  Are you having pain? No   FALLS: Has patient fallen in last 6 months? Yes. Number of falls 1  PLOF: Independent  PATIENT GOALS: To be able to use the LUE.   NEXT MD VISIT: 03/03/23  OBJECTIVE:   HAND DOMINANCE: Right  ADLs: Overall ADLs: Pt reports she is having difficulty moving her arm for most tasks. Pt has difficulty with reaching up overhead and behind her back. She is able to dress herself, is unable to reach back behind her for pulling pants or underwear over hips. Pt is not lifting anything of weight, does not lift pots and pans. Pt is sleeping in the bed, on average sleeps ok, has difficulty when she is hurting.   FUNCTIONAL OUTCOME MEASURES: FOTO: 50/100 9/18: 56/100  UPPER EXTREMITY ROM:       Assessed in supine, er/IR adducted  Passive ROM Left eval Left 12/03/22  Shoulder flexion 120 141  Shoulder abduction 142 145  Shoulder internal rotation 90 90  Shoulder external rotation 35 45  (Blank rows = not tested)  Assessed in seated, er/IR adducted  Active ROM Left eval  Left 12/03/22 Left 12/16/22 Left 01/06/23  Shoulder flexion 102 105 114 120  Shoulder abduction 76 112 112 109  Shoulder internal rotation 90 90 90 90  Shoulder external rotation 0 6 36 40  (Blank rows = not tested)    UPPER EXTREMITY MMT:     Assessed in sitting, er/IR adducted-via observation on eval  9/18: Assessed in sitting, er/IR adducted  MMT Left eval Left 12/03/22 Left 12/16/22 Left 01/06/23  Shoulder flexion 3-/5 4-/5 4/5 4/5  Shoulder abduction 3-/5 4-/5 4/5 4/5  Shoulder internal rotation 3/5 4/5 4+/5 5/5  Shoulder external rotation 3-/5 3-/5 4-/5 4/5  (Blank rows = not tested)  EDEMA: occasional with weather changes  OBSERVATIONS: mod fascial restrictions along upper arm, anterior shoulder, and trapezius regions   TODAY'S TREATMENT:  DATE:   01/22/23 -Manual Therapy: myofascial release and trigger point applied to the bicep, deltoid, trapezius, scapular region and axillary region in order to reduce fascial restrictions and pain in order to improve ROM. -A/ROM: seated-protraction, flexion, horizontal abduction, er, abduction, 15 reps -Stretch: external rotation with towel behind back 4x10", er on the wall 4x10" -Wall slides: flexion, abduction, x10 -PNF strengthening: green band, chest pulls, overhead pulls, er pulls, PNF up, PNF down, x10  01/16/23 -Manual Therapy: myofascial release and trigger point applied to the bicep, deltoid, trapezius, scapular region and axillary region in order to reduce fascial restrictions and pain in order to improve ROM.  -P/ROM: supine-flexion, abduction, protraction, horizontal abduction, er//IR, 5 reps -A/ROM: seated-protraction, flexion, horizontal abduction, er, abduction, 15 reps -Shoulder strength: bicep curls, hammer curls- 2 lb weight, horizontal abduction and then bringing hands together using lotion  bottle as weight  - Proximal shoulder exercises: paddles, criss cross, circles and reverse circles 30 seconds 2x   01/08/23 -Manual Therapy: myofascial release and trigger point applied to the bicep, deltoid, trapezius, scapular region and axillary region in order to reduce fascial restrictions and pain in order to improve ROM.  -P/ROM: supine-flexion, abduction, protraction, horizontal abduction, er//IR, 5 reps -A/ROM: seated-protraction, flexion, horizontal abduction, er, abduction, 15 reps Stretch: external rotation with towel behind back 10x holding 5 seconds.  -Functional reach: placing 8 cones into cabinet on first shelf 2x   -UBE: 3 mins forwards and 3 mins backwards level 1 4 pace    PATIENT EDUCATION: Education details: Continue HEP Person educated: Patient Education method: Programmer, multimedia, Demonstration, and Handouts Education comprehension: verbalized understanding and returned demonstration  HOME EXERCISE PROGRAM: Eval: AA/ROM 9/4: A/ROM 9/10: Wall Slides 9/18: shoulder stretches 10/17: scapular strengthening-red band 11/1: weighted ROM with water bottles/cans   GOALS: Goals reviewed with patient? Yes   SHORT TERM GOALS: Target date: 12/05/22  Pt will be provided with and educated on HEP to improve mobility in LUE required for use during ADL completion.   Goal status: IN PROGRESS  2.  Pt will increase LUE A/ROM by 30+ degrees to improve ability to use LUE during dressing tasks with minimal compensatory techniques.   Goal status: IN PROGRESS  3.  Pt will increase LUE strength to 4/5 to improve ability to lift and move pots and pans during meal preparation and cleanup tasks.   Goal status: IN PROGRESS  4.  Pt will decrease pain in LUE to 3/10 or less to improve ability to sleep for 2+ consecutive hours without waking due to pain  Goal status: IN PROGRESS  5.  Pt will decrease LUE fascial restrictions to min amounts or less to improve mobility required for  functional reaching tasks.      Goal status: IN PROGRESS   ASSESSMENT:  CLINICAL IMPRESSION: This session pt was able to work more on overall strengthening with PNF pattern exercises for stability and strength in the shoulder girdle. She also continued with stretching to assist with improving her overall mobility and ROM against gravity, specifically with external rotation. OT providing verbal and tactile cuing for positioning and technique.   PERFORMANCE DEFICITS: in functional skills including in functional skills including ADLs, IADLs, coordination, tone, ROM, strength, pain, fascial restrictions, muscle spasms, and UE functional use   PLAN:  OT FREQUENCY: 2x/week  OT DURATION: 4 weeks  PLANNED INTERVENTIONS: self care/ADL training, therapeutic exercise, therapeutic activity, neuromuscular re-education, manual therapy, passive range of motion, splinting, electrical stimulation, ultrasound, moist heat, cryotherapy, patient/family education, and  DME and/or AE instructions  CONSULTED AND AGREED WITH PLAN OF CARE: Patient  PLAN FOR NEXT SESSION: Follow up on HEP, shoulder strengthening exercises   Trish Mage, OTR/L [phone] 01/22/2023, 12:52 PM

## 2023-01-23 ENCOUNTER — Encounter (HOSPITAL_COMMUNITY): Payer: Self-pay | Admitting: Occupational Therapy

## 2023-01-23 ENCOUNTER — Ambulatory Visit (HOSPITAL_COMMUNITY): Payer: 59 | Admitting: Occupational Therapy

## 2023-01-23 DIAGNOSIS — R29898 Other symptoms and signs involving the musculoskeletal system: Secondary | ICD-10-CM | POA: Diagnosis not present

## 2023-01-23 DIAGNOSIS — M25612 Stiffness of left shoulder, not elsewhere classified: Secondary | ICD-10-CM

## 2023-01-23 DIAGNOSIS — M25512 Pain in left shoulder: Secondary | ICD-10-CM | POA: Diagnosis not present

## 2023-01-23 NOTE — Therapy (Unsigned)
OUTPATIENT OCCUPATIONAL THERAPY ORTHO TREATMENT NOTE  Patient Name: Leah Lynch MRN: [id number] DOB:09-01-1945, 77 y.o., female Today's Date: 01/23/2023   END OF SESSION:      Past Medical History:  Diagnosis Date   Fibrocystic breast    GERD (gastroesophageal reflux disease)    Hyperlipidemia    Hypertension    Osteoarthritis    Osteoporosis    PONV (postoperative nausea and vomiting)    Pre-diabetes    Shortness of breath dyspnea    Past Surgical History:  Procedure Laterality Date   ABDOMINAL HYSTERECTOMY     BIOPSY  04/15/2018   Procedure: BIOPSY;  Surgeon: Corbin Ade, MD;  [facility]: [facility];  Service: Endoscopy;;  colon   BREAST CYST EXCISION Right    BREAST SURGERY     removal of cyst from right breast-fibrocystsic   BUNIONECTOMY  12/19/2010   Procedure: BUNIONECTOMY;  Surgeon: Dallas Schimke;  Location: [facility];  Service: Orthopedics;  Laterality: Right;  Leah Lynch, [name] Bunionectomy   BUNIONECTOMY  05/22/2011   Procedure: Arbutus Leas;  Surgeon: Dallas Schimke, DPM;  Location: [facility];  Service: Orthopedics;  Laterality: Left;  [name] Bunionectomy Left Foot   CAPSULOTOMY  01/29/2012   Procedure: CAPSULOTOMY;  Surgeon: Dallas Schimke, DPM;  Location: [facility];  Service: Orthopedics;  Laterality: Left;   CATARACT EXTRACTION W/PHACO Right 08/09/2015   Procedure: CATARACT EXTRACTION PHACO AND INTRAOCULAR LENS PLACEMENT RIGHT EYE CDE=7.95;  Surgeon: Gemma Payor, MD;  Location: [facility];  Service: Ophthalmology;  Laterality: Right;   CATARACT EXTRACTION W/PHACO Left 08/27/2015   Procedure: CATARACT EXTRACTION PHACO AND INTRAOCULAR LENS PLACEMENT LEFT EYE; CDE:  6.12;  Surgeon: Gemma Payor, MD;  Location: [facility];  Service: Ophthalmology;  Laterality: Left;   COLONOSCOPY  2012   Dr. Jena Gauss: internal hemorrhoids, hyperplastic polyps. Surveillance 2022   COLONOSCOPY WITH PROPOFOL N/A 04/15/2018   one 7 mm polyp at splenic  flexure, one 4 mm polyp in rectum, otherwise normal. S/p segmental biopsy:normal. Normal TI   DIAGNOSTIC LAPAROSCOPY     ESOPHAGOGASTRODUODENOSCOPY (EGD) WITH PROPOFOL N/A 04/15/2018   normal   FOOT GANGLION EXCISION     right foot   METATARSAL OSTEOTOMY  05/22/2011   Procedure: METATARSAL OSTEOTOMY;  Surgeon: Dallas Schimke, DPM;  Location: [facility];  Service: Orthopedics;  Laterality: Left;  [name] Osteotomy Left Foot   POLYPECTOMY  04/15/2018   Procedure: POLYPECTOMY;  Surgeon: Corbin Ade, MD;  [facility]: [facility];  Service: Endoscopy;;  colon   REVERSE SHOULDER ARTHROPLASTY Left 09/08/2022   Procedure: LEFT REVERSE SHOULDER ARTHROPLASTY;  Surgeon: Oliver Barre, MD;  Location: [facility];  Service: Orthopedics;  Laterality: Left;  RNFA NEEDED   TONSILLECTOMY  age 70   TRIGGER FINGER RELEASE Right 01/28/2018   Procedure: RIGHT TRIGGER THUMB RELEASE;  Surgeon: Vickki Hearing, MD;  Location: [facility];  Service: Orthopedics;  Laterality: Right;   TUBAL LIGATION     Patient Active Problem List   Diagnosis Date Noted   Need for influenza vaccination 11/18/2022   Controlled type 2 diabetes mellitus without complication, without long-term current use of insulin (HCC) 09/30/2022   Rotator cuff arthropathy of left shoulder 09/08/2022   Essential hypertension 09/03/2022   GERD (gastroesophageal reflux disease) 09/03/2022   Hyperlipidemia 09/03/2022   Osteoporosis 09/03/2022   Chronic musculoskeletal pain 09/03/2022   Rotator cuff arthropathy, left 09/03/2022   Screening for lung cancer 09/03/2022   Early satiety 02/22/2018   Loss of weight  02/22/2018   S/P trigger finger release right thumb 11/141/9 02/08/2018   Diarrhea 11/12/2016   Abdominal pain 11/12/2016   Nausea without vomiting 11/12/2016    PCP: Dr. Christel Mormon REFERRING PROVIDER: Dr. Thane Edu  ONSET DATE: 09/08/22  REFERRING DIAG: s/p left reverse TSA  THERAPY DIAG:  No diagnosis found.  Rationale for  Evaluation and Treatment: Rehabilitation  SUBJECTIVE:   SUBJECTIVE STATEMENT: S: I want to do some exercising at home .   PERTINENT HISTORY: Pt is a 77 y/o female s/p left reverse TSA on 09/08/22. Pt received HH therapy for several weeks, stopped approximately 1 week ago.   PRECAUTIONS: Shoulder See Protocol  WEIGHT BEARING RESTRICTIONS: Yes NWB  PAIN:  Are you having pain? No   FALLS: Has patient fallen in last 6 months? Yes. Number of falls 1  PLOF: Independent  PATIENT GOALS: To be able to use the LUE.   NEXT MD VISIT: 03/03/23  OBJECTIVE:   HAND DOMINANCE: Right  ADLs: Overall ADLs: Pt reports she is having difficulty moving her arm for most tasks. Pt has difficulty with reaching up overhead and behind her back. She is able to dress herself, is unable to reach back behind her for pulling pants or underwear over hips. Pt is not lifting anything of weight, does not lift pots and pans. Pt is sleeping in the bed, on average sleeps ok, has difficulty when she is hurting.   FUNCTIONAL OUTCOME MEASURES: FOTO: 50/100 9/18: 56/100  UPPER EXTREMITY ROM:       Assessed in supine, er/IR adducted  Passive ROM Left eval Left 12/03/22  Shoulder flexion 120 141  Shoulder abduction 142 145  Shoulder internal rotation 90 90  Shoulder external rotation 35 45  (Blank rows = not tested)  Assessed in seated, er/IR adducted  Active ROM Left eval Left 12/03/22 Left 12/16/22 Left 01/06/23  Shoulder flexion 102 105 114 120  Shoulder abduction 76 112 112 109  Shoulder internal rotation 90 90 90 90  Shoulder external rotation 0 6 36 40  (Blank rows = not tested)    UPPER EXTREMITY MMT:     Assessed in sitting, er/IR adducted-via observation on eval  9/18: Assessed in sitting, er/IR adducted  MMT Left eval Left 12/03/22 Left 12/16/22 Left 01/06/23  Shoulder flexion 3-/5 4-/5 4/5 4/5  Shoulder abduction 3-/5 4-/5 4/5 4/5  Shoulder internal rotation 3/5 4/5 4+/5 5/5   Shoulder external rotation 3-/5 3-/5 4-/5 4/5  (Blank rows = not tested)  EDEMA: occasional with weather changes  OBSERVATIONS: mod fascial restrictions along upper arm, anterior shoulder, and trapezius regions   TODAY'S TREATMENT:                                                                                                                              DATE:   01/23/23 -Manual Therapy: myofascial release and trigger point applied to the bicep, deltoid, trapezius, scapular region and axillary region in  order to reduce fascial restrictions and pain in order to improve ROM. -A/ROM: seated-protraction, flexion, horizontal abduction, er, abduction, x15 -AA/ROM: seated, er, x15 -Loop Band Exercises: green band, wall taps, V ups, wall clock, x10 - mod difficulty  01/21/23 -Manual Therapy: myofascial release and trigger point applied to the bicep, deltoid, trapezius, scapular region and axillary region in order to reduce fascial restrictions and pain in order to improve ROM. -A/ROM: seated-protraction, flexion, horizontal abduction, er, abduction, 15 reps -Stretch: external rotation with towel behind back 4x10", er on the wall 4x10" -Wall slides: flexion, abduction, x10 -PNF strengthening: green band, chest pulls, overhead pulls, er pulls, PNF up, PNF down, x10  01/16/23 -Manual Therapy: myofascial release and trigger point applied to the bicep, deltoid, trapezius, scapular region and axillary region in order to reduce fascial restrictions and pain in order to improve ROM.  -P/ROM: supine-flexion, abduction, protraction, horizontal abduction, er//IR, 5 reps -A/ROM: seated-protraction, flexion, horizontal abduction, er, abduction, 15 reps -Shoulder strength: bicep curls, hammer curls- 2 lb weight, horizontal abduction and then bringing hands together using lotion bottle as weight  - Proximal shoulder exercises: paddles, criss cross, circles and reverse circles 30 seconds 2x   01/08/23 -Manual  Therapy: myofascial release and trigger point applied to the bicep, deltoid, trapezius, scapular region and axillary region in order to reduce fascial restrictions and pain in order to improve ROM.  -P/ROM: supine-flexion, abduction, protraction, horizontal abduction, er//IR, 5 reps -A/ROM: seated-protraction, flexion, horizontal abduction, er, abduction, 15 reps Stretch: external rotation with towel behind back 10x holding 5 seconds.  -Functional reach: placing 8 cones into cabinet on first shelf 2x   -UBE: 3 mins forwards and 3 mins backwards level 1 4 pace    PATIENT EDUCATION: Education details: Continue HEP Person educated: Patient Education method: Programmer, multimedia, Demonstration, and Handouts Education comprehension: verbalized understanding and returned demonstration  HOME EXERCISE PROGRAM: Eval: AA/ROM 9/4: A/ROM 9/10: Wall Slides 9/18: shoulder stretches 10/17: scapular strengthening-red band 11/1: weighted ROM with water bottles/cans   GOALS: Goals reviewed with patient? Yes   SHORT TERM GOALS: Target date: 12/05/22  Pt will be provided with and educated on HEP to improve mobility in LUE required for use during ADL completion.   Goal status: IN PROGRESS  2.  Pt will increase LUE A/ROM by 30+ degrees to improve ability to use LUE during dressing tasks with minimal compensatory techniques.   Goal status: IN PROGRESS  3.  Pt will increase LUE strength to 4/5 to improve ability to lift and move pots and pans during meal preparation and cleanup tasks.   Goal status: IN PROGRESS  4.  Pt will decrease pain in LUE to 3/10 or less to improve ability to sleep for 2+ consecutive hours without waking due to pain  Goal status: IN PROGRESS  5.  Pt will decrease LUE fascial restrictions to min amounts or less to improve mobility required for functional reaching tasks.      Goal status: IN PROGRESS   ASSESSMENT:  CLINICAL IMPRESSION: This session pt was able to work more on  overall strengthening with PNF pattern exercises for stability and strength in the shoulder girdle. She also continued with stretching to assist with improving her overall mobility and ROM against gravity, specifically with external rotation. OT providing verbal and tactile cuing for positioning and technique.   PERFORMANCE DEFICITS: in functional skills including in functional skills including ADLs, IADLs, coordination, tone, ROM, strength, pain, fascial restrictions, muscle spasms, and UE functional use   PLAN:  OT FREQUENCY: 2x/week  OT DURATION: 4 weeks  PLANNED INTERVENTIONS: self care/ADL training, therapeutic exercise, therapeutic activity, neuromuscular re-education, manual therapy, passive range of motion, splinting, electrical stimulation, ultrasound, moist heat, cryotherapy, patient/family education, and DME and/or AE instructions  CONSULTED AND AGREED WITH PLAN OF CARE: Patient  PLAN FOR NEXT SESSION: Follow up on HEP, shoulder strengthening exercises   Trish Mage, OTR/L [phone] 01/23/2023, 9:37 AM

## 2023-01-26 ENCOUNTER — Ambulatory Visit (HOSPITAL_COMMUNITY): Payer: 59 | Admitting: Occupational Therapy

## 2023-01-26 ENCOUNTER — Encounter (HOSPITAL_COMMUNITY): Payer: Self-pay | Admitting: Occupational Therapy

## 2023-01-26 DIAGNOSIS — R29898 Other symptoms and signs involving the musculoskeletal system: Secondary | ICD-10-CM

## 2023-01-26 DIAGNOSIS — M25612 Stiffness of left shoulder, not elsewhere classified: Secondary | ICD-10-CM

## 2023-01-26 DIAGNOSIS — M25512 Pain in left shoulder: Secondary | ICD-10-CM

## 2023-01-26 NOTE — Patient Instructions (Signed)
Complete _______ repetitions each. Complete _______ time a day.  Wall taps with theraband  With a looped elastic band around forearms/wrists and arms at a 90 angle pressed against the wall. Keeping elbows on the wall, tap right arm out to the right. Hold for 1 second. Return right arm back to neutral. Repeat with left arm.    Wall V slides with Theraband  Place a band loop around hands/forearms and face a wall. Extend both arms diagonally into a V shape on the wall. Hold this stretch for specified amount of time. Lower arms slowly back into neutral position. Repeat.       scap clocks  Tie a loop with a theraband and place around your wrists.  Stand with a wall in front of you.  Picture a clock in front of you.  Place both palms on the wall, arms straight.  While keeping the left/right hand planted, use the right/left hand to pull away and tap each number (1, 3, 5- right OR 11,9,7 left), coming back to center each time.

## 2023-01-26 NOTE — Therapy (Signed)
OUTPATIENT OCCUPATIONAL THERAPY ORTHO TREATMENT NOTE  Patient Name: Leah Lynch MRN: [id number] DOB:11-Dec-1945, 77 y.o., female Today's Date: 01/26/2023   END OF SESSION:  OT End of Session - 01/26/23 1257     Visit Number 20    Number of Visits 23    Date for OT Re-Evaluation 02/13/23    Authorization Type [employer] Medicare Dual Complete    Progress Note Due on Visit 25    OT Start Time 1145    OT Stop Time 1230    OT Time Calculation (min) 45 min    Activity Tolerance Patient tolerated treatment well    Behavior During Therapy WFL for tasks assessed/performed             Past Medical History:  Diagnosis Date   Fibrocystic breast    GERD (gastroesophageal reflux disease)    Hyperlipidemia    Hypertension    Osteoarthritis    Osteoporosis    PONV (postoperative nausea and vomiting)    Pre-diabetes    Shortness of breath dyspnea    Past Surgical History:  Procedure Laterality Date   ABDOMINAL HYSTERECTOMY     BIOPSY  04/15/2018   Procedure: BIOPSY;  Surgeon: Corbin Ade, MD;  [facility];  Service: Endoscopy;;  colon   BREAST CYST EXCISION Right    BREAST SURGERY     removal of cyst from right breast-fibrocystsic   BUNIONECTOMY  12/19/2010   Procedure: BUNIONECTOMY;  Surgeon: Dallas Schimke;  Location: [facility];  Service: Orthopedics;  Laterality: Right;  [name], [name] Bunionectomy   BUNIONECTOMY  05/22/2011   Procedure: Arbutus Leas;  Surgeon: Dallas Schimke, DPM;  Location: [facility];  Service: Orthopedics;  Laterality: Left;  [name] Bunionectomy Left Foot   CAPSULOTOMY  01/29/2012   Procedure: CAPSULOTOMY;  Surgeon: Dallas Schimke, DPM;  Location: [facility];  Service: Orthopedics;  Laterality: Left;   CATARACT EXTRACTION W/PHACO Right 08/09/2015   Procedure: CATARACT EXTRACTION PHACO AND INTRAOCULAR LENS PLACEMENT RIGHT EYE CDE=7.95;  Surgeon: Gemma Payor, MD;  Location: [facility];  Service: Ophthalmology;   Laterality: Right;   CATARACT EXTRACTION W/PHACO Left 08/27/2015   Procedure: CATARACT EXTRACTION PHACO AND INTRAOCULAR LENS PLACEMENT LEFT EYE; CDE:  6.12;  Surgeon: Gemma Payor, MD;  Location: [facility];  Service: Ophthalmology;  Laterality: Left;   COLONOSCOPY  2012   Dr. Jena Gauss: internal hemorrhoids, hyperplastic polyps. Surveillance 2022   COLONOSCOPY WITH PROPOFOL N/A 04/15/2018   one 7 mm polyp at splenic flexure, one 4 mm polyp in rectum, otherwise normal. S/p segmental biopsy:normal. Normal TI   DIAGNOSTIC LAPAROSCOPY     ESOPHAGOGASTRODUODENOSCOPY (EGD) WITH PROPOFOL N/A 04/15/2018   normal   FOOT GANGLION EXCISION     right foot   METATARSAL OSTEOTOMY  05/22/2011   Procedure: METATARSAL OSTEOTOMY;  Surgeon: Dallas Schimke, DPM;  Location: [facility];  Service: Orthopedics;  Laterality: Left;  [name] Osteotomy Left Foot   POLYPECTOMY  04/15/2018   Procedure: POLYPECTOMY;  Surgeon: Corbin Ade, MD;  [facility];  Service: Endoscopy;;  colon   REVERSE SHOULDER ARTHROPLASTY Left 09/08/2022   Procedure: LEFT REVERSE SHOULDER ARTHROPLASTY;  Surgeon: Oliver Barre, MD;  Location: [facility];  Service: Orthopedics;  Laterality: Left;  RNFA NEEDED   TONSILLECTOMY  age 44   TRIGGER FINGER RELEASE Right 01/28/2018   Procedure: RIGHT TRIGGER THUMB RELEASE;  Surgeon: Vickki Hearing, MD;  Location: [facility];  Service: Orthopedics;  Laterality: Right;   TUBAL  LIGATION     Patient Active Problem List   Diagnosis Date Noted   Need for influenza vaccination 11/18/2022   Controlled type 2 diabetes mellitus without complication, without long-term current use of insulin (HCC) 09/30/2022   Rotator cuff arthropathy of left shoulder 09/08/2022   Essential hypertension 09/03/2022   GERD (gastroesophageal reflux disease) 09/03/2022   Hyperlipidemia 09/03/2022   Osteoporosis 09/03/2022   Chronic musculoskeletal pain 09/03/2022   Rotator cuff arthropathy, left 09/03/2022   Screening for  lung cancer 09/03/2022   Early satiety 02/22/2018   Loss of weight 02/22/2018   S/P trigger finger release right thumb 11/141/9 02/08/2018   Diarrhea 11/12/2016   Abdominal pain 11/12/2016   Nausea without vomiting 11/12/2016    PCP: Dr. Christel Mormon REFERRING PROVIDER: Dr. Thane Edu  ONSET DATE: 09/08/22  REFERRING DIAG: s/p left reverse TSA  THERAPY DIAG:  Stiffness of left shoulder, not elsewhere classified  Acute pain of left shoulder  Other symptoms and signs involving the musculoskeletal system  Rationale for Evaluation and Treatment: Rehabilitation  SUBJECTIVE:   SUBJECTIVE STATEMENT: S: I've been using my cans a lot.   PERTINENT HISTORY: Pt is a 77 y/o female s/p left reverse TSA on 09/08/22. Pt received HH therapy for several weeks, stopped approximately 1 week ago.   PRECAUTIONS: Shoulder See Protocol  WEIGHT BEARING RESTRICTIONS: Yes NWB  PAIN:  Are you having pain? No   FALLS: Has patient fallen in last 6 months? Yes. Number of falls 1  PLOF: Independent  PATIENT GOALS: To be able to use the LUE.   NEXT MD VISIT: 03/03/23  OBJECTIVE:   HAND DOMINANCE: Right  ADLs: Overall ADLs: Pt reports she is having difficulty moving her arm for most tasks. Pt has difficulty with reaching up overhead and behind her back. She is able to dress herself, is unable to reach back behind her for pulling pants or underwear over hips. Pt is not lifting anything of weight, does not lift pots and pans. Pt is sleeping in the bed, on average sleeps ok, has difficulty when she is hurting.   FUNCTIONAL OUTCOME MEASURES: FOTO: 50/100 9/18: 56/100  UPPER EXTREMITY ROM:       Assessed in supine, er/IR adducted  Passive ROM Left eval Left 12/03/22  Shoulder flexion 120 141  Shoulder abduction 142 145  Shoulder internal rotation 90 90  Shoulder external rotation 35 45  (Blank rows = not tested)  Assessed in seated, er/IR adducted  Active ROM Left eval Left 12/03/22  Left 12/16/22 Left 01/06/23  Shoulder flexion 102 105 114 120  Shoulder abduction 76 112 112 109  Shoulder internal rotation 90 90 90 90  Shoulder external rotation 0 6 36 40  (Blank rows = not tested)    UPPER EXTREMITY MMT:     Assessed in sitting, er/IR adducted-via observation on eval  9/18: Assessed in sitting, er/IR adducted  MMT Left eval Left 12/03/22 Left 12/16/22 Left 01/06/23  Shoulder flexion 3-/5 4-/5 4/5 4/5  Shoulder abduction 3-/5 4-/5 4/5 4/5  Shoulder internal rotation 3/5 4/5 4+/5 5/5  Shoulder external rotation 3-/5 3-/5 4-/5 4/5  (Blank rows = not tested)  EDEMA: occasional with weather changes  OBSERVATIONS: mod fascial restrictions along upper arm, anterior shoulder, and trapezius regions   TODAY'S TREATMENT:  DATE:   01/26/23 -Manual Therapy: myofascial release and trigger point applied to the bicep, deltoid, trapezius, scapular region and axillary region in order to reduce fascial restrictions and pain in order to improve ROM. -A/ROM: seated-protraction, flexion, horizontal abduction, er, abduction, x15 -Wall slides: flexion, abduction, x10 -Loop Band Exercises: green band, wall taps, V ups, wall clock, x15  01/23/23 -Manual Therapy: myofascial release and trigger point applied to the bicep, deltoid, trapezius, scapular region and axillary region in order to reduce fascial restrictions and pain in order to improve ROM. -A/ROM: seated-protraction, flexion, horizontal abduction, er, abduction, x15 -AA/ROM: seated, er, x15 -Loop Band Exercises: green band, wall taps, V ups, wall clock, x10 - mod difficulty  01/21/23 -Manual Therapy: myofascial release and trigger point applied to the bicep, deltoid, trapezius, scapular region and axillary region in order to reduce fascial restrictions and pain in order to improve ROM. -A/ROM:  seated-protraction, flexion, horizontal abduction, er, abduction, 15 reps -Stretch: external rotation with towel behind back 4x10", er on the wall 4x10" -Wall slides: flexion, abduction, x10 -PNF strengthening: green band, chest pulls, overhead pulls, er pulls, PNF up, PNF down, x10   PATIENT EDUCATION: Education details: Loop Band Exercises Person educated: Patient Education method: Explanation, Demonstration, and Handouts Education comprehension: verbalized understanding and returned demonstration  HOME EXERCISE PROGRAM: Eval: AA/ROM 9/4: A/ROM 9/10: Wall Slides 9/18: shoulder stretches 10/17: scapular strengthening-red band 11/1: weighted ROM with water bottles/cans  11/11: Loop Band Exercises  GOALS: Goals reviewed with patient? Yes   SHORT TERM GOALS: Target date: 12/05/22  Pt will be provided with and educated on HEP to improve mobility in LUE required for use during ADL completion.   Goal status: IN PROGRESS  2.  Pt will increase LUE A/ROM by 30+ degrees to improve ability to use LUE during dressing tasks with minimal compensatory techniques.   Goal status: IN PROGRESS  3.  Pt will increase LUE strength to 4/5 to improve ability to lift and move pots and pans during meal preparation and cleanup tasks.   Goal status: IN PROGRESS  4.  Pt will decrease pain in LUE to 3/10 or less to improve ability to sleep for 2+ consecutive hours without waking due to pain  Goal status: IN PROGRESS  5.  Pt will decrease LUE fascial restrictions to min amounts or less to improve mobility required for functional reaching tasks.      Goal status: IN PROGRESS   ASSESSMENT:  CLINICAL IMPRESSION: Pt continuing to have increased stiffness and mild to moderate pain. She completed loop band exercises with red band this session, experiencing moderate fatigue, but no increased pain. With all exercises this session she demonstrating improved movement pattern, with continued low tolerance  due to muscle fatigue. OT providing verbal and tactile cuing for positioning and technique throughout session.   PERFORMANCE DEFICITS: in functional skills including in functional skills including ADLs, IADLs, coordination, tone, ROM, strength, pain, fascial restrictions, muscle spasms, and UE functional use   PLAN:  OT FREQUENCY: 2x/week  OT DURATION: 4 weeks  PLANNED INTERVENTIONS: self care/ADL training, therapeutic exercise, therapeutic activity, neuromuscular re-education, manual therapy, passive range of motion, splinting, electrical stimulation, ultrasound, moist heat, cryotherapy, patient/family education, and DME and/or AE instructions  CONSULTED AND AGREED WITH PLAN OF CARE: Patient  PLAN FOR NEXT SESSION: Follow up on HEP, shoulder strengthening exercises   Trish Mage, OTR/L [phone] 01/26/2023, 12:58 PM

## 2023-01-28 ENCOUNTER — Ambulatory Visit (INDEPENDENT_AMBULATORY_CARE_PROVIDER_SITE_OTHER): Payer: 59 | Admitting: Orthopedic Surgery

## 2023-01-28 ENCOUNTER — Encounter: Payer: Self-pay | Admitting: Orthopedic Surgery

## 2023-01-28 VITALS — BP 148/75 | HR 70 | Ht 59.0 in | Wt 140.0 lb

## 2023-01-28 DIAGNOSIS — Z01818 Encounter for other preprocedural examination: Secondary | ICD-10-CM

## 2023-01-28 DIAGNOSIS — M65331 Trigger finger, right middle finger: Secondary | ICD-10-CM | POA: Diagnosis not present

## 2023-01-28 NOTE — Progress Notes (Signed)
Office Visit Note   Patient: Leah Lynch           Date of Birth: 06-28-1945           MRN: [id number] Visit Date: 01/28/2023 Requested by: Billie Lade, MD [address] [place],  [place] [address] PCP: Billie Lade, MD   Assessment & Plan:   Encounter Diagnosis  Name Primary?   Trigger finger, right middle finger Yes    The procedure has been fully reviewed with the patient; The risks and benefits of surgery have been discussed and explained and understood. Alternative treatment has also been reviewed, questions were encouraged and answered. The postoperative plan is also been reviewed.   Release trigger finger right long/middle finger    Subjective: Chief Complaint  Patient presents with   trigger finger    Right long finger locking     HPI: 77 yo female referred to me by Dr. Hilda Lias for possible trigger finger release of the right long finger also known as the middle finger.  Patient complains of catching and locking for several weeks now.  She had successful surgery on the right thumb in the past and would like to have the surgery done instead of nonoperative treatment including injection              ROS: Currently negative   Images personally read and my interpretation : No imaging  Visit Diagnoses:  1. Trigger finger, right middle finger      Follow-Up Instructions: No follow-ups on file.    Objective: Vital Signs: BP (!) 148/75   Pulse 70   Ht 4\' 11"  (1.499 m)   Wt 140 lb (63.5 kg)   BMI 28.28 kg/m   Physical Exam Vitals and nursing note reviewed.  Constitutional:      Appearance: Normal appearance.  HENT:     Head: Normocephalic and atraumatic.  Eyes:     General: No scleral icterus.       Right eye: No discharge.        Left eye: No discharge.     Extraocular Movements: Extraocular movements intact.     Conjunctiva/sclera: Conjunctivae normal.     Pupils: Pupils are equal, round, and reactive to light.  Cardiovascular:      Rate and Rhythm: Normal rate.     Pulses: Normal pulses.  Skin:    General: Skin is warm and dry.     Capillary Refill: Capillary refill takes less than 2 seconds.  Neurological:     General: No focal deficit present.     Mental Status: She is alert and oriented to person, place, and time.  Psychiatric:        Mood and Affect: Mood normal.        Behavior: Behavior normal.        Thought Content: Thought content normal.        Judgment: Judgment normal.      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  We were able to reproduce the catching/locking of the right long finger.  There was tenderness over the A1 pulley there was slight decrease in extension at the PIP joint   Specialty Comments:  No specialty comments available.  Imaging: No results found.   PMFS History: Patient Active Problem List   Diagnosis Date Noted   Need for influenza vaccination 11/18/2022   Controlled type 2 diabetes  mellitus without complication, without long-term current use of insulin (HCC) 09/30/2022   Rotator cuff arthropathy of left shoulder 09/08/2022   Essential hypertension 09/03/2022   GERD (gastroesophageal reflux disease) 09/03/2022   Hyperlipidemia 09/03/2022   Osteoporosis 09/03/2022   Chronic musculoskeletal pain 09/03/2022   Rotator cuff arthropathy, left 09/03/2022   Screening for lung cancer 09/03/2022   Early satiety 02/22/2018   Loss of weight 02/22/2018   S/P trigger finger release right thumb 11/141/9 02/08/2018   Diarrhea 11/12/2016   Abdominal pain 11/12/2016   Nausea without vomiting 11/12/2016   Past Medical History:  Diagnosis Date   Fibrocystic breast    GERD (gastroesophageal reflux disease)    Hyperlipidemia    Hypertension    Osteoarthritis    Osteoporosis    PONV (postoperative nausea and vomiting)    Pre-diabetes    Shortness of breath dyspnea     Family  History  Problem Relation Age of Onset   Anesthesia problems Neg Hx    Hypotension Neg Hx    Malignant hyperthermia Neg Hx    Pseudochol deficiency Neg Hx    Colon cancer Neg Hx     Past Surgical History:  Procedure Laterality Date   ABDOMINAL HYSTERECTOMY     BIOPSY  04/15/2018   Procedure: BIOPSY;  Surgeon: Corbin Ade, MD;  [facility];  Service: Endoscopy;;  colon   BREAST CYST EXCISION Right    BREAST SURGERY     removal of cyst from right breast-fibrocystsic   BUNIONECTOMY  12/19/2010   Procedure: BUNIONECTOMY;  Surgeon: Dallas Schimke;  Location: [facility];  Service: Orthopedics;  Laterality: Right;  [name], [name] Bunionectomy   BUNIONECTOMY  05/22/2011   Procedure: Arbutus Leas;  Surgeon: Dallas Schimke, DPM;  Location: [facility];  Service: Orthopedics;  Laterality: Left;  [name] Bunionectomy Left Foot   CAPSULOTOMY  01/29/2012   Procedure: CAPSULOTOMY;  Surgeon: Dallas Schimke, DPM;  Location: [facility];  Service: Orthopedics;  Laterality: Left;   CATARACT EXTRACTION W/PHACO Right 08/09/2015   Procedure: CATARACT EXTRACTION PHACO AND INTRAOCULAR LENS PLACEMENT RIGHT EYE CDE=7.95;  Surgeon: Gemma Payor, MD;  Location: [facility];  Service: Ophthalmology;  Laterality: Right;   CATARACT EXTRACTION W/PHACO Left 08/27/2015   Procedure: CATARACT EXTRACTION PHACO AND INTRAOCULAR LENS PLACEMENT LEFT EYE; CDE:  6.12;  Surgeon: Gemma Payor, MD;  Location: [facility];  Service: Ophthalmology;  Laterality: Left;   COLONOSCOPY  2012   Dr. Jena Gauss: internal hemorrhoids, hyperplastic polyps. Surveillance 2022   COLONOSCOPY WITH PROPOFOL N/A 04/15/2018   one 7 mm polyp at splenic flexure, one 4 mm polyp in rectum, otherwise normal. S/p segmental biopsy:normal. Normal TI   DIAGNOSTIC LAPAROSCOPY     ESOPHAGOGASTRODUODENOSCOPY (EGD) WITH PROPOFOL N/A 04/15/2018   normal   FOOT GANGLION EXCISION     right foot   METATARSAL OSTEOTOMY  05/22/2011   Procedure:  METATARSAL OSTEOTOMY;  Surgeon: Dallas Schimke, DPM;  Location: [facility];  Service: Orthopedics;  Laterality: Left;  [name] Osteotomy Left Foot   POLYPECTOMY  04/15/2018   Procedure: POLYPECTOMY;  Surgeon: Corbin Ade, MD;  [facility];  Service: Endoscopy;;  colon   REVERSE SHOULDER ARTHROPLASTY Left 09/08/2022   Procedure: LEFT REVERSE SHOULDER ARTHROPLASTY;  Surgeon: Oliver Barre, MD;  Location: [facility];  Service: Orthopedics;  Laterality: Left;  RNFA NEEDED   TONSILLECTOMY  age 28   TRIGGER FINGER RELEASE Right 01/28/2018   Procedure: RIGHT TRIGGER THUMB RELEASE;  Surgeon:  Vickki Hearing, MD;  Location: [facility];  Service: Orthopedics;  Laterality: Right;   TUBAL LIGATION     Social History   Occupational History   Not on file  Tobacco Use   Smoking status: Former    Current packs/day: 0.00    Average packs/day: 1.5 packs/day for 47.4 years (71.0 ttl pk-yrs)    Types: Cigarettes    Start date: 03/18/1975    Quit date: 07/27/2022    Years since quitting: 0.5   Smokeless tobacco: Never  Substance and Sexual Activity   Alcohol use: No   Drug use: No   Sexual activity: Yes    Birth control/protection: Post-menopausal

## 2023-01-28 NOTE — Patient Instructions (Signed)
 Your surgery will be at [facility] by Dr Romeo Apple  The hospital will contact you with a preoperative appointment to discuss Anesthesia.  Please arrive on time or 15 minutes early for the preoperative appointment, they have a very tight schedule if you are late or do not come in your surgery will be cancelled.  The phone number is [phone]. Please bring your medications with you for the appointment. They will tell you the arrival time and medication instructions when you have your preoperative evaluation. Do not wear nail polish the day of your surgery and if you take Phentermine you need to stop this medication ONE WEEK prior to your surgery. If you take Docia Barrier, Jardiance, or Steglatro) - Hold 72 hours before the procedure.  If you take Ozempic,  Mounjaro, Bydureon or Trulicity do not take for 8 days before your surgery. If you take Victoza, Rybelsis, Saxenda or Adlyxi stop 24 hours before the procedure.  Please arrive at the hospital 2 hours before procedure if scheduled at 9:30 or later in the day or at the time the nurse tells you at your preoperative visit.   If you have my chart do not use the time given in my chart use the time given to you by the nurse during your preoperative visit.   Your surgery  time may change. Please be available for phone calls the day of your surgery and the day before. The Short Stay department may need to discuss changes about your surgery time. Not reaching the you could lead to procedure delays and possible cancellation.  You must have a ride home and someone to stay with you for 24 to 48 hours. The person taking you home will receive and sign for the your discharge instructions.  Please be prepared to give your support person's name and telephone number to Central Registration. Dr Romeo Apple will need that name and phone number post procedure.

## 2023-01-29 ENCOUNTER — Encounter (HOSPITAL_COMMUNITY): Payer: Self-pay | Admitting: Occupational Therapy

## 2023-01-29 ENCOUNTER — Ambulatory Visit (HOSPITAL_COMMUNITY): Payer: 59 | Admitting: Occupational Therapy

## 2023-01-29 DIAGNOSIS — R29898 Other symptoms and signs involving the musculoskeletal system: Secondary | ICD-10-CM | POA: Diagnosis not present

## 2023-01-29 DIAGNOSIS — M539 Dorsopathy, unspecified: Secondary | ICD-10-CM | POA: Diagnosis not present

## 2023-01-29 DIAGNOSIS — M25512 Pain in left shoulder: Secondary | ICD-10-CM

## 2023-01-29 DIAGNOSIS — M25612 Stiffness of left shoulder, not elsewhere classified: Secondary | ICD-10-CM | POA: Diagnosis not present

## 2023-01-29 DIAGNOSIS — Z79899 Other long term (current) drug therapy: Secondary | ICD-10-CM | POA: Diagnosis not present

## 2023-01-29 DIAGNOSIS — Z9181 History of falling: Secondary | ICD-10-CM | POA: Diagnosis not present

## 2023-01-29 NOTE — Addendum Note (Signed)
Addended byCaffie Damme on: 01/29/2023 09:53 AM   Modules accepted: Orders

## 2023-01-29 NOTE — Addendum Note (Signed)
Addended byCaffie Damme on: 01/29/2023 10:00 AM   Modules accepted: Orders

## 2023-01-29 NOTE — Therapy (Signed)
OUTPATIENT OCCUPATIONAL THERAPY ORTHO TREATMENT NOTE AND REASSESSMENT FOR RECERTIFICATION  Patient Name: Leah Lynch MRN: [id number] DOB:12-Sep-1945, 77 y.o., female Today's Date: 01/30/2023   END OF SESSION:  OT End of Session - 01/29/23 1230     Visit Number 21    Number of Visits 29    Date for OT Re-Evaluation 03/06/23    Authorization Type [employer] Medicare Dual Complete    Progress Note Due on Visit 25    OT Start Time 1151    OT Stop Time 1230    OT Time Calculation (min) 39 min    Activity Tolerance Patient tolerated treatment well    Behavior During Therapy WFL for tasks assessed/performed            Past Medical History:  Diagnosis Date   Fibrocystic breast    GERD (gastroesophageal reflux disease)    Hyperlipidemia    Hypertension    Osteoarthritis    Osteoporosis    PONV (postoperative nausea and vomiting)    Pre-diabetes    Shortness of breath dyspnea    Past Surgical History:  Procedure Laterality Date   ABDOMINAL HYSTERECTOMY     BIOPSY  04/15/2018   Procedure: BIOPSY;  Surgeon: Corbin Ade, MD;  [facility];  Service: Endoscopy;;  colon   BREAST CYST EXCISION Right    BREAST SURGERY     removal of cyst from right breast-fibrocystsic   BUNIONECTOMY  12/19/2010   Procedure: BUNIONECTOMY;  Surgeon: Dallas Schimke;  [facility]: [facility];  Service: Orthopedics;  Laterality: Right;  [name], [name] Bunionectomy   BUNIONECTOMY  05/22/2011   Procedure: Arbutus Leas;  Surgeon: Dallas Schimke, DPM;  [facility]: [facility];  Service: Orthopedics;  Laterality: Left;  [name] Bunionectomy Left Foot   CAPSULOTOMY  01/29/2012   Procedure: CAPSULOTOMY;  Surgeon: Dallas Schimke, DPM;  [facility];  Service: Orthopedics;  Laterality: Left;   CATARACT EXTRACTION W/PHACO Right 08/09/2015   Procedure: CATARACT EXTRACTION PHACO AND INTRAOCULAR LENS PLACEMENT RIGHT EYE CDE=7.95;  Surgeon: Gemma Payor, MD;  [facility]: [facility];   Service: Ophthalmology;  Laterality: Right;   CATARACT EXTRACTION W/PHACO Left 08/27/2015   Procedure: CATARACT EXTRACTION PHACO AND INTRAOCULAR LENS PLACEMENT LEFT EYE; CDE:  6.12;  Surgeon: Gemma Payor, MD;  [facility]: [facility];  Service: Ophthalmology;  Laterality: Left;   COLONOSCOPY  2012   Dr. Jena Gauss: internal hemorrhoids, hyperplastic polyps. Surveillance 2022   COLONOSCOPY WITH PROPOFOL N/A 04/15/2018   one 7 mm polyp at splenic flexure, one 4 mm polyp in rectum, otherwise normal. S/p segmental biopsy:normal. Normal TI   DIAGNOSTIC LAPAROSCOPY     ESOPHAGOGASTRODUODENOSCOPY (EGD) WITH PROPOFOL N/A 04/15/2018   normal   FOOT GANGLION EXCISION     right foot   METATARSAL OSTEOTOMY  05/22/2011   Procedure: METATARSAL OSTEOTOMY;  Surgeon: Dallas Schimke, DPM;  [facility]: [facility];  Service: Orthopedics;  Laterality: Left;  [name] Osteotomy Left Foot   POLYPECTOMY  04/15/2018   Procedure: POLYPECTOMY;  Surgeon: Corbin Ade, MD;  [facility];  Service: Endoscopy;;  colon   REVERSE SHOULDER ARTHROPLASTY Left 09/08/2022   Procedure: LEFT REVERSE SHOULDER ARTHROPLASTY;  Surgeon: Oliver Barre, MD;  [facility]: [facility];  Service: Orthopedics;  Laterality: Left;  RNFA NEEDED   TONSILLECTOMY  age 89   TRIGGER FINGER RELEASE Right 01/28/2018   Procedure: RIGHT TRIGGER THUMB RELEASE;  Surgeon: Vickki Hearing, MD;  Location: [facility];  Service: Orthopedics;  Laterality: Right;  TUBAL LIGATION     Patient Active Problem List   Diagnosis Date Noted   Need for influenza vaccination 11/18/2022   Controlled type 2 diabetes mellitus without complication, without long-term current use of insulin (HCC) 09/30/2022   Rotator cuff arthropathy of left shoulder 09/08/2022   Essential hypertension 09/03/2022   GERD (gastroesophageal reflux disease) 09/03/2022   Hyperlipidemia 09/03/2022   Osteoporosis 09/03/2022   Chronic musculoskeletal pain 09/03/2022   Rotator cuff arthropathy, left  09/03/2022   Screening for lung cancer 09/03/2022   Early satiety 02/22/2018   Loss of weight 02/22/2018   S/P trigger finger release right thumb 11/141/9 02/08/2018   Diarrhea 11/12/2016   Abdominal pain 11/12/2016   Nausea without vomiting 11/12/2016    PCP: Dr. Christel Mormon REFERRING PROVIDER: Dr. Thane Edu  ONSET DATE: 09/08/22  REFERRING DIAG: s/p left reverse TSA  THERAPY DIAG:  Stiffness of left shoulder, not elsewhere classified  Acute pain of left shoulder  Other symptoms and signs involving the musculoskeletal system  Rationale for Evaluation and Treatment: Rehabilitation  SUBJECTIVE:   SUBJECTIVE STATEMENT: S: I'm just achy today because of the weather  PERTINENT HISTORY: Pt is a 77 y/o female s/p left reverse TSA on 09/08/22. Pt received HH therapy for several weeks, stopped approximately 1 week ago.   PRECAUTIONS: Shoulder See Protocol  WEIGHT BEARING RESTRICTIONS: Yes NWB  PAIN:  Are you having pain? No   FALLS: Has patient fallen in last 6 months? Yes. Number of falls 1  PLOF: Independent  PATIENT GOALS: To be able to use the LUE.   NEXT MD VISIT: 03/03/23  OBJECTIVE:   HAND DOMINANCE: Right  ADLs: Overall ADLs: Pt reports she is having difficulty moving her arm for most tasks. Pt has difficulty with reaching up overhead and behind her back. She is able to dress herself, is unable to reach back behind her for pulling pants or underwear over hips. Pt is not lifting anything of weight, does not lift pots and pans. Pt is sleeping in the bed, on average sleeps ok, has difficulty when she is hurting.   FUNCTIONAL OUTCOME MEASURES: FOTO: 50/100 9/18: 56/100  UPPER EXTREMITY ROM:       Assessed in supine, er/IR adducted  Passive ROM Left eval Left 12/03/22  Shoulder flexion 120 141  Shoulder abduction 142 145  Shoulder internal rotation 90 90  Shoulder external rotation 35 45  (Blank rows = not tested)  Assessed in seated, er/IR  adducted  Active ROM Left eval Left 12/03/22 Left 12/16/22 Left  01/06/23 Left 01/29/23  Shoulder flexion 102 105 114 120 148  Shoulder abduction 76 112 112 109 112  Shoulder internal rotation 90 90 90 90 90  Shoulder external rotation 0 6 36 40 38  (Blank rows = not tested)    UPPER EXTREMITY MMT:     Assessed in sitting, er/IR adducted-via observation on eval  9/18: Assessed in sitting, er/IR adducted  MMT Left eval Left 12/03/22 Left 12/16/22 Left 01/06/23 Left 01/29/23  Shoulder flexion 3-/5 4-/5 4/5 4/5 4/5  Shoulder abduction 3-/5 4-/5 4/5 4/5 4+/5  Shoulder internal rotation 3/5 4/5 4+/5 5/5 5/5  Shoulder external rotation 3-/5 3-/5 4-/5 4/5 4+/5  (Blank rows = not tested)  EDEMA: occasional with weather changes  OBSERVATIONS: mod fascial restrictions along upper arm, anterior shoulder, and trapezius regions   TODAY'S TREATMENT:  DATE:   01/29/23 -Manual Therapy: myofascial release and trigger point applied to the bicep, deltoid, trapezius, scapular region and axillary region in order to reduce fascial restrictions and pain in order to improve ROM. -AA/ROM: seated, flexion, abduction, protraction, horizontal abduction, er/IR, x10 -A/ROM: seated-protraction, flexion, horizontal abduction, er, abduction, x15 -Proximal Strengthening Exercises: paddles, criss cross, circles both direction, x10 -Measurements for reassessment  01/26/23 -Manual Therapy: myofascial release and trigger point applied to the bicep, deltoid, trapezius, scapular region and axillary region in order to reduce fascial restrictions and pain in order to improve ROM. -A/ROM: seated-protraction, flexion, horizontal abduction, er, abduction, x15 -Wall slides: flexion, abduction, x10 -Loop Band Exercises: green band, wall taps, V ups, wall clock, x15  01/23/23 -Manual Therapy:  myofascial release and trigger point applied to the bicep, deltoid, trapezius, scapular region and axillary region in order to reduce fascial restrictions and pain in order to improve ROM. -A/ROM: seated-protraction, flexion, horizontal abduction, er, abduction, x15 -AA/ROM: seated, er, x15 -Loop Band Exercises: green band, wall taps, V ups, wall clock, x10 - mod difficulty   PATIENT EDUCATION: Education details: Review HEP Person educated: Patient Education method: Programmer, multimedia, Demonstration, and Handouts Education comprehension: verbalized understanding and returned demonstration  HOME EXERCISE PROGRAM: Eval: AA/ROM 9/4: A/ROM 9/10: Wall Slides 9/18: shoulder stretches 10/17: scapular strengthening-red band 11/1: weighted ROM with water bottles/cans  11/11: Loop Band Exercises  GOALS: Goals reviewed with patient? Yes   SHORT TERM GOALS: Target date: 12/05/22  Pt will be provided with and educated on HEP to improve mobility in LUE required for use during ADL completion.   Goal status: IN PROGRESS  2.  Pt will increase LUE A/ROM by 30+ degrees to improve ability to use LUE during dressing tasks with minimal compensatory techniques.   Goal status: IN PROGRESS  3.  Pt will increase LUE strength to 4/5 to improve ability to lift and move pots and pans during meal preparation and cleanup tasks.   Goal status: IN PROGRESS  4.  Pt will decrease pain in LUE to 3/10 or less to improve ability to sleep for 2+ consecutive hours without waking due to pain  Goal status: IN PROGRESS  5.  Pt will decrease LUE fascial restrictions to min amounts or less to improve mobility required for functional reaching tasks.      Goal status: IN PROGRESS   ASSESSMENT:  CLINICAL IMPRESSION: Pt completing reassessment this session for recertification. She is demonstrating improving ROM and strength, however limited to 65-70% of ROM. Due to increased pain and stiffness this session she completed  and focused on ROM to stretch into her end ranges and reduce pain. Verbal and tactile cuing provided intermittently for positioning and technique.   PERFORMANCE DEFICITS: in functional skills including in functional skills including ADLs, IADLs, coordination, tone, ROM, strength, pain, fascial restrictions, muscle spasms, and UE functional use   PLAN:  OT FREQUENCY: 2x/week  OT DURATION: 4 weeks  PLANNED INTERVENTIONS: self care/ADL training, therapeutic exercise, therapeutic activity, neuromuscular re-education, manual therapy, passive range of motion, splinting, electrical stimulation, ultrasound, moist heat, cryotherapy, patient/family education, and DME and/or AE instructions  CONSULTED AND AGREED WITH PLAN OF CARE: Patient  PLAN FOR NEXT SESSION: Follow up on HEP, shoulder strengthening exercises   Trish Mage, OTR/L [phone] 01/30/2023, 10:12 AM

## 2023-02-02 ENCOUNTER — Encounter (HOSPITAL_COMMUNITY): Payer: Self-pay | Admitting: Occupational Therapy

## 2023-02-02 ENCOUNTER — Ambulatory Visit (HOSPITAL_COMMUNITY): Payer: 59 | Admitting: Occupational Therapy

## 2023-02-02 DIAGNOSIS — M25512 Pain in left shoulder: Secondary | ICD-10-CM

## 2023-02-02 DIAGNOSIS — R29898 Other symptoms and signs involving the musculoskeletal system: Secondary | ICD-10-CM | POA: Diagnosis not present

## 2023-02-02 DIAGNOSIS — M25612 Stiffness of left shoulder, not elsewhere classified: Secondary | ICD-10-CM | POA: Diagnosis not present

## 2023-02-02 NOTE — Therapy (Signed)
OUTPATIENT OCCUPATIONAL THERAPY ORTHO TREATMENT NOTE AND REASSESSMENT FOR RECERTIFICATION  Patient Name: Leah Lynch MRN: [id number] DOB:05-08-1945, 77 y.o., female Today's Date: 02/02/2023   END OF SESSION:  OT End of Session - 02/02/23 1249     Visit Number 22    Number of Visits 29    Date for OT Re-Evaluation 03/06/23    Authorization Type [employer] Medicare Dual Complete    Progress Note Due on Visit 25    OT Start Time 1150    OT Stop Time 1230    OT Time Calculation (min) 40 min    Activity Tolerance Patient tolerated treatment well    Behavior During Therapy WFL for tasks assessed/performed             Past Medical History:  Diagnosis Date   Fibrocystic breast    GERD (gastroesophageal reflux disease)    Hyperlipidemia    Hypertension    Osteoarthritis    Osteoporosis    PONV (postoperative nausea and vomiting)    Pre-diabetes    Shortness of breath dyspnea    Past Surgical History:  Procedure Laterality Date   ABDOMINAL HYSTERECTOMY     BIOPSY  04/15/2018   Procedure: BIOPSY;  Surgeon: Corbin Ade, MD;  [facility]: [facility];  Service: Endoscopy;;  colon   BREAST CYST EXCISION Right    BREAST SURGERY     removal of cyst from right breast-fibrocystsic   BUNIONECTOMY  12/19/2010   Procedure: BUNIONECTOMY;  Surgeon: Dallas Schimke;  Location: [facility];  Service: Orthopedics;  Laterality: Right;  [name], [name] Bunionectomy   BUNIONECTOMY  05/22/2011   Procedure: Arbutus Leas;  Surgeon: Dallas Schimke, DPM;  Location: [facility];  Service: Orthopedics;  Laterality: Left;  [name] Bunionectomy Left Foot   CAPSULOTOMY  01/29/2012   Procedure: CAPSULOTOMY;  Surgeon: Dallas Schimke, DPM;  Location: [facility];  Service: Orthopedics;  Laterality: Left;   CATARACT EXTRACTION W/PHACO Right 08/09/2015   Procedure: CATARACT EXTRACTION PHACO AND INTRAOCULAR LENS PLACEMENT RIGHT EYE CDE=7.95;  Surgeon: Gemma Payor, MD;  Location: [facility];   Service: Ophthalmology;  Laterality: Right;   CATARACT EXTRACTION W/PHACO Left 08/27/2015   Procedure: CATARACT EXTRACTION PHACO AND INTRAOCULAR LENS PLACEMENT LEFT EYE; CDE:  6.12;  Surgeon: Gemma Payor, MD;  Location: [facility];  Service: Ophthalmology;  Laterality: Left;   COLONOSCOPY  2012   Dr. Jena Gauss: internal hemorrhoids, hyperplastic polyps. Surveillance 2022   COLONOSCOPY WITH PROPOFOL N/A 04/15/2018   one 7 mm polyp at splenic flexure, one 4 mm polyp in rectum, otherwise normal. S/p segmental biopsy:normal. Normal TI   DIAGNOSTIC LAPAROSCOPY     ESOPHAGOGASTRODUODENOSCOPY (EGD) WITH PROPOFOL N/A 04/15/2018   normal   FOOT GANGLION EXCISION     right foot   METATARSAL OSTEOTOMY  05/22/2011   Procedure: METATARSAL OSTEOTOMY;  Surgeon: Dallas Schimke, DPM;  Location: [facility];  Service: Orthopedics;  Laterality: Left;  [name] Osteotomy Left Foot   POLYPECTOMY  04/15/2018   Procedure: POLYPECTOMY;  Surgeon: Corbin Ade, MD;  [facility]: [facility];  Service: Endoscopy;;  colon   REVERSE SHOULDER ARTHROPLASTY Left 09/08/2022   Procedure: LEFT REVERSE SHOULDER ARTHROPLASTY;  Surgeon: Oliver Barre, MD;  Location: [facility];  Service: Orthopedics;  Laterality: Left;  RNFA NEEDED   TONSILLECTOMY  age 40   TRIGGER FINGER RELEASE Right 01/28/2018   Procedure: RIGHT TRIGGER THUMB RELEASE;  Surgeon: Vickki Hearing, MD;  Location: [facility];  Service: Orthopedics;  Laterality:  Right;   TUBAL LIGATION     Patient Active Problem List   Diagnosis Date Noted   Need for influenza vaccination 11/18/2022   Controlled type 2 diabetes mellitus without complication, without long-term current use of insulin (HCC) 09/30/2022   Rotator cuff arthropathy of left shoulder 09/08/2022   Essential hypertension 09/03/2022   GERD (gastroesophageal reflux disease) 09/03/2022   Hyperlipidemia 09/03/2022   Osteoporosis 09/03/2022   Chronic musculoskeletal pain 09/03/2022   Rotator cuff arthropathy, left  09/03/2022   Screening for lung cancer 09/03/2022   Early satiety 02/22/2018   Loss of weight 02/22/2018   S/P trigger finger release right thumb 11/141/9 02/08/2018   Diarrhea 11/12/2016   Abdominal pain 11/12/2016   Nausea without vomiting 11/12/2016    PCP: Dr. Christel Mormon REFERRING PROVIDER: Dr. Thane Edu  ONSET DATE: 09/08/22  REFERRING DIAG: s/p left reverse TSA  THERAPY DIAG:  Stiffness of left shoulder, not elsewhere classified  Acute pain of left shoulder  Other symptoms and signs involving the musculoskeletal system  Rationale for Evaluation and Treatment: Rehabilitation  SUBJECTIVE:   SUBJECTIVE STATEMENT: S: I'm just achy today because of the weather  PERTINENT HISTORY: Pt is a 77 y/o female s/p left reverse TSA on 09/08/22. Pt received HH therapy for several weeks, stopped approximately 1 week ago.   PRECAUTIONS: Shoulder See Protocol  WEIGHT BEARING RESTRICTIONS: Yes NWB  PAIN:  Are you having pain? No   FALLS: Has patient fallen in last 6 months? Yes. Number of falls 1  PLOF: Independent  PATIENT GOALS: To be able to use the LUE.   NEXT MD VISIT: 03/03/23  OBJECTIVE:   HAND DOMINANCE: Right  ADLs: Overall ADLs: Pt reports she is having difficulty moving her arm for most tasks. Pt has difficulty with reaching up overhead and behind her back. She is able to dress herself, is unable to reach back behind her for pulling pants or underwear over hips. Pt is not lifting anything of weight, does not lift pots and pans. Pt is sleeping in the bed, on average sleeps ok, has difficulty when she is hurting.   FUNCTIONAL OUTCOME MEASURES: FOTO: 50/100 9/18: 56/100  UPPER EXTREMITY ROM:       Assessed in supine, er/IR adducted  Passive ROM Left eval Left 12/03/22  Shoulder flexion 120 141  Shoulder abduction 142 145  Shoulder internal rotation 90 90  Shoulder external rotation 35 45  (Blank rows = not tested)  Assessed in seated, er/IR  adducted  Active ROM Left eval Left 12/03/22 Left 12/16/22 Left  01/06/23 Left 01/29/23  Shoulder flexion 102 105 114 120 148  Shoulder abduction 76 112 112 109 112  Shoulder internal rotation 90 90 90 90 90  Shoulder external rotation 0 6 36 40 38  (Blank rows = not tested)    UPPER EXTREMITY MMT:     Assessed in sitting, er/IR adducted-via observation on eval  9/18: Assessed in sitting, er/IR adducted  MMT Left eval Left 12/03/22 Left 12/16/22 Left 01/06/23 Left 01/29/23  Shoulder flexion 3-/5 4-/5 4/5 4/5 4/5  Shoulder abduction 3-/5 4-/5 4/5 4/5 4+/5  Shoulder internal rotation 3/5 4/5 4+/5 5/5 5/5  Shoulder external rotation 3-/5 3-/5 4-/5 4/5 4+/5  (Blank rows = not tested)  EDEMA: occasional with weather changes  OBSERVATIONS: mod fascial restrictions along upper arm, anterior shoulder, and trapezius regions   TODAY'S TREATMENT:  DATE:   02/02/23 -Manual Therapy: myofascial release and trigger point applied to the bicep, deltoid, trapezius, scapular region and axillary region in order to reduce fascial restrictions and pain in order to improve ROM. -A/ROM: seated-protraction, flexion, horizontal abduction, er, abduction, x15 -Shoulder strengthening: 2#, flexion, abduction, protraction, horizontal abduction, er/Ir, x10 -wall slides: flexion, abduction, x10  01/29/23 -Manual Therapy: myofascial release and trigger point applied to the bicep, deltoid, trapezius, scapular region and axillary region in order to reduce fascial restrictions and pain in order to improve ROM. -AA/ROM: seated, flexion, abduction, protraction, horizontal abduction, er/IR, x10 -A/ROM: seated-protraction, flexion, horizontal abduction, er, abduction, x15 -Proximal Strengthening Exercises: paddles, criss cross, circles both direction, x10 -Measurements for  reassessment  01/26/23 -Manual Therapy: myofascial release and trigger point applied to the bicep, deltoid, trapezius, scapular region and axillary region in order to reduce fascial restrictions and pain in order to improve ROM. -A/ROM: seated-protraction, flexion, horizontal abduction, er, abduction, x15 -Wall slides: flexion, abduction, x10 -Loop Band Exercises: green band, wall taps, V ups, wall clock, x15   PATIENT EDUCATION: Education details: Review HEP Person educated: Patient Education method: Programmer, multimedia, Demonstration, and Handouts Education comprehension: verbalized understanding and returned demonstration  HOME EXERCISE PROGRAM: Eval: AA/ROM 9/4: A/ROM 9/10: Wall Slides 9/18: shoulder stretches 10/17: scapular strengthening-red band 11/1: weighted ROM with water bottles/cans  11/11: Loop Band Exercises  GOALS: Goals reviewed with patient? Yes   SHORT TERM GOALS: Target date: 12/05/22  Pt will be provided with and educated on HEP to improve mobility in LUE required for use during ADL completion.   Goal status: IN PROGRESS  2.  Pt will increase LUE A/ROM by 30+ degrees to improve ability to use LUE during dressing tasks with minimal compensatory techniques.   Goal status: IN PROGRESS  3.  Pt will increase LUE strength to 4/5 to improve ability to lift and move pots and pans during meal preparation and cleanup tasks.   Goal status: IN PROGRESS  4.  Pt will decrease pain in LUE to 3/10 or less to improve ability to sleep for 2+ consecutive hours without waking due to pain  Goal status: IN PROGRESS  5.  Pt will decrease LUE fascial restrictions to min amounts or less to improve mobility required for functional reaching tasks.      Goal status: IN PROGRESS   ASSESSMENT:  CLINICAL IMPRESSION: Pt continuing to have increased stiffness, which is increasing pain as well with all mobility. OT had pt work with 2# weights this session in reclined position to both  strengthen and stretch into end ranges. She continues to reach approximately 65-70% of full ROM actively after completing a few motions first. Verbal and tactile cuing provided for positioning and technique, intermittently throughout session.   PERFORMANCE DEFICITS: in functional skills including in functional skills including ADLs, IADLs, coordination, tone, ROM, strength, pain, fascial restrictions, muscle spasms, and UE functional use   PLAN:  OT FREQUENCY: 2x/week  OT DURATION: 4 weeks  PLANNED INTERVENTIONS: self care/ADL training, therapeutic exercise, therapeutic activity, neuromuscular re-education, manual therapy, passive range of motion, splinting, electrical stimulation, ultrasound, moist heat, cryotherapy, patient/family education, and DME and/or AE instructions  CONSULTED AND AGREED WITH PLAN OF CARE: Patient  PLAN FOR NEXT SESSION: Follow up on HEP, shoulder strengthening exercises   Trish Mage, OTR/L [phone] 02/02/2023, 12:50 PM

## 2023-02-05 ENCOUNTER — Ambulatory Visit (HOSPITAL_COMMUNITY): Payer: 59 | Admitting: Occupational Therapy

## 2023-02-05 DIAGNOSIS — M25512 Pain in left shoulder: Secondary | ICD-10-CM | POA: Diagnosis not present

## 2023-02-05 DIAGNOSIS — M25612 Stiffness of left shoulder, not elsewhere classified: Secondary | ICD-10-CM

## 2023-02-05 DIAGNOSIS — R29898 Other symptoms and signs involving the musculoskeletal system: Secondary | ICD-10-CM

## 2023-02-05 NOTE — Therapy (Signed)
OUTPATIENT OCCUPATIONAL THERAPY ORTHO TREATMENT NOTE  Patient Name: Leah Lynch MRN: [id number] DOB:1945-05-19, 77 y.o., female Today's Date: 02/05/2023   END OF SESSION:   02/05/23 1345  OT Visits / Re-Eval  Visit Number 23  Number of Visits 29  Date for OT Re-Evaluation 03/06/23  Authorization  Authorization Type [employer] Medicare Dual Complete  Progress Note Due on Visit 25  OT Time Calculation  OT Start Time 1307  OT Stop Time 1345  OT Time Calculation (min) 38 min  End of Session  Activity Tolerance Patient tolerated treatment well  Behavior During Therapy WFL for tasks assessed/performed     Past Medical History:  Diagnosis Date   Fibrocystic breast    GERD (gastroesophageal reflux disease)    Hyperlipidemia    Hypertension    Osteoarthritis    Osteoporosis    PONV (postoperative nausea and vomiting)    Pre-diabetes    Shortness of breath dyspnea    Past Surgical History:  Procedure Laterality Date   ABDOMINAL HYSTERECTOMY     BIOPSY  04/15/2018   Procedure: BIOPSY;  Surgeon: Corbin Ade, MD;  [facility]: [facility];  Service: Endoscopy;;  colon   BREAST CYST EXCISION Right    BREAST SURGERY     removal of cyst from right breast-fibrocystsic   BUNIONECTOMY  12/19/2010   Procedure: BUNIONECTOMY;  Surgeon: Dallas Schimke;  Location: [facility];  Service: Orthopedics;  Laterality: Right;  [name], [name] Bunionectomy   BUNIONECTOMY  05/22/2011   Procedure: Arbutus Leas;  Surgeon: Dallas Schimke, DPM;  Location: [facility];  Service: Orthopedics;  Laterality: Left;  [name] Bunionectomy Left Foot   CAPSULOTOMY  01/29/2012   Procedure: CAPSULOTOMY;  Surgeon: Dallas Schimke, DPM;  Location: [facility];  Service: Orthopedics;  Laterality: Left;   CATARACT EXTRACTION W/PHACO Right 08/09/2015   Procedure: CATARACT EXTRACTION PHACO AND INTRAOCULAR LENS PLACEMENT RIGHT EYE CDE=7.95;  Surgeon: Gemma Payor, MD;  Location: [facility];  Service:  Ophthalmology;  Laterality: Right;   CATARACT EXTRACTION W/PHACO Left 08/27/2015   Procedure: CATARACT EXTRACTION PHACO AND INTRAOCULAR LENS PLACEMENT LEFT EYE; CDE:  6.12;  Surgeon: Gemma Payor, MD;  Location: [facility];  Service: Ophthalmology;  Laterality: Left;   COLONOSCOPY  2012   Dr. Jena Gauss: internal hemorrhoids, hyperplastic polyps. Surveillance 2022   COLONOSCOPY WITH PROPOFOL N/A 04/15/2018   one 7 mm polyp at splenic flexure, one 4 mm polyp in rectum, otherwise normal. S/p segmental biopsy:normal. Normal TI   DIAGNOSTIC LAPAROSCOPY     ESOPHAGOGASTRODUODENOSCOPY (EGD) WITH PROPOFOL N/A 04/15/2018   normal   FOOT GANGLION EXCISION     right foot   METATARSAL OSTEOTOMY  05/22/2011   Procedure: METATARSAL OSTEOTOMY;  Surgeon: Dallas Schimke, DPM;  Location: [facility];  Service: Orthopedics;  Laterality: Left;  [name] Osteotomy Left Foot   POLYPECTOMY  04/15/2018   Procedure: POLYPECTOMY;  Surgeon: Corbin Ade, MD;  [facility];  Service: Endoscopy;;  colon   REVERSE SHOULDER ARTHROPLASTY Left 09/08/2022   Procedure: LEFT REVERSE SHOULDER ARTHROPLASTY;  Surgeon: Oliver Barre, MD;  Location: [facility];  Service: Orthopedics;  Laterality: Left;  RNFA NEEDED   TONSILLECTOMY  age 36   TRIGGER FINGER RELEASE Right 01/28/2018   Procedure: RIGHT TRIGGER THUMB RELEASE;  Surgeon: Vickki Hearing, MD;  Location: [facility];  Service: Orthopedics;  Laterality: Right;   TUBAL LIGATION     Patient Active Problem List   Diagnosis Date Noted   Need for  influenza vaccination 11/18/2022   Controlled type 2 diabetes mellitus without complication, without long-term current use of insulin (HCC) 09/30/2022   Rotator cuff arthropathy of left shoulder 09/08/2022   Essential hypertension 09/03/2022   GERD (gastroesophageal reflux disease) 09/03/2022   Hyperlipidemia 09/03/2022   Osteoporosis 09/03/2022   Chronic musculoskeletal pain 09/03/2022   Rotator cuff arthropathy, left 09/03/2022    Screening for lung cancer 09/03/2022   Early satiety 02/22/2018   Loss of weight 02/22/2018   S/P trigger finger release right thumb 11/141/9 02/08/2018   Diarrhea 11/12/2016   Abdominal pain 11/12/2016   Nausea without vomiting 11/12/2016    PCP: Dr. Christel Mormon REFERRING PROVIDER: Dr. Thane Edu  ONSET DATE: 09/08/22  REFERRING DIAG: s/p left reverse TSA  THERAPY DIAG:  No diagnosis found.  Rationale for Evaluation and Treatment: Rehabilitation  SUBJECTIVE:   SUBJECTIVE STATEMENT: S: I'm just achy today because of the weather  PERTINENT HISTORY: Pt is a 77 y/o female s/p left reverse TSA on 09/08/22. Pt received HH therapy for several weeks, stopped approximately 1 week ago.   PRECAUTIONS: Shoulder See Protocol  WEIGHT BEARING RESTRICTIONS: Yes NWB  PAIN:  Are you having pain? No   FALLS: Has patient fallen in last 6 months? Yes. Number of falls 1  PLOF: Independent  PATIENT GOALS: To be able to use the LUE.   NEXT MD VISIT: 03/03/23  OBJECTIVE:   HAND DOMINANCE: Right  ADLs: Overall ADLs: Pt reports she is having difficulty moving her arm for most tasks. Pt has difficulty with reaching up overhead and behind her back. She is able to dress herself, is unable to reach back behind her for pulling pants or underwear over hips. Pt is not lifting anything of weight, does not lift pots and pans. Pt is sleeping in the bed, on average sleeps ok, has difficulty when she is hurting.   FUNCTIONAL OUTCOME MEASURES: FOTO: 50/100 9/18: 56/100  UPPER EXTREMITY ROM:       Assessed in supine, er/IR adducted  Passive ROM Left eval Left 12/03/22  Shoulder flexion 120 141  Shoulder abduction 142 145  Shoulder internal rotation 90 90  Shoulder external rotation 35 45  (Blank rows = not tested)  Assessed in seated, er/IR adducted  Active ROM Left eval Left 12/03/22 Left 12/16/22 Left  01/06/23 Left 01/29/23  Shoulder flexion 102 105 114 120 148  Shoulder  abduction 76 112 112 109 112  Shoulder internal rotation 90 90 90 90 90  Shoulder external rotation 0 6 36 40 38  (Blank rows = not tested)    UPPER EXTREMITY MMT:     Assessed in sitting, er/IR adducted-via observation on eval  9/18: Assessed in sitting, er/IR adducted  MMT Left eval Left 12/03/22 Left 12/16/22 Left 01/06/23 Left 01/29/23  Shoulder flexion 3-/5 4-/5 4/5 4/5 4/5  Shoulder abduction 3-/5 4-/5 4/5 4/5 4+/5  Shoulder internal rotation 3/5 4/5 4+/5 5/5 5/5  Shoulder external rotation 3-/5 3-/5 4-/5 4/5 4+/5  (Blank rows = not tested)  EDEMA: occasional with weather changes  OBSERVATIONS: mod fascial restrictions along upper arm, anterior shoulder, and trapezius regions   TODAY'S TREATMENT:  DATE:   02/05/23 UBE: level 1, 2.5' forwards and backwards -Pulleys: flexion, abduction, x60" -A/ROM: seated, flexion, abduction, protraction, horizontal abduction, er/IR, x12 -Ball Roll on the Wall: flexion, x10 -Therapy ball exercises: basketball, flexion, protraction, overhead press, V ups, circles both directions, x10 -Scapular strengthening: Green band, extension, retraction, rows, x1  02/02/23 -Manual Therapy: myofascial release and trigger point applied to the bicep, deltoid, trapezius, scapular region and axillary region in order to reduce fascial restrictions and pain in order to improve ROM. -A/ROM: seated-protraction, flexion, horizontal abduction, er, abduction, x15 -Shoulder strengthening: 2#, flexion, abduction, protraction, horizontal abduction, er/Ir, x10 -wall slides: flexion, abduction, x10  01/29/23 -Manual Therapy: myofascial release and trigger point applied to the bicep, deltoid, trapezius, scapular region and axillary region in order to reduce fascial restrictions and pain in order to improve ROM. -AA/ROM: seated, flexion,  abduction, protraction, horizontal abduction, er/IR, x10 -A/ROM: seated-protraction, flexion, horizontal abduction, er, abduction, x15 -Proximal Strengthening Exercises: paddles, criss cross, circles both direction, x10 -Measurements for reassessment   PATIENT EDUCATION: Education details: Review HEP Person educated: Patient Education method: Programmer, multimedia, Demonstration, and Handouts Education comprehension: verbalized understanding and returned demonstration  HOME EXERCISE PROGRAM: Eval: AA/ROM 9/4: A/ROM 9/10: Wall Slides 9/18: shoulder stretches 10/17: scapular strengthening-red band 11/1: weighted ROM with water bottles/cans  11/11: Loop Band Exercises  GOALS: Goals reviewed with patient? Yes   SHORT TERM GOALS: Target date: 12/05/22  Pt will be provided with and educated on HEP to improve mobility in LUE required for use during ADL completion.   Goal status: IN PROGRESS  2.  Pt will increase LUE A/ROM by 30+ degrees to improve ability to use LUE during dressing tasks with minimal compensatory techniques.   Goal status: IN PROGRESS  3.  Pt will increase LUE strength to 4/5 to improve ability to lift and move pots and pans during meal preparation and cleanup tasks.   Goal status: IN PROGRESS  4.  Pt will decrease pain in LUE to 3/10 or less to improve ability to sleep for 2+ consecutive hours without waking due to pain  Goal status: IN PROGRESS  5.  Pt will decrease LUE fascial restrictions to min amounts or less to improve mobility required for functional reaching tasks.      Goal status: IN PROGRESS   ASSESSMENT:  CLINICAL IMPRESSION: This session pt continues to have increased stiffness and limited ROM, however warming up with the UBE and pulleys appears to have improved her mobility. She was able to tolerate increased strengthening exercises this session, including resistance exercises and lifting exercises. OT providing verbal and tactile cuing for positioning  and technique throughout all exercises.   PERFORMANCE DEFICITS: in functional skills including in functional skills including ADLs, IADLs, coordination, tone, ROM, strength, pain, fascial restrictions, muscle spasms, and UE functional use   PLAN:  OT FREQUENCY: 2x/week  OT DURATION: 4 weeks  PLANNED INTERVENTIONS: self care/ADL training, therapeutic exercise, therapeutic activity, neuromuscular re-education, manual therapy, passive range of motion, splinting, electrical stimulation, ultrasound, moist heat, cryotherapy, patient/family education, and DME and/or AE instructions  CONSULTED AND AGREED WITH PLAN OF CARE: Patient  PLAN FOR NEXT SESSION: Follow up on HEP, shoulder strengthening exercises   Trish Mage, OTR/L [phone] 02/05/2023, 1:17 PM

## 2023-02-10 ENCOUNTER — Encounter (HOSPITAL_COMMUNITY): Payer: Self-pay | Admitting: Occupational Therapy

## 2023-02-10 ENCOUNTER — Ambulatory Visit (HOSPITAL_COMMUNITY): Payer: 59 | Admitting: Occupational Therapy

## 2023-02-10 DIAGNOSIS — M25512 Pain in left shoulder: Secondary | ICD-10-CM

## 2023-02-10 DIAGNOSIS — R29898 Other symptoms and signs involving the musculoskeletal system: Secondary | ICD-10-CM | POA: Diagnosis not present

## 2023-02-10 DIAGNOSIS — M25612 Stiffness of left shoulder, not elsewhere classified: Secondary | ICD-10-CM

## 2023-02-10 NOTE — Patient Instructions (Addendum)
Leah Lynch  02/10/2023     @PREFPERIOPPHARMACY @   Your procedure is scheduled on  02/17/2023.   Report to [facility] at  1000 A.M.   Call this number if you have problems the morning of surgery:  [phone]  If you experience any cold or flu symptoms such as cough, fever, chills, shortness of breath, etc. between now and your scheduled surgery, please notify us at the above number.   Remember:  Do not eat after midnight.   You may drink clear liquids until 0800 am on 02/17/2023.    Clear liquids allowed are:                    Water, Juice (No red color; non-citric and without pulp; diabetics please choose diet or no sugar options), Carbonated beverages (diabetics please choose diet or no sugar options), Clear Tea (No creamer, milk, or cream, including half & half and powdered creamer), Black Coffee Only (No creamer, milk or cream, including half & half and powdered creamer), and Clear Sports drink (No red color; diabetics please choose diet or no sugar options)    Take these medicines the morning of surgery with A SIP OF WATER      atenolol, gabapentin, hydrocodone(if needed), omeprazole.     Do not wear jewelry, make-up or nail polish, including gel polish,  artificial nails, or any other type of covering on natural nails (fingers and  toes).  Do not wear lotions, powders, or perfumes, or deodorant.  Do not shave 48 hours prior to surgery.  Men may shave face and neck.  Do not bring valuables to the hospital.  [facility] is not responsible for any belongings or valuables.  Contacts, dentures or bridgework may not be worn into surgery.  Leave your suitcase in the car.  After surgery it may be brought to your room.  For patients admitted to the hospital, discharge time will be determined by your treatment team.  Patients discharged the day of surgery will not be allowed to drive home and must have someone with them for 24 hours.    Special instructions:    DO NOT smoke tobacco or vape for 24 hours before your procedure.  Please read over the following fact sheets that you were given. Surgical Site Infection Prevention, Anesthesia Post-op Instructions, and Care and Recovery After Surgery        Trigger Finger Release, Care After After trigger finger release, it is common to have: Stiffness. Soreness. Swelling. Follow these instructions at home: Incision care  Keep the compression bandage on for 48 hours or as told by your health care provider. After removing it, follow instructions about how to take care of your incision. Make sure you: Wash your hands with soap and water for at least 20 seconds before and after you change your bandage (dressing). If soap and water are not available, use hand sanitizer. Change your dressing as told by your provider. Leave stitches (sutures), skin glue, or tape strips in place. These skin closures may need to stay in place for 2 weeks or longer. If tape strip edges start to loosen and curl up, you may trim the loose edges. Do not remove tape strips completely unless your provider tells you to do that. Keep your hand and dressing clean and dry. Check your incision area every day for signs of infection. Check for: More redness, swelling, or pain. Fluid or blood. Warmth. Pus or a  bad smell. Bathing Do not take baths, swim, or use a hot tub until your provider approves. Keep your dressing dry until your provider says it can be removed. Cover it with a watertight covering when you take a bath or a shower. Managing pain, stiffness, and swelling  If told, put ice on your palm. Put ice in a plastic bag. Place a towel between your skin and the bag. Leave the ice on for 20 minutes, 2-3 times a day. If your skin turns bright red, remove the ice right away to prevent skin damage. The risk of damage is higher if you cannot feel pain, heat, or cold. Move your fingers often to reduce stiffness and  swelling. Raise (elevate) your hand above the level of your heart while you are sitting or lying down. Activity If you were given a sedative during the procedure, it can affect you for several hours. Do not drive or operate machinery until your provider says that it is safe. You may have to avoid lifting. Ask your provider how much you can safely lift. Avoid any activity that causes pain. It may take 4-6 months for stiffness to go away. Return to your normal activities as told by your provider. Ask your provider what activities are safe for you. If hand therapy was prescribed, do exercises as told. This will help you regain movement. General instructions Take over-the-counter and prescription medicines only as told by your provider. Do not use any products that contain nicotine or tobacco. These products include cigarettes, chewing tobacco, and vaping devices, such as e-cigarettes. If you need help quitting, ask your provider. Keep all follow-up visits. If you have sutures, these will be removed in about 10-14 days. Your health care provider may give you more instructions. Make sure you know what you can and cannot do. Contact a health care provider if: You have chills or fever. You have any signs of infection. You are unable to move your finger because of pain or stiffness. You have any tingling or numbness in your hand or fingers. This information is not intended to replace advice given to you by your health care provider. Make sure you discuss any questions you have with your health care provider. Document Revised: 10/15/2021 Document Reviewed: 10/15/2021 Elsevier Patient Education  2024 [employer] General Anesthesia, Adult, Care After The following information offers guidance on how to care for yourself after your procedure. Your health care provider may also give you more specific instructions. If you have problems or questions, contact your health care provider. What can I expect  after the procedure? After the procedure, it is common for people to: Have pain or discomfort at the IV site. Have nausea or vomiting. Have a sore throat or hoarseness. Have trouble concentrating. Feel cold or chills. Feel weak, sleepy, or tired (fatigue). Have soreness and body aches. These can affect parts of the body that were not involved in surgery. Follow these instructions at home: For the time period you were told by your health care provider:  Rest. Do not participate in activities where you could fall or become injured. Do not drive or use machinery. Do not drink alcohol. Do not take sleeping pills or medicines that cause drowsiness. Do not make important decisions or sign legal documents. Do not take care of children on your own. General instructions Drink enough fluid to keep your urine pale yellow. If you have sleep apnea, surgery and certain medicines can increase your risk for breathing problems. Follow instructions from  your health care provider about wearing your sleep device: Anytime you are sleeping, including during daytime naps. While taking prescription pain medicines, sleeping medicines, or medicines that make you drowsy. Return to your normal activities as told by your health care provider. Ask your health care provider what activities are safe for you. Take over-the-counter and prescription medicines only as told by your health care provider. Do not use any products that contain nicotine or tobacco. These products include cigarettes, chewing tobacco, and vaping devices, such as e-cigarettes. These can delay incision healing after surgery. If you need help quitting, ask your health care provider. Contact a health care provider if: You have nausea or vomiting that does not get better with medicine. You vomit every time you eat or drink. You have pain that does not get better with medicine. You cannot urinate or have bloody urine. You develop a skin rash. You  have a fever. Get help right away if: You have trouble breathing. You have chest pain. You vomit blood. These symptoms may be an emergency. Get help right away. Call 911. Do not wait to see if the symptoms will go away. Do not drive yourself to the hospital. Summary After the procedure, it is common to have a sore throat, hoarseness, nausea, vomiting, or to feel weak, sleepy, or fatigue. For the time period you were told by your health care provider, do not drive or use machinery. Get help right away if you have difficulty breathing, have chest pain, or vomit blood. These symptoms may be an emergency. This information is not intended to replace advice given to you by your health care provider. Make sure you discuss any questions you have with your health care provider. Document Revised: 05/31/2021 Document Reviewed: 05/31/2021 Elsevier Patient Education  2024 [employer] How to Use Chlorhexidine Before Surgery Chlorhexidine gluconate (CHG) is a germ-killing (antiseptic) solution that is used to clean the skin. It can get rid of the bacteria that normally live on the skin and can keep them away for about 24 hours. To clean your skin with CHG, you may be given: A CHG solution to use in the shower or as part of a sponge bath. A prepackaged cloth that contains CHG. Cleaning your skin with CHG may help lower the risk for infection: While you are staying in the intensive care unit of the hospital. If you have a vascular access, such as a central line, to provide short-term or long-term access to your veins. If you have a catheter to drain urine from your bladder. If you are on a ventilator. A ventilator is a machine that helps you breathe by moving air in and out of your lungs. After surgery. What are the risks? Risks of using CHG include: A skin reaction. Hearing loss, if CHG gets in your ears and you have a perforated eardrum. Eye injury, if CHG gets in your eyes and is not rinsed  out. The CHG product catching fire. Make sure that you avoid smoking and flames after applying CHG to your skin. Do not use CHG: If you have a chlorhexidine allergy or have previously reacted to chlorhexidine. On babies younger than 72 months of age. How to use CHG solution Use CHG only as told by your health care provider, and follow the instructions on the label. Use the full amount of CHG as directed. Usually, this is one bottle. During a shower Follow these steps when using CHG solution during a shower (unless your health care provider gives you  different instructions): Start the shower. Use your normal soap and shampoo to wash your face and hair. Turn off the shower or move out of the shower stream. Pour the CHG onto a clean washcloth. Do not use any type of brush or rough-edged sponge. Starting at your neck, lather your body down to your toes. Make sure you follow these instructions: If you will be having surgery, pay special attention to the part of your body where you will be having surgery. Scrub this area for at least 1 minute. Do not use CHG on your head or face. If the solution gets into your ears or eyes, rinse them well with water. Avoid your genital area. Avoid any areas of skin that have broken skin, cuts, or scrapes. Scrub your back and under your arms. Make sure to wash skin folds. Let the lather sit on your skin for 1-2 minutes or as long as told by your health care provider. Thoroughly rinse your entire body in the shower. Make sure that all body creases and crevices are rinsed well. Dry off with a clean towel. Do not put any substances on your body afterward--such as powder, lotion, or perfume--unless you are told to do so by your health care provider. Only use lotions that are recommended by the manufacturer. Put on clean clothes or pajamas. If it is the night before your surgery, sleep in clean sheets.  During a sponge bath Follow these steps when using CHG solution  during a sponge bath (unless your health care provider gives you different instructions): Use your normal soap and shampoo to wash your face and hair. Pour the CHG onto a clean washcloth. Starting at your neck, lather your body down to your toes. Make sure you follow these instructions: If you will be having surgery, pay special attention to the part of your body where you will be having surgery. Scrub this area for at least 1 minute. Do not use CHG on your head or face. If the solution gets into your ears or eyes, rinse them well with water. Avoid your genital area. Avoid any areas of skin that have broken skin, cuts, or scrapes. Scrub your back and under your arms. Make sure to wash skin folds. Let the lather sit on your skin for 1-2 minutes or as long as told by your health care provider. Using a different clean, wet washcloth, thoroughly rinse your entire body. Make sure that all body creases and crevices are rinsed well. Dry off with a clean towel. Do not put any substances on your body afterward--such as powder, lotion, or perfume--unless you are told to do so by your health care provider. Only use lotions that are recommended by the manufacturer. Put on clean clothes or pajamas. If it is the night before your surgery, sleep in clean sheets. How to use CHG prepackaged cloths Only use CHG cloths as told by your health care provider, and follow the instructions on the label. Use the CHG cloth on clean, dry skin. Do not use the CHG cloth on your head or face unless your health care provider tells you to. When washing with the CHG cloth: Avoid your genital area. Avoid any areas of skin that have broken skin, cuts, or scrapes. Before surgery Follow these steps when using a CHG cloth to clean before surgery (unless your health care provider gives you different instructions): Using the CHG cloth, vigorously scrub the part of your body where you will be having surgery. Scrub using a  back-and-forth motion for 3 minutes. The area on your body should be completely wet with CHG when you are done scrubbing. Do not rinse. Discard the cloth and let the area air-dry. Do not put any substances on the area afterward, such as powder, lotion, or perfume. Put on clean clothes or pajamas. If it is the night before your surgery, sleep in clean sheets.  For general bathing Follow these steps when using CHG cloths for general bathing (unless your health care provider gives you different instructions). Use a separate CHG cloth for each area of your body. Make sure you wash between any folds of skin and between your fingers and toes. Wash your body in the following order, switching to a new cloth after each step: The front of your neck, shoulders, and chest. Both of your arms, under your arms, and your hands. Your stomach and groin area, avoiding the genitals. Your right leg and foot. Your left leg and foot. The back of your neck, your back, and your buttocks. Do not rinse. Discard the cloth and let the area air-dry. Do not put any substances on your body afterward--such as powder, lotion, or perfume--unless you are told to do so by your health care provider. Only use lotions that are recommended by the manufacturer. Put on clean clothes or pajamas. Contact a health care provider if: Your skin gets irritated after scrubbing. You have questions about using your solution or cloth. You swallow any chlorhexidine. Call your local poison control center (319-565-9927 in the [place] Get help right away if: Your eyes itch badly, or they become very red or swollen. Your skin itches badly and is red or swollen. Your hearing changes. You have trouble seeing. You have swelling or tingling in your mouth or throat. You have trouble breathing. These symptoms may represent a serious problem that is an emergency. Do not wait to see if the symptoms will go away. Get medical help right away. Call your  local emergency services (911 in the [place] Do not drive yourself to the hospital. Summary Chlorhexidine gluconate (CHG) is a germ-killing (antiseptic) solution that is used to clean the skin. Cleaning your skin with CHG may help to lower your risk for infection. You may be given CHG to use for bathing. It may be in a bottle or in a prepackaged cloth to use on your skin. Carefully follow your health care provider's instructions and the instructions on the product label. Do not use CHG if you have a chlorhexidine allergy. Contact your health care provider if your skin gets irritated after scrubbing. This information is not intended to replace advice given to you by your health care provider. Make sure you discuss any questions you have with your health care provider. Document Revised: 07/01/2021 Document Reviewed: 05/14/2020 Elsevier Patient Education  2023 [employer].

## 2023-02-10 NOTE — Therapy (Signed)
OUTPATIENT OCCUPATIONAL THERAPY ORTHO TREATMENT NOTE  Patient Name: Leah Lynch MRN: [id number] DOB:03/24/1945, 77 y.o., female Today's Date: 02/10/2023   END OF SESSION:  OT End of Session - 02/10/23 1435     Visit Number 24    Number of Visits 29    Date for OT Re-Evaluation 03/06/23    Authorization Type [employer] Medicare Dual Complete    Progress Note Due on Visit 25    OT Start Time 1355    OT Stop Time 1435    OT Time Calculation (min) 40 min    Activity Tolerance Patient tolerated treatment well    Behavior During Therapy WFL for tasks assessed/performed             Past Medical History:  Diagnosis Date   Fibrocystic breast    GERD (gastroesophageal reflux disease)    Hyperlipidemia    Hypertension    Osteoarthritis    Osteoporosis    PONV (postoperative nausea and vomiting)    Pre-diabetes    Shortness of breath dyspnea    Past Surgical History:  Procedure Laterality Date   ABDOMINAL HYSTERECTOMY     BIOPSY  04/15/2018   Procedure: BIOPSY;  Surgeon: Corbin Ade, MD;  [facility]: [facility];  Service: Endoscopy;;  colon   BREAST CYST EXCISION Right    BREAST SURGERY     removal of cyst from right breast-fibrocystsic   BUNIONECTOMY  12/19/2010   Procedure: BUNIONECTOMY;  Surgeon: Dallas Schimke;  Location: [facility];  Service: Orthopedics;  Laterality: Right;  [name], [name] Bunionectomy   BUNIONECTOMY  05/22/2011   Procedure: Arbutus Leas;  Surgeon: Dallas Schimke, DPM;  Location: [facility];  Service: Orthopedics;  Laterality: Left;  [name] Bunionectomy Left Foot   CAPSULOTOMY  01/29/2012   Procedure: CAPSULOTOMY;  Surgeon: Dallas Schimke, DPM;  Location: [facility];  Service: Orthopedics;  Laterality: Left;   CATARACT EXTRACTION W/PHACO Right 08/09/2015   Procedure: CATARACT EXTRACTION PHACO AND INTRAOCULAR LENS PLACEMENT RIGHT EYE CDE=7.95;  Surgeon: Gemma Payor, MD;  Location: [facility];  Service: Ophthalmology;   Laterality: Right;   CATARACT EXTRACTION W/PHACO Left 08/27/2015   Procedure: CATARACT EXTRACTION PHACO AND INTRAOCULAR LENS PLACEMENT LEFT EYE; CDE:  6.12;  Surgeon: Gemma Payor, MD;  Location: [facility];  Service: Ophthalmology;  Laterality: Left;   COLONOSCOPY  2012   Dr. Jena Gauss: internal hemorrhoids, hyperplastic polyps. Surveillance 2022   COLONOSCOPY WITH PROPOFOL N/A 04/15/2018   one 7 mm polyp at splenic flexure, one 4 mm polyp in rectum, otherwise normal. S/p segmental biopsy:normal. Normal TI   DIAGNOSTIC LAPAROSCOPY     ESOPHAGOGASTRODUODENOSCOPY (EGD) WITH PROPOFOL N/A 04/15/2018   normal   FOOT GANGLION EXCISION     right foot   METATARSAL OSTEOTOMY  05/22/2011   Procedure: METATARSAL OSTEOTOMY;  Surgeon: Dallas Schimke, DPM;  Location: [facility];  Service: Orthopedics;  Laterality: Left;  [name] Osteotomy Left Foot   POLYPECTOMY  04/15/2018   Procedure: POLYPECTOMY;  Surgeon: Corbin Ade, MD;  [facility]: [facility];  Service: Endoscopy;;  colon   REVERSE SHOULDER ARTHROPLASTY Left 09/08/2022   Procedure: LEFT REVERSE SHOULDER ARTHROPLASTY;  Surgeon: Oliver Barre, MD;  Location: [facility];  Service: Orthopedics;  Laterality: Left;  RNFA NEEDED   TONSILLECTOMY  age 69   TRIGGER FINGER RELEASE Right 01/28/2018   Procedure: RIGHT TRIGGER THUMB RELEASE;  Surgeon: Vickki Hearing, MD;  Location: [facility];  Service: Orthopedics;  Laterality: Right;   TUBAL  LIGATION     Patient Active Problem List   Diagnosis Date Noted   Need for influenza vaccination 11/18/2022   Controlled type 2 diabetes mellitus without complication, without long-term current use of insulin (HCC) 09/30/2022   Rotator cuff arthropathy of left shoulder 09/08/2022   Essential hypertension 09/03/2022   GERD (gastroesophageal reflux disease) 09/03/2022   Hyperlipidemia 09/03/2022   Osteoporosis 09/03/2022   Chronic musculoskeletal pain 09/03/2022   Rotator cuff arthropathy, left 09/03/2022   Screening for  lung cancer 09/03/2022   Early satiety 02/22/2018   Loss of weight 02/22/2018   S/P trigger finger release right thumb 11/141/9 02/08/2018   Diarrhea 11/12/2016   Abdominal pain 11/12/2016   Nausea without vomiting 11/12/2016    PCP: Dr. Christel Mormon REFERRING PROVIDER: Dr. Thane Edu  ONSET DATE: 09/08/22  REFERRING DIAG: s/p left reverse TSA  THERAPY DIAG:  Stiffness of left shoulder, not elsewhere classified  Other symptoms and signs involving the musculoskeletal system  Acute pain of left shoulder  Rationale for Evaluation and Treatment: Rehabilitation  SUBJECTIVE:   SUBJECTIVE STATEMENT: S: My arm isn't so bad today.  PERTINENT HISTORY: Pt is a 77 y/o female s/p left reverse TSA on 09/08/22. Pt received HH therapy for several weeks, stopped approximately 1 week ago.   PRECAUTIONS: Shoulder See Protocol  WEIGHT BEARING RESTRICTIONS: Yes NWB  PAIN:  Are you having pain? No   FALLS: Has patient fallen in last 6 months? Yes. Number of falls 1  PLOF: Independent  PATIENT GOALS: To be able to use the LUE.   NEXT MD VISIT: 03/03/23  OBJECTIVE:   HAND DOMINANCE: Right  ADLs: Overall ADLs: Pt reports she is having difficulty moving her arm for most tasks. Pt has difficulty with reaching up overhead and behind her back. She is able to dress herself, is unable to reach back behind her for pulling pants or underwear over hips. Pt is not lifting anything of weight, does not lift pots and pans. Pt is sleeping in the bed, on average sleeps ok, has difficulty when she is hurting.   FUNCTIONAL OUTCOME MEASURES: FOTO: 50/100 9/18: 56/100  UPPER EXTREMITY ROM:       Assessed in supine, er/IR adducted  Passive ROM Left eval Left 12/03/22  Shoulder flexion 120 141  Shoulder abduction 142 145  Shoulder internal rotation 90 90  Shoulder external rotation 35 45  (Blank rows = not tested)  Assessed in seated, er/IR adducted  Active ROM Left eval Left 12/03/22  Left 12/16/22 Left  01/06/23 Left 01/29/23  Shoulder flexion 102 105 114 120 148  Shoulder abduction 76 112 112 109 112  Shoulder internal rotation 90 90 90 90 90  Shoulder external rotation 0 6 36 40 38  (Blank rows = not tested)    UPPER EXTREMITY MMT:     Assessed in sitting, er/IR adducted-via observation on eval  9/18: Assessed in sitting, er/IR adducted  MMT Left eval Left 12/03/22 Left 12/16/22 Left 01/06/23 Left 01/29/23  Shoulder flexion 3-/5 4-/5 4/5 4/5 4/5  Shoulder abduction 3-/5 4-/5 4/5 4/5 4+/5  Shoulder internal rotation 3/5 4/5 4+/5 5/5 5/5  Shoulder external rotation 3-/5 3-/5 4-/5 4/5 4+/5  (Blank rows = not tested)  EDEMA: occasional with weather changes  OBSERVATIONS: mod fascial restrictions along upper arm, anterior shoulder, and trapezius regions   TODAY'S TREATMENT:  DATE:   02/10/23 UBE: level 1, 2.5' forwards and backwards -Pulleys: flexion, abduction, x60" -A/ROM: seated, flexion, abduction, protraction, horizontal abduction, er/IR, x12 -Scapular Strengthening: red band, extension, retraction, rows, x15 -Shoulder Strengthening: red band, horizontal abduction, er, IR, abduction, flexion, x15  02/05/23 UBE: level 1, 2.5' forwards and backwards -Pulleys: flexion, abduction, x60" -A/ROM: seated, flexion, abduction, protraction, horizontal abduction, er/IR, x12 -Ball Roll on the Wall: flexion, x10 -Therapy ball exercises: basketball, flexion, protraction, overhead press, V ups, circles both directions, x10 -Scapular strengthening: Green band, extension, retraction, rows, x1  02/02/23 -Manual Therapy: myofascial release and trigger point applied to the bicep, deltoid, trapezius, scapular region and axillary region in order to reduce fascial restrictions and pain in order to improve ROM. -A/ROM: seated-protraction,  flexion, horizontal abduction, er, abduction, x15 -Shoulder strengthening: 2#, flexion, abduction, protraction, horizontal abduction, er/Ir, x10 -wall slides: flexion, abduction, x10   PATIENT EDUCATION: Education details: Review HEP Person educated: Patient Education method: Programmer, multimedia, Demonstration, and Handouts Education comprehension: verbalized understanding and returned demonstration  HOME EXERCISE PROGRAM: Eval: AA/ROM 9/4: A/ROM 9/10: Wall Slides 9/18: shoulder stretches 10/17: scapular strengthening-red band 11/1: weighted ROM with water bottles/cans  11/11: Loop Band Exercises  GOALS: Goals reviewed with patient? Yes   SHORT TERM GOALS: Target date: 12/05/22  Pt will be provided with and educated on HEP to improve mobility in LUE required for use during ADL completion.   Goal status: IN PROGRESS  2.  Pt will increase LUE A/ROM by 30+ degrees to improve ability to use LUE during dressing tasks with minimal compensatory techniques.   Goal status: IN PROGRESS  3.  Pt will increase LUE strength to 4/5 to improve ability to lift and move pots and pans during meal preparation and cleanup tasks.   Goal status: IN PROGRESS  4.  Pt will decrease pain in LUE to 3/10 or less to improve ability to sleep for 2+ consecutive hours without waking due to pain  Goal status: IN PROGRESS  5.  Pt will decrease LUE fascial restrictions to min amounts or less to improve mobility required for functional reaching tasks.      Goal status: IN PROGRESS   ASSESSMENT:  CLINICAL IMPRESSION: Pt continues to have difficulty with A/ROM, however she was able to achieve 65-70% of full ROM in sitting this session after warming up on the UBE and Pulleys. Pt and OT were able to add shoulder strengthening and continue with scapular strengthening. Overall pt remaining minimal pain throughout session. Verbal and tactile cuing for positioning and technique.   PERFORMANCE DEFICITS: in functional  skills including in functional skills including ADLs, IADLs, coordination, tone, ROM, strength, pain, fascial restrictions, muscle spasms, and UE functional use   PLAN:  OT FREQUENCY: 2x/week  OT DURATION: 4 weeks  PLANNED INTERVENTIONS: self care/ADL training, therapeutic exercise, therapeutic activity, neuromuscular re-education, manual therapy, passive range of motion, splinting, electrical stimulation, ultrasound, moist heat, cryotherapy, patient/family education, and DME and/or AE instructions  CONSULTED AND AGREED WITH PLAN OF CARE: Patient  PLAN FOR NEXT SESSION: Follow up on HEP, shoulder strengthening exercises   Trish Mage, OTR/L [phone] 02/10/2023, 2:48 PM

## 2023-02-11 ENCOUNTER — Encounter (HOSPITAL_COMMUNITY): Payer: Self-pay

## 2023-02-11 ENCOUNTER — Other Ambulatory Visit: Payer: Self-pay

## 2023-02-11 ENCOUNTER — Encounter (HOSPITAL_COMMUNITY)
Admission: RE | Admit: 2023-02-11 | Discharge: 2023-02-11 | Disposition: A | Discharge status: Home / Self Care | Payer: 59 | Source: Ambulatory Visit | Attending: Orthopedic Surgery | Admitting: Orthopedic Surgery

## 2023-02-11 VITALS — BP 148/75 | HR 70 | Temp 97.8°F | Resp 18 | Ht 59.0 in | Wt 140.0 lb

## 2023-02-11 DIAGNOSIS — E119 Type 2 diabetes mellitus without complications: Secondary | ICD-10-CM | POA: Diagnosis not present

## 2023-02-11 DIAGNOSIS — M65331 Trigger finger, right middle finger: Secondary | ICD-10-CM | POA: Insufficient documentation

## 2023-02-11 DIAGNOSIS — Z01812 Encounter for preprocedural laboratory examination: Secondary | ICD-10-CM | POA: Insufficient documentation

## 2023-02-11 DIAGNOSIS — Z01818 Encounter for other preprocedural examination: Secondary | ICD-10-CM

## 2023-02-11 LAB — CBC WITH DIFFERENTIAL/PLATELET
Abs Immature Granulocytes: 0.01 10*3/uL (ref 0.00–0.07)
Basophils Absolute: 0 10*3/uL (ref 0.0–0.1)
Basophils Relative: 0 %
Eosinophils Absolute: 0.4 10*3/uL (ref 0.0–0.5)
Eosinophils Relative: 5 %
HCT: 38.7 % (ref 36.0–46.0)
Hemoglobin: 12.1 g/dL (ref 12.0–15.0)
Immature Granulocytes: 0 %
Lymphocytes Relative: 39 %
Lymphs Abs: 3.1 10*3/uL (ref 0.7–4.0)
MCH: 27.9 pg (ref 26.0–34.0)
MCHC: 31.3 g/dL (ref 30.0–36.0)
MCV: 89.2 fL (ref 80.0–100.0)
Monocytes Absolute: 0.5 10*3/uL (ref 0.1–1.0)
Monocytes Relative: 7 %
Neutro Abs: 4 10*3/uL (ref 1.7–7.7)
Neutrophils Relative %: 49 %
Platelets: 258 10*3/uL (ref 150–400)
RBC: 4.34 MIL/uL (ref 3.87–5.11)
RDW: 15.7 % — ABNORMAL HIGH (ref 11.5–15.5)
WBC: 8.1 10*3/uL (ref 4.0–10.5)
nRBC: 0 % (ref 0.0–0.2)

## 2023-02-11 LAB — BASIC METABOLIC PANEL
Anion gap: 9 (ref 5–15)
BUN: 22 mg/dL (ref 8–23)
CO2: 29 mmol/L (ref 22–32)
Calcium: 9.1 mg/dL (ref 8.9–10.3)
Chloride: 106 mmol/L (ref 98–111)
Creatinine, Ser: 0.81 mg/dL (ref 0.44–1.00)
GFR, Estimated: >60 mL/min (ref >60)
Glucose, Bld: 92 mg/dL (ref 70–99)
Potassium: 3.9 mmol/L (ref 3.5–5.1)
Sodium: 144 mmol/L (ref 135–145)

## 2023-02-11 LAB — HEMOGLOBIN A1C
Hgb A1c MFr Bld: 6.6 % — ABNORMAL HIGH (ref 4.8–5.6)
Mean Plasma Glucose: 142.72 mg/dL

## 2023-02-16 ENCOUNTER — Encounter (HOSPITAL_COMMUNITY): Payer: Self-pay | Admitting: Occupational Therapy

## 2023-02-16 ENCOUNTER — Ambulatory Visit (HOSPITAL_COMMUNITY): Payer: 59 | Attending: Orthopedic Surgery | Admitting: Occupational Therapy

## 2023-02-16 DIAGNOSIS — M25512 Pain in left shoulder: Secondary | ICD-10-CM | POA: Diagnosis not present

## 2023-02-16 DIAGNOSIS — R29898 Other symptoms and signs involving the musculoskeletal system: Secondary | ICD-10-CM | POA: Diagnosis not present

## 2023-02-16 DIAGNOSIS — M25612 Stiffness of left shoulder, not elsewhere classified: Secondary | ICD-10-CM | POA: Insufficient documentation

## 2023-02-16 NOTE — Therapy (Unsigned)
OUTPATIENT OCCUPATIONAL THERAPY ORTHO TREATMENT NOTE  Patient Name: Leah Lynch MRN: [id number] DOB:Aug 30, 1945, 77 y.o., female Today's Date: 02/17/2023   END OF SESSION:  OT End of Session - 02/16/23 1232     Visit Number 25    Number of Visits 29    Date for OT Re-Evaluation 03/06/23    Authorization Type [employer] Medicare Dual Complete    Progress Note Due on Visit 25    OT Start Time 1149    OT Stop Time 1232    OT Time Calculation (min) 43 min    Activity Tolerance Patient tolerated treatment well    Behavior During Therapy WFL for tasks assessed/performed              Past Medical History:  Diagnosis Date   Fibrocystic breast    GERD (gastroesophageal reflux disease)    Hyperlipidemia    Hypertension    Osteoarthritis    Osteoporosis    PONV (postoperative nausea and vomiting)    Pre-diabetes    Shortness of breath dyspnea    Past Surgical History:  Procedure Laterality Date   ABDOMINAL HYSTERECTOMY     BIOPSY  04/15/2018   Procedure: BIOPSY;  Surgeon: Corbin Ade, MD;  [facility];  Service: Endoscopy;;  colon   BREAST CYST EXCISION Right    BREAST SURGERY     removal of cyst from right breast-fibrocystsic   BUNIONECTOMY  12/19/2010   Procedure: BUNIONECTOMY;  Surgeon: Dallas Schimke;  Location: [facility];  Service: Orthopedics;  Laterality: Right;  [name], [name] Bunionectomy   BUNIONECTOMY  05/22/2011   Procedure: Arbutus Leas;  Surgeon: Dallas Schimke, DPM;  Location: [facility];  Service: Orthopedics;  Laterality: Left;  [name] Bunionectomy Left Foot   CAPSULOTOMY  01/29/2012   Procedure: CAPSULOTOMY;  Surgeon: Dallas Schimke, DPM;  Location: [facility];  Service: Orthopedics;  Laterality: Left;   CATARACT EXTRACTION W/PHACO Right 08/09/2015   Procedure: CATARACT EXTRACTION PHACO AND INTRAOCULAR LENS PLACEMENT RIGHT EYE CDE=7.95;  Surgeon: Gemma Payor, MD;  Location: [facility];  Service: Ophthalmology;   Laterality: Right;   CATARACT EXTRACTION W/PHACO Left 08/27/2015   Procedure: CATARACT EXTRACTION PHACO AND INTRAOCULAR LENS PLACEMENT LEFT EYE; CDE:  6.12;  Surgeon: Gemma Payor, MD;  Location: [facility];  Service: Ophthalmology;  Laterality: Left;   COLONOSCOPY  2012   Dr. Jena Gauss: internal hemorrhoids, hyperplastic polyps. Surveillance 2022   COLONOSCOPY WITH PROPOFOL N/A 04/15/2018   one 7 mm polyp at splenic flexure, one 4 mm polyp in rectum, otherwise normal. S/p segmental biopsy:normal. Normal TI   DIAGNOSTIC LAPAROSCOPY     ESOPHAGOGASTRODUODENOSCOPY (EGD) WITH PROPOFOL N/A 04/15/2018   normal   FOOT GANGLION EXCISION     right foot   METATARSAL OSTEOTOMY  05/22/2011   Procedure: METATARSAL OSTEOTOMY;  Surgeon: Dallas Schimke, DPM;  Location: [facility];  Service: Orthopedics;  Laterality: Left;  [name] Osteotomy Left Foot   POLYPECTOMY  04/15/2018   Procedure: POLYPECTOMY;  Surgeon: Corbin Ade, MD;  [facility];  Service: Endoscopy;;  colon   REVERSE SHOULDER ARTHROPLASTY Left 09/08/2022   Procedure: LEFT REVERSE SHOULDER ARTHROPLASTY;  Surgeon: Oliver Barre, MD;  Location: [facility];  Service: Orthopedics;  Laterality: Left;  RNFA NEEDED   TONSILLECTOMY  age 70   TRIGGER FINGER RELEASE Right 01/28/2018   Procedure: RIGHT TRIGGER THUMB RELEASE;  Surgeon: Vickki Hearing, MD;  Location: [facility];  Service: Orthopedics;  Laterality: Right;  TUBAL LIGATION     Patient Active Problem List   Diagnosis Date Noted   Need for influenza vaccination 11/18/2022   Controlled type 2 diabetes mellitus without complication, without long-term current use of insulin (HCC) 09/30/2022   Rotator cuff arthropathy of left shoulder 09/08/2022   Essential hypertension 09/03/2022   GERD (gastroesophageal reflux disease) 09/03/2022   Hyperlipidemia 09/03/2022   Osteoporosis 09/03/2022   Chronic musculoskeletal pain 09/03/2022   Rotator cuff arthropathy, left 09/03/2022   Screening for  lung cancer 09/03/2022   Early satiety 02/22/2018   Loss of weight 02/22/2018   S/P trigger finger release right thumb 11/141/9 02/08/2018   Diarrhea 11/12/2016   Abdominal pain 11/12/2016   Nausea without vomiting 11/12/2016    PCP: Dr. Christel Mormon REFERRING PROVIDER: Dr. Thane Edu  ONSET DATE: 09/08/22  REFERRING DIAG: s/p left reverse TSA  THERAPY DIAG:  Stiffness of left shoulder, not elsewhere classified  Other symptoms and signs involving the musculoskeletal system  Acute pain of left shoulder  Rationale for Evaluation and Treatment: Rehabilitation  SUBJECTIVE:   SUBJECTIVE STATEMENT: S: I didn't take anything for pain today before coming in.  PERTINENT HISTORY: Pt is a 77 y/o female s/p left reverse TSA on 09/08/22. Pt received HH therapy for several weeks, stopped approximately 1 week ago.   PRECAUTIONS: Shoulder See Protocol  WEIGHT BEARING RESTRICTIONS: Yes >5lbs  PAIN:  Are you having pain? No   FALLS: Has patient fallen in last 6 months? Yes. Number of falls 1  PLOF: Independent  PATIENT GOALS: To be able to use the LUE.   NEXT MD VISIT: 03/03/23  OBJECTIVE:   HAND DOMINANCE: Right  ADLs: Overall ADLs: Pt reports she is having difficulty moving her arm for most tasks. Pt has difficulty with reaching up overhead and behind her back. She is able to dress herself, is unable to reach back behind her for pulling pants or underwear over hips. Pt is not lifting anything of weight, does not lift pots and pans. Pt is sleeping in the bed, on average sleeps ok, has difficulty when she is hurting.   FUNCTIONAL OUTCOME MEASURES: FOTO: 50/100 9/18: 56/100  UPPER EXTREMITY ROM:       Assessed in supine, er/IR adducted  Passive ROM Left eval Left 12/03/22  Shoulder flexion 120 141  Shoulder abduction 142 145  Shoulder internal rotation 90 90  Shoulder external rotation 35 45  (Blank rows = not tested)  Assessed in seated, er/IR adducted  Active  ROM Left eval Left 12/03/22 Left 12/16/22 Left  01/06/23 Left 01/29/23  Shoulder flexion 102 105 114 120 148  Shoulder abduction 76 112 112 109 112  Shoulder internal rotation 90 90 90 90 90  Shoulder external rotation 0 6 36 40 38  (Blank rows = not tested)    UPPER EXTREMITY MMT:     Assessed in sitting, er/IR adducted-via observation on eval  9/18: Assessed in sitting, er/IR adducted  MMT Left eval Left 12/03/22 Left 12/16/22 Left 01/06/23 Left 01/29/23  Shoulder flexion 3-/5 4-/5 4/5 4/5 4/5  Shoulder abduction 3-/5 4-/5 4/5 4/5 4+/5  Shoulder internal rotation 3/5 4/5 4+/5 5/5 5/5  Shoulder external rotation 3-/5 3-/5 4-/5 4/5 4+/5  (Blank rows = not tested)  EDEMA: occasional with weather changes  OBSERVATIONS: mod fascial restrictions along upper arm, anterior shoulder, and trapezius regions   TODAY'S TREATMENT:  DATE:   02/16/23 -Manual Therapy: myofascial release and trigger point applied to the bicep, deltoid, trapezius, scapular region and axillary region in order to reduce fascial restrictions and pain in order to improve ROM. -A/ROM: side lying, flexion, abduction, protraction, horizontal abduction, er/IR, x12 -Shoulder Strengthening: side lying, 2# weight,  flexion, abduction, protraction, horizontal abduction, er/IR, x10 -A/ROM: standing, flexion, abduction, protraction, horizontal abduction, er/IR, x12  02/10/23 -UBE: level 1, 2.5' forwards and backwards -Pulleys: flexion, abduction, x60" -A/ROM: seated, flexion, abduction, protraction, horizontal abduction, er/IR, x12 -Scapular Strengthening: red band, extension, retraction, rows, x15 -Shoulder Strengthening: red band, horizontal abduction, er, IR, abduction, flexion, x15  02/05/23 UBE: level 1, 2.5' forwards and backwards -Pulleys: flexion, abduction, x60" -A/ROM: seated,  flexion, abduction, protraction, horizontal abduction, er/IR, x12 -Ball Roll on the Wall: flexion, x10 -Therapy ball exercises: basketball, flexion, protraction, overhead press, V ups, circles both directions, x10 -Scapular strengthening: Green band, extension, retraction, rows, x1   PATIENT EDUCATION: Education details: Review HEP Person educated: Patient Education method: Programmer, multimedia, Demonstration, and Handouts Education comprehension: verbalized understanding and returned demonstration  HOME EXERCISE PROGRAM: Eval: AA/ROM 9/4: A/ROM 9/10: Wall Slides 9/18: shoulder stretches 10/17: scapular strengthening-red band 11/1: weighted ROM with water bottles/cans  11/11: Loop Band Exercises  GOALS: Goals reviewed with patient? Yes   SHORT TERM GOALS: Target date: 12/05/22  Pt will be provided with and educated on HEP to improve mobility in LUE required for use during ADL completion.   Goal status: IN PROGRESS  2.  Pt will increase LUE A/ROM by 30+ degrees to improve ability to use LUE during dressing tasks with minimal compensatory techniques.   Goal status: IN PROGRESS  3.  Pt will increase LUE strength to 4/5 to improve ability to lift and move pots and pans during meal preparation and cleanup tasks.   Goal status: IN PROGRESS  4.  Pt will decrease pain in LUE to 3/10 or less to improve ability to sleep for 2+ consecutive hours without waking due to pain  Goal status: IN PROGRESS  5.  Pt will decrease LUE fascial restrictions to min amounts or less to improve mobility required for functional reaching tasks.      Goal status: IN PROGRESS   ASSESSMENT:  CLINICAL IMPRESSION: This session, OT restarted manual therapy, as pt complained of increased stiffness and pain. She then worked on ROM in side lying to limit gravitational pull, which allowed pt to reach approximately 80% of full ROM. She was able to tolerate 2# weight in side lying, continuing to reach 80% of full ROM.  Once in sitting, pt again was only able to achieve 60-65% of full ROM, with pt noting weakness in her anterior portion of the bicep tendon/muscle. Verbal and tactile cuing provided for positioning and technique throughout session.   PERFORMANCE DEFICITS: in functional skills including in functional skills including ADLs, IADLs, coordination, tone, ROM, strength, pain, fascial restrictions, muscle spasms, and UE functional use   PLAN:  OT FREQUENCY: 2x/week  OT DURATION: 4 weeks  PLANNED INTERVENTIONS: self care/ADL training, therapeutic exercise, therapeutic activity, neuromuscular re-education, manual therapy, passive range of motion, splinting, electrical stimulation, ultrasound, moist heat, cryotherapy, patient/family education, and DME and/or AE instructions  CONSULTED AND AGREED WITH PLAN OF CARE: Patient  PLAN FOR NEXT SESSION: Follow up on HEP, shoulder strengthening exercises   Trish Mage, OTR/L [phone] 02/17/2023, 12:00 PM

## 2023-02-16 NOTE — H&P (Addendum)
02/16/2023  6:13 PM   Outpatient history and physical for surgery right long/middle finger  Leah Lynch has opted for no nonoperative care she does not think it works and would like to proceed immediately with surgery.   Assessment & Plan:        Encounter Diagnosis  Name Primary?   Trigger finger, right middle finger Yes      The procedure has been fully reviewed with the patient; The risks and benefits of surgery have been discussed and explained and understood. Alternative treatment has also been reviewed, questions were encouraged and answered. The postoperative plan is also been reviewed.    Release trigger finger right long/middle finger      Subjective:     Chief Complaint  Patient presents with   trigger finger      Right long finger locking       HPI: 77 yo female referred to me by Dr. Hilda Lias for possible trigger finger release of the right long finger also known as the middle finger.  Patient complains of catching and locking for several weeks now.  She had successful surgery on the right thumb in the past and would like to have the surgery done instead of nonoperative treatment including injection                                                                     ROS: Currently negative     Images personally read and my interpretation : No imaging   Visit Diagnoses:  1. Trigger finger, right middle finger         Follow-Up Instructions: No follow-ups on file.      Objective: Vital Signs: BP (!) 148/75   Pulse 70   Ht 4\' 11"  (1.499 m)   Wt 140 lb (63.5 kg)   BMI 28.28 kg/m    Physical Exam Vitals and nursing note reviewed.  Constitutional:      Appearance: Normal appearance.  HENT:     Head: Normocephalic and atraumatic.  Eyes:     General: No scleral icterus.       Right eye: No discharge.        Left eye: No discharge.     Extraocular Movements: Extraocular movements intact.     Conjunctiva/sclera: Conjunctivae normal.     Pupils:  Pupils are equal, round, and reactive to light.  Cardiovascular:     Rate and Rhythm: Normal rate.     Pulses: Normal pulses.  Skin:    General: Skin is warm and dry.     Capillary Refill: Capillary refill takes less than 2 seconds.  Neurological:     General: No focal deficit present.     Mental Status: She is alert and oriented to person, place, and time.  Psychiatric:        Mood and Affect: Mood normal.        Behavior: Behavior normal.        Thought Content: Thought content normal.        Judgment: Judgment normal.          Ortho Exam   Right hand exhibits several areas of degenerative arthritis of the interphalangeal joints otherwise full range of motion.  We were able to reproduce  the catching/locking of the right long finger.  There was tenderness over the A1 pulley there was slight decrease in extension at the PIP joint     Specialty Comments:  No specialty comments available.   Imaging: No results found.     PMFS History:     Patient Active Problem List    Diagnosis Date Noted   Need for influenza vaccination 11/18/2022   Controlled type 2 diabetes mellitus without complication, without long-term current use of insulin (HCC) 09/30/2022   Rotator cuff arthropathy of left shoulder 09/08/2022   Essential hypertension 09/03/2022   GERD (gastroesophageal reflux disease) 09/03/2022   Hyperlipidemia 09/03/2022   Osteoporosis 09/03/2022   Chronic musculoskeletal pain 09/03/2022   Rotator cuff arthropathy, left 09/03/2022   Screening for lung cancer 09/03/2022   Early satiety 02/22/2018   Loss of weight 02/22/2018   S/P trigger finger release right thumb 11/141/9 02/08/2018   Diarrhea 11/12/2016   Abdominal pain 11/12/2016   Nausea without vomiting 11/12/2016        Past Medical History:  Diagnosis Date   Fibrocystic breast     GERD (gastroesophageal reflux disease)     Hyperlipidemia     Hypertension     Osteoarthritis     Osteoporosis     PONV  (postoperative nausea and vomiting)     Pre-diabetes     Shortness of breath dyspnea               Family History  Problem Relation Age of Onset   Anesthesia problems Neg Hx     Hypotension Neg Hx     Malignant hyperthermia Neg Hx     Pseudochol deficiency Neg Hx     Colon cancer Neg Hx               Past Surgical History:  Procedure Laterality Date   ABDOMINAL HYSTERECTOMY       BIOPSY   04/15/2018    Procedure: BIOPSY;  Surgeon: Corbin Ade, MD;  [facility]: [facility];  Service: Endoscopy;;  colon   BREAST CYST EXCISION Right     BREAST SURGERY        removal of cyst from right breast-fibrocystsic   BUNIONECTOMY   12/19/2010    Procedure: BUNIONECTOMY;  Surgeon: Dallas Schimke;  Location: [facility];  Service: Orthopedics;  Laterality: Right;  [name], [name] Bunionectomy   BUNIONECTOMY   05/22/2011    Procedure: Arbutus Leas;  Surgeon: Dallas Schimke, DPM;  Location: [facility];  Service: Orthopedics;  Laterality: Left;  [name] Bunionectomy Left Foot   CAPSULOTOMY   01/29/2012    Procedure: CAPSULOTOMY;  Surgeon: Dallas Schimke, DPM;  Location: [facility];  Service: Orthopedics;  Laterality: Left;   CATARACT EXTRACTION W/PHACO Right 08/09/2015    Procedure: CATARACT EXTRACTION PHACO AND INTRAOCULAR LENS PLACEMENT RIGHT EYE CDE=7.95;  Surgeon: Gemma Payor, MD;  Location: [facility];  Service: Ophthalmology;  Laterality: Right;   CATARACT EXTRACTION W/PHACO Left 08/27/2015    Procedure: CATARACT EXTRACTION PHACO AND INTRAOCULAR LENS PLACEMENT LEFT EYE; CDE:  6.12;  Surgeon: Gemma Payor, MD;  Location: [facility];  Service: Ophthalmology;  Laterality: Left;   COLONOSCOPY   2012    Dr. Jena Gauss: internal hemorrhoids, hyperplastic polyps. Surveillance 2022   COLONOSCOPY WITH PROPOFOL N/A 04/15/2018    one 7 mm polyp at splenic flexure, one 4 mm polyp in rectum, otherwise normal. S/p segmental biopsy:normal. Normal TI   DIAGNOSTIC LAPAROSCOPY  ESOPHAGOGASTRODUODENOSCOPY (EGD) WITH PROPOFOL N/A 04/15/2018    normal   FOOT GANGLION EXCISION        right foot   METATARSAL OSTEOTOMY   05/22/2011    Procedure: METATARSAL OSTEOTOMY;  Surgeon: Dallas Schimke, DPM;  Location: [facility];  Service: Orthopedics;  Laterality: Left;  [name] Osteotomy Left Foot   POLYPECTOMY   04/15/2018    Procedure: POLYPECTOMY;  Surgeon: Corbin Ade, MD;  [facility];  Service: Endoscopy;;  colon   REVERSE SHOULDER ARTHROPLASTY Left 09/08/2022    Procedure: LEFT REVERSE SHOULDER ARTHROPLASTY;  Surgeon: Oliver Barre, MD;  Location: [facility];  Service: Orthopedics;  Laterality: Left;  RNFA NEEDED   TONSILLECTOMY   age 33   TRIGGER FINGER RELEASE Right 01/28/2018    Procedure: RIGHT TRIGGER THUMB RELEASE;  Surgeon: Vickki Hearing, MD;  Location: [facility];  Service: Orthopedics;  Laterality: Right;   TUBAL LIGATION            Social History         Occupational History   Not on file  Tobacco Use   Smoking status: Former      Current packs/day: 0.00      Average packs/day: 1.5 packs/day for 47.4 years (71.0 ttl pk-yrs)      Types: Cigarettes      Start date: 03/18/1975      Quit date: 07/27/2022      Years since quitting: 0.5   Smokeless tobacco: Never  Substance and Sexual Activity   Alcohol use: No   Drug use: No   Sexual activity: Yes      Birth control/protection: Post-menopausal

## 2023-02-17 ENCOUNTER — Encounter (HOSPITAL_COMMUNITY): Payer: 59 | Admitting: Occupational Therapy

## 2023-02-17 ENCOUNTER — Ambulatory Visit (HOSPITAL_COMMUNITY): Payer: 59 | Admitting: Anesthesiology

## 2023-02-17 ENCOUNTER — Encounter (HOSPITAL_COMMUNITY)
Admission: RE | Disposition: A | Discharge status: Home / Self Care | Payer: Self-pay | Source: Home / Self Care | Attending: Orthopedic Surgery

## 2023-02-17 ENCOUNTER — Encounter (HOSPITAL_COMMUNITY): Payer: Self-pay | Admitting: Orthopedic Surgery

## 2023-02-17 ENCOUNTER — Ambulatory Visit (HOSPITAL_COMMUNITY)
Admission: RE | Admit: 2023-02-17 | Discharge: 2023-02-17 | Disposition: A | Discharge status: Home / Self Care | Payer: 59 | Attending: Orthopedic Surgery | Admitting: Orthopedic Surgery

## 2023-02-17 ENCOUNTER — Ambulatory Visit (HOSPITAL_BASED_OUTPATIENT_CLINIC_OR_DEPARTMENT_OTHER): Payer: 59 | Admitting: Anesthesiology

## 2023-02-17 DIAGNOSIS — M65331 Trigger finger, right middle finger: Secondary | ICD-10-CM

## 2023-02-17 DIAGNOSIS — E119 Type 2 diabetes mellitus without complications: Secondary | ICD-10-CM | POA: Diagnosis not present

## 2023-02-17 DIAGNOSIS — Z79899 Other long term (current) drug therapy: Secondary | ICD-10-CM | POA: Diagnosis not present

## 2023-02-17 DIAGNOSIS — M1990 Unspecified osteoarthritis, unspecified site: Secondary | ICD-10-CM | POA: Diagnosis not present

## 2023-02-17 DIAGNOSIS — Z87891 Personal history of nicotine dependence: Secondary | ICD-10-CM | POA: Insufficient documentation

## 2023-02-17 DIAGNOSIS — I10 Essential (primary) hypertension: Secondary | ICD-10-CM | POA: Diagnosis not present

## 2023-02-17 HISTORY — PX: TRIGGER FINGER RELEASE: SHX641

## 2023-02-17 LAB — GLUCOSE, CAPILLARY
Glucose-Capillary: 104 mg/dL — ABNORMAL HIGH (ref 70–99)
Glucose-Capillary: 133 mg/dL — ABNORMAL HIGH (ref 70–99)

## 2023-02-17 SURGERY — RELEASE, A1 PULLEY, FOR TRIGGER FINGER
Anesthesia: General | Site: Middle Finger | Laterality: Right

## 2023-02-17 MED ORDER — DEXAMETHASONE SODIUM PHOSPHATE 10 MG/ML IJ SOLN
INTRAMUSCULAR | Status: AC
  Filled 2023-02-17: qty 1

## 2023-02-17 MED ORDER — OXYCODONE HCL 5 MG PO TABS: 10.0000 mg | ORAL_TABLET | Freq: Once | ORAL | Status: DC

## 2023-02-17 MED ORDER — SODIUM CHLORIDE 0.9 % IR SOLN
Status: DC | PRN
  Administered 2023-02-17: 1000 mL

## 2023-02-17 MED ORDER — CHLORHEXIDINE GLUCONATE 0.12 % MT SOLN
OROMUCOSAL | Status: AC
  Filled 2023-02-17: qty 15

## 2023-02-17 MED ORDER — PROPOFOL 500 MG/50ML IV EMUL
INTRAVENOUS | Status: DC | PRN
  Administered 2023-02-17: 25 ug/kg/min via INTRAVENOUS

## 2023-02-17 MED ORDER — LIDOCAINE HCL (PF) 1 % IJ SOLN
INTRAMUSCULAR | Status: DC | PRN
  Administered 2023-02-17: 3 mL

## 2023-02-17 MED ORDER — CHLORHEXIDINE GLUCONATE 0.12 % MT SOLN
15.0000 mL | Freq: Once | OROMUCOSAL | Status: DC
Start: 2023-02-17 — End: 2023-02-17

## 2023-02-17 MED ORDER — ACETAMINOPHEN 500 MG PO TABS: 500.0000 mg | ORAL_TABLET | Freq: Once | ORAL | Status: DC

## 2023-02-17 MED ORDER — ONDANSETRON HCL 4 MG/2ML IJ SOLN
INTRAMUSCULAR | Status: AC
Start: 2023-02-17 — End: ?
  Filled 2023-02-17: qty 2

## 2023-02-17 MED ORDER — BUPIVACAINE HCL (PF) 0.5 % IJ SOLN
INTRAMUSCULAR | Status: DC | PRN
  Administered 2023-02-17: 5 mL

## 2023-02-17 MED ORDER — LIDOCAINE HCL (PF) 1 % IJ SOLN
INTRAMUSCULAR | Status: AC
  Filled 2023-02-17: qty 30

## 2023-02-17 MED ORDER — FENTANYL CITRATE (PF) 100 MCG/2ML IJ SOLN
INTRAMUSCULAR | Status: DC | PRN
  Administered 2023-02-17 (×4): 25 ug via INTRAVENOUS

## 2023-02-17 MED ORDER — CEFAZOLIN SODIUM-DEXTROSE 2-4 GM/100ML-% IV SOLN
INTRAVENOUS | Status: AC
  Filled 2023-02-17: qty 100

## 2023-02-17 MED ORDER — ONDANSETRON HCL 4 MG/2ML IJ SOLN
INTRAMUSCULAR | Status: DC | PRN
  Administered 2023-02-17: 4 mg via INTRAVENOUS

## 2023-02-17 MED ORDER — FENTANYL CITRATE (PF) 100 MCG/2ML IJ SOLN
INTRAMUSCULAR | Status: AC
  Filled 2023-02-17: qty 2

## 2023-02-17 MED ORDER — LACTATED RINGERS IV SOLN: INTRAVENOUS | Status: DC

## 2023-02-17 MED ORDER — PROPOFOL 10 MG/ML IV BOLUS
INTRAVENOUS | Status: DC | PRN
  Administered 2023-02-17: 20 mg via INTRAVENOUS
  Administered 2023-02-17: 30 mg via INTRAVENOUS
  Administered 2023-02-17: 20 mg via INTRAVENOUS

## 2023-02-17 MED ORDER — ORAL CARE MOUTH RINSE: 15.0000 mL | Freq: Once | OROMUCOSAL | Status: DC

## 2023-02-17 MED ORDER — DEXAMETHASONE SODIUM PHOSPHATE 10 MG/ML IJ SOLN
INTRAMUSCULAR | Status: DC | PRN
  Administered 2023-02-17: 5 mg via INTRAVENOUS

## 2023-02-17 MED ORDER — BUPIVACAINE HCL (PF) 0.5 % IJ SOLN
INTRAMUSCULAR | Status: AC
  Filled 2023-02-17: qty 30

## 2023-02-17 MED ORDER — CEFAZOLIN SODIUM-DEXTROSE 2-4 GM/100ML-% IV SOLN
2.0000 g | INTRAVENOUS | Status: AC
Start: 2023-02-17 — End: 2023-02-17
  Administered 2023-02-17: 2 g via INTRAVENOUS

## 2023-02-17 MED ORDER — LIDOCAINE HCL (CARDIAC) PF 100 MG/5ML IV SOSY
PREFILLED_SYRINGE | INTRAVENOUS | Status: DC | PRN
  Administered 2023-02-17: 40 mg via INTRAVENOUS

## 2023-02-17 MED ORDER — FENTANYL CITRATE PF 50 MCG/ML IJ SOSY
50.0000 ug | PREFILLED_SYRINGE | INTRAMUSCULAR | Status: DC | PRN
Start: 2023-02-17 — End: 2023-02-17

## 2023-02-17 SURGICAL SUPPLY — 37 items
BANDAGE ESMARK 4X12 BL STRL LF (DISPOSABLE) ×1 IMPLANT
BLADE SURG 15 STRL LF DISP TIS (BLADE) ×1 IMPLANT
BNDG COHESIVE 3X5 TAN STRL LF (GAUZE/BANDAGES/DRESSINGS) ×1 IMPLANT
BNDG COHESIVE 4X5 TAN STRL (GAUZE/BANDAGES/DRESSINGS) ×1 IMPLANT
BNDG CONFORM 2 STRL LF (GAUZE/BANDAGES/DRESSINGS) ×1 IMPLANT
BNDG ELASTIC 2X5.8 VLCR NS LF (GAUZE/BANDAGES/DRESSINGS) ×1 IMPLANT
BNDG ESMARK 4X12 BLUE STRL LF (DISPOSABLE) ×1
CHLORAPREP W/TINT 26 (MISCELLANEOUS) ×1 IMPLANT
CLOTH BEACON ORANGE TIMEOUT ST (SAFETY) ×1 IMPLANT
COVER LIGHT HANDLE STERIS (MISCELLANEOUS) ×2 IMPLANT
CUFF TOURN SGL QUICK 18X4 (TOURNIQUET CUFF) ×1 IMPLANT
DECANTER SPIKE VIAL GLASS SM (MISCELLANEOUS) ×1 IMPLANT
DRAPE HALF SHEET 40X57 (DRAPES) ×1 IMPLANT
ELECT NDL TIP 2.8 STRL (NEEDLE) ×1 IMPLANT
ELECT NEEDLE TIP 2.8 STRL (NEEDLE) ×1
ELECT REM PT RETURN 9FT ADLT (ELECTROSURGICAL) ×1
ELECTRODE REM PT RTRN 9FT ADLT (ELECTROSURGICAL) ×1 IMPLANT
GAUZE 4X4 16PLY ~~LOC~~+RFID DBL (SPONGE) ×1 IMPLANT
GAUZE SPONGE 4X4 12PLY STRL (GAUZE/BANDAGES/DRESSINGS) ×1 IMPLANT
GAUZE XEROFORM 1X8 LF (GAUZE/BANDAGES/DRESSINGS) ×1 IMPLANT
GLOVE BIOGEL PI IND STRL 7.0 (GLOVE) ×2 IMPLANT
GLOVE SS N UNI LF 8.5 STRL (GLOVE) ×1 IMPLANT
GLOVE SURG POLYISO LF SZ8 (GLOVE) ×1 IMPLANT
GOWN STRL REUS W/TWL LRG LVL3 (GOWN DISPOSABLE) ×1 IMPLANT
GOWN STRL REUS W/TWL XL LVL3 (GOWN DISPOSABLE) ×1 IMPLANT
KIT TURNOVER KIT A (KITS) ×1 IMPLANT
MANIFOLD NEPTUNE II (INSTRUMENTS) ×1 IMPLANT
NDL HYPO 21X1.5 SAFETY (NEEDLE) ×1 IMPLANT
NEEDLE HYPO 21X1.5 SAFETY (NEEDLE) ×1
NS IRRIG 1000ML POUR BTL (IV SOLUTION) ×1 IMPLANT
PACK BASIC LIMB (CUSTOM PROCEDURE TRAY) ×1 IMPLANT
PAD ARMBOARD 7.5X6 YLW CONV (MISCELLANEOUS) ×1 IMPLANT
POSITIONER HAND ALUMI XLG (MISCELLANEOUS) ×1 IMPLANT
POSITIONER HEAD 8X9X4 ADT (SOFTGOODS) ×1 IMPLANT
SET BASIN LINEN APH (SET/KITS/TRAYS/PACK) ×1 IMPLANT
SUT ETHILON 3 0 FSL (SUTURE) ×1 IMPLANT
SYR CONTROL 10ML LL (SYRINGE) ×1 IMPLANT

## 2023-02-17 NOTE — Anesthesia Preprocedure Evaluation (Addendum)
Anesthesia Evaluation    History of Anesthesia Complications (+) PONV and history of anesthetic complications  Airway Mallampati: III     Mouth opening: Limited Mouth Opening  Dental  (+) Missing, Dental Advisory Given   Pulmonary shortness of breath, Current Smoker   Pulmonary exam normal breath sounds clear to auscultation       Cardiovascular hypertension, On Medications + DOE  Normal cardiovascular exam Rhythm:regular Rate:Normal     Neuro/Psych    GI/Hepatic ,GERD  ,,  Endo/Other  Pre diabetes  Renal/GU      Musculoskeletal  (+) Arthritis , Osteoarthritis,    Abdominal   Peds  Hematology   Anesthesia Other Findings   Reproductive/Obstetrics                             Anesthesia Physical Anesthesia Plan  ASA: 2  Anesthesia Plan: General   Post-op Pain Management: Minimal or no pain anticipated   Induction:   PONV Risk Score and Plan: Propofol infusion  Airway Management Planned: Nasal Cannula, Natural Airway and Simple Face Mask  Additional Equipment: None  Intra-op Plan:   Post-operative Plan: Extubation in OR  Informed Consent: I have reviewed the patients History and Physical, chart, labs and discussed the procedure including the risks, benefits and alternatives for the proposed anesthesia with the patient or authorized representative who has indicated his/her understanding and acceptance.     Dental advisory given  Plan Discussed with: Anesthesiologist  Anesthesia Plan Comments:         Anesthesia Quick Evaluation

## 2023-02-17 NOTE — Interval H&P Note (Signed)
History and Physical Interval Note:  02/17/2023 11:13 AM  Leah Lynch  has presented today for surgery, with the diagnosis of Right middle /long finger trigger finger.  The various methods of treatment have been discussed with the patient and family. After consideration of risks, benefits and other options for treatment, the patient has consented to  Procedure(s): RELEASE TRIGGER FINGER/A-1 PULLEY (Right) middle /long  as a surgical intervention.  The patient's history has been reviewed, patient examined, no change in status, stable for surgery.  I have reviewed the patient's chart and labs.  Questions were answered to the patient's satisfaction.     Fuller Canada

## 2023-02-17 NOTE — Transfer of Care (Signed)
Immediate Anesthesia Transfer of Care Note  Patient: Leah Lynch  Procedure(s) Performed: RELEASE TRIGGER FINGER/A-1 PULLEY (Right: Middle Finger)  Patient Location: PACU  Anesthesia Type:General  Level of Consciousness: awake and patient cooperative  Airway & Oxygen Therapy: Patient Spontanous Breathing  Post-op Assessment: Report given to RN and Post -op Vital signs reviewed and stable  Post vital signs: Reviewed and stable  Last Vitals:  Vitals Value Taken Time  BP 108/54 02/17/23  1208  Temp 97.4 02/17/23  1208  Pulse 64 02/17/23 1207  Resp 18 02/17/23 1207  SpO2 98 % 02/17/23 1207  Vitals shown include unfiled device data.  Last Pain:  Vitals:   02/17/23 1014  TempSrc: Oral  PainSc: 0-No pain         Complications: No notable events documented.

## 2023-02-17 NOTE — Op Note (Signed)
02/17/2023  12:02 PM  PATIENT:  Leah Lynch  77 y.o. female  PRE-OPERATIVE DIAGNOSIS:  Right middle /long finger trigger finger  POST-OPERATIVE DIAGNOSIS:  Right middle /long finger trigger finger  PROCEDURE:  Procedure(s): RELEASE TRIGGER FINGER/A-1 PULLEY (Right) long / middle finger  FINDINGS: Stenosing tenosynovitis of the right long/middle finger  -Details of procedure  Ms. Cristea was evaluated in the preop area and cleared for surgery.  The surgical site was confirmed as the right long finger and marked in the chart was reviewed.  She was taken to the operating room for IV sedation followed by sterile prep and drape in the usual manner  After timeout was taken the limb was exsanguinated with a 4 inch Esmarch the tourniquet was elevated to 250 mmHg  Local anesthetic 1% lidocaine was injected into the proposed skin incision on the volar aspect of the right long/middle finger  A longitudinal incision was made at the beginning of the A1 pulley the subcutaneous tissue was divided blunt dissection was carried out to expose the medial and lateral aspects of the tendon and the neurovascular bundles were protected.  A Freer elevator was used to free find the edge of the A1 pulley and a sharp scissor was used to divide.  The tendons were pulled out of the wound and checked for smooth gliding.  Once this was satisfactory the wound was irrigated with saline followed by injection of Marcaine for local anesthetic and then closure with 3-0 nylon sutures x 5 in interrupted fashion  Sterile dressing applied  SURGEON:  Surgeons and Role:    * Vickki Hearing, MD - Primary  PHYSICIAN ASSISTANT:   ASSISTANTS: none   ANESTHESIA:   local and MAC  EBL:  5 mL   BLOOD ADMINISTERED:none  DRAINS: none   LOCAL MEDICATIONS USED:  MARCAINE    and LIDOCAINE   SPECIMEN:  No Specimen  DISPOSITION OF SPECIMEN:  N/A  COUNTS:  YES  TOURNIQUET:   Total Tourniquet Time  Documented: Upper Arm (Right) - 16 minutes Total: Upper Arm (Right) - 16 minutes   DICTATION: .Reubin Milan Dictation  PLAN OF CARE: Discharge to home after PACU  PATIENT DISPOSITION:  PACU - hemodynamically stable.   Delay start of Pharmacological VTE agent (>24hrs) due to surgical blood loss or risk of bleeding: not applicable

## 2023-02-17 NOTE — Anesthesia Postprocedure Evaluation (Signed)
Anesthesia Post Note  Patient: Leah Lynch  Procedure(s) Performed: RELEASE TRIGGER FINGER/A-1 PULLEY (Right: Middle Finger)  Patient location during evaluation: PACU Anesthesia Type: General Level of consciousness: awake and alert Pain management: pain level controlled Vital Signs Assessment: post-procedure vital signs reviewed and stable Respiratory status: spontaneous breathing, nonlabored ventilation, respiratory function stable and patient connected to nasal cannula oxygen Cardiovascular status: blood pressure returned to baseline and stable Postop Assessment: no apparent nausea or vomiting Anesthetic complications: no   There were no known notable events for this encounter.   Last Vitals:  Vitals:   02/17/23 1240 02/17/23 1248  BP: (!) 125/54 (!) 129/57  Pulse: 62 61  Resp: 14 14  Temp:  36.8 C  SpO2: 92% 95%    Last Pain:  Vitals:   02/17/23 1248  TempSrc: Oral  PainSc: 0-No pain                 [name]

## 2023-02-17 NOTE — Brief Op Note (Signed)
02/17/2023  12:02 PM  PATIENT:  Leah Lynch  77 y.o. female  PRE-OPERATIVE DIAGNOSIS:  Right middle /long finger trigger finger  POST-OPERATIVE DIAGNOSIS:  Right middle /long finger trigger finger  PROCEDURE:  Procedure(s): RELEASE TRIGGER FINGER/A-1 PULLEY (Right) long / middle finger  FINDINGS: Stenosing tenosynovitis of the right long/middle finger  -Details of procedure  Ms. Cristea was evaluated in the preop area and cleared for surgery.  The surgical site was confirmed as the right long finger and marked in the chart was reviewed.  She was taken to the operating room for IV sedation followed by sterile prep and drape in the usual manner  After timeout was taken the limb was exsanguinated with a 4 inch Esmarch the tourniquet was elevated to 250 mmHg  Local anesthetic 1% lidocaine was injected into the proposed skin incision on the volar aspect of the right long/middle finger  A longitudinal incision was made at the beginning of the A1 pulley the subcutaneous tissue was divided blunt dissection was carried out to expose the medial and lateral aspects of the tendon and the neurovascular bundles were protected.  A Freer elevator was used to free find the edge of the A1 pulley and a sharp scissor was used to divide.  The tendons were pulled out of the wound and checked for smooth gliding.  Once this was satisfactory the wound was irrigated with saline followed by injection of Marcaine for local anesthetic and then closure with 3-0 nylon sutures x 5 in interrupted fashion  Sterile dressing applied  SURGEON:  Surgeons and Role:    * Vickki Hearing, MD - Primary  PHYSICIAN ASSISTANT:   ASSISTANTS: none   ANESTHESIA:   local and MAC  EBL:  5 mL   BLOOD ADMINISTERED:none  DRAINS: none   LOCAL MEDICATIONS USED:  MARCAINE    and LIDOCAINE   SPECIMEN:  No Specimen  DISPOSITION OF SPECIMEN:  N/A  COUNTS:  YES  TOURNIQUET:   Total Tourniquet Time  Documented: Upper Arm (Right) - 16 minutes Total: Upper Arm (Right) - 16 minutes   DICTATION: .Reubin Milan Dictation  PLAN OF CARE: Discharge to home after PACU  PATIENT DISPOSITION:  PACU - hemodynamically stable.   Delay start of Pharmacological VTE agent (>24hrs) due to surgical blood loss or risk of bleeding: not applicable

## 2023-02-17 NOTE — Anesthesia Procedure Notes (Signed)
Date/Time: 02/17/2023 11:20 AM  Performed by: Franco Nones, CRNAPre-anesthesia Checklist: Patient identified, Emergency Drugs available, Suction available, Timeout performed and Patient being monitored Patient Re-evaluated:Patient Re-evaluated prior to induction Oxygen Delivery Method: Non-rebreather mask

## 2023-02-18 ENCOUNTER — Encounter: Payer: Self-pay | Admitting: Internal Medicine

## 2023-02-18 ENCOUNTER — Ambulatory Visit: Payer: 59 | Admitting: Internal Medicine

## 2023-02-18 VITALS — BP 132/74 | HR 71 | Ht 59.0 in | Wt 139.6 lb

## 2023-02-18 DIAGNOSIS — E1169 Type 2 diabetes mellitus with other specified complication: Secondary | ICD-10-CM | POA: Diagnosis not present

## 2023-02-18 DIAGNOSIS — Z23 Encounter for immunization: Secondary | ICD-10-CM

## 2023-02-18 DIAGNOSIS — I10 Essential (primary) hypertension: Secondary | ICD-10-CM

## 2023-02-18 DIAGNOSIS — E119 Type 2 diabetes mellitus without complications: Secondary | ICD-10-CM

## 2023-02-18 DIAGNOSIS — E782 Mixed hyperlipidemia: Secondary | ICD-10-CM

## 2023-02-18 NOTE — Progress Notes (Unsigned)
Established Patient Office Visit  Subjective   Patient ID: Leah Lynch, female    DOB: Dec 21, 1945  Age: 77 y.o. MRN: [id number]  Chief Complaint  Patient presents with   Hyperlipidemia    Three month follow up    Leah Lynch returns to care today for routine follow-up.  She was last evaluated by me on 9/3.  No medication changes were made and hydrocodone-acetaminophen was refilled as a bridge until she was able to establish care with pain management.  In the interim, she has been evaluated by orthopedic surgery and underwent right middle finger trigger finger release yesterday (12/3).  There have otherwise been no acute interval events.  Past Medical History:  Diagnosis Date   Fibrocystic breast    GERD (gastroesophageal reflux disease)    Hyperlipidemia    Hypertension    Osteoarthritis    Osteoporosis    PONV (postoperative nausea and vomiting)    Pre-diabetes    Shortness of breath dyspnea    Past Surgical History:  Procedure Laterality Date   ABDOMINAL HYSTERECTOMY     BIOPSY  04/15/2018   Procedure: BIOPSY;  Surgeon: Corbin Ade, MD;  [facility];  Service: Endoscopy;;  colon   BREAST CYST EXCISION Right    BREAST SURGERY     removal of cyst from right breast-fibrocystsic   BUNIONECTOMY  12/19/2010   Procedure: BUNIONECTOMY;  Surgeon: Dallas Schimke;  Location: [facility];  Service: Orthopedics;  Laterality: Right;  [name], [name] Bunionectomy   BUNIONECTOMY  05/22/2011   Procedure: Arbutus Leas;  Surgeon: Dallas Schimke, DPM;  Location: [facility];  Service: Orthopedics;  Laterality: Left;  [name] Bunionectomy Left Foot   CAPSULOTOMY  01/29/2012   Procedure: CAPSULOTOMY;  Surgeon: Dallas Schimke, DPM;  [facility]: [facility];  Service: Orthopedics;  Laterality: Left;   CATARACT EXTRACTION W/PHACO Right 08/09/2015   Procedure: CATARACT EXTRACTION PHACO AND INTRAOCULAR LENS PLACEMENT RIGHT EYE CDE=7.95;  Surgeon: Gemma Payor,  MD;  Location: [facility];  Service: Ophthalmology;  Laterality: Right;   CATARACT EXTRACTION W/PHACO Left 08/27/2015   Procedure: CATARACT EXTRACTION PHACO AND INTRAOCULAR LENS PLACEMENT LEFT EYE; CDE:  6.12;  Surgeon: Gemma Payor, MD;  Location: [facility];  Service: Ophthalmology;  Laterality: Left;   COLONOSCOPY  2012   Dr. Jena Gauss: internal hemorrhoids, hyperplastic polyps. Surveillance 2022   COLONOSCOPY WITH PROPOFOL N/A 04/15/2018   one 7 mm polyp at splenic flexure, one 4 mm polyp in rectum, otherwise normal. S/p segmental biopsy:normal. Normal TI   DIAGNOSTIC LAPAROSCOPY     ESOPHAGOGASTRODUODENOSCOPY (EGD) WITH PROPOFOL N/A 04/15/2018   normal   FOOT GANGLION EXCISION     right foot   METATARSAL OSTEOTOMY  05/22/2011   Procedure: METATARSAL OSTEOTOMY;  Surgeon: Dallas Schimke, DPM;  Location: [facility];  Service: Orthopedics;  Laterality: Left;  [name] Osteotomy Left Foot   POLYPECTOMY  04/15/2018   Procedure: POLYPECTOMY;  Surgeon: Corbin Ade, MD;  [facility];  Service: Endoscopy;;  colon   REVERSE SHOULDER ARTHROPLASTY Left 09/08/2022   Procedure: LEFT REVERSE SHOULDER ARTHROPLASTY;  Surgeon: Oliver Barre, MD;  Location: [facility];  Service: Orthopedics;  Laterality: Left;  RNFA NEEDED   TONSILLECTOMY  age 13   TRIGGER FINGER RELEASE Right 01/28/2018   Procedure: RIGHT TRIGGER THUMB RELEASE;  Surgeon: Vickki Hearing, MD;  Location: [facility];  Service: Orthopedics;  Laterality: Right;   TUBAL LIGATION     Social History   Tobacco  Use   Smoking status: Every Day    Current packs/day: 0.00    Average packs/day: 1.5 packs/day for 47.4 years (71.0 ttl pk-yrs)    Types: Cigarettes    Start date: 03/18/1975    Last attempt to quit: 07/27/2022    Years since quitting: 0.5   Smokeless tobacco: Never  Vaping Use   Vaping status: Never Used  Substance Use Topics   Alcohol use: No   Drug use: No   Family History  Problem Relation Age of Onset   Anesthesia problems  Neg Hx    Hypotension Neg Hx    Malignant hyperthermia Neg Hx    Pseudochol deficiency Neg Hx    Colon cancer Neg Hx    Allergies  Allergen Reactions   Bee Venom Swelling   Penicillins Hives, Itching and Other (See Comments)    Has patient had a PCN reaction causing immediate rash, facial/tongue/throat swelling, SOB or lightheadedness with hypotension: Yes Has patient had a PCN reaction causing severe rash involving mucus membranes or skin necrosis: No Has patient had a PCN reaction that required hospitalization No Has patient had a PCN reaction occurring within the last 10 years: YES If all of the above answers are "NO", then may proceed with Cephalosporin use.    Latex Itching   Sulfa Antibiotics Itching   ROS    Objective:     BP 132/74 (BP Location: Left Arm, Patient Position: Sitting, Cuff Size: Normal)   Pulse 71   Ht 4\' 11"  (1.499 m)   Wt 139 lb 9.6 oz (63.3 kg)   SpO2 90%   BMI 28.20 kg/m  BP Readings from Last 3 Encounters:  02/18/23 132/74  02/17/23 (!) 129/57  02/11/23 (!) 148/75   Physical Exam Vitals reviewed.  Constitutional:      General: She is not in acute distress.    Appearance: Normal appearance. She is not toxic-appearing.  HENT:     Head: Normocephalic and atraumatic.     Right Ear: External ear normal.     Left Ear: External ear normal.     Nose: Nose normal. No congestion or rhinorrhea.     Mouth/Throat:     Mouth: Mucous membranes are moist.     Pharynx: Oropharynx is clear. No oropharyngeal exudate or posterior oropharyngeal erythema.  Eyes:     General: No scleral icterus.    Extraocular Movements: Extraocular movements intact.     Conjunctiva/sclera: Conjunctivae normal.     Pupils: Pupils are equal, round, and reactive to light.  Cardiovascular:     Rate and Rhythm: Normal rate and regular rhythm.     Pulses: Normal pulses.     Heart sounds: Normal heart sounds. No murmur heard.    No friction rub. No gallop.  Pulmonary:      Effort: Pulmonary effort is normal.     Breath sounds: Normal breath sounds. No wheezing, rhonchi or rales.  Abdominal:     General: Abdomen is flat. Bowel sounds are normal. There is no distension.     Palpations: Abdomen is soft.     Tenderness: There is no abdominal tenderness.  Musculoskeletal:        General: No swelling. Normal range of motion.     Cervical back: Normal range of motion.     Right lower leg: No edema.     Left lower leg: No edema.  Lymphadenopathy:     Cervical: No cervical adenopathy.  Skin:    General: Skin is warm  and dry.     Capillary Refill: Capillary refill takes less than 2 seconds.     Coloration: Skin is not jaundiced.  Neurological:     General: No focal deficit present.     Mental Status: She is alert and oriented to person, place, and time.  Psychiatric:        Mood and Affect: Mood normal.        Behavior: Behavior normal.     Last CBC Lab Results  Component Value Date   WBC 8.1 02/11/2023   HGB 12.1 02/11/2023   HCT 38.7 02/11/2023   MCV 89.2 02/11/2023   MCH 27.9 02/11/2023   RDW 15.7 (H) 02/11/2023   PLT 258 02/11/2023   Last metabolic panel Lab Results  Component Value Date   GLUCOSE 92 02/11/2023   NA 144 02/11/2023   K 3.9 02/11/2023   CL 106 02/11/2023   CO2 29 02/11/2023   BUN 22 02/11/2023   CREATININE 0.81 02/11/2023   GFRNONAA >60 02/11/2023   CALCIUM 9.1 02/11/2023   PROT 6.6 08/27/2022   ALBUMIN 4.3 08/27/2022   LABGLOB 2.3 08/27/2022   AGRATIO 1.9 08/27/2022   BILITOT 0.2 08/27/2022   ALKPHOS 125 (H) 08/27/2022   AST 26 08/27/2022   ALT 23 08/27/2022   ANIONGAP 9 02/11/2023   Last lipids Lab Results  Component Value Date   CHOL 189 08/27/2022   HDL 76 08/27/2022   LDLCALC 87 08/27/2022   TRIG 152 (H) 08/27/2022   CHOLHDL 2.5 08/27/2022   Last hemoglobin A1c Lab Results  Component Value Date   HGBA1C 6.6 (H) 02/11/2023   Last thyroid functions Lab Results  Component Value Date   TSH 5.080 (H)  08/27/2022   Last vitamin D Lab Results  Component Value Date   VD25OH 34.2 08/27/2022   Last vitamin B12 and Folate Lab Results  Component Value Date   VITAMINB12 852 08/27/2022   FOLATE 16.5 08/27/2022   The 10-year ASCVD risk score (Arnett DK, et al., 2019) is: 58.9%    Assessment & Plan:   Problem List Items Addressed This Visit   None   No follow-ups on file.    Billie Lade, MD

## 2023-02-18 NOTE — Patient Instructions (Signed)
It was a pleasure to see you today.  Thank you for giving [place] the opportunity to be involved in your care.  Below is a brief recap of your visit and next steps.  We will plan to see you again in 3 months.  Summary No medication changes today Repeat lipid panel ordered Pneumonia vaccine administered Follow up in 3 months

## 2023-02-19 ENCOUNTER — Ambulatory Visit (HOSPITAL_COMMUNITY): Payer: 59 | Admitting: Occupational Therapy

## 2023-02-19 ENCOUNTER — Other Ambulatory Visit: Payer: Self-pay | Admitting: Internal Medicine

## 2023-02-19 ENCOUNTER — Encounter (HOSPITAL_COMMUNITY): Payer: Self-pay | Admitting: Occupational Therapy

## 2023-02-19 ENCOUNTER — Encounter: Payer: Self-pay | Admitting: Internal Medicine

## 2023-02-19 DIAGNOSIS — R29898 Other symptoms and signs involving the musculoskeletal system: Secondary | ICD-10-CM | POA: Diagnosis not present

## 2023-02-19 DIAGNOSIS — I10 Essential (primary) hypertension: Secondary | ICD-10-CM

## 2023-02-19 DIAGNOSIS — M25512 Pain in left shoulder: Secondary | ICD-10-CM

## 2023-02-19 DIAGNOSIS — Z23 Encounter for immunization: Secondary | ICD-10-CM | POA: Insufficient documentation

## 2023-02-19 DIAGNOSIS — M25612 Stiffness of left shoulder, not elsewhere classified: Secondary | ICD-10-CM | POA: Diagnosis not present

## 2023-02-19 DIAGNOSIS — E119 Type 2 diabetes mellitus without complications: Secondary | ICD-10-CM

## 2023-02-19 LAB — LIPID PANEL
Chol/HDL Ratio: 2.1 {ratio} (ref 0.0–4.4)
Cholesterol, Total: 156 mg/dL (ref 100–199)
HDL: 75 mg/dL (ref >39)
LDL Chol Calc (NIH): 65 mg/dL (ref 0–99)
Triglycerides: 88 mg/dL (ref 0–149)
VLDL Cholesterol Cal: 16 mg/dL (ref 5–40)

## 2023-02-19 NOTE — Assessment & Plan Note (Signed)
PCV 20 administered today 

## 2023-02-19 NOTE — Assessment & Plan Note (Signed)
Remains adequately controlled on current antihypertensive regimen consisting of chlorthalidone 25 mg daily and lisinopril 2.5 mg daily.

## 2023-02-19 NOTE — Assessment & Plan Note (Signed)
Lipid panel updated in June.  Total cholesterol 189 and LDL 87.  Atorvastatin was increased to 80 mg daily in light of this result. -Repeat lipid panel ordered today

## 2023-02-19 NOTE — Therapy (Signed)
OUTPATIENT OCCUPATIONAL THERAPY ORTHO TREATMENT NOTE  Patient Name: Leah Lynch MRN: [id number] DOB:29-Dec-1945, 77 y.o., female Today's Date: 02/19/2023   END OF SESSION:    02/19/23 1101  OT Visits / Re-Eval  Visit Number 26  Number of Visits 29  Date for OT Re-Evaluation 03/06/23  Authorization  Authorization Type [employer] Medicare Dual Complete  Progress Note Due on Visit 25  OT Time Calculation  OT Start Time 1020  OT Stop Time 1101  OT Time Calculation (min) 41 min  End of Session  Activity Tolerance Patient tolerated treatment well  Behavior During Therapy WFL for tasks assessed/performed     Past Medical History:  Diagnosis Date   Fibrocystic breast    GERD (gastroesophageal reflux disease)    Hyperlipidemia    Hypertension    Osteoarthritis    Osteoporosis    PONV (postoperative nausea and vomiting)    Pre-diabetes    Shortness of breath dyspnea    Past Surgical History:  Procedure Laterality Date   ABDOMINAL HYSTERECTOMY     BIOPSY  04/15/2018   Procedure: BIOPSY;  Surgeon: Corbin Ade, MD;  [facility];  Service: Endoscopy;;  colon   BREAST CYST EXCISION Right    BREAST SURGERY     removal of cyst from right breast-fibrocystsic   BUNIONECTOMY  12/19/2010   Procedure: BUNIONECTOMY;  Surgeon: Dallas Schimke;  Location: [facility];  Service: Orthopedics;  Laterality: Right;  [name], [name] Bunionectomy   BUNIONECTOMY  05/22/2011   Procedure: Arbutus Leas;  Surgeon: Dallas Schimke, DPM;  Location: [facility];  Service: Orthopedics;  Laterality: Left;  [name] Bunionectomy Left Foot   CAPSULOTOMY  01/29/2012   Procedure: CAPSULOTOMY;  Surgeon: Dallas Schimke, DPM;  Location: [facility];  Service: Orthopedics;  Laterality: Left;   CATARACT EXTRACTION W/PHACO Right 08/09/2015   Procedure: CATARACT EXTRACTION PHACO AND INTRAOCULAR LENS PLACEMENT RIGHT EYE CDE=7.95;  Surgeon: Gemma Payor, MD;  Location: [facility];  Service:  Ophthalmology;  Laterality: Right;   CATARACT EXTRACTION W/PHACO Left 08/27/2015   Procedure: CATARACT EXTRACTION PHACO AND INTRAOCULAR LENS PLACEMENT LEFT EYE; CDE:  6.12;  Surgeon: Gemma Payor, MD;  Location: [facility];  Service: Ophthalmology;  Laterality: Left;   COLONOSCOPY  2012   Dr. Jena Gauss: internal hemorrhoids, hyperplastic polyps. Surveillance 2022   COLONOSCOPY WITH PROPOFOL N/A 04/15/2018   one 7 mm polyp at splenic flexure, one 4 mm polyp in rectum, otherwise normal. S/p segmental biopsy:normal. Normal TI   DIAGNOSTIC LAPAROSCOPY     ESOPHAGOGASTRODUODENOSCOPY (EGD) WITH PROPOFOL N/A 04/15/2018   normal   FOOT GANGLION EXCISION     right foot   METATARSAL OSTEOTOMY  05/22/2011   Procedure: METATARSAL OSTEOTOMY;  Surgeon: Dallas Schimke, DPM;  Location: [facility];  Service: Orthopedics;  Laterality: Left;  [name] Osteotomy Left Foot   POLYPECTOMY  04/15/2018   Procedure: POLYPECTOMY;  Surgeon: Corbin Ade, MD;  [facility];  Service: Endoscopy;;  colon   REVERSE SHOULDER ARTHROPLASTY Left 09/08/2022   Procedure: LEFT REVERSE SHOULDER ARTHROPLASTY;  Surgeon: Oliver Barre, MD;  Location: [facility];  Service: Orthopedics;  Laterality: Left;  RNFA NEEDED   TONSILLECTOMY  age 65   TRIGGER FINGER RELEASE Right 01/28/2018   Procedure: RIGHT TRIGGER THUMB RELEASE;  Surgeon: Vickki Hearing, MD;  Location: [facility];  Service: Orthopedics;  Laterality: Right;   TUBAL LIGATION     Patient Active Problem List   Diagnosis Date Noted   Need  for pneumococcal 20-valent conjugate vaccination 02/19/2023   Trigger finger, right middle finger 02/17/2023   Need for influenza vaccination 11/18/2022   Controlled type 2 diabetes mellitus without complication, without long-term current use of insulin (HCC) 09/30/2022   Rotator cuff arthropathy of left shoulder 09/08/2022   Essential hypertension 09/03/2022   GERD (gastroesophageal reflux disease) 09/03/2022   Hyperlipidemia  09/03/2022   Osteoporosis 09/03/2022   Chronic musculoskeletal pain 09/03/2022   Rotator cuff arthropathy, left 09/03/2022   Screening for lung cancer 09/03/2022   Early satiety 02/22/2018   Loss of weight 02/22/2018   S/P trigger finger release right thumb 11/141/9 02/08/2018   Diarrhea 11/12/2016   Abdominal pain 11/12/2016   Nausea without vomiting 11/12/2016    PCP: Dr. Christel Mormon REFERRING PROVIDER: Dr. Thane Edu  ONSET DATE: 09/08/22  REFERRING DIAG: s/p left reverse TSA  THERAPY DIAG:  No diagnosis found.  Rationale for Evaluation and Treatment: Rehabilitation  SUBJECTIVE:   SUBJECTIVE STATEMENT: S: II just had my trigger finger released 2 days ago.  PERTINENT HISTORY: Pt is a 77 y/o female s/p left reverse TSA on 09/08/22. Pt received HH therapy for several weeks, stopped approximately 1 week ago.   PRECAUTIONS: Shoulder See Protocol  WEIGHT BEARING RESTRICTIONS: Yes >5lbs  PAIN:  Are you having pain? No   FALLS: Has patient fallen in last 6 months? Yes. Number of falls 1  PLOF: Independent  PATIENT GOALS: To be able to use the LUE.   NEXT MD VISIT: 03/03/23  OBJECTIVE:   HAND DOMINANCE: Right  ADLs: Overall ADLs: Pt reports she is having difficulty moving her arm for most tasks. Pt has difficulty with reaching up overhead and behind her back. She is able to dress herself, is unable to reach back behind her for pulling pants or underwear over hips. Pt is not lifting anything of weight, does not lift pots and pans. Pt is sleeping in the bed, on average sleeps ok, has difficulty when she is hurting.   FUNCTIONAL OUTCOME MEASURES: FOTO: 50/100 9/18: 56/100  UPPER EXTREMITY ROM:       Assessed in supine, er/IR adducted  Passive ROM Left eval Left 12/03/22  Shoulder flexion 120 141  Shoulder abduction 142 145  Shoulder internal rotation 90 90  Shoulder external rotation 35 45  (Blank rows = not tested)  Assessed in seated, er/IR  adducted  Active ROM Left eval Left 12/03/22 Left 12/16/22 Left  01/06/23 Left 01/29/23  Shoulder flexion 102 105 114 120 148  Shoulder abduction 76 112 112 109 112  Shoulder internal rotation 90 90 90 90 90  Shoulder external rotation 0 6 36 40 38  (Blank rows = not tested)    UPPER EXTREMITY MMT:     Assessed in sitting, er/IR adducted-via observation on eval  9/18: Assessed in sitting, er/IR adducted  MMT Left eval Left 12/03/22 Left 12/16/22 Left 01/06/23 Left 01/29/23  Shoulder flexion 3-/5 4-/5 4/5 4/5 4/5  Shoulder abduction 3-/5 4-/5 4/5 4/5 4+/5  Shoulder internal rotation 3/5 4/5 4+/5 5/5 5/5  Shoulder external rotation 3-/5 3-/5 4-/5 4/5 4+/5  (Blank rows = not tested)  EDEMA: occasional with weather changes  OBSERVATIONS: mod fascial restrictions along upper arm, anterior shoulder, and trapezius regions   TODAY'S TREATMENT:  DATE:   02/19/23 -Manual Therapy: myofascial release and trigger point applied to the bicep, deltoid, trapezius, scapular region and axillary region in order to reduce fascial restrictions and pain in order to improve ROM. -A/ROM: standing, flexion, abduction, protraction, horizontal abduction, er/IR, x12 -ABC's in the air -Proximal Shoulder Strengthening: paddles, criss cross, circles both directions, x12 each -Shoulder Strengthening: 2#, flexion, abduction, protraction, horizontal abduction, er/IR, x10  02/16/23 -Manual Therapy: myofascial release and trigger point applied to the bicep, deltoid, trapezius, scapular region and axillary region in order to reduce fascial restrictions and pain in order to improve ROM. -A/ROM: side lying, flexion, abduction, protraction, horizontal abduction, er/IR, x12 -Shoulder Strengthening: side lying, 2# weight,  flexion, abduction, protraction, horizontal abduction, er/IR,  x10 -A/ROM: standing, flexion, abduction, protraction, horizontal abduction, er/IR, x12  02/10/23 -UBE: level 1, 2.5' forwards and backwards -Pulleys: flexion, abduction, x60" -A/ROM: seated, flexion, abduction, protraction, horizontal abduction, er/IR, x12 -Scapular Strengthening: red band, extension, retraction, rows, x15 -Shoulder Strengthening: red band, horizontal abduction, er, IR, abduction, flexion, x15   PATIENT EDUCATION: Education details: Review HEP Person educated: Patient Education method: Programmer, multimedia, Demonstration, and Handouts Education comprehension: verbalized understanding and returned demonstration  HOME EXERCISE PROGRAM: Eval: AA/ROM 9/4: A/ROM 9/10: Wall Slides 9/18: shoulder stretches 10/17: scapular strengthening-red band 11/1: weighted ROM with water bottles/cans  11/11: Loop Band Exercises  GOALS: Goals reviewed with patient? Yes   SHORT TERM GOALS: Target date: 12/05/22  Pt will be provided with and educated on HEP to improve mobility in LUE required for use during ADL completion.   Goal status: IN PROGRESS  2.  Pt will increase LUE A/ROM by 30+ degrees to improve ability to use LUE during dressing tasks with minimal compensatory techniques.   Goal status: IN PROGRESS  3.  Pt will increase LUE strength to 4/5 to improve ability to lift and move pots and pans during meal preparation and cleanup tasks.   Goal status: IN PROGRESS  4.  Pt will decrease pain in LUE to 3/10 or less to improve ability to sleep for 2+ consecutive hours without waking due to pain  Goal status: IN PROGRESS  5.  Pt will decrease LUE fascial restrictions to min amounts or less to improve mobility required for functional reaching tasks.      Goal status: IN PROGRESS   ASSESSMENT:  CLINICAL IMPRESSION: This session, pt had a limited session due to recent trigger point release on her RUE. She was unable to grip anything in the RUE this session to assist with  strengthening. Session focused on endurance and proximal shoulder strengthening/stability, as well as continuing strengthening with 2#, despite limited ROM. Verbal and tactile cuing provided for positioning and technique throughout session.   PERFORMANCE DEFICITS: in functional skills including in functional skills including ADLs, IADLs, coordination, tone, ROM, strength, pain, fascial restrictions, muscle spasms, and UE functional use   PLAN:  OT FREQUENCY: 2x/week  OT DURATION: 4 weeks  PLANNED INTERVENTIONS: self care/ADL training, therapeutic exercise, therapeutic activity, neuromuscular re-education, manual therapy, passive range of motion, splinting, electrical stimulation, ultrasound, moist heat, cryotherapy, patient/family education, and DME and/or AE instructions  CONSULTED AND AGREED WITH PLAN OF CARE: Patient  PLAN FOR NEXT SESSION: Follow up on HEP, shoulder strengthening exercises   Trish Mage, OTR/L [phone] 02/19/2023, 10:24 AM

## 2023-02-19 NOTE — Assessment & Plan Note (Addendum)
Remains diet controlled.  A1c 6.6 on labs obtained last week.  No indication to start medication currently. -Ophthalmology follow-up is scheduled for later this month

## 2023-02-23 ENCOUNTER — Encounter (HOSPITAL_COMMUNITY): Payer: Self-pay | Admitting: Orthopedic Surgery

## 2023-02-24 ENCOUNTER — Encounter (HOSPITAL_COMMUNITY): Payer: Self-pay | Admitting: Occupational Therapy

## 2023-02-24 ENCOUNTER — Ambulatory Visit (HOSPITAL_COMMUNITY): Payer: 59 | Admitting: Occupational Therapy

## 2023-02-24 DIAGNOSIS — M25612 Stiffness of left shoulder, not elsewhere classified: Secondary | ICD-10-CM

## 2023-02-24 DIAGNOSIS — R29898 Other symptoms and signs involving the musculoskeletal system: Secondary | ICD-10-CM

## 2023-02-24 DIAGNOSIS — M25512 Pain in left shoulder: Secondary | ICD-10-CM | POA: Diagnosis not present

## 2023-02-24 NOTE — Patient Instructions (Signed)

## 2023-02-24 NOTE — Therapy (Signed)
OUTPATIENT OCCUPATIONAL THERAPY ORTHO TREATMENT NOTE  Patient Name: Leah Lynch MRN: [id number] DOB:10/30/1945, 77 y.o., female Today's Date: 02/24/2023   END OF SESSION:  OT End of Session - 02/24/23 1146     Visit Number 27    Number of Visits 29    Date for OT Re-Evaluation 03/06/23    Authorization Type [employer] Medicare Dual Complete    Progress Note Due on Visit 25    OT Start Time 1104    OT Stop Time 1146    OT Time Calculation (min) 42 min    Activity Tolerance Patient tolerated treatment well    Behavior During Therapy WFL for tasks assessed/performed             Past Medical History:  Diagnosis Date   Fibrocystic breast    GERD (gastroesophageal reflux disease)    Hyperlipidemia    Hypertension    Osteoarthritis    Osteoporosis    PONV (postoperative nausea and vomiting)    Pre-diabetes    Shortness of breath dyspnea    Past Surgical History:  Procedure Laterality Date   ABDOMINAL HYSTERECTOMY     BIOPSY  04/15/2018   Procedure: BIOPSY;  Surgeon: Corbin Ade, MD;  [facility];  Service: Endoscopy;;  colon   BREAST CYST EXCISION Right    BREAST SURGERY     removal of cyst from right breast-fibrocystsic   BUNIONECTOMY  12/19/2010   Procedure: BUNIONECTOMY;  Surgeon: Dallas Schimke;  Location: [facility];  Service: Orthopedics;  Laterality: Right;  [name], [name] Bunionectomy   BUNIONECTOMY  05/22/2011   Procedure: Arbutus Leas;  Surgeon: Dallas Schimke, DPM;  Location: [facility];  Service: Orthopedics;  Laterality: Left;  [name] Bunionectomy Left Foot   CAPSULOTOMY  01/29/2012   Procedure: CAPSULOTOMY;  Surgeon: Dallas Schimke, DPM;  Location: [facility];  Service: Orthopedics;  Laterality: Left;   CATARACT EXTRACTION W/PHACO Right 08/09/2015   Procedure: CATARACT EXTRACTION PHACO AND INTRAOCULAR LENS PLACEMENT RIGHT EYE CDE=7.95;  Surgeon: Gemma Payor, MD;  Location: [facility];  Service: Ophthalmology;   Laterality: Right;   CATARACT EXTRACTION W/PHACO Left 08/27/2015   Procedure: CATARACT EXTRACTION PHACO AND INTRAOCULAR LENS PLACEMENT LEFT EYE; CDE:  6.12;  Surgeon: Gemma Payor, MD;  Location: [facility];  Service: Ophthalmology;  Laterality: Left;   COLONOSCOPY  2012   Dr. Jena Gauss: internal hemorrhoids, hyperplastic polyps. Surveillance 2022   COLONOSCOPY WITH PROPOFOL N/A 04/15/2018   one 7 mm polyp at splenic flexure, one 4 mm polyp in rectum, otherwise normal. S/p segmental biopsy:normal. Normal TI   DIAGNOSTIC LAPAROSCOPY     ESOPHAGOGASTRODUODENOSCOPY (EGD) WITH PROPOFOL N/A 04/15/2018   normal   FOOT GANGLION EXCISION     right foot   METATARSAL OSTEOTOMY  05/22/2011   Procedure: METATARSAL OSTEOTOMY;  Surgeon: Dallas Schimke, DPM;  [facility]: [facility];  Service: Orthopedics;  Laterality: Left;  [name] Osteotomy Left Foot   POLYPECTOMY  04/15/2018   Procedure: POLYPECTOMY;  Surgeon: Corbin Ade, MD;  [facility];  Service: Endoscopy;;  colon   REVERSE SHOULDER ARTHROPLASTY Left 09/08/2022   Procedure: LEFT REVERSE SHOULDER ARTHROPLASTY;  Surgeon: Oliver Barre, MD;  Location: [facility];  Service: Orthopedics;  Laterality: Left;  RNFA NEEDED   TONSILLECTOMY  age 74   TRIGGER FINGER RELEASE Right 01/28/2018   Procedure: RIGHT TRIGGER THUMB RELEASE;  Surgeon: Vickki Hearing, MD;  Location: [facility];  Service: Orthopedics;  Laterality: Right;   TRIGGER  FINGER RELEASE Right 02/17/2023   Procedure: RELEASE TRIGGER FINGER/A-1 PULLEY;  Surgeon: Vickki Hearing, MD;  Location: [facility];  Service: Orthopedics;  Laterality: Right;   TUBAL LIGATION     Patient Active Problem List   Diagnosis Date Noted   Need for pneumococcal 20-valent conjugate vaccination 02/19/2023   Trigger finger, right middle finger 02/17/2023   Need for influenza vaccination 11/18/2022   Controlled type 2 diabetes mellitus without complication, without long-term current use of insulin (HCC) 09/30/2022    Rotator cuff arthropathy of left shoulder 09/08/2022   Essential hypertension 09/03/2022   GERD (gastroesophageal reflux disease) 09/03/2022   Hyperlipidemia 09/03/2022   Osteoporosis 09/03/2022   Chronic musculoskeletal pain 09/03/2022   Rotator cuff arthropathy, left 09/03/2022   Screening for lung cancer 09/03/2022   Early satiety 02/22/2018   Loss of weight 02/22/2018   S/P trigger finger release right thumb 11/141/9 02/08/2018   Diarrhea 11/12/2016   Abdominal pain 11/12/2016   Nausea without vomiting 11/12/2016    PCP: Dr. Christel Mormon REFERRING PROVIDER: Dr. Thane Edu  ONSET DATE: 09/08/22  REFERRING DIAG: s/p left reverse TSA  THERAPY DIAG:  Stiffness of left shoulder, not elsewhere classified  Acute pain of left shoulder  Other symptoms and signs involving the musculoskeletal system  Rationale for Evaluation and Treatment: Rehabilitation  SUBJECTIVE:   SUBJECTIVE STATEMENT: S: I think I'm doing better  PERTINENT HISTORY: Pt is a 77 y/o female s/p left reverse TSA on 09/08/22. Pt received HH therapy for several weeks, stopped approximately 1 week ago.   PRECAUTIONS: Shoulder See Protocol  WEIGHT BEARING RESTRICTIONS: Yes >5lbs  PAIN:  Are you having pain? No   FALLS: Has patient fallen in last 6 months? Yes. Number of falls 1  PLOF: Independent  PATIENT GOALS: To be able to use the LUE.   NEXT MD VISIT: 03/03/23  OBJECTIVE:   HAND DOMINANCE: Right  ADLs: Overall ADLs: Pt reports she is having difficulty moving her arm for most tasks. Pt has difficulty with reaching up overhead and behind her back. She is able to dress herself, is unable to reach back behind her for pulling pants or underwear over hips. Pt is not lifting anything of weight, does not lift pots and pans. Pt is sleeping in the bed, on average sleeps ok, has difficulty when she is hurting.   FUNCTIONAL OUTCOME MEASURES: FOTO: 50/100 9/18: 56/100  UPPER EXTREMITY ROM:        Assessed in supine, er/IR adducted  Passive ROM Left eval Left 12/03/22  Shoulder flexion 120 141  Shoulder abduction 142 145  Shoulder internal rotation 90 90  Shoulder external rotation 35 45  (Blank rows = not tested)  Assessed in seated, er/IR adducted  Active ROM Left eval Left 12/03/22 Left 12/16/22 Left  01/06/23 Left 01/29/23  Shoulder flexion 102 105 114 120 148  Shoulder abduction 76 112 112 109 112  Shoulder internal rotation 90 90 90 90 90  Shoulder external rotation 0 6 36 40 38  (Blank rows = not tested)    UPPER EXTREMITY MMT:     Assessed in sitting, er/IR adducted-via observation on eval  9/18: Assessed in sitting, er/IR adducted  MMT Left eval Left 12/03/22 Left 12/16/22 Left 01/06/23 Left 01/29/23  Shoulder flexion 3-/5 4-/5 4/5 4/5 4/5  Shoulder abduction 3-/5 4-/5 4/5 4/5 4+/5  Shoulder internal rotation 3/5 4/5 4+/5 5/5 5/5  Shoulder external rotation 3-/5 3-/5 4-/5 4/5 4+/5  (Blank rows = not tested)  EDEMA: occasional with weather changes  OBSERVATIONS: mod fascial restrictions along upper arm, anterior shoulder, and trapezius regions   TODAY'S TREATMENT:                                                                                                                              DATE:   02/24/23 -UBE: Level 1, 4' forwards -A/ROM: standing, flexion, abduction, protraction, horizontal abduction, er/IR, x12 -Wall Slides: abduction, x10 -Shoulder strengthening: red band, horizontal abduction, er, IR, flexion, abduction, x15 -Scapular Strengthening: red band, extension, retraction, rows, x15 -Functional Reaching: 10 cones in flexion up to bottom shelf and back down  02/19/23 -Manual Therapy: myofascial release and trigger point applied to the bicep, deltoid, trapezius, scapular region and axillary region in order to reduce fascial restrictions and pain in order to improve ROM. -A/ROM: standing, flexion, abduction, protraction, horizontal  abduction, er/IR, x12 -ABC's in the air -Proximal Shoulder Strengthening: paddles, criss cross, circles both directions, x12 each -Shoulder Strengthening: 2#, flexion, abduction, protraction, horizontal abduction, er/IR, x10  02/16/23 -Manual Therapy: myofascial release and trigger point applied to the bicep, deltoid, trapezius, scapular region and axillary region in order to reduce fascial restrictions and pain in order to improve ROM. -A/ROM: side lying, flexion, abduction, protraction, horizontal abduction, er/IR, x12 -Shoulder Strengthening: side lying, 2# weight,  flexion, abduction, protraction, horizontal abduction, er/IR, x10 -A/ROM: standing, flexion, abduction, protraction, horizontal abduction, er/IR, x12   PATIENT EDUCATION: Education details: shoulder strengthening - band Person educated: Patient Education method: Programmer, multimedia, Demonstration, and Handouts Education comprehension: verbalized understanding and returned demonstration  HOME EXERCISE PROGRAM: Eval: AA/ROM 9/4: A/ROM 9/10: Wall Slides 9/18: shoulder stretches 10/17: scapular strengthening-red band 11/1: weighted ROM with water bottles/cans  11/11: Loop Band Exercises 12/10: shoulder strengthening- band  GOALS: Goals reviewed with patient? Yes   SHORT TERM GOALS: Target date: 12/05/22  Pt will be provided with and educated on HEP to improve mobility in LUE required for use during ADL completion.   Goal status: IN PROGRESS  2.  Pt will increase LUE A/ROM by 30+ degrees to improve ability to use LUE during dressing tasks with minimal compensatory techniques.   Goal status: IN PROGRESS  3.  Pt will increase LUE strength to 4/5 to improve ability to lift and move pots and pans during meal preparation and cleanup tasks.   Goal status: IN PROGRESS  4.  Pt will decrease pain in LUE to 3/10 or less to improve ability to sleep for 2+ consecutive hours without waking due to pain  Goal status: IN  PROGRESS  5.  Pt will decrease LUE fascial restrictions to min amounts or less to improve mobility required for functional reaching tasks.      Goal status: IN PROGRESS   ASSESSMENT:  CLINICAL IMPRESSION: Pt presenting with improved ROM and strength this session. She was able to complete multiple resistance band exercises and strength. Pt continues to demonstrate difficulty with external rotation and abduction, however with  assist of the wall slides she is able to reach 70% of full ROM with abduction. OT providing verbal and visual cuing for positioning and technique throughout session.   PERFORMANCE DEFICITS: in functional skills including in functional skills including ADLs, IADLs, coordination, tone, ROM, strength, pain, fascial restrictions, muscle spasms, and UE functional use   PLAN:  OT FREQUENCY: 2x/week  OT DURATION: 4 weeks  PLANNED INTERVENTIONS: self care/ADL training, therapeutic exercise, therapeutic activity, neuromuscular re-education, manual therapy, passive range of motion, splinting, electrical stimulation, ultrasound, moist heat, cryotherapy, patient/family education, and DME and/or AE instructions  CONSULTED AND AGREED WITH PLAN OF CARE: Patient  PLAN FOR NEXT SESSION: Follow up on HEP, shoulder strengthening exercises   Trish Mage, OTR/L [phone] 02/24/2023, 11:54 AM

## 2023-02-26 ENCOUNTER — Encounter (HOSPITAL_COMMUNITY): Payer: 59 | Admitting: Occupational Therapy

## 2023-03-02 DIAGNOSIS — M539 Dorsopathy, unspecified: Secondary | ICD-10-CM | POA: Diagnosis not present

## 2023-03-02 DIAGNOSIS — M1990 Unspecified osteoarthritis, unspecified site: Secondary | ICD-10-CM | POA: Diagnosis not present

## 2023-03-02 DIAGNOSIS — Z79899 Other long term (current) drug therapy: Secondary | ICD-10-CM | POA: Diagnosis not present

## 2023-03-02 DIAGNOSIS — Z9181 History of falling: Secondary | ICD-10-CM | POA: Diagnosis not present

## 2023-03-03 ENCOUNTER — Encounter: Payer: Self-pay | Admitting: Orthopedic Surgery

## 2023-03-03 ENCOUNTER — Ambulatory Visit (INDEPENDENT_AMBULATORY_CARE_PROVIDER_SITE_OTHER): Payer: 59 | Admitting: Orthopedic Surgery

## 2023-03-03 ENCOUNTER — Other Ambulatory Visit (INDEPENDENT_AMBULATORY_CARE_PROVIDER_SITE_OTHER): Payer: 59

## 2023-03-03 VITALS — BP 131/74 | HR 69 | Ht 59.0 in | Wt 134.8 lb

## 2023-03-03 DIAGNOSIS — Z96612 Presence of left artificial shoulder joint: Secondary | ICD-10-CM

## 2023-03-03 DIAGNOSIS — M65331 Trigger finger, right middle finger: Secondary | ICD-10-CM

## 2023-03-03 LAB — HM DIABETES EYE EXAM

## 2023-03-03 NOTE — Progress Notes (Signed)
Orthopaedic Postop Note  Assessment: Leah Lynch is a 77 y.o. female s/p Left Reverse Shoulder Arthroplasty  DOS: 09/08/2022  Plan: Leah Lynch has gotten better.  Her pain is much better.  She still working with therapy, and struggling with some strengthening and range of motion.  She also reports some numbness over the anterior lateral aspect of the left shoulder.  Her deltoid fires.  She is able to get her hand to her head.  Radiographs look excellent of the left shoulder.  Provided reassurance.  She may be a little bit behind, but she can see improvements in that left shoulder for up to a year following surgery.  She states understanding.  She also underwent right long finger trigger release with Dr. Romeo Apple on December 3.  Incision is healing very well.  Sutures were removed in clinic today.  I updated Dr. Romeo Apple.  I recommended that she return to see Dr. Romeo Apple in about a month.   Follow-up: Return in about 3 months (around 06/01/2023).  XR at next visit: Left shoulder  Subjective:  Chief Complaint  Patient presents with   Routine Post Op    L RSA DOS: 09/08/2022    History of Present Illness: Leah Lynch is a 77 y.o. female who presents following the above stated procedure.  Surgery was approximately 6 months ago.  She continues to work with physical therapy.  She is going to therapy twice a week.  She is also doing exercises on her own.  She denies pain in the left shoulder.  She notes occasional popping.  She does continue to have some weakness, and limitations in her range of motion.  No issues with her surgical incision, but she does have some numbness just lateral to the incision and over the lateral shoulder.  She is pleased with the improvements in her pain, but still has some issues with overall function.  Review of Systems: No fevers or chills No numbness or tingling No Chest Pain No shortness of breath   Objective: BP 131/74   Pulse 69   Ht  4\' 11"  (1.499 m)   Wt 134 lb 12.8 oz (61.1 kg)   BMI 27.23 kg/m   Physical Exam:  Alert and oriented.  No acute distress.  Surgical incision is healed.  No surrounding erythema or drainage.  She has some numbness and decreased sensation just lateral to the incision, extending over the lateral shoulder.  Her deltoid fires, with 100 degrees of abduction.  140 degrees of active forward flexion.  She easily gets her hand to her head.  Internal rotation to the sacrum.  External rotation is limited to approximately 15 degrees.  Right hand surgical incision is healing.  No surrounding erythema or drainage.  Sutures are ready to be removed.    IMAGING: I personally ordered and reviewed the following images:  X-rays of the left shoulder were obtained in clinic today.  These are compared to available x-rays.  No change in overall alignment of the left reverse shoulder arthroplasty.  There is no subsidence.  No lucency around the hardware.  Glenohumeral joint is reduced.  Glenosphere is in good position.  No signs of loosening.  No lucency around the screws of the baseplate.  : Left reverse shoulder arthroplasty in stable position, without subsidence or hardware failure  Oliver Barre, MD 03/03/2023 11:14 AM

## 2023-03-03 NOTE — Patient Instructions (Signed)
1 month with Dr. Romeo Apple  3 months with Dr. Dallas Schimke

## 2023-03-04 ENCOUNTER — Ambulatory Visit (HOSPITAL_COMMUNITY): Payer: 59 | Admitting: Occupational Therapy

## 2023-03-04 ENCOUNTER — Encounter (HOSPITAL_COMMUNITY): Payer: Self-pay | Admitting: Occupational Therapy

## 2023-03-04 DIAGNOSIS — M25612 Stiffness of left shoulder, not elsewhere classified: Secondary | ICD-10-CM | POA: Diagnosis not present

## 2023-03-04 DIAGNOSIS — M25512 Pain in left shoulder: Secondary | ICD-10-CM | POA: Diagnosis not present

## 2023-03-04 DIAGNOSIS — R29898 Other symptoms and signs involving the musculoskeletal system: Secondary | ICD-10-CM

## 2023-03-04 NOTE — Therapy (Signed)
OUTPATIENT OCCUPATIONAL THERAPY ORTHO TREATMENT NOTE  Patient Name: Leah Lynch MRN: [id number] DOB:[birth year], 77 y.o., female Today's Date: 03/04/2023  OCCUPATIONAL THERAPY DISCHARGE SUMMARY  Visits from Start of Care: 28  Current functional level related to goals / functional outcomes: Pt has met all of her OT goals. ROM is improved over 40 degrees, strength is 4/5 and better, pain remains minimal, and fascial restrictions are minimal.    Remaining deficits: No remaining deficits.    Education / Equipment: Pt has been provided a comprehensive HEP.    Plan: Patient agrees to discharge as overall strength and ROM are maintaining.       END OF SESSION:  OT End of Session - 03/04/23 1257     Visit Number 28    Number of Visits 29    Date for OT Re-Evaluation 03/06/23    Authorization Type [employer] Medicare Dual Complete    Progress Note Due on Visit 25    OT Start Time 1140    OT Stop Time 1230    OT Time Calculation (min) 50 min    Activity Tolerance Patient tolerated treatment well    Behavior During Therapy WFL for tasks assessed/performed              Past Medical History:  Diagnosis Date   Fibrocystic breast    GERD (gastroesophageal reflux disease)    Hyperlipidemia    Hypertension    Osteoarthritis    Osteoporosis    PONV (postoperative nausea and vomiting)    Pre-diabetes    Shortness of breath dyspnea    Past Surgical History:  Procedure Laterality Date   ABDOMINAL HYSTERECTOMY     BIOPSY  04/15/2018   Procedure: BIOPSY;  Surgeon: Corbin Ade, MD;  Location: [facility];  Service: Endoscopy;;  colon   BREAST CYST EXCISION Right    BREAST SURGERY     removal of cyst from right breast-fibrocystsic   BUNIONECTOMY  12/19/2010   Procedure: BUNIONECTOMY;  Surgeon: Dallas Schimke;  Location: [facility];  Service: Orthopedics;  Laterality: Right;  [name], [name] Bunionectomy   BUNIONECTOMY  05/22/2011   Procedure:  Arbutus Leas;  Surgeon: Dallas Schimke, DPM;  Location: [facility];  Service: Orthopedics;  Laterality: Left;  [name] Bunionectomy Left Foot   CAPSULOTOMY  01/29/2012   Procedure: CAPSULOTOMY;  Surgeon: Dallas Schimke, DPM;  Location: [facility];  Service: Orthopedics;  Laterality: Left;   CATARACT EXTRACTION W/PHACO Right 08/09/2015   Procedure: CATARACT EXTRACTION PHACO AND INTRAOCULAR LENS PLACEMENT RIGHT EYE CDE=7.95;  Surgeon: Gemma Payor, MD;  Location: [facility];  Service: Ophthalmology;  Laterality: Right;   CATARACT EXTRACTION W/PHACO Left 08/27/2015   Procedure: CATARACT EXTRACTION PHACO AND INTRAOCULAR LENS PLACEMENT LEFT EYE; CDE:  6.12;  Surgeon: Gemma Payor, MD;  Location: [facility];  Service: Ophthalmology;  Laterality: Left;   COLONOSCOPY  2012   Dr. Jena Gauss: internal hemorrhoids, hyperplastic polyps. Surveillance 2022   COLONOSCOPY WITH PROPOFOL N/A 04/15/2018   one 7 mm polyp at splenic flexure, one 4 mm polyp in rectum, otherwise normal. S/p segmental biopsy:normal. Normal TI   DIAGNOSTIC LAPAROSCOPY     ESOPHAGOGASTRODUODENOSCOPY (EGD) WITH PROPOFOL N/A 04/15/2018   normal   FOOT GANGLION EXCISION     right foot   METATARSAL OSTEOTOMY  05/22/2011   Procedure: METATARSAL OSTEOTOMY;  Surgeon: Dallas Schimke, DPM;  Location: [facility];  Service: Orthopedics;  Laterality: Left;  [name] Osteotomy Left Foot   POLYPECTOMY  04/15/2018  Procedure: POLYPECTOMY;  Surgeon: Corbin Ade, MD;  [facility]: [facility];  Service: Endoscopy;;  colon   REVERSE SHOULDER ARTHROPLASTY Left 09/08/2022   Procedure: LEFT REVERSE SHOULDER ARTHROPLASTY;  Surgeon: Oliver Barre, MD;  Location: [facility];  Service: Orthopedics;  Laterality: Left;  RNFA NEEDED   TONSILLECTOMY  age 54   TRIGGER FINGER RELEASE Right 01/28/2018   Procedure: RIGHT TRIGGER THUMB RELEASE;  Surgeon: Vickki Hearing, MD;  Location: [facility];  Service: Orthopedics;  Laterality: Right;   TRIGGER FINGER RELEASE Right  02/17/2023   Procedure: RELEASE TRIGGER FINGER/A-1 PULLEY;  Surgeon: Vickki Hearing, MD;  Location: [facility];  Service: Orthopedics;  Laterality: Right;   TUBAL LIGATION     Patient Active Problem List   Diagnosis Date Noted   Need for pneumococcal 20-valent conjugate vaccination 02/19/2023   Trigger finger, right middle finger 02/17/2023   Need for influenza vaccination 11/18/2022   Controlled type 2 diabetes mellitus without complication, without long-term current use of insulin (HCC) 09/30/2022   Rotator cuff arthropathy of left shoulder 09/08/2022   Essential hypertension 09/03/2022   GERD (gastroesophageal reflux disease) 09/03/2022   Hyperlipidemia 09/03/2022   Osteoporosis 09/03/2022   Chronic musculoskeletal pain 09/03/2022   Rotator cuff arthropathy, left 09/03/2022   Screening for lung cancer 09/03/2022   Early satiety 02/22/2018   Loss of weight 02/22/2018   S/P trigger finger release right thumb 11/141/9 02/08/2018   Diarrhea 11/12/2016   Abdominal pain 11/12/2016   Nausea without vomiting 11/12/2016    PCP: Dr. Christel Mormon REFERRING PROVIDER: Dr. Thane Edu  ONSET DATE: 09/08/22  REFERRING DIAG: s/p left reverse TSA  THERAPY DIAG:  Stiffness of left shoulder, not elsewhere classified  Acute pain of left shoulder  Other symptoms and signs involving the musculoskeletal system  Rationale for Evaluation and Treatment: Rehabilitation  SUBJECTIVE:   SUBJECTIVE STATEMENT: S: The doctor is saying my pain is normal.   PERTINENT HISTORY: Pt is a 77 y/o female s/p left reverse TSA on 09/08/22. Pt received HH therapy for several weeks, stopped approximately 1 week ago.   PRECAUTIONS: Shoulder See Protocol  WEIGHT BEARING RESTRICTIONS: Yes >5lbs  PAIN:  Are you having pain? No   FALLS: Has patient fallen in last 6 months? Yes. Number of falls 1  PLOF: Independent  PATIENT GOALS: To be able to use the LUE.   NEXT MD VISIT: 03/03/23  OBJECTIVE:    HAND DOMINANCE: Right  ADLs: Overall ADLs: Pt reports she is having difficulty moving her arm for most tasks. Pt has difficulty with reaching up overhead and behind her back. She is able to dress herself, is unable to reach back behind her for pulling pants or underwear over hips. Pt is not lifting anything of weight, does not lift pots and pans. Pt is sleeping in the bed, on average sleeps ok, has difficulty when she is hurting.   FUNCTIONAL OUTCOME MEASURES: FOTO: 50/100 9/18: 56/100 12/18:   UPPER EXTREMITY ROM:       Assessed in supine, er/IR adducted  Passive ROM Left eval Left 12/03/22  Shoulder flexion 120 141  Shoulder abduction 142 145  Shoulder internal rotation 90 90  Shoulder external rotation 35 45  (Blank rows = not tested)  Assessed in seated, er/IR adducted  Active ROM Left eval Left 12/03/22 Left 12/16/22 Left  01/06/23 Left 01/29/23 Left 03/04/23  Shoulder flexion 102 105 114 120 148 145  Shoulder abduction 76 112 112 109 112 120  Shoulder internal rotation 90 90 90 90 90 90  Shoulder external rotation 0 6 36 40 38 38  (Blank rows = not tested)    UPPER EXTREMITY MMT:     Assessed in sitting, er/IR adducted-via observation on eval  9/18: Assessed in sitting, er/IR adducted  MMT Left eval Left 12/03/22 Left 12/16/22 Left 01/06/23 Left 01/29/23 Left 03/04/23  Shoulder flexion 3-/5 4-/5 4/5 4/5 4/5 4/5  Shoulder abduction 3-/5 4-/5 4/5 4/5 4+/5 4+/5  Shoulder internal rotation 3/5 4/5 4+/5 5/5 5/5 5/5  Shoulder external rotation 3-/5 3-/5 4-/5 4/5 4+/5 4/5  (Blank rows = not tested)  EDEMA: occasional with weather changes  OBSERVATIONS: mod fascial restrictions along upper arm, anterior shoulder, and trapezius regions   TODAY'S TREATMENT:                                                                                                                              DATE:   03/04/23 -AA/ROM: sitting, flexion, abduction, protraction,  horizontal abduction, er/IR, x15 -A/ROM: sitting, flexion, abduction, protraction, horizontal abduction, er/IR, x12 -Shoulder Strengthening: red band, horizontal abduction, er, IR, flexion, abduction, x15 -Scapular Strengthening: red band, extension, retraction, rows, x15 -Reassessment for Discharge  02/24/23 -UBE: Level 1, 4' forwards -A/ROM: standing, flexion, abduction, protraction, horizontal abduction, er/IR, x12 -Wall Slides: abduction, x10 -Shoulder strengthening: red band, horizontal abduction, er, IR, flexion, abduction, x15 -Scapular Strengthening: red band, extension, retraction, rows, x15 -Functional Reaching: 10 cones in flexion up to bottom shelf and back down  02/19/23 -Manual Therapy: myofascial release and trigger point applied to the bicep, deltoid, trapezius, scapular region and axillary region in order to reduce fascial restrictions and pain in order to improve ROM. -A/ROM: standing, flexion, abduction, protraction, horizontal abduction, er/IR, x12 -ABC's in the air -Proximal Shoulder Strengthening: paddles, criss cross, circles both directions, x12 each -Shoulder Strengthening: 2#, flexion, abduction, protraction, horizontal abduction, er/IR, x10  02/16/23 -Manual Therapy: myofascial release and trigger point applied to the bicep, deltoid, trapezius, scapular region and axillary region in order to reduce fascial restrictions and pain in order to improve ROM. -A/ROM: side lying, flexion, abduction, protraction, horizontal abduction, er/IR, x12 -Shoulder Strengthening: side lying, 2# weight,  flexion, abduction, protraction, horizontal abduction, er/IR, x10 -A/ROM: standing, flexion, abduction, protraction, horizontal abduction, er/IR, x12   PATIENT EDUCATION: Education details: Reviewed HEP Person educated: Patient Education method: Explanation, Demonstration, and Handouts Education comprehension: verbalized understanding and returned demonstration  HOME EXERCISE  PROGRAM: Eval: AA/ROM 9/4: A/ROM 9/10: Wall Slides 9/18: shoulder stretches 10/17: scapular strengthening-red band 11/1: weighted ROM with water bottles/cans  11/11: Loop Band Exercises 12/10: shoulder strengthening- band  GOALS: Goals reviewed with patient? Yes   SHORT TERM GOALS: Target date: 12/05/22  Pt will be provided with and educated on HEP to improve mobility in LUE required for use during ADL completion.   Goal status: MET  2.  Pt will increase LUE A/ROM by 30+ degrees to improve ability to  use LUE during dressing tasks with minimal compensatory techniques.   Goal status: MET  3.  Pt will increase LUE strength to 4/5 to improve ability to lift and move pots and pans during meal preparation and cleanup tasks.   Goal status: MET  4.  Pt will decrease pain in LUE to 3/10 or less to improve ability to sleep for 2+ consecutive hours without waking due to pain  Goal status: MET  5.  Pt will decrease LUE fascial restrictions to min amounts or less to improve mobility required for functional reaching tasks.      Goal status: MET   ASSESSMENT:  CLINICAL IMPRESSION: Pt completing reassessment this session for discharge. She is demonstrating continued limitations with ROM, however she has met her overall shoulder goal. Additionally, she continues to demonstrate weakness in most motions, rating around 4/5 and higher. Pt has no further skilled OT needs at this time and will be discharged from OP OT.   PERFORMANCE DEFICITS: in functional skills including in functional skills including ADLs, IADLs, coordination, tone, ROM, strength, pain, fascial restrictions, muscle spasms, and UE functional use   PLAN:  OT FREQUENCY: 2x/week  OT DURATION: 4 weeks  PLANNED INTERVENTIONS: self care/ADL training, therapeutic exercise, therapeutic activity, neuromuscular re-education, manual therapy, passive range of motion, splinting, electrical stimulation, ultrasound, moist heat,  cryotherapy, patient/family education, and DME and/or AE instructions  CONSULTED AND AGREED WITH PLAN OF CARE: Patient  PLAN FOR NEXT SESSION: Follow up on HEP, shoulder strengthening exercises   Trish Mage, OTR/L [phone] 03/04/2023, 12:58 PM

## 2023-03-05 ENCOUNTER — Encounter: Payer: 59 | Admitting: Orthopedic Surgery

## 2023-03-19 ENCOUNTER — Other Ambulatory Visit: Payer: Self-pay | Admitting: Internal Medicine

## 2023-03-19 DIAGNOSIS — E119 Type 2 diabetes mellitus without complications: Secondary | ICD-10-CM

## 2023-03-19 DIAGNOSIS — E782 Mixed hyperlipidemia: Secondary | ICD-10-CM

## 2023-03-22 ENCOUNTER — Encounter: Payer: Self-pay | Admitting: Orthopedic Surgery

## 2023-03-22 DIAGNOSIS — Z96612 Presence of left artificial shoulder joint: Secondary | ICD-10-CM

## 2023-03-22 DIAGNOSIS — M12812 Other specific arthropathies, not elsewhere classified, left shoulder: Secondary | ICD-10-CM

## 2023-03-31 ENCOUNTER — Ambulatory Visit (HOSPITAL_COMMUNITY): Payer: 59 | Attending: Orthopedic Surgery | Admitting: Occupational Therapy

## 2023-03-31 ENCOUNTER — Other Ambulatory Visit: Payer: Self-pay

## 2023-03-31 ENCOUNTER — Encounter (HOSPITAL_COMMUNITY): Payer: Self-pay | Admitting: Occupational Therapy

## 2023-03-31 DIAGNOSIS — Z96612 Presence of left artificial shoulder joint: Secondary | ICD-10-CM | POA: Insufficient documentation

## 2023-03-31 DIAGNOSIS — M12812 Other specific arthropathies, not elsewhere classified, left shoulder: Secondary | ICD-10-CM | POA: Insufficient documentation

## 2023-03-31 DIAGNOSIS — R29898 Other symptoms and signs involving the musculoskeletal system: Secondary | ICD-10-CM | POA: Diagnosis not present

## 2023-03-31 DIAGNOSIS — M79644 Pain in right finger(s): Secondary | ICD-10-CM | POA: Diagnosis not present

## 2023-03-31 DIAGNOSIS — M25612 Stiffness of left shoulder, not elsewhere classified: Secondary | ICD-10-CM | POA: Insufficient documentation

## 2023-03-31 DIAGNOSIS — M25512 Pain in left shoulder: Secondary | ICD-10-CM | POA: Diagnosis not present

## 2023-03-31 NOTE — Patient Instructions (Signed)
  1) Flexion Wall Stretch    Face wall, place affected handon wall in front of you. Slide hand up the wall  and lean body in towards the wall. Hold for 15 seconds. Repeat 3-5 times. 1-2 times/day.     2) Towel Stretch with Internal Rotation      Gently pull up (or to the side) your affected arm  behind your back with the assist of a towel. Hold 15 seconds, repeat 3-5 times. 1-2 times/day.             3) Corner Stretch    Stand at a corner of a wall, place your arms on the walls with elbows bent. Lean into the corner until a stretch is felt along the front of your chest and/or shoulders. Hold for 15 seconds. Repeat 3-5X, 1-2 times/day.    4) Posterior Capsule Stretch    Bring the involved arm across chest. Grasp elbow and pull toward chest until you feel a stretch in the back of the upper arm and shoulder. Hold 15 seconds. Repeat 3-5X. Complete 1-2 times/day.    5) External Rotation Stretch:    Place your affected hand on the wall with the elbow bent and gently turn your body the opposite direction until a stretch is felt. Hold 15 seconds, repeat 3-5X. Complete 1-2 times/day.   6) Shoulder Abduction Stretch:       Face wall, place affected hand on wall in front of you. Slide hand up the wall  and lean body in towards the wall. Hold for 15 seconds. Repeat 3-5 times. 1-2 times/day.

## 2023-03-31 NOTE — Therapy (Signed)
 OUTPATIENT OCCUPATIONAL THERAPY ORTHO EVALUATION  Patient Name: Leah Lynch MRN: [id number] DOB:[birth year], 78 y.o., female Today's Date: 03/31/2023   END OF SESSION:  OT End of Session - 03/31/23 0919     Visit Number 1    Number of Visits 4    Date for OT Re-Evaluation 04/30/23    Authorization Type 1) UHC Medicare Dual Complete  2) [place] Medicaid    Authorization Time Period no visit limit    Progress Note Due on Visit 10    OT Start Time [phone]    OT Stop Time 0914    OT Time Calculation (min) 41 min    Activity Tolerance Patient tolerated treatment well    Behavior During Therapy WFL for tasks assessed/performed               Past Medical History:  Diagnosis Date   Fibrocystic breast    GERD (gastroesophageal reflux disease)    Hyperlipidemia    Hypertension    Osteoarthritis    Osteoporosis    PONV (postoperative nausea and vomiting)    Pre-diabetes    Shortness of breath dyspnea    Past Surgical History:  Procedure Laterality Date   ABDOMINAL HYSTERECTOMY     BIOPSY  04/15/2018   Procedure: BIOPSY;  Surgeon: Shaaron Lamar HERO, MD;  [facility];  Service: Endoscopy;;  colon   BREAST CYST EXCISION Right    BREAST SURGERY     removal of cyst from right breast-fibrocystsic   BUNIONECTOMY  12/19/2010   Procedure: BUNIONECTOMY;  Surgeon: Morene Donley Anon;  Location: [facility];  Service: Orthopedics;  Laterality: Right;  Massie Edwards, [name] Bunionectomy   BUNIONECTOMY  05/22/2011   Procedure: EDWARDS;  Surgeon: Morene Donley Anon, DPM;  Location: [facility];  Service: Orthopedics;  Laterality: Left;  [name] Bunionectomy Left Foot   CAPSULOTOMY  01/29/2012   Procedure: CAPSULOTOMY;  Surgeon: Morene Donley Anon, DPM;  Location: [facility];  Service: Orthopedics;  Laterality: Left;   CATARACT EXTRACTION W/PHACO Right 08/09/2015   Procedure: CATARACT EXTRACTION PHACO AND INTRAOCULAR LENS PLACEMENT RIGHT EYE CDE=7.95;  Surgeon: Cherene Mania, MD;  Location: [facility];  Service: Ophthalmology;  Laterality: Right;   CATARACT EXTRACTION W/PHACO Left 08/27/2015   Procedure: CATARACT EXTRACTION PHACO AND INTRAOCULAR LENS PLACEMENT LEFT EYE; CDE:  6.12;  Surgeon: Cherene Mania, MD;  Location: [facility];  Service: Ophthalmology;  Laterality: Left;   COLONOSCOPY  2012   Dr. Shaaron: internal hemorrhoids, hyperplastic polyps. Surveillance 2022   COLONOSCOPY WITH PROPOFOL  N/A 04/15/2018   one 7 mm polyp at splenic flexure, one 4 mm polyp in rectum, otherwise normal. S/p segmental biopsy:normal. Normal TI   DIAGNOSTIC LAPAROSCOPY     ESOPHAGOGASTRODUODENOSCOPY (EGD) WITH PROPOFOL  N/A 04/15/2018   normal   FOOT GANGLION EXCISION     right foot   METATARSAL OSTEOTOMY  05/22/2011   Procedure: METATARSAL OSTEOTOMY;  Surgeon: Morene Donley Anon, DPM;  [facility];  Service: Orthopedics;  Laterality: Left;  [name] Osteotomy Left Foot   POLYPECTOMY  04/15/2018   Procedure: POLYPECTOMY;  Surgeon: Shaaron Lamar HERO, MD;  [facility];  Service: Endoscopy;;  colon   REVERSE SHOULDER ARTHROPLASTY Left 09/08/2022   Procedure: LEFT REVERSE SHOULDER ARTHROPLASTY;  Surgeon: Onesimo Oneil LABOR, MD;  Location: [facility];  Service: Orthopedics;  Laterality: Left;  RNFA NEEDED   TONSILLECTOMY  age 21   TRIGGER FINGER RELEASE Right 01/28/2018   Procedure: RIGHT TRIGGER THUMB RELEASE;  Surgeon: Margrette Bars  E, MD;  Location: [facility];  Service: Orthopedics;  Laterality: Right;   TRIGGER FINGER RELEASE Right 02/17/2023   Procedure: RELEASE TRIGGER FINGER/A-1 PULLEY;  Surgeon: Margrette Taft BRAVO, MD;  Location: [facility];  Service: Orthopedics;  Laterality: Right;   TUBAL LIGATION     Patient Active Problem List   Diagnosis Date Noted   Need for pneumococcal 20-valent conjugate vaccination 02/19/2023   Trigger finger, right middle finger 02/17/2023   Need for influenza vaccination 11/18/2022   Controlled type 2 diabetes mellitus without complication,  without long-term current use of insulin (HCC) 09/30/2022   Rotator cuff arthropathy of left shoulder 09/08/2022   Essential hypertension 09/03/2022   GERD (gastroesophageal reflux disease) 09/03/2022   Hyperlipidemia 09/03/2022   Osteoporosis 09/03/2022   Chronic musculoskeletal pain 09/03/2022   Rotator cuff arthropathy, left 09/03/2022   Screening for lung cancer 09/03/2022   Early satiety 02/22/2018   Loss of weight 02/22/2018   S/P trigger finger release right thumb 11/141/9 02/08/2018   Diarrhea 11/12/2016   Abdominal pain 11/12/2016   Nausea without vomiting 11/12/2016    PCP: Dr. Manus Fireman REFERRING PROVIDER: Dr. Oneil Horde  ONSET DATE: 09/08/22  REFERRING DIAG: s/p left reverse TSA  THERAPY DIAG:  Stiffness of left shoulder, not elsewhere classified  Acute pain of left shoulder  Other symptoms and signs involving the musculoskeletal system  Rationale for Evaluation and Treatment: Rehabilitation  SUBJECTIVE:   SUBJECTIVE STATEMENT: S: I asked for more therapy because I still cannot do some things.   PERTINENT HISTORY: Pt is a 78 y/o female s/p left reverse TSA on 09/08/22. Pt received HH therapy for several weeks, received OP services from 10/2022-02/2023.   PRECAUTIONS: Shoulder See Protocol  WEIGHT BEARING RESTRICTIONS: Yes >5lbs  PAIN:  Are you having pain? No   FALLS: Has patient fallen in last 6 months? Yes. Number of falls 1  PLOF: Independent  PATIENT GOALS: To be able to use the LUE.   NEXT MD VISIT: 04/03/23  OBJECTIVE:   HAND DOMINANCE: Right  ADLs: Overall ADLs: Pt is having difficulty fixing her hair as she cannot lift her hand over her head to the top and the back. Reaching behind her back is difficult to tuck a shirt in or thread a belt. Lifting pots and pans is difficult to complete. Sleep is good now.   FUNCTIONAL OUTCOME MEASURES: FOTO: 56/100   UPPER EXTREMITY ROM:     Assessed in seated, er/IR adducted  Active ROM  Left 03/04/23 Left 03/31/23   Shoulder flexion 145 110 (right 125)  Shoulder abduction 120 129 (right 135)  Shoulder internal rotation 90 90  Shoulder external rotation 38 21 (right 48)  (Blank rows = not tested)    UPPER EXTREMITY MMT:     03/31/23: Assessed in sitting, er/IR adducted  MMT Left 03/04/23 Left 03/31/23  Shoulder flexion 4/5 4+/5  Shoulder abduction 4+/5 4+/5  Shoulder internal rotation 5/5 5/5  Shoulder external rotation 4/5 4-/5  (Blank rows = not tested)  EDEMA: occasional with weather changes  OBSERVATIONS: mod fascial restrictions along upper arm, anterior shoulder, and trapezius regions   TODAY'S TREATMENT:  DATE:  03/31/23 -Shoulder stretches: flexion, IR behind back with horizontal towel, doorway stretch, posterior capsule stretch, er stretch, abduction stretch, 2x15 holds    PATIENT EDUCATION: Education details: shoulder stretches Person educated: Patient Education method: Explanation, Demonstration, and Handouts Education comprehension: verbalized understanding and returned demonstration  HOME EXERCISE PROGRAM: Eval: shoulder stretches  GOALS: Goals reviewed with patient? Yes   SHORT TERM GOALS: Target date: 12/05/22  Pt will be provided with and educated on HEP to improve mobility in LUE required for use during ADL completion.   Goal status: INITIAL  2.  Pt will increase LUE A/ROM by 20+ degrees to improve ability to use LUE for washing and fixing hair and reaching behind back during ADLs.   Goal status: INITIAL  3.  Pt will increase LUE strength to 4+/5 to improve ability to lift and move pots and pans during meal preparation and cleanup tasks.   Goal status: INITIAL  4.  Pt will decrease pain in LUE to 3/10 or less to improve ability to perform functional reaching tasks during housework tasks.   Goal  status: INITIAL   ASSESSMENT:  CLINICAL IMPRESSION: Pt returns for OT evaluation today, previously discharged on 03/04/23 after meeting her goals. Pt reports continued difficulty with functional reaching tasks during ADLs, primarily with er/IR and overhead reaching. Pt noted to have regressed in some ROM since discharge, er strength has also declined. Discussed expected limitations post reverse TSA, also discussed HEP and pt is completing a variety of exercises, however they may not be the most appropriate ones for her deficits. Educated on shoulder stretches today, requested pt only focus on stretches and functional reaching around the house until next appt.    PERFORMANCE DEFICITS: in functional skills including in functional skills including ADLs, IADLs, coordination, tone, ROM, strength, pain, fascial restrictions, muscle spasms, and UE functional use    IMPAIRMENTS: are limiting patient from ADLs, IADLs, rest and sleep, and leisure.    COMORBIDITIES: has no other co-morbidities that affects occupational performance. Patient will benefit from skilled OT to address above impairments and improve overall function.   MODIFICATION OR ASSISTANCE TO COMPLETE EVALUATION: No modification of tasks or assist necessary to complete an evaluation.   OT OCCUPATIONAL PROFILE AND HISTORY: Problem focused assessment: Including review of records relating to presenting problem.   CLINICAL DECISION MAKING: LOW - limited treatment options, no task modification necessary   REHAB POTENTIAL: Good   EVALUATION COMPLEXITY: Low       PLAN:  OT FREQUENCY: 1x/week  OT DURATION: 4 weeks  PLANNED INTERVENTIONS: self care/ADL training, therapeutic exercise, therapeutic activity, neuromuscular re-education, manual therapy, passive range of motion, splinting, electrical stimulation, ultrasound, moist heat, cryotherapy, patient/family education, and DME and/or AE instructions  CONSULTED AND AGREED WITH PLAN OF  CARE: Patient  PLAN FOR NEXT SESSION: Follow up on HEP, complete A/ROM and functional reaching tasks   Sonny Cory, OTR/L  [phone] 03/31/2023, 9:20 AM

## 2023-04-01 DIAGNOSIS — Z79899 Other long term (current) drug therapy: Secondary | ICD-10-CM | POA: Diagnosis not present

## 2023-04-01 DIAGNOSIS — M539 Dorsopathy, unspecified: Secondary | ICD-10-CM | POA: Diagnosis not present

## 2023-04-02 NOTE — Progress Notes (Signed)
   There were no vitals taken for this visit.  There is no height or weight on file to calculate BMI.  Chief Complaint  Patient presents with   Post-op Follow-up    Right hand 02/17/23     Encounter Diagnosis  Name Primary?   Trigger finger, right middle finger 02/17/23 Yes    DOI/DOS/ Date: 02/17/23  Pain stiffness states something not right

## 2023-04-03 ENCOUNTER — Encounter: Payer: Self-pay | Admitting: Orthopedic Surgery

## 2023-04-03 ENCOUNTER — Ambulatory Visit (INDEPENDENT_AMBULATORY_CARE_PROVIDER_SITE_OTHER): Payer: 59 | Admitting: Orthopedic Surgery

## 2023-04-03 DIAGNOSIS — M65331 Trigger finger, right middle finger: Secondary | ICD-10-CM

## 2023-04-03 NOTE — Progress Notes (Signed)
Chief Complaint  Patient presents with   Post-op Follow-up      Right hand 02/17/23           Encounter Diagnosis  Name Primary?   Trigger finger, right middle finger 02/17/23 Yes      DOI/DOS/ Date: 02/17/23   Pain stiffness   Leah Lynch is having some stiffness in her surgical right middle finger with loss of full extension and she cannot fully flex she also has triggering of the index finger  It is just some scar tissue no signs of infection no triggering of the right middle finger  Recommend OT for 6 weeks follow-up 6 weeks

## 2023-04-08 ENCOUNTER — Ambulatory Visit (HOSPITAL_COMMUNITY): Payer: 59 | Admitting: Occupational Therapy

## 2023-04-08 ENCOUNTER — Encounter (HOSPITAL_COMMUNITY): Payer: 59 | Admitting: Occupational Therapy

## 2023-04-08 ENCOUNTER — Encounter (HOSPITAL_COMMUNITY): Payer: Self-pay | Admitting: Occupational Therapy

## 2023-04-08 ENCOUNTER — Telehealth: Payer: Self-pay | Admitting: Orthopedic Surgery

## 2023-04-08 DIAGNOSIS — Z96612 Presence of left artificial shoulder joint: Secondary | ICD-10-CM | POA: Diagnosis not present

## 2023-04-08 DIAGNOSIS — M25512 Pain in left shoulder: Secondary | ICD-10-CM

## 2023-04-08 DIAGNOSIS — M79644 Pain in right finger(s): Secondary | ICD-10-CM | POA: Diagnosis not present

## 2023-04-08 DIAGNOSIS — M25612 Stiffness of left shoulder, not elsewhere classified: Secondary | ICD-10-CM

## 2023-04-08 DIAGNOSIS — M12812 Other specific arthropathies, not elsewhere classified, left shoulder: Secondary | ICD-10-CM | POA: Diagnosis not present

## 2023-04-08 DIAGNOSIS — R29898 Other symptoms and signs involving the musculoskeletal system: Secondary | ICD-10-CM | POA: Diagnosis not present

## 2023-04-08 NOTE — Telephone Encounter (Signed)
They can not file her Medicaid I will have my manager check on this for her.

## 2023-04-08 NOTE — Therapy (Unsigned)
OUTPATIENT OCCUPATIONAL THERAPY ORTHO TREATMENT NOTE  Patient Name: Leah Lynch MRN: [id number] DOB:1945/08/19, 78 y.o., female Today's Date: 04/09/2023   END OF SESSION:  OT End of Session - 04/08/23 1520     Visit Number 2    Number of Visits 4    Date for OT Re-Evaluation 04/30/23    Authorization Type 1) UHC Medicare Dual Complete  2) [place] Medicaid    Authorization Time Period no visit limit    Progress Note Due on Visit 10    OT Start Time 1439    OT Stop Time 1520    OT Time Calculation (min) 41 min    Activity Tolerance Patient tolerated treatment well    Behavior During Therapy WFL for tasks assessed/performed             Past Medical History:  Diagnosis Date   Fibrocystic breast    GERD (gastroesophageal reflux disease)    Hyperlipidemia    Hypertension    Osteoarthritis    Osteoporosis    PONV (postoperative nausea and vomiting)    Pre-diabetes    Shortness of breath dyspnea    Past Surgical History:  Procedure Laterality Date   ABDOMINAL HYSTERECTOMY     BIOPSY  04/15/2018   Procedure: BIOPSY;  Surgeon: Corbin Ade, MD;  [facility];  Service: Endoscopy;;  colon   BREAST CYST EXCISION Right    BREAST SURGERY     removal of cyst from right breast-fibrocystsic   BUNIONECTOMY  12/19/2010   Procedure: BUNIONECTOMY;  Surgeon: Dallas Schimke;  Location: [facility];  Service: Orthopedics;  Laterality: Right;  [name], [name] Bunionectomy   BUNIONECTOMY  05/22/2011   Procedure: Arbutus Leas;  Surgeon: Dallas Schimke, DPM;  Location: [facility];  Service: Orthopedics;  Laterality: Left;  [name] Bunionectomy Left Foot   CAPSULOTOMY  01/29/2012   Procedure: CAPSULOTOMY;  Surgeon: Dallas Schimke, DPM;  Location[facility];  Service: Orthopedics;  Laterality: Left;   CATARACT EXTRACTION W/PHACO Right 08/09/2015   Procedure: CATARACT EXTRACTION PHACO AND INTRAOCULAR LENS PLACEMENT RIGHT EYE CDE=7.95;  Surgeon: Gemma Payor, MD;  [facility];  Service: Ophthalmology;  Laterality: Right;   CATARACT EXTRACTION W/PHACO Left 08/27/2015   Procedure: CATARACT EXTRACTION PHACO AND INTRAOCULAR LENS PLACEMENT LEFT EYE; CDE:  6.12;  Surgeon: Gemma Payor, MD;  Location: [facility];  Service: Ophthalmology;  Laterality: Left;   COLONOSCOPY  2012   Dr. Jena Gauss: internal hemorrhoids, hyperplastic polyps. Surveillance 2022   COLONOSCOPY WITH PROPOFOL N/A 04/15/2018   one 7 mm polyp at splenic flexure, one 4 mm polyp in rectum, otherwise normal. S/p segmental biopsy:normal. Normal TI   DIAGNOSTIC LAPAROSCOPY     ESOPHAGOGASTRODUODENOSCOPY (EGD) WITH PROPOFOL N/A 04/15/2018   normal   FOOT GANGLION EXCISION     right foot   METATARSAL OSTEOTOMY  05/22/2011   Procedure: METATARSAL OSTEOTOMY;  Surgeon: Dallas Schimke, DPM;  [facility];  Service: Orthopedics;  Laterality: Left;  [name] Osteotomy Left Foot   POLYPECTOMY  04/15/2018   Procedure: POLYPECTOMY;  Surgeon: Corbin Ade, MD;  [facility];  Service: Endoscopy;;  colon   REVERSE SHOULDER ARTHROPLASTY Left 09/08/2022   Procedure: LEFT REVERSE SHOULDER ARTHROPLASTY;  Surgeon: Oliver Barre, MD;  Location[facility];  Service: Orthopedics;  Laterality: Left;  RNFA NEEDED   TONSILLECTOMY  age 35   TRIGGER FINGER RELEASE Right 01/28/2018   Procedure: RIGHT TRIGGER THUMB RELEASE;  Surgeon: Vickki Hearing,  MD;  [facility]: [facility];  Service: Orthopedics;  Laterality: Right;   TRIGGER FINGER RELEASE Right 02/17/2023   Procedure: RELEASE TRIGGER FINGER/A-1 PULLEY;  Surgeon: Vickki Hearing, MD;  Location: [facility];  Service: Orthopedics;  Laterality: Right;   TUBAL LIGATION     Patient Active Problem List   Diagnosis Date Noted   Need for pneumococcal 20-valent conjugate vaccination 02/19/2023   Trigger finger, right middle finger 02/17/2023   Need for influenza vaccination 11/18/2022   Controlled type 2 diabetes mellitus without complication,  without long-term current use of insulin (HCC) 09/30/2022   Rotator cuff arthropathy of left shoulder 09/08/2022   Essential hypertension 09/03/2022   GERD (gastroesophageal reflux disease) 09/03/2022   Hyperlipidemia 09/03/2022   Osteoporosis 09/03/2022   Chronic musculoskeletal pain 09/03/2022   Rotator cuff arthropathy, left 09/03/2022   Screening for lung cancer 09/03/2022   Early satiety 02/22/2018   Loss of weight 02/22/2018   S/P trigger finger release right thumb 11/141/9 02/08/2018   Diarrhea 11/12/2016   Abdominal pain 11/12/2016   Nausea without vomiting 11/12/2016    PCP: Dr. Christel Mormon REFERRING PROVIDER: Dr. Thane Edu  ONSET DATE: 09/08/22  REFERRING DIAG: s/p left reverse TSA  THERAPY DIAG:  Stiffness of left shoulder, not elsewhere classified  Acute pain of left shoulder  Other symptoms and signs involving the musculoskeletal system  Rationale for Evaluation and Treatment: Rehabilitation  SUBJECTIVE:   SUBJECTIVE STATEMENT: S: I just feel weak.   PERTINENT HISTORY: Pt is a 78 y/o female s/p left reverse TSA on 09/08/22. Pt received HH therapy for several weeks, received OP services from 10/2022-02/2023.   PRECAUTIONS: Shoulder See Protocol  WEIGHT BEARING RESTRICTIONS: Yes >5lbs  PAIN:  Are you having pain? No   FALLS: Has patient fallen in last 6 months? Yes. Number of falls 1  PLOF: Independent  PATIENT GOALS: To be able to use the LUE.   NEXT MD VISIT: 04/03/23  OBJECTIVE:   HAND DOMINANCE: Right  ADLs: Overall ADLs: Pt is having difficulty fixing her hair as she cannot lift her hand over her head to the top and the back. Reaching behind her back is difficult to tuck a shirt in or thread a belt. Lifting pots and pans is difficult to complete. Sleep is good now.   FUNCTIONAL OUTCOME MEASURES: FOTO: 56/100   UPPER EXTREMITY ROM:     Assessed in seated, er/IR adducted  Active ROM Left 03/04/23 Left 03/31/23   Shoulder flexion  145 110 (right 125)  Shoulder abduction 120 129 (right 135)  Shoulder internal rotation 90 90  Shoulder external rotation 38 21 (right 48)  (Blank rows = not tested)    UPPER EXTREMITY MMT:     03/31/23: Assessed in sitting, er/IR adducted  MMT Left 03/04/23 Left 03/31/23  Shoulder flexion 4/5 4+/5  Shoulder abduction 4+/5 4+/5  Shoulder internal rotation 5/5 5/5  Shoulder external rotation 4/5 4-/5  (Blank rows = not tested)  EDEMA: occasional with weather changes  OBSERVATIONS: mod fascial restrictions along upper arm, anterior shoulder, and trapezius regions   TODAY'S TREATMENT:  DATE:   04/08/23 -Manual Therapy: myofascial release and trigger point applied to biceps, deltoid, trapezius, and scapular region in order to reduce fascial restrictions and pain, as well as improve ROM. -A/ROM: seated, flexion, abduction, protraction, horizontal abduction, er/IR, x10 -Shoulder stretches: flexion, IR behind back with horizontal towel, doorway stretch, posterior capsule stretch, er stretch, abduction stretch, 2x15" holds -Functional reaching: shoulder height and head height shelves, x10 each  03/31/23 -Shoulder stretches: flexion, IR behind back with horizontal towel, doorway stretch, posterior capsule stretch, er stretch, abduction stretch, 2x15" holds    PATIENT EDUCATION: Education details: seated A/ROM Person educated: Patient Education method: Programmer, multimedia, Facilities manager, and Handouts Education comprehension: verbalized understanding and returned demonstration  HOME EXERCISE PROGRAM: Eval: shoulder stretches 1/22: Seated A/ROM  GOALS: Goals reviewed with patient? Yes   SHORT TERM GOALS: Target date: 12/05/22  Pt will be provided with and educated on HEP to improve mobility in LUE required for use during ADL completion.   Goal status: IN  PROGRESS  2.  Pt will increase LUE A/ROM by 20+ degrees to improve ability to use LUE for washing and fixing hair and reaching behind back during ADLs.   Goal status: IN PROGRESS  3.  Pt will increase LUE strength to 4+/5 to improve ability to lift and move pots and pans during meal preparation and cleanup tasks.   Goal status: IN PROGRESS  4.  Pt will decrease pain in LUE to 3/10 or less to improve ability to perform functional reaching tasks during housework tasks.   Goal status: IN PROGRESS   ASSESSMENT:  CLINICAL IMPRESSION: This session, pt reviewing all shoulder stretches, demonstrating fair recall and positioning throughout. She also was able to add A/ROM with fair mobility, achieving approximately 60-65% of full ROM. During functional reaching, she continues to have max difficulty reaching into the shelf at head height due to muscle tension and limited ROM.   PERFORMANCE DEFICITS: in functional skills including in functional skills including ADLs, IADLs, coordination, tone, ROM, strength, pain, fascial restrictions, muscle spasms, and UE functional use   PLAN:  OT FREQUENCY: 1x/week  OT DURATION: 4 weeks  PLANNED INTERVENTIONS: self care/ADL training, therapeutic exercise, therapeutic activity, neuromuscular re-education, manual therapy, passive range of motion, splinting, electrical stimulation, ultrasound, moist heat, cryotherapy, patient/family education, and DME and/or AE instructions  CONSULTED AND AGREED WITH PLAN OF CARE: Patient  PLAN FOR NEXT SESSION: Follow up on HEP, complete A/ROM and functional reaching tasks   Trish Mage, OTR/L [phone] 04/09/2023, 3:48 PM

## 2023-04-08 NOTE — Telephone Encounter (Signed)
Dr. Mort Sawyers pt - spoke w/the pt, she stated that Dr. Rexene Edison was sending her for PT on [address], but she is already going there for her shoulder for Dr. Dallas Schimke.  PT told her that they could not see her for her hand until she is released from Dr. Salena Saner because it was two different doctors.  She stated she has found another place and would like a referral sent there.  The phone # is [phone], [facility].

## 2023-04-09 ENCOUNTER — Other Ambulatory Visit: Payer: Self-pay | Admitting: Orthopedic Surgery

## 2023-04-09 DIAGNOSIS — M12812 Other specific arthropathies, not elsewhere classified, left shoulder: Secondary | ICD-10-CM

## 2023-04-09 DIAGNOSIS — Z96612 Presence of left artificial shoulder joint: Secondary | ICD-10-CM

## 2023-04-09 NOTE — Progress Notes (Unsigned)
Dr Rexene Edison ordered OT hand but she can't have OT shoulder and hand only one or the other, so they asked to do below to get her taken care of  -We can transfer her to PT at this clinic for her shoulder and OT eval/treat the post-op finger. If we do this we just need a PT order for the shoulder and can transfer her care  This is done, to you FYI only

## 2023-04-15 ENCOUNTER — Encounter (HOSPITAL_COMMUNITY): Payer: Self-pay | Admitting: Occupational Therapy

## 2023-04-15 ENCOUNTER — Ambulatory Visit (HOSPITAL_COMMUNITY): Payer: 59 | Admitting: Occupational Therapy

## 2023-04-15 ENCOUNTER — Other Ambulatory Visit: Payer: Self-pay

## 2023-04-15 DIAGNOSIS — M25512 Pain in left shoulder: Secondary | ICD-10-CM | POA: Diagnosis not present

## 2023-04-15 DIAGNOSIS — M79644 Pain in right finger(s): Secondary | ICD-10-CM

## 2023-04-15 DIAGNOSIS — M12812 Other specific arthropathies, not elsewhere classified, left shoulder: Secondary | ICD-10-CM | POA: Diagnosis not present

## 2023-04-15 DIAGNOSIS — R29898 Other symptoms and signs involving the musculoskeletal system: Secondary | ICD-10-CM

## 2023-04-15 DIAGNOSIS — M25612 Stiffness of left shoulder, not elsewhere classified: Secondary | ICD-10-CM | POA: Diagnosis not present

## 2023-04-15 DIAGNOSIS — Z96612 Presence of left artificial shoulder joint: Secondary | ICD-10-CM | POA: Diagnosis not present

## 2023-04-15 NOTE — Patient Instructions (Signed)

## 2023-04-15 NOTE — Therapy (Signed)
[facility] Outpatient Rehabilitation at [facility] [address] [place], [place], [address] Phone: [phone]   Fax:  [phone]  Patient Details  Name: Leah Lynch MRN: [id number] Date of Birth: 17-Feb-1946 Referring Provider:  No ref. provider found  Encounter Date: 04/15/2023   OCCUPATIONAL THERAPY DISCHARGE SUMMARY  Visits from Start of Care: 2  Current functional level related to goals / functional outcomes: Pt will be transferring to PT for rehab services for her shoulder. Has new order for trigger finger for OT.   Remaining deficits: Same   Education / Equipment: HEP   Patient agrees to discharge. Patient goals were not met. Patient is being discharged due to the physician's request..     Ezra Sites, OTR/L  [phone] 04/15/2023, 12:47 PM  [facility] Outpatient Rehabilitation at [facility] [address] [place], [place], [address] Phone: [phone]   Fax:  [phone]

## 2023-04-15 NOTE — Therapy (Signed)
OUTPATIENT OCCUPATIONAL THERAPY ORTHO EVALUATION  Patient Name: Leah Lynch MRN: [id number] DOB:[birth year], 78 y.o., female Today's Date: 04/15/2023    END OF SESSION:  OT End of Session - 04/15/23 1339     Visit Number 1    Number of Visits 6    Date for OT Re-Evaluation 05/27/23    Authorization Type 1) [employer] Medicare Dual Complete  2) [place] Medicaid    Authorization Time Period no visit limit    Progress Note Due on Visit 10    OT Start Time 1303    OT Stop Time 1339    OT Time Calculation (min) 36 min    Activity Tolerance Patient tolerated treatment well    Behavior During Therapy WFL for tasks assessed/performed             Past Medical History:  Diagnosis Date   Fibrocystic breast    GERD (gastroesophageal reflux disease)    Hyperlipidemia    Hypertension    Osteoarthritis    Osteoporosis    PONV (postoperative nausea and vomiting)    Pre-diabetes    Shortness of breath dyspnea    Past Surgical History:  Procedure Laterality Date   ABDOMINAL HYSTERECTOMY     BIOPSY  04/15/2018   Procedure: BIOPSY;  Surgeon: Corbin Ade, MD;  [facility];  Service: Endoscopy;;  colon   BREAST CYST EXCISION Right    BREAST SURGERY     removal of cyst from right breast-fibrocystsic   BUNIONECTOMY  12/19/2010   Procedure: BUNIONECTOMY;  Surgeon: Dallas Schimke;  Location: [facility];  Service: Orthopedics;  Laterality: Right;  [name], [name] Bunionectomy   BUNIONECTOMY  05/22/2011   Procedure: Arbutus Leas;  Surgeon: Dallas Schimke, DPM;  Location: [facility];  Service: Orthopedics;  Laterality: Left;  [name] Bunionectomy Left Foot   CAPSULOTOMY  01/29/2012   Procedure: CAPSULOTOMY;  Surgeon: Dallas Schimke, DPM;  Location: [facility];  Service: Orthopedics;  Laterality: Left;   CATARACT EXTRACTION W/PHACO Right 08/09/2015   Procedure: CATARACT EXTRACTION PHACO AND INTRAOCULAR LENS PLACEMENT RIGHT EYE CDE=7.95;  Surgeon: Gemma Payor,  MD;  Location: [facility];  Service: Ophthalmology;  Laterality: Right;   CATARACT EXTRACTION W/PHACO Left 08/27/2015   Procedure: CATARACT EXTRACTION PHACO AND INTRAOCULAR LENS PLACEMENT LEFT EYE; CDE:  6.12;  Surgeon: Gemma Payor, MD;  Location: [facility];  Service: Ophthalmology;  Laterality: Left;   COLONOSCOPY  2012   Dr. Jena Gauss: internal hemorrhoids, hyperplastic polyps. Surveillance 2022   COLONOSCOPY WITH PROPOFOL N/A 04/15/2018   one 7 mm polyp at splenic flexure, one 4 mm polyp in rectum, otherwise normal. S/p segmental biopsy:normal. Normal TI   DIAGNOSTIC LAPAROSCOPY     ESOPHAGOGASTRODUODENOSCOPY (EGD) WITH PROPOFOL N/A 04/15/2018   normal   FOOT GANGLION EXCISION     right foot   METATARSAL OSTEOTOMY  05/22/2011   Procedure: METATARSAL OSTEOTOMY;  Surgeon: Dallas Schimke, DPM;  [facility]: [facility];  Service: Orthopedics;  Laterality: Left;  [name] Osteotomy Left Foot   POLYPECTOMY  04/15/2018   Procedure: POLYPECTOMY;  Surgeon: Corbin Ade, MD;  [facility];  Service: Endoscopy;;  colon   REVERSE SHOULDER ARTHROPLASTY Left 09/08/2022   Procedure: LEFT REVERSE SHOULDER ARTHROPLASTY;  Surgeon: Oliver Barre, MD;  Location: [facility];  Service: Orthopedics;  Laterality: Left;  RNFA NEEDED   TONSILLECTOMY  age 59   TRIGGER FINGER RELEASE Right 01/28/2018   Procedure: RIGHT TRIGGER THUMB RELEASE;  Surgeon: Vickki Hearing,  MD;  Location: [facility];  Service: Orthopedics;  Laterality: Right;   TRIGGER FINGER RELEASE Right 02/17/2023   Procedure: RELEASE TRIGGER FINGER/A-1 PULLEY;  Surgeon: Vickki Hearing, MD;  Location: [facility];  Service: Orthopedics;  Laterality: Right;   TUBAL LIGATION     Patient Active Problem List   Diagnosis Date Noted   Need for pneumococcal 20-valent conjugate vaccination 02/19/2023   Trigger finger, right middle finger 02/17/2023   Need for influenza vaccination 11/18/2022   Controlled type 2 diabetes mellitus without complication, without  long-term current use of insulin (HCC) 09/30/2022   Rotator cuff arthropathy of left shoulder 09/08/2022   Essential hypertension 09/03/2022   GERD (gastroesophageal reflux disease) 09/03/2022   Hyperlipidemia 09/03/2022   Osteoporosis 09/03/2022   Chronic musculoskeletal pain 09/03/2022   Rotator cuff arthropathy, left 09/03/2022   Screening for lung cancer 09/03/2022   Early satiety 02/22/2018   Loss of weight 02/22/2018   S/P trigger finger release right thumb 11/141/9 02/08/2018   Diarrhea 11/12/2016   Abdominal pain 11/12/2016   Nausea without vomiting 11/12/2016   PCP: Dr. Christel Mormon REFERRING PROVIDER: Dr. Fuller Canada  ONSET DATE: 02/17/23  REFERRING DIAG: R60.454 (ICD-10-CM) - Trigger finger, right middle finger   THERAPY DIAG:  Pain in right finger(s)  Other symptoms and signs involving the musculoskeletal system  Rationale for Evaluation and Treatment: Rehabilitation  SUBJECTIVE:   SUBJECTIVE STATEMENT: S: I can't straighten it or bend it all the way.  Pt accompanied by: self  PERTINENT HISTORY: Pt is a 78 y/o female s/p right 3rd digit trigger finger release on 02/17/23.  Pt reports extension lag and inability to achieve full flexion.   PRECAUTIONS: Other: ROM and progress as tolerated  WEIGHT BEARING RESTRICTIONS: No  PAIN:  Are you having pain? No  FALLS: Has patient fallen in last 6 months? Yes. Number of falls 1  PLOF: Independent  PATIENT GOALS: To be able to use her right hand functionally.   NEXT MD VISIT: 05/15/23  OBJECTIVE:  Note: Objective measures were completed at Evaluation unless otherwise noted.  HAND DOMINANCE: Right  ADLs: Pt reports difficulty gripping and holding objects. Cannot fully extend or flex her middle finger. Pt has difficulty opening jar lids or caps. Pt has pain at incision site when holding objects or pushing on things.   FUNCTIONAL OUTCOME MEASURES: Quick Dash: 25  UPPER EXTREMITY ROM:     Active ROM  Right eval  Index MCP (0-90) 66  Index PIP (0-100) 58  Index DIP (0-70)  46  Long MCP (0-90)  72  Long PIP (0-100)  68  Long DIP (0-70)  42  Ring MCP (0-90) 76   Ring PIP (0-100)  90  Ring DIP (0-70)  46  Little MCP (0-90)  86  Little PIP (0-100)  78  Little DIP (0-70)  58  (Blank rows = not tested)   HAND FUNCTION: Grip strength: Right: 30 lbs; Left: 48 lbs, Lateral pinch: Right: 10 lbs, Left: 11 lbs, and 3 point pinch: Right: 8 lbs, Left: 10 lbs  COORDINATION: 9 Hole Peg test: Right: 25.21" sec; Left: 25.86" sec  SENSATION: WFL  EDEMA:  Right MCPs  Left MCPs 7.25cm  7.25cm  COGNITION: Overall cognitive status: Within functional limits for tasks assessed  OBSERVATIONS: edema and scar tissue at incision site at volar base of 2nd and 3rd digit MCPs   TREATMENT DATE:  Eval -Finger taps: 10 reps -Composite digit flexion/extension, 10 reps -Towel crunch: 10 reps -  Digit abduction/adduction, 5 reps                                                                                                                     PATIENT EDUCATION: Education details: finger A/ROM Person educated: Patient Education method: Explanation, Demonstration, and Handouts Education comprehension: verbalized understanding and returned demonstration  HOME EXERCISE PROGRAM: Eval: finger A/ROM  GOALS: Goals reviewed with patient? Yes  SHORT TERM GOALS: Target date: 04/29/23  Pt will be provided with and educated on HEP to improve mobility in right hand required for use as dominant during ADL tasks.  Goal status: INITIAL   LONG TERM GOALS: Target date: 05/27/23  Pt will increase A/ROM of right index and middle fingers by 10 degrees to improve ability to make a full fist required for grasping items during ADLs.   Goal status: INITIAL  2.  Pt will decrease pain in right hand to 2/10 or less to improve ability to use the right hand during housekeeping tasks.  Goal status: INITIAL  3.  Pt  will decrease right hand scar and fascial restrictions to minimal amounts to improve mobility in hand required for manipulating small objects.    Goal status: INITIAL  4.  Pt will increase right hand grip strength by 15# and pinch strength by 3# to improve ability to grip and open jars and container lids.   Goal status: INITIAL    ASSESSMENT:  CLINICAL IMPRESSION: Patient is a 78 y.o. female who was seen today for occupational therapy evaluation s/p right trigger finger release on 02/17/23. Pt reports scar tissue at incision site as well as extension lag and flexion limitations. Pt is unable to use right hand as dominant due to pain and weakness in the hand, ROM deficits impede mobility at times. Pt will benefit from skilled OT services to improve right hand ROM, strength, and functional use.    PERFORMANCE DEFICITS: in functional skills including ADLs, IADLs, coordination, dexterity, ROM, strength, pain, fascial restrictions, Fine motor control, and UE functional use  IMPAIRMENTS: are limiting patient from ADLs, IADLs, rest and sleep, and leisure.   COMORBIDITIES: has no other co-morbidities that affects occupational performance. Patient will benefit from skilled OT to address above impairments and improve overall function.  MODIFICATION OR ASSISTANCE TO COMPLETE EVALUATION: No modification of tasks or assist necessary to complete an evaluation.  OT OCCUPATIONAL PROFILE AND HISTORY: Problem focused assessment: Including review of records relating to presenting problem.  CLINICAL DECISION MAKING: LOW - limited treatment options, no task modification necessary  REHAB POTENTIAL: Good  EVALUATION COMPLEXITY: Low      PLAN:  OT FREQUENCY: 1x/week  OT DURATION: 6 weeks  PLANNED INTERVENTIONS: 97168 OT Re-evaluation, 97535 self care/ADL training, 16109 therapeutic exercise, 97530 therapeutic activity, 97140 manual therapy, 97035 ultrasound, 97018 paraffin, 60454 electrical  stimulation unattended, patient/family education, and DME and/or AE instructions  RECOMMENDED OTHER SERVICES: None at this time  CONSULTED AND AGREED WITH PLAN OF CARE: Patient  PLAN FOR NEXT SESSION: Follow  up on HEP, manual techniques, A/ROM, gentle grip/pinch strengthening    Ezra Sites, OTR/L  [phone] 04/15/2023, 1:40 PM

## 2023-04-20 ENCOUNTER — Ambulatory Visit (HOSPITAL_COMMUNITY): Payer: 59 | Attending: Orthopedic Surgery

## 2023-04-20 ENCOUNTER — Encounter (HOSPITAL_COMMUNITY): Payer: Self-pay

## 2023-04-20 ENCOUNTER — Other Ambulatory Visit: Payer: Self-pay

## 2023-04-20 DIAGNOSIS — M12812 Other specific arthropathies, not elsewhere classified, left shoulder: Secondary | ICD-10-CM | POA: Insufficient documentation

## 2023-04-20 DIAGNOSIS — M25612 Stiffness of left shoulder, not elsewhere classified: Secondary | ICD-10-CM | POA: Insufficient documentation

## 2023-04-20 DIAGNOSIS — M25512 Pain in left shoulder: Secondary | ICD-10-CM | POA: Diagnosis not present

## 2023-04-20 DIAGNOSIS — M6281 Muscle weakness (generalized): Secondary | ICD-10-CM | POA: Diagnosis not present

## 2023-04-20 DIAGNOSIS — R29898 Other symptoms and signs involving the musculoskeletal system: Secondary | ICD-10-CM | POA: Diagnosis not present

## 2023-04-20 DIAGNOSIS — Z96612 Presence of left artificial shoulder joint: Secondary | ICD-10-CM | POA: Diagnosis not present

## 2023-04-20 DIAGNOSIS — M79644 Pain in right finger(s): Secondary | ICD-10-CM | POA: Diagnosis not present

## 2023-04-20 NOTE — Therapy (Addendum)
OUTPATIENT PHYSICAL THERAPY ORTHO EVALUATION  Patient Name: Leah Lynch MRN: [id number] DOB:1946-01-12, 78 y.o., female Today's Date: 04/20/2023  PCP: Billie Lade, MD REFERRING PROVIDER: Oliver Barre, MD  END OF SESSION:   PT End of Session - 04/20/23 1357     Visit Number 1    Date for PT Re-Evaluation 06/15/23    Authorization Type [employer] Dual complete    Authorization Time Period no auth; no limit    Progress Note Due on Visit 8    PT Start Time 1015    PT Stop Time 1101    PT Time Calculation (min) 46 min    Activity Tolerance Patient tolerated treatment well    Behavior During Therapy WFL for tasks assessed/performed             Past Medical History:  Diagnosis Date   Fibrocystic breast    GERD (gastroesophageal reflux disease)    Hyperlipidemia    Hypertension    Osteoarthritis    Osteoporosis    PONV (postoperative nausea and vomiting)    Pre-diabetes    Shortness of breath dyspnea    Past Surgical History:  Procedure Laterality Date   ABDOMINAL HYSTERECTOMY     BIOPSY  04/15/2018   Procedure: BIOPSY;  Surgeon: Corbin Ade, MD;  [facility];  Service: Endoscopy;;  colon   BREAST CYST EXCISION Right    BREAST SURGERY     removal of cyst from right breast-fibrocystsic   BUNIONECTOMY  12/19/2010   Procedure: BUNIONECTOMY;  Surgeon: Dallas Schimke;  [facility]: [facility];  Service: Orthopedics;  Laterality: Right;  [name], [name] Bunionectomy   BUNIONECTOMY  05/22/2011   Procedure: Arbutus Leas;  Surgeon: Dallas Schimke, DPM;  Location: [facility];  Service: Orthopedics;  Laterality: Left;  [name] Bunionectomy Left Foot   CAPSULOTOMY  01/29/2012   Procedure: CAPSULOTOMY;  Surgeon: Dallas Schimke, DPM;  Location: [facility];  Service: Orthopedics;  Laterality: Left;   CATARACT EXTRACTION W/PHACO Right 08/09/2015   Procedure: CATARACT EXTRACTION PHACO AND INTRAOCULAR LENS PLACEMENT RIGHT EYE CDE=7.95;  Surgeon:  Gemma Payor, MD;  Location: [facility];  Service: Ophthalmology;  Laterality: Right;   CATARACT EXTRACTION W/PHACO Left 08/27/2015   Procedure: CATARACT EXTRACTION PHACO AND INTRAOCULAR LENS PLACEMENT LEFT EYE; CDE:  6.12;  Surgeon: Gemma Payor, MD;  Location: [facility];  Service: Ophthalmology;  Laterality: Left;   COLONOSCOPY  2012   Dr. Jena Gauss: internal hemorrhoids, hyperplastic polyps. Surveillance 2022   COLONOSCOPY WITH PROPOFOL N/A 04/15/2018   one 7 mm polyp at splenic flexure, one 4 mm polyp in rectum, otherwise normal. S/p segmental biopsy:normal. Normal TI   DIAGNOSTIC LAPAROSCOPY     ESOPHAGOGASTRODUODENOSCOPY (EGD) WITH PROPOFOL N/A 04/15/2018   normal   FOOT GANGLION EXCISION     right foot   METATARSAL OSTEOTOMY  05/22/2011   Procedure: METATARSAL OSTEOTOMY;  Surgeon: Dallas Schimke, DPM;  [facility]: [facility];  Service: Orthopedics;  Laterality: Left;  [name] Osteotomy Left Foot   POLYPECTOMY  04/15/2018   Procedure: POLYPECTOMY;  Surgeon: Corbin Ade, MD;  [facility];  Service: Endoscopy;;  colon   REVERSE SHOULDER ARTHROPLASTY Left 09/08/2022   Procedure: LEFT REVERSE SHOULDER ARTHROPLASTY;  Surgeon: Oliver Barre, MD;  [facility]: [facility];  Service: Orthopedics;  Laterality: Left;  RNFA NEEDED   TONSILLECTOMY  age 6   TRIGGER FINGER RELEASE Right 01/28/2018   Procedure: RIGHT TRIGGER THUMB RELEASE;  Surgeon: Vickki Hearing, MD;  Location: [facility];  Service: Orthopedics;  Laterality: Right;   TRIGGER FINGER RELEASE Right 02/17/2023   Procedure: RELEASE TRIGGER FINGER/A-1 PULLEY;  Surgeon: Vickki Hearing, MD;  Location: [facility];  Service: Orthopedics;  Laterality: Right;   TUBAL LIGATION     Patient Active Problem List   Diagnosis Date Noted   Need for pneumococcal 20-valent conjugate vaccination 02/19/2023   Trigger finger, right middle finger 02/17/2023   Need for influenza vaccination 11/18/2022   Controlled type 2 diabetes mellitus without  complication, without long-term current use of insulin (HCC) 09/30/2022   Rotator cuff arthropathy of left shoulder 09/08/2022   Essential hypertension 09/03/2022   GERD (gastroesophageal reflux disease) 09/03/2022   Hyperlipidemia 09/03/2022   Osteoporosis 09/03/2022   Chronic musculoskeletal pain 09/03/2022   Rotator cuff arthropathy, left 09/03/2022   Screening for lung cancer 09/03/2022   Early satiety 02/22/2018   Loss of weight 02/22/2018   S/P trigger finger release right thumb 11/141/9 02/08/2018   Diarrhea 11/12/2016   Abdominal pain 11/12/2016   Nausea without vomiting 11/12/2016    ONSET DATE: Billie Lade, MD  REFERRING DIAG: Oliver Barre, MD  THERAPY DIAG:  Status post reverse total arthroplasty of left shoulder  Muscle weakness (generalized)  Rationale for Evaluation and Treatment: Rehabilitation  SUBJECTIVE:   SUBJECTIVE STATEMENT: Pt had R TSA reverse in June of 2024. Pt continues with limited ROM and wants to improve it. Pt reports limitations hair brushing, limitations in cutting her own hair.  Difficulty reporting putting her hand behind her back. Pt reports she doesn't have the strength to hold on to heavy objects when lifting overhead. Reports mild numbness in left anterior shoulder region.   Pt accompanied by: self  PERTINENT HISTORY:   Left reverse TSA on 09/08/2022  PRECAUTIONS: None  RED FLAGS: None   WEIGHT BEARING RESTRICTIONS: No  PAIN:  Are you having pain? Yes: NPRS scale: 7/10 worst at End ROm Pain location: Left anterior-lateral shoulder region  Pain description: Sharp Aggravating factors: Overhead lifting Relieving factors: avoiding lifting  FALLS: Has patient fallen in last 6 months? Yes. Number of falls 1   PATIENT GOALS: "get it up a little bit higher with reduced pain"  OBJECTIVE:  Note: Objective measures were completed at Evaluation unless otherwise noted.  HAND DOMINANCE: Right  ADLs: Overall ADLs: modified  independent, limitations with hair brushing Transfers/ambulation related to ADLs: Independent Grooming: limited Upper body dressing: independent Toileting: independent Bathing: independent  FUNCTIONAL OUTCOME MEASURES: Quick Dash: 15.9/100= 15.9%  UPPER EXTREMITY ROM:     Active ROM Right eval Left eval  Shoulder flexion 160 105A; 125P  Shoulder abduction 150 90A; 120P  Shoulder adduction    Shoulder extension    Shoulder internal rotation    Shoulder external rotation 75 15A; 25P  Elbow flexion    Elbow extension    Wrist flexion    Wrist extension    Wrist ulnar deviation    Wrist radial deviation    Wrist pronation    Wrist supination    (Blank rows = not tested)   UPPER EXTREMITY MMT:     MMT Right eval Left eval  Shoulder flexion 4- 2+  Shoulder abduction 4- 2+  Shoulder adduction    Shoulder extension    Shoulder internal rotation    Shoulder external rotation 3+ 2+  Middle trapezius 3+ 3+  Lower trapezius 3 2  Elbow flexion    Elbow extension    Wrist flexion  Wrist extension    Wrist ulnar deviation    Wrist radial deviation    Wrist pronation    Wrist supination    (Blank rows = not tested)   SENSATION: WFL  COGNITION: Overall cognitive status: Within functional limits for tasks assessed   TREATMENT DATE:   04/20/2023 PT Evaluation    And HEP Below                                                                                                                            PATIENT EDUCATION: Education details: PT Evaluation, findings, prognosis, frequency, attendance policy, and HEP if given.  Person educated: Patient Education method: Explanation, Demonstration, Tactile cues, and Verbal cues Education comprehension: verbalized understanding and returned demonstration  HOME EXERCISE PROGRAM: Access Code: [email or web address] URL: [email or web address] Date: 04/30/2023 Prepared by: Starling Manns  Exercises - Sidelying Shoulder  External Rotation  - 1 x daily - 7 x weekly - 3 sets - 12-15 reps - Sidelying Shoulder Abduction Palm Forward  - 1 x daily - 7 x weekly - 3 sets - 12-15 reps - Sidelying Shoulder Flexion 15 Degrees  - 1 x daily - 7 x weekly - 3 sets - 12-15 reps  GOALS: Goals reviewed with patient? No  SHORT TERM GOALS: Target date: 05/18/2023  Pt will be independent with HEP in order to demonstrate participation in Physical Therapy POC.  Baseline: Goal status: INITIAL  2.  Pt will 4/10 pain during mobility in order to demonstrate improved pain with functional activities.  Baseline:  Goal status: INITIAL  LONG TERM GOALS: Target date: 06/15/2023  Pt will improve Shoulder flexion MMT by >1/2 grade in order to demonstrate improved functional strength to return to desired activities.  Baseline: See objective.  Goal status: INITIAL  2.  Pt will improve Shoulder Abduction MMT by >1/2 grade in order to demonstrate improved functional ambulatory capacity in community setting.  Baseline: See objective.  Goal status: INITIAL  3.  Pt will improve QuickDASH score by 3 points in order to demonstrate improved pain with functional goals and outcomes. Baseline: See objective.  Goal status: INITIAL  4.  Pt will improve LUE AROM by 10 degrees in order to improve capacity during functional overhead activities.. Baseline: See objective.  Goal status: INITIAL  5.  Pt will 2/10 pain during mobility in order to demonstrate improved pain with functional activities.  Baseline:  Goal status: INITIAL  ASSESSMENT:  CLINICAL IMPRESSION: Patient is a 78 y.o. female who was seen today for Physical therapy evaluation for  M12.812 (ICD-10-CM) - Rotator cuff arthropathy of left shoulder  Z96.612 (ICD-10-CM) - Status post reverse arthroplasty of left shoulder   Pt is demonstrating moderate ADL limitations, overhead reaching limitations due to muscle weakness and ROM in left shoulder. Prognosis in progression due to timeline  from surgery on date in 08/2022. [name] Pt will benefit from skilled Physical Therapy services to address deficits/limitations in order to improve functional and QOL.  PERFORMANCE DEFICITS: in functional skills including ADLs, ROM, strength, and pain,.   IMPAIRMENTS: are limiting patient from ADLs and education.   COMORBIDITIES: has no other co-morbidities that affects occupational performance. Patient will benefit from skilled OT to address above impairments and improve overall function.  MODIFICATION OR ASSISTANCE TO COMPLETE EVALUATION: No modification of tasks or assist necessary to complete an evaluation.  OT OCCUPATIONAL PROFILE AND HISTORY: Problem focused assessment: Including review of records relating to presenting problem.  CLINICAL DECISION MAKING: LOW - limited treatment options, no task modification necessary  REHAB POTENTIAL: Fair > 6 months from surgery of total reverse shoulder arthroplasty  EVALUATION COMPLEXITY: Low   PLAN:  PT FREQUENCY: 1x/week  PT DURATION: 8 weeks  PLANNED INTERVENTIONS: 97535 self care/ADL training, 09811 therapeutic exercise, 97530 therapeutic activity, 97112 neuromuscular re-education, 97140 manual therapy, [id number] electrical stimulation (manual), 97014 electrical stimulation unattended, 97102 mechanical traction, passive range of motion, and 97164 PT-Re-evaluation    CONSULTED AND AGREED WITH PLAN OF CARE: Patient  PLAN FOR NEXT SESSION: isolated L shoulder flexion/abduction and ER strengthening  Nelida Meuse PT, DPT [facility] [place] [address] [phone] office   Nelida Meuse, PT 04/20/2023, 1:58 PM

## 2023-04-22 ENCOUNTER — Ambulatory Visit (HOSPITAL_COMMUNITY): Payer: 59 | Admitting: Occupational Therapy

## 2023-04-22 ENCOUNTER — Encounter (HOSPITAL_COMMUNITY): Payer: Self-pay | Admitting: Occupational Therapy

## 2023-04-22 DIAGNOSIS — M6281 Muscle weakness (generalized): Secondary | ICD-10-CM | POA: Diagnosis not present

## 2023-04-22 DIAGNOSIS — M79644 Pain in right finger(s): Secondary | ICD-10-CM | POA: Diagnosis not present

## 2023-04-22 DIAGNOSIS — M12812 Other specific arthropathies, not elsewhere classified, left shoulder: Secondary | ICD-10-CM | POA: Diagnosis not present

## 2023-04-22 DIAGNOSIS — M25612 Stiffness of left shoulder, not elsewhere classified: Secondary | ICD-10-CM | POA: Diagnosis not present

## 2023-04-22 DIAGNOSIS — Z96612 Presence of left artificial shoulder joint: Secondary | ICD-10-CM | POA: Diagnosis not present

## 2023-04-22 DIAGNOSIS — R29898 Other symptoms and signs involving the musculoskeletal system: Secondary | ICD-10-CM

## 2023-04-22 DIAGNOSIS — M25512 Pain in left shoulder: Secondary | ICD-10-CM | POA: Diagnosis not present

## 2023-04-22 NOTE — Therapy (Signed)
 OUTPATIENT OCCUPATIONAL THERAPY ORTHO TREATMENT NOTE  Patient Name: Leah Lynch MRN: [id number] DOB:[birth year], 78 y.o., female Today's Date: 04/22/2023    END OF SESSION:  OT End of Session - 04/22/23 1348     Visit Number 2    Number of Visits 6    Date for OT Re-Evaluation 05/27/23    Authorization Type 1) [employer] Medicare Dual Complete  2) [place] Medicaid    Authorization Time Period no visit limit    Progress Note Due on Visit 10    OT Start Time 1307    OT Stop Time 1348    OT Time Calculation (min) 41 min    Activity Tolerance Patient tolerated treatment well    Behavior During Therapy WFL for tasks assessed/performed              Past Medical History:  Diagnosis Date   Fibrocystic breast    GERD (gastroesophageal reflux disease)    Hyperlipidemia    Hypertension    Osteoarthritis    Osteoporosis    PONV (postoperative nausea and vomiting)    Pre-diabetes    Shortness of breath dyspnea    Past Surgical History:  Procedure Laterality Date   ABDOMINAL HYSTERECTOMY     BIOPSY  04/15/2018   Procedure: BIOPSY;  Surgeon: Shaaron Lamar HERO, MD;  [facility];  Service: Endoscopy;;  colon   BREAST CYST EXCISION Right    BREAST SURGERY     removal of cyst from right breast-fibrocystsic   BUNIONECTOMY  12/19/2010   Procedure: BUNIONECTOMY;  Surgeon: Morene Donley Anon;  Location: [facility];  Service: Orthopedics;  Laterality: Right;  Massie Edwards, [name] Bunionectomy   BUNIONECTOMY  05/22/2011   Procedure: EDWARDS;  Surgeon: Morene Donley Anon, DPM;  Location: [facility];  Service: Orthopedics;  Laterality: Left;  [name] Bunionectomy Left Foot   CAPSULOTOMY  01/29/2012   Procedure: CAPSULOTOMY;  Surgeon: Morene Donley Anon, DPM;  Location: [facility];  Service: Orthopedics;  Laterality: Left;   CATARACT EXTRACTION W/PHACO Right 08/09/2015   Procedure: CATARACT EXTRACTION PHACO AND INTRAOCULAR LENS PLACEMENT RIGHT EYE CDE=7.95;  Surgeon: Cherene Mania, MD;  Location: [facility];  Service: Ophthalmology;  Laterality: Right;   CATARACT EXTRACTION W/PHACO Left 08/27/2015   Procedure: CATARACT EXTRACTION PHACO AND INTRAOCULAR LENS PLACEMENT LEFT EYE; CDE:  6.12;  Surgeon: Cherene Mania, MD;  Location: [facility];  Service: Ophthalmology;  Laterality: Left;   COLONOSCOPY  2012   Dr. Shaaron: internal hemorrhoids, hyperplastic polyps. Surveillance 2022   COLONOSCOPY WITH PROPOFOL  N/A 04/15/2018   one 7 mm polyp at splenic flexure, one 4 mm polyp in rectum, otherwise normal. S/p segmental biopsy:normal. Normal TI   DIAGNOSTIC LAPAROSCOPY     ESOPHAGOGASTRODUODENOSCOPY (EGD) WITH PROPOFOL  N/A 04/15/2018   normal   FOOT GANGLION EXCISION     right foot   METATARSAL OSTEOTOMY  05/22/2011   Procedure: METATARSAL OSTEOTOMY;  Surgeon: Morene Donley Anon, DPM;  [facility]: [facility];  Service: Orthopedics;  Laterality: Left;  [name] Osteotomy Left Foot   POLYPECTOMY  04/15/2018   Procedure: POLYPECTOMY;  Surgeon: Shaaron Lamar HERO, MD;  [facility];  Service: Endoscopy;;  colon   REVERSE SHOULDER ARTHROPLASTY Left 09/08/2022   Procedure: LEFT REVERSE SHOULDER ARTHROPLASTY;  Surgeon: Onesimo Oneil LABOR, MD;  Location: [facility];  Service: Orthopedics;  Laterality: Left;  RNFA NEEDED   TONSILLECTOMY  age 58   TRIGGER FINGER RELEASE Right 01/28/2018   Procedure: RIGHT TRIGGER THUMB RELEASE;  Surgeon: Margrette,  Taft BRAVO, MD;  Location: [facility];  Service: Orthopedics;  Laterality: Right;   TRIGGER FINGER RELEASE Right 02/17/2023   Procedure: RELEASE TRIGGER FINGER/A-1 PULLEY;  Surgeon: Margrette Taft BRAVO, MD;  Location: [facility];  Service: Orthopedics;  Laterality: Right;   TUBAL LIGATION     Patient Active Problem List   Diagnosis Date Noted   Need for pneumococcal 20-valent conjugate vaccination 02/19/2023   Trigger finger, right middle finger 02/17/2023   Need for influenza vaccination 11/18/2022   Controlled type 2 diabetes mellitus without complication,  without long-term current use of insulin (HCC) 09/30/2022   Rotator cuff arthropathy of left shoulder 09/08/2022   Essential hypertension 09/03/2022   GERD (gastroesophageal reflux disease) 09/03/2022   Hyperlipidemia 09/03/2022   Osteoporosis 09/03/2022   Chronic musculoskeletal pain 09/03/2022   Rotator cuff arthropathy, left 09/03/2022   Screening for lung cancer 09/03/2022   Early satiety 02/22/2018   Loss of weight 02/22/2018   S/P trigger finger release right thumb 11/141/9 02/08/2018   Diarrhea 11/12/2016   Abdominal pain 11/12/2016   Nausea without vomiting 11/12/2016   PCP: Dr. Manus Fireman REFERRING PROVIDER: Dr. Taft Margrette  ONSET DATE: 02/17/23  REFERRING DIAG: F34.668 (ICD-10-CM) - Trigger finger, right middle finger   THERAPY DIAG:  Pain in right finger(s)  Other symptoms and signs involving the musculoskeletal system  Rationale for Evaluation and Treatment: Rehabilitation  SUBJECTIVE:   SUBJECTIVE STATEMENT: S: I can't straighten it or bend it all the way.  Pt accompanied by: self  PERTINENT HISTORY: Pt is a 78 y/o female s/p right 3rd digit trigger finger release on 02/17/23.  Pt reports extension lag and inability to achieve full flexion.   PRECAUTIONS: Other: ROM and progress as tolerated  WEIGHT BEARING RESTRICTIONS: No  PAIN:  Are you having pain? No  FALLS: Has patient fallen in last 6 months? Yes. Number of falls 1  PLOF: Independent  PATIENT GOALS: To be able to use her right hand functionally.   NEXT MD VISIT: 05/15/23  OBJECTIVE:  Note: Objective measures were completed at Evaluation unless otherwise noted.  HAND DOMINANCE: Right  ADLs: Pt reports difficulty gripping and holding objects. Cannot fully extend or flex her middle finger. Pt has difficulty opening jar lids or caps. Pt has pain at incision site when holding objects or pushing on things.   FUNCTIONAL OUTCOME MEASURES: Quick Dash: 25  UPPER EXTREMITY ROM:     Active  ROM Right eval  Index MCP (0-90) 66  Index PIP (0-100) 58  Index DIP (0-70)  46  Long MCP (0-90)  72  Long PIP (0-100)  68  Long DIP (0-70)  42  Ring MCP (0-90) 76   Ring PIP (0-100)  90  Ring DIP (0-70)  46  Little MCP (0-90)  86  Little PIP (0-100)  78  Little DIP (0-70)  58  (Blank rows = not tested)   HAND FUNCTION: Grip strength: Right: 30 lbs; Left: 48 lbs, Lateral pinch: Right: 10 lbs, Left: 11 lbs, and 3 point pinch: Right: 8 lbs, Left: 10 lbs  COORDINATION: 9 Hole Peg test: Right: 25.21 sec; Left: 25.86 sec  SENSATION: WFL  EDEMA:  Right MCPs  Left MCPs 7.25cm  7.25cm  COGNITION: Overall cognitive status: Within functional limits for tasks assessed  OBSERVATIONS: edema and scar tissue at incision site at volar base of 2nd and 3rd digit MCPs   TREATMENT DATE:   04/22/23 -Manual Therapy: myofascial release, trigger point, applied to palmar aspect  of hand in order to reduce fascial restrictions and pain, as well as improve ROM -Digit ROM: composite flexion, abduction/adduction, opposition, finger taps, x10 -Towel scrunches x10 -wringing out the towel, x10 -Pinching: yellow resistance clip stacking 6 cubes  Eval -Finger taps: 10 reps -Composite digit flexion/extension, 10 reps -Towel crunch: 10 reps -Digit abduction/adduction, 5 reps                                                                                                                     PATIENT EDUCATION: Education details: Towel scrunches and wringing out Person educated: Patient Education method: Explanation, Demonstration, and Handouts Education comprehension: verbalized understanding and returned demonstration  HOME EXERCISE PROGRAM: Eval: finger A/ROM 2/5: Towel scrunches and wringing out  GOALS: Goals reviewed with patient? Yes  SHORT TERM GOALS: Target date: 04/29/23  Pt will be provided with and educated on HEP to improve mobility in right hand required for use as dominant  during ADL tasks.  Goal status: IN PROGRESS   LONG TERM GOALS: Target date: 05/27/23  Pt will increase A/ROM of right index and middle fingers by 10 degrees to improve ability to make a full fist required for grasping items during ADLs.   Goal status: IN PROGRESS  2.  Pt will decrease pain in right hand to 2/10 or less to improve ability to use the right hand during housekeeping tasks.  Goal status: IN PROGRESS  3.  Pt will decrease right hand scar and fascial restrictions to minimal amounts to improve mobility in hand required for manipulating small objects.    Goal status: IN PROGRESS  4.  Pt will increase right hand grip strength by 15# and pinch strength by 3# to improve ability to grip and open jars and container lids.   Goal status: IN PROGRESS    ASSESSMENT:  CLINICAL IMPRESSION: This session pt presenting with increased tendon tension, limiting her ability to fully extend/tap her middle finger, as well as increasing pain. OT provided manual therapy to address/decrease the tension. Her ROM appears to be improving, however popping and clicking noted in the PIP joint with towel gripping and scrunches. Verbal and tactile cuing provided for positioning and technique throughout session.   PERFORMANCE DEFICITS: in functional skills including ADLs, IADLs, coordination, dexterity, ROM, strength, pain, fascial restrictions, Fine motor control, and UE functional use   PLAN:  OT FREQUENCY: 1x/week  OT DURATION: 6 weeks  PLANNED INTERVENTIONS: 97168 OT Re-evaluation, 97535 self care/ADL training, 02889 therapeutic exercise, 97530 therapeutic activity, 97140 manual therapy, 97035 ultrasound, 97018 paraffin, 02985 electrical stimulation unattended, patient/family education, and DME and/or AE instructions  RECOMMENDED OTHER SERVICES: None at this time  CONSULTED AND AGREED WITH PLAN OF CARE: Patient  PLAN FOR NEXT SESSION: Follow up on HEP, manual techniques, A/ROM, gentle  grip/pinch strengthening   Valentin Nightingale, OTR/L [phone] 04/22/2023, 2:15 PM

## 2023-04-29 ENCOUNTER — Ambulatory Visit (HOSPITAL_COMMUNITY): Payer: 59 | Admitting: Occupational Therapy

## 2023-04-29 ENCOUNTER — Encounter (HOSPITAL_COMMUNITY): Payer: Self-pay | Admitting: Occupational Therapy

## 2023-04-29 DIAGNOSIS — M25512 Pain in left shoulder: Secondary | ICD-10-CM | POA: Diagnosis not present

## 2023-04-29 DIAGNOSIS — M6281 Muscle weakness (generalized): Secondary | ICD-10-CM | POA: Diagnosis not present

## 2023-04-29 DIAGNOSIS — Z96612 Presence of left artificial shoulder joint: Secondary | ICD-10-CM | POA: Diagnosis not present

## 2023-04-29 DIAGNOSIS — M79644 Pain in right finger(s): Secondary | ICD-10-CM

## 2023-04-29 DIAGNOSIS — R29898 Other symptoms and signs involving the musculoskeletal system: Secondary | ICD-10-CM

## 2023-04-29 DIAGNOSIS — M25612 Stiffness of left shoulder, not elsewhere classified: Secondary | ICD-10-CM | POA: Diagnosis not present

## 2023-04-29 DIAGNOSIS — M12812 Other specific arthropathies, not elsewhere classified, left shoulder: Secondary | ICD-10-CM | POA: Diagnosis not present

## 2023-04-29 NOTE — Therapy (Unsigned)
OUTPATIENT OCCUPATIONAL THERAPY ORTHO TREATMENT NOTE  Patient Name: Leah Lynch MRN: [id number] DOB:September 17, 1945, 78 y.o., female Today's Date: 04/30/2023    END OF SESSION:  OT End of Session - 04/29/23 1346     Visit Number 3    Number of Visits 6    Date for OT Re-Evaluation 05/27/23    Authorization Type 1) [employer] Medicare Dual Complete  2) [place] Medicaid    Authorization Time Period no visit limit    Progress Note Due on Visit 10    OT Start Time 1303    OT Stop Time 1346    OT Time Calculation (min) 43 min    Activity Tolerance Patient tolerated treatment well    Behavior During Therapy WFL for tasks assessed/performed             Past Medical History:  Diagnosis Date   Fibrocystic breast    GERD (gastroesophageal reflux disease)    Hyperlipidemia    Hypertension    Osteoarthritis    Osteoporosis    PONV (postoperative nausea and vomiting)    Pre-diabetes    Shortness of breath dyspnea    Past Surgical History:  Procedure Laterality Date   ABDOMINAL HYSTERECTOMY     BIOPSY  04/15/2018   Procedure: BIOPSY;  Surgeon: Corbin Ade, MD;  [facility];  Service: Endoscopy;;  colon   BREAST CYST EXCISION Right    BREAST SURGERY     removal of cyst from right breast-fibrocystsic   BUNIONECTOMY  12/19/2010   Procedure: BUNIONECTOMY;  Surgeon: Dallas Schimke;  Location: [facility];  Service: Orthopedics;  Laterality: Right;  [name], [name] Bunionectomy   BUNIONECTOMY  05/22/2011   Procedure: Arbutus Leas;  Surgeon: Dallas Schimke, DPM;  Location: [facility];  Service: Orthopedics;  Laterality: Left;  [name] Bunionectomy Left Foot   CAPSULOTOMY  01/29/2012   Procedure: CAPSULOTOMY;  Surgeon: Dallas Schimke, DPM;  Location: [facility];  Service: Orthopedics;  Laterality: Left;   CATARACT EXTRACTION W/PHACO Right 08/09/2015   Procedure: CATARACT EXTRACTION PHACO AND INTRAOCULAR LENS PLACEMENT RIGHT EYE CDE=7.95;  Surgeon: Gemma Payor, MD;  Location: [facility];  Service: Ophthalmology;  Laterality: Right;   CATARACT EXTRACTION W/PHACO Left 08/27/2015   Procedure: CATARACT EXTRACTION PHACO AND INTRAOCULAR LENS PLACEMENT LEFT EYE; CDE:  6.12;  Surgeon: Gemma Payor, MD;  Location: [facility];  Service: Ophthalmology;  Laterality: Left;   COLONOSCOPY  2012   Dr. Jena Gauss: internal hemorrhoids, hyperplastic polyps. Surveillance 2022   COLONOSCOPY WITH PROPOFOL N/A 04/15/2018   one 7 mm polyp at splenic flexure, one 4 mm polyp in rectum, otherwise normal. S/p segmental biopsy:normal. Normal TI   DIAGNOSTIC LAPAROSCOPY     ESOPHAGOGASTRODUODENOSCOPY (EGD) WITH PROPOFOL N/A 04/15/2018   normal   FOOT GANGLION EXCISION     right foot   METATARSAL OSTEOTOMY  05/22/2011   Procedure: METATARSAL OSTEOTOMY;  Surgeon: Dallas Schimke, DPM;  [facility]: [facility];  Service: Orthopedics;  Laterality: Left;  [name] Osteotomy Left Foot   POLYPECTOMY  04/15/2018   Procedure: POLYPECTOMY;  Surgeon: Corbin Ade, MD;  [facility];  Service: Endoscopy;;  colon   REVERSE SHOULDER ARTHROPLASTY Left 09/08/2022   Procedure: LEFT REVERSE SHOULDER ARTHROPLASTY;  Surgeon: Oliver Barre, MD;  Location: [facility];  Service: Orthopedics;  Laterality: Left;  RNFA NEEDED   TONSILLECTOMY  age 44   TRIGGER FINGER RELEASE Right 01/28/2018   Procedure: RIGHT TRIGGER THUMB RELEASE;  Surgeon: Fuller Canada  E, MD;  Location: [facility];  Service: Orthopedics;  Laterality: Right;   TRIGGER FINGER RELEASE Right 02/17/2023   Procedure: RELEASE TRIGGER FINGER/A-1 PULLEY;  Surgeon: Vickki Hearing, MD;  Location: [facility];  Service: Orthopedics;  Laterality: Right;   TUBAL LIGATION     Patient Active Problem List   Diagnosis Date Noted   Need for pneumococcal 20-valent conjugate vaccination 02/19/2023   Trigger finger, right middle finger 02/17/2023   Need for influenza vaccination 11/18/2022   Controlled type 2 diabetes mellitus without complication,  without long-term current use of insulin (HCC) 09/30/2022   Rotator cuff arthropathy of left shoulder 09/08/2022   Essential hypertension 09/03/2022   GERD (gastroesophageal reflux disease) 09/03/2022   Hyperlipidemia 09/03/2022   Osteoporosis 09/03/2022   Chronic musculoskeletal pain 09/03/2022   Rotator cuff arthropathy, left 09/03/2022   Screening for lung cancer 09/03/2022   Early satiety 02/22/2018   Loss of weight 02/22/2018   S/P trigger finger release right thumb 11/141/9 02/08/2018   Diarrhea 11/12/2016   Abdominal pain 11/12/2016   Nausea without vomiting 11/12/2016   PCP: Dr. Christel Mormon REFERRING PROVIDER: Dr. Fuller Canada  ONSET DATE: 02/17/23  REFERRING DIAG: W09.811 (ICD-10-CM) - Trigger finger, right middle finger   THERAPY DIAG:  Pain in right finger(s)  Other symptoms and signs involving the musculoskeletal system  Rationale for Evaluation and Treatment: Rehabilitation  SUBJECTIVE:   SUBJECTIVE STATEMENT: S: It's sticking a lot  (her middle finger) Pt accompanied by: self  PERTINENT HISTORY: Pt is a 79 y/o female s/p right 3rd digit trigger finger release on 02/17/23.  Pt reports extension lag and inability to achieve full flexion.   PRECAUTIONS: Other: ROM and progress as tolerated  WEIGHT BEARING RESTRICTIONS: No  PAIN:  Are you having pain? No  FALLS: Has patient fallen in last 6 months? Yes. Number of falls 1  PLOF: Independent  PATIENT GOALS: To be able to use her right hand functionally.   NEXT MD VISIT: 05/15/23  OBJECTIVE:  Note: Objective measures were completed at Evaluation unless otherwise noted.  HAND DOMINANCE: Right  ADLs: Pt reports difficulty gripping and holding objects. Cannot fully extend or flex her middle finger. Pt has difficulty opening jar lids or caps. Pt has pain at incision site when holding objects or pushing on things.   FUNCTIONAL OUTCOME MEASURES: Quick Dash: 25  UPPER EXTREMITY ROM:     Active ROM  Right eval  Index MCP (0-90) 66  Index PIP (0-100) 58  Index DIP (0-70)  46  Long MCP (0-90)  72  Long PIP (0-100)  68  Long DIP (0-70)  42  Ring MCP (0-90) 76   Ring PIP (0-100)  90  Ring DIP (0-70)  46  Little MCP (0-90)  86  Little PIP (0-100)  78  Little DIP (0-70)  58  (Blank rows = not tested)   HAND FUNCTION: Grip strength: Right: 30 lbs; Left: 48 lbs, Lateral pinch: Right: 10 lbs, Left: 11 lbs, and 3 point pinch: Right: 8 lbs, Left: 10 lbs  COORDINATION: 9 Hole Peg test: Right: 25.21" sec; Left: 25.86" sec  SENSATION: WFL  EDEMA:  Right MCPs  Left MCPs 7.25cm  7.25cm  COGNITION: Overall cognitive status: Within functional limits for tasks assessed  OBSERVATIONS: edema and scar tissue at incision site at volar base of 2nd and 3rd digit MCPs   TREATMENT DATE:   04/29/23 -Paraffin bath with moist heat, 10' -Manual Therapy: myofascial release, trigger point, applied to  palmar aspect of hand in order to reduce fascial restrictions and pain, as well as improve ROM -P/ROM: 2nd and 3rd finger flexion to  -Digit ROM: composite flexion, abduction/adduction, opposition, finger taps, x10 -Towel scrunches x10 -wringing out the towel, x10 -Yellow Putty: roll into a ball, flatten into a pancake  04/22/23 -Manual Therapy: myofascial release, trigger point, applied to palmar aspect of hand in order to reduce fascial restrictions and pain, as well as improve ROM -Digit ROM: composite flexion, abduction/adduction, opposition, finger taps, x10 -Towel scrunches x10 -wringing out the towel, x10 -Pinching: yellow resistance clip stacking 6 cubes  Eval -Finger taps: 10 reps -Composite digit flexion/extension, 10 reps -Towel crunch: 10 reps -Digit abduction/adduction, 5 reps                                                                                                                     PATIENT EDUCATION: Education details: Warm water baths Person educated:  Patient Education method: Explanation, Demonstration, and Handouts Education comprehension: verbalized understanding and returned demonstration  HOME EXERCISE PROGRAM: Eval: finger A/ROM 2/5: Towel scrunches and wringing out  GOALS: Goals reviewed with patient? Yes  SHORT TERM GOALS: Target date: 04/29/23  Pt will be provided with and educated on HEP to improve mobility in right hand required for use as dominant during ADL tasks.  Goal status: IN PROGRESS   LONG TERM GOALS: Target date: 05/27/23  Pt will increase A/ROM of right index and middle fingers by 10 degrees to improve ability to make a full fist required for grasping items during ADLs.   Goal status: IN PROGRESS  2.  Pt will decrease pain in right hand to 2/10 or less to improve ability to use the right hand during housekeeping tasks.  Goal status: IN PROGRESS  3.  Pt will decrease right hand scar and fascial restrictions to minimal amounts to improve mobility in hand required for manipulating small objects.    Goal status: IN PROGRESS  4.  Pt will increase right hand grip strength by 15# and pinch strength by 3# to improve ability to grip and open jars and container lids.   Goal status: IN PROGRESS    ASSESSMENT:  CLINICAL IMPRESSION: Pt continues to demonstrate increased tension along the index and middle finger tendons. OT had pt complete the paraffin bath and manual therapy to reduce tightness, however tightness remained with PIP joint popping/trigger with every flexion. Her grip continues to be weak and she had mod difficulty with yellow theraputty work. Verbal and tactile cuing provided for positioning and technique throughout session.   PERFORMANCE DEFICITS: in functional skills including ADLs, IADLs, coordination, dexterity, ROM, strength, pain, fascial restrictions, Fine motor control, and UE functional use   PLAN:  OT FREQUENCY: 1x/week  OT DURATION: 6 weeks  PLANNED INTERVENTIONS: 97168 OT  Re-evaluation, 97535 self care/ADL training, 91478 therapeutic exercise, 97530 therapeutic activity, 97140 manual therapy, 97035 ultrasound, 97018 paraffin, 29562 electrical stimulation unattended, patient/family education, and DME and/or AE instructions  RECOMMENDED OTHER SERVICES:  None at this time  CONSULTED AND AGREED WITH PLAN OF CARE: Patient  PLAN FOR NEXT SESSION: Follow up on HEP, manual techniques, A/ROM, gentle grip/pinch strengthening   Trish Mage, OTR/L [phone] 04/30/2023, 8:59 AM

## 2023-04-30 ENCOUNTER — Ambulatory Visit (HOSPITAL_COMMUNITY): Payer: 59 | Admitting: Physical Therapy

## 2023-04-30 DIAGNOSIS — M12812 Other specific arthropathies, not elsewhere classified, left shoulder: Secondary | ICD-10-CM | POA: Diagnosis not present

## 2023-04-30 DIAGNOSIS — M25512 Pain in left shoulder: Secondary | ICD-10-CM

## 2023-04-30 DIAGNOSIS — R29898 Other symptoms and signs involving the musculoskeletal system: Secondary | ICD-10-CM | POA: Diagnosis not present

## 2023-04-30 DIAGNOSIS — Z96612 Presence of left artificial shoulder joint: Secondary | ICD-10-CM | POA: Diagnosis not present

## 2023-04-30 DIAGNOSIS — M6281 Muscle weakness (generalized): Secondary | ICD-10-CM

## 2023-04-30 DIAGNOSIS — M25612 Stiffness of left shoulder, not elsewhere classified: Secondary | ICD-10-CM

## 2023-04-30 DIAGNOSIS — M79644 Pain in right finger(s): Secondary | ICD-10-CM | POA: Diagnosis not present

## 2023-04-30 NOTE — Therapy (Signed)
OUTPATIENT PHYSICAL THERAPY TREATMENT Patient Name: Leah Lynch MRN: [id number] DOB:[birth year], 78 y.o., female Today's Date: 04/30/2023  PCP: Billie Lade, MD REFERRING PROVIDER: Oliver Barre, MD  END OF SESSION:   PT End of Session - 04/30/23 0802     Visit Number 2    Date for PT Re-Evaluation 06/15/23    Authorization Type [employer] Dual complete    Authorization Time Period no auth; no limit    Progress Note Due on Visit 8    PT Start Time 0802    PT Stop Time 0840    PT Time Calculation (min) 38 min    Activity Tolerance Patient tolerated treatment well    Behavior During Therapy WFL for tasks assessed/performed             Past Medical History:  Diagnosis Date   Fibrocystic breast    GERD (gastroesophageal reflux disease)    Hyperlipidemia    Hypertension    Osteoarthritis    Osteoporosis    PONV (postoperative nausea and vomiting)    Pre-diabetes    Shortness of breath dyspnea    Past Surgical History:  Procedure Laterality Date   ABDOMINAL HYSTERECTOMY     BIOPSY  04/15/2018   Procedure: BIOPSY;  Surgeon: Corbin Ade, MD;  Location: [facility];  Service: Endoscopy;;  colon   BREAST CYST EXCISION Right    BREAST SURGERY     removal of cyst from right breast-fibrocystsic   BUNIONECTOMY  12/19/2010   Procedure: BUNIONECTOMY;  Surgeon: Dallas Schimke;  Location: [facility];  Service: Orthopedics;  Laterality: Right;  [name], [name] Bunionectomy   BUNIONECTOMY  05/22/2011   Procedure: Arbutus Leas;  Surgeon: Dallas Schimke, DPM;  Location: [facility];  Service: Orthopedics;  Laterality: Left;  [name] Bunionectomy Left Foot   CAPSULOTOMY  01/29/2012   Procedure: CAPSULOTOMY;  Surgeon: Dallas Schimke, DPM;  Location: [facility];  Service: Orthopedics;  Laterality: Left;   CATARACT EXTRACTION W/PHACO Right 08/09/2015   Procedure: CATARACT EXTRACTION PHACO AND INTRAOCULAR LENS PLACEMENT RIGHT EYE CDE=7.95;  Surgeon: Gemma Payor, MD;  Location: [facility];  Service: Ophthalmology;  Laterality: Right;   CATARACT EXTRACTION W/PHACO Left 08/27/2015   Procedure: CATARACT EXTRACTION PHACO AND INTRAOCULAR LENS PLACEMENT LEFT EYE; CDE:  6.12;  Surgeon: Gemma Payor, MD;  Location: [facility];  Service: Ophthalmology;  Laterality: Left;   COLONOSCOPY  2012   Dr. Jena Gauss: internal hemorrhoids, hyperplastic polyps. Surveillance 2022   COLONOSCOPY WITH PROPOFOL N/A 04/15/2018   one 7 mm polyp at splenic flexure, one 4 mm polyp in rectum, otherwise normal. S/p segmental biopsy:normal. Normal TI   DIAGNOSTIC LAPAROSCOPY     ESOPHAGOGASTRODUODENOSCOPY (EGD) WITH PROPOFOL N/A 04/15/2018   normal   FOOT GANGLION EXCISION     right foot   METATARSAL OSTEOTOMY  05/22/2011   Procedure: METATARSAL OSTEOTOMY;  Surgeon: Dallas Schimke, DPM;  Location: [facility];  Service: Orthopedics;  Laterality: Left;  [name] Osteotomy Left Foot   POLYPECTOMY  04/15/2018   Procedure: POLYPECTOMY;  Surgeon: Corbin Ade, MD;  Location: [facility];  Service: Endoscopy;;  colon   REVERSE SHOULDER ARTHROPLASTY Left 09/08/2022   Procedure: LEFT REVERSE SHOULDER ARTHROPLASTY;  Surgeon: Oliver Barre, MD;  Location: [facility];  Service: Orthopedics;  Laterality: Left;  RNFA NEEDED   TONSILLECTOMY  age 70   TRIGGER FINGER RELEASE Right 01/28/2018   Procedure: RIGHT TRIGGER THUMB RELEASE;  Surgeon: Vickki Hearing, MD;  Location:  [facility];  Service: Orthopedics;  Laterality: Right;   TRIGGER FINGER RELEASE Right 02/17/2023   Procedure: RELEASE TRIGGER FINGER/A-1 PULLEY;  Surgeon: Vickki Hearing, MD;  Location: [facility];  Service: Orthopedics;  Laterality: Right;   TUBAL LIGATION     Patient Active Problem List   Diagnosis Date Noted   Need for pneumococcal 20-valent conjugate vaccination 02/19/2023   Trigger finger, right middle finger 02/17/2023   Need for influenza vaccination 11/18/2022   Controlled type 2 diabetes mellitus without complication,  without long-term current use of insulin (HCC) 09/30/2022   Rotator cuff arthropathy of left shoulder 09/08/2022   Essential hypertension 09/03/2022   GERD (gastroesophageal reflux disease) 09/03/2022   Hyperlipidemia 09/03/2022   Osteoporosis 09/03/2022   Chronic musculoskeletal pain 09/03/2022   Rotator cuff arthropathy, left 09/03/2022   Screening for lung cancer 09/03/2022   Early satiety 02/22/2018   Loss of weight 02/22/2018   S/P trigger finger release right thumb 11/141/9 02/08/2018   Diarrhea 11/12/2016   Abdominal pain 11/12/2016   Nausea without vomiting 11/12/2016    ONSET DATE: Billie Lade, MD  REFERRING DIAG: Oliver Barre, MD  THERAPY DIAG:  Status post reverse total arthroplasty of left shoulder  Muscle weakness (generalized)  Stiffness of left shoulder, not elsewhere classified  Acute pain of left shoulder  Rationale for Evaluation and Treatment: Rehabilitation  SUBJECTIVE:   SUBJECTIVE STATEMENT: Pt states she didn't take her meds this morning since was driving here and already felt drowsy.  Currently she is not having any pain in her Lt shoulder, only in end range of motion and with certain activities.  Reports she still does the exercises given to her by OT when she came for Lt shoulder therapy last year. States she had OT yesterday for her hand.     Evaluation: Pt had R TSA reverse in June of 2024. Pt continues with limited ROM and wants to improve it. Pt reports limitations hair brushing, limitations in cutting her own hair.  Difficulty reporting putting her hand behind her back. Pt reports she doesn't have the strength to hold on to heavy objects when lifting overhead. Reports mild numbness in left anterior shoulder region.   Pt accompanied by: self  PERTINENT HISTORY:   Left reverse TSA on 09/08/2022  PRECAUTIONS: None  RED FLAGS: None   WEIGHT BEARING RESTRICTIONS: No  PAIN:  Are you having pain? Yes: NPRS scale: 7/10 worst at End  ROM Pain location: Left anterior-lateral shoulder region  Pain description: Sharp Aggravating factors: Overhead lifting Relieving factors: avoiding lifting  FALLS: Has patient fallen in last 6 months? Yes. Number of falls 1   PATIENT GOALS: "get it up a little bit higher with reduced pain"  OBJECTIVE:  Note: Objective measures were completed at Evaluation unless otherwise noted.  HAND DOMINANCE: Right  ADLs: Overall ADLs: modified independent, limitations with hair brushing Transfers/ambulation related to ADLs: Independent Grooming: limited Upper body dressing: independent Toileting: independent Bathing: independent  FUNCTIONAL OUTCOME MEASURES: Quick Dash: 15.9/100= 15.9%  UPPER EXTREMITY ROM:     Active ROM Right eval Left eval  Shoulder flexion 160 105A; 125P  Shoulder abduction 150 90A; 120P  Shoulder adduction    Shoulder extension    Shoulder internal rotation    Shoulder external rotation 75 15A; 25P  Elbow flexion    Elbow extension    Wrist flexion    Wrist extension    Wrist ulnar deviation    Wrist radial deviation  Wrist pronation    Wrist supination    (Blank rows = not tested)   UPPER EXTREMITY MMT:     MMT Right eval Left eval  Shoulder flexion 4- 2+  Shoulder abduction 4- 2+  Shoulder adduction    Shoulder extension    Shoulder internal rotation    Shoulder external rotation 3+ 2+  Middle trapezius 3+ 3+  Lower trapezius 3 2  Elbow flexion    Elbow extension    Wrist flexion    Wrist extension    Wrist ulnar deviation    Wrist radial deviation    Wrist pronation    Wrist supination    (Blank rows = not tested)   SENSATION: WFL  COGNITION: Overall cognitive status: Within functional limits for tasks assessed   TREATMENT DATE:  04/30/23 Goal review Seated:  Lt UE flexion 10X  Lt UE abduction 10X  Lt UE ER 10X  Lt empty can 10X  Scapular retractions 10X  Shoulder rolls 10X Rt sidelying Lt abduction 10X  LT ER  10X Supine:  wand flexion 10X  Wand abduction 10X   04/20/2023 PT Evaluation    And HEP Below                                                                                                                            PATIENT EDUCATION: Education details: PT Evaluation, findings, prognosis, frequency, attendance policy, and HEP if given.  Person educated: Patient Education method: Explanation, Demonstration, Tactile cues, and Verbal cues Education comprehension: verbalized understanding and returned demonstration  HOME EXERCISE PROGRAM: Access Code: [email or web address] URL: [email or web address] Date: 04/20/2023 Prepared by: Starling Manns  Exercises - Seated Ankle Pumps  - 1 x daily - 7 x weekly - 2-3 sets - 20 reps - Seated Long Arc Quad  - 1 x daily - 7 x weekly - 2-3 sets - 20 reps - Supine Bridge  - 1 x daily - 7 x weekly - 2-3 sets - 10-12 reps - Supine Heel Slide  - 1 x daily - 7 x weekly - 2-3 sets - 20 reps - Supine Quad Set  - 1 x daily - 7 x weekly - 2-3 sets - 20 reps  GOALS: Goals reviewed with patient? Yes  SHORT TERM GOALS: Target date: 05/18/2023  Pt will be independent with HEP in order to demonstrate participation in Physical Therapy POC.  Baseline: Goal status: IN PROGRESS  2.  Pt will 4/10 pain during mobility in order to demonstrate improved pain with functional activities.  Baseline:  Goal status: IN PROGRESS  LONG TERM GOALS: Target date: 06/15/2023  Pt will improve Shoulder flexion MMT by >1/2 grade in order to demonstrate improved functional strength to return to desired activities.  Baseline: See objective.  Goal status: IN PROGRESS  2.  Pt will improve Shoulder Abduction MMT by >1/2 grade in order to demonstrate improved functional ambulatory capacity in community setting.  Baseline: See objective.  Goal status:  IN PROGRESS  3.  Pt will improve QuickDASH score by 3 points in order to demonstrate improved pain with functional goals and  outcomes. Baseline: See objective.  Goal status: IN PROGRESS  4.  Pt will improve LUE AROM by 10 degrees in order to improve capacity during functional overhead activities.. Baseline: See objective.  Goal status: IN PROGRESS  5.  Pt will 2/10 pain during mobility in order to demonstrate improved pain with functional activities.  Baseline:  Goal status: IN PROGRESS  ASSESSMENT:  CLINICAL IMPRESSION: Reviewed goals and POC moving forward.  Began with seated Lt UE AROM and postural strengthening.  Pt reportedly has a lot of these already at home so requested she bring in her booklet of exercises we have given so we can streamline her HEP. AAROM in sidelying and supine with use of wand.  Pt overall able to complete with good form.  Cues for increased hold times and reducing substitution with some exercises.   Pt will continue to benefit from skilled therapy to address her deficits and improve her overall function.  Evaluation: Pt is demonstrating moderate ADL limitations, overhead reaching limitations due to muscle weakness and ROM in left shoulder. Prognosis in progression due to timeline from surgery on date in 08/2022. [name] Pt will benefit from skilled Physical Therapy services to address deficits/limitations in order to improve functional and QOL.    PERFORMANCE DEFICITS: in functional skills including ADLs, ROM, strength, and pain,.   IMPAIRMENTS: are limiting patient from ADLs and education.   COMORBIDITIES: has no other co-morbidities that affects occupational performance. Patient will benefit from skilled OT to address above impairments and improve overall function.  MODIFICATION OR ASSISTANCE TO COMPLETE EVALUATION: No modification of tasks or assist necessary to complete an evaluation.  OT OCCUPATIONAL PROFILE AND HISTORY: Problem focused assessment: Including review of records relating to presenting problem.  CLINICAL DECISION MAKING: LOW - limited treatment options, no task  modification necessary  REHAB POTENTIAL: Fair > 6 months from surgery of total reverse shoulder arthroplasty  EVALUATION COMPLEXITY: Low   PLAN:  PT FREQUENCY: 1x/week  PT DURATION: 8 weeks  PLANNED INTERVENTIONS: 97535 self care/ADL training, 40981 therapeutic exercise, 97530 therapeutic activity, 97112 neuromuscular re-education, 97140 manual therapy, [id number] electrical stimulation (manual), 97014 electrical stimulation unattended, 97102 mechanical traction, passive range of motion, and 97164 PT-Re-evaluation    CONSULTED AND AGREED WITH PLAN OF CARE: Patient  PLAN FOR NEXT SESSION: isolated L shoulder flexion/abduction and ER strengthening.  Establish strong HEP; requested pt to bring in all exercises given to her by OT that she is still completing to review next session.   Bascom Levels, Demetruis Depaul B, PTA 04/30/2023, 8:03 AM

## 2023-05-01 DIAGNOSIS — M539 Dorsopathy, unspecified: Secondary | ICD-10-CM | POA: Diagnosis not present

## 2023-05-01 DIAGNOSIS — I10 Essential (primary) hypertension: Secondary | ICD-10-CM | POA: Diagnosis not present

## 2023-05-01 DIAGNOSIS — F17210 Nicotine dependence, cigarettes, uncomplicated: Secondary | ICD-10-CM | POA: Diagnosis not present

## 2023-05-01 DIAGNOSIS — Z79899 Other long term (current) drug therapy: Secondary | ICD-10-CM | POA: Diagnosis not present

## 2023-05-06 ENCOUNTER — Encounter (HOSPITAL_COMMUNITY): Payer: 59 | Admitting: Occupational Therapy

## 2023-05-06 ENCOUNTER — Encounter (HOSPITAL_COMMUNITY): Payer: 59

## 2023-05-08 ENCOUNTER — Ambulatory Visit (HOSPITAL_COMMUNITY): Payer: 59

## 2023-05-08 ENCOUNTER — Encounter (HOSPITAL_COMMUNITY): Payer: Self-pay | Admitting: Occupational Therapy

## 2023-05-08 ENCOUNTER — Ambulatory Visit (HOSPITAL_COMMUNITY): Payer: 59 | Admitting: Occupational Therapy

## 2023-05-08 ENCOUNTER — Encounter (HOSPITAL_COMMUNITY): Payer: Self-pay

## 2023-05-08 DIAGNOSIS — M25512 Pain in left shoulder: Secondary | ICD-10-CM

## 2023-05-08 DIAGNOSIS — M25612 Stiffness of left shoulder, not elsewhere classified: Secondary | ICD-10-CM

## 2023-05-08 DIAGNOSIS — M6281 Muscle weakness (generalized): Secondary | ICD-10-CM

## 2023-05-08 DIAGNOSIS — Z96612 Presence of left artificial shoulder joint: Secondary | ICD-10-CM

## 2023-05-08 DIAGNOSIS — R29898 Other symptoms and signs involving the musculoskeletal system: Secondary | ICD-10-CM | POA: Diagnosis not present

## 2023-05-08 DIAGNOSIS — M12812 Other specific arthropathies, not elsewhere classified, left shoulder: Secondary | ICD-10-CM | POA: Diagnosis not present

## 2023-05-08 DIAGNOSIS — M79644 Pain in right finger(s): Secondary | ICD-10-CM | POA: Diagnosis not present

## 2023-05-08 NOTE — Therapy (Signed)
OUTPATIENT PHYSICAL THERAPY TREATMENT Patient Name: Leah Lynch MRN: [id number] DOB:1945/10/17, 78 y.o., female Today's Date: 05/08/2023  PCP: Billie Lade, MD REFERRING PROVIDER: Oliver Barre, MD  END OF SESSION:   PT End of Session - 05/08/23 1114     Visit Number 3    Number of Visits 8    Date for PT Re-Evaluation 06/15/23    Authorization Type [employer] Dual complete    Authorization Time Period no auth; no limit    Progress Note Due on Visit 8    PT Start Time 1020    PT Stop Time 1100    PT Time Calculation (min) 40 min    Activity Tolerance Patient tolerated treatment well    Behavior During Therapy WFL for tasks assessed/performed              Past Medical History:  Diagnosis Date   Fibrocystic breast    GERD (gastroesophageal reflux disease)    Hyperlipidemia    Hypertension    Osteoarthritis    Osteoporosis    PONV (postoperative nausea and vomiting)    Pre-diabetes    Shortness of breath dyspnea    Past Surgical History:  Procedure Laterality Date   ABDOMINAL HYSTERECTOMY     BIOPSY  04/15/2018   Procedure: BIOPSY;  Surgeon: Corbin Ade, MD;  [facility];  Service: Endoscopy;;  colon   BREAST CYST EXCISION Right    BREAST SURGERY     removal of cyst from right breast-fibrocystsic   BUNIONECTOMY  12/19/2010   Procedure: BUNIONECTOMY;  Surgeon: Dallas Schimke;  Location: [facility];  Service: Orthopedics;  Laterality: Right;  [name], [name] Bunionectomy   BUNIONECTOMY  05/22/2011   Procedure: Arbutus Leas;  Surgeon: Dallas Schimke, DPM;  Location: [facility];  Service: Orthopedics;  Laterality: Left;  [name] Bunionectomy Left Foot   CAPSULOTOMY  01/29/2012   Procedure: CAPSULOTOMY;  Surgeon: Dallas Schimke, DPM;  Location: [facility];  Service: Orthopedics;  Laterality: Left;   CATARACT EXTRACTION W/PHACO Right 08/09/2015   Procedure: CATARACT EXTRACTION PHACO AND INTRAOCULAR LENS PLACEMENT RIGHT EYE  CDE=7.95;  Surgeon: Gemma Payor, MD;  Location: [facility];  Service: Ophthalmology;  Laterality: Right;   CATARACT EXTRACTION W/PHACO Left 08/27/2015   Procedure: CATARACT EXTRACTION PHACO AND INTRAOCULAR LENS PLACEMENT LEFT EYE; CDE:  6.12;  Surgeon: Gemma Payor, MD;  Location: [facility];  Service: Ophthalmology;  Laterality: Left;   COLONOSCOPY  2012   Dr. Jena Gauss: internal hemorrhoids, hyperplastic polyps. Surveillance 2022   COLONOSCOPY WITH PROPOFOL N/A 04/15/2018   one 7 mm polyp at splenic flexure, one 4 mm polyp in rectum, otherwise normal. S/p segmental biopsy:normal. Normal TI   DIAGNOSTIC LAPAROSCOPY     ESOPHAGOGASTRODUODENOSCOPY (EGD) WITH PROPOFOL N/A 04/15/2018   normal   FOOT GANGLION EXCISION     right foot   METATARSAL OSTEOTOMY  05/22/2011   Procedure: METATARSAL OSTEOTOMY;  Surgeon: Dallas Schimke, DPM;  Location: [facility];  Service: Orthopedics;  Laterality: Left;  [name] Osteotomy Left Foot   POLYPECTOMY  04/15/2018   Procedure: POLYPECTOMY;  Surgeon: Corbin Ade, MD;  [facility];  Service: Endoscopy;;  colon   REVERSE SHOULDER ARTHROPLASTY Left 09/08/2022   Procedure: LEFT REVERSE SHOULDER ARTHROPLASTY;  Surgeon: Oliver Barre, MD;  Location: [facility];  Service: Orthopedics;  Laterality: Left;  RNFA NEEDED   TONSILLECTOMY  age 9   TRIGGER FINGER RELEASE Right 01/28/2018   Procedure: RIGHT TRIGGER THUMB RELEASE;  Surgeon: Vickki Hearing, MD;  Location: [facility];  Service: Orthopedics;  Laterality: Right;   TRIGGER FINGER RELEASE Right 02/17/2023   Procedure: RELEASE TRIGGER FINGER/A-1 PULLEY;  Surgeon: Vickki Hearing, MD;  Location: [facility];  Service: Orthopedics;  Laterality: Right;   TUBAL LIGATION     Patient Active Problem List   Diagnosis Date Noted   Need for pneumococcal 20-valent conjugate vaccination 02/19/2023   Trigger finger, right middle finger 02/17/2023   Need for influenza vaccination 11/18/2022   Controlled type 2 diabetes mellitus  without complication, without long-term current use of insulin (HCC) 09/30/2022   Rotator cuff arthropathy of left shoulder 09/08/2022   Essential hypertension 09/03/2022   GERD (gastroesophageal reflux disease) 09/03/2022   Hyperlipidemia 09/03/2022   Osteoporosis 09/03/2022   Chronic musculoskeletal pain 09/03/2022   Rotator cuff arthropathy, left 09/03/2022   Screening for lung cancer 09/03/2022   Early satiety 02/22/2018   Loss of weight 02/22/2018   S/P trigger finger release right thumb 11/141/9 02/08/2018   Diarrhea 11/12/2016   Abdominal pain 11/12/2016   Nausea without vomiting 11/12/2016    ONSET DATE: Billie Lade, MD  REFERRING DIAG: Oliver Barre, MD next apt: 05/15/23  THERAPY DIAG:  Status post reverse total arthroplasty of left shoulder  Muscle weakness (generalized)  Stiffness of left shoulder, not elsewhere classified  Acute pain of left shoulder  Rationale for Evaluation and Treatment: Rehabilitation  SUBJECTIVE:   SUBJECTIVE STATEMENT: 05/08/23:  Pt brought OT HEP exercises copies with her todaty.  Reports main pain with end range of motion abduction.  No reports of pain at rest.  Evaluation: Pt had R TSA reverse in June of 2024. Pt continues with limited ROM and wants to improve it. Pt reports limitations hair brushing, limitations in cutting her own hair.  Difficulty reporting putting her hand behind her back. Pt reports she doesn't have the strength to hold on to heavy objects when lifting overhead. Reports mild numbness in left anterior shoulder region.   Pt accompanied by: self  PERTINENT HISTORY:   Left reverse TSA on 09/08/2022  PRECAUTIONS: None  RED FLAGS: None   WEIGHT BEARING RESTRICTIONS: No  PAIN:  Are you having pain? Yes: NPRS scale: 7/10 worst at End ROM Pain location: Left anterior-lateral shoulder region  Pain description: Sharp Aggravating factors: Overhead lifting Relieving factors: avoiding lifting  FALLS: Has  patient fallen in last 6 months? Yes. Number of falls 1   PATIENT GOALS: "get it up a little bit higher with reduced pain"  OBJECTIVE:  Note: Objective measures were completed at Evaluation unless otherwise noted.  HAND DOMINANCE: Right  ADLs: Overall ADLs: modified independent, limitations with hair brushing Transfers/ambulation related to ADLs: Independent Grooming: limited Upper body dressing: independent Toileting: independent Bathing: independent  FUNCTIONAL OUTCOME MEASURES: Quick Dash: 15.9/100= 15.9%  UPPER EXTREMITY ROM:     Active ROM Right eval Left eval  Shoulder flexion 160 105A; 125P  Shoulder abduction 150 90A; 120P  Shoulder adduction    Shoulder extension    Shoulder internal rotation    Shoulder external rotation 75 15A; 25P  Elbow flexion    Elbow extension    Wrist flexion    Wrist extension    Wrist ulnar deviation    Wrist radial deviation    Wrist pronation    Wrist supination    (Blank rows = not tested)   UPPER EXTREMITY MMT:     MMT Right eval Left eval  Shoulder flexion 4-  2+  Shoulder abduction 4- 2+  Shoulder adduction    Shoulder extension    Shoulder internal rotation    Shoulder external rotation 3+ 2+  Middle trapezius 3+ 3+  Lower trapezius 3 2  Elbow flexion    Elbow extension    Wrist flexion    Wrist extension    Wrist ulnar deviation    Wrist radial deviation    Wrist pronation    Wrist supination    (Blank rows = not tested)   SENSATION: WFL  COGNITION: Overall cognitive status: Within functional limits for tasks assessed   TREATMENT DATE:  05/08/23: Reviewed current OT HEP  Manual: PROM, STM to anterior shoulder/pecs, scapular mobs, scar tissue mobilization  Standing: Isometric flexion, abd and ER, extension: 5x 5"  Seated theraball flexion, abduction, IR, ER with some assistance behind chair.  04/30/23 Goal review Seated:  Lt UE flexion 10X  Lt UE abduction 10X  Lt UE ER 10X  Lt empty can  10X  Scapular retractions 10X  Shoulder rolls 10X Rt sidelying Lt abduction 10X  LT ER 10X Supine:  wand flexion 10X  Wand abduction 10X   04/20/2023 PT Evaluation    And HEP Below                                                                                                                            PATIENT EDUCATION: Education details: PT Evaluation, findings, prognosis, frequency, attendance policy, and HEP if given.  Person educated: Patient Education method: Explanation, Demonstration, Tactile cues, and Verbal cues Education comprehension: verbalized understanding and returned demonstration  HOME EXERCISE PROGRAM: Access Code: [email or web address] URL: [email or web address] Date: 05/08/2023 Prepared by: Becky Sax  Exercises - Sidelying Shoulder External Rotation  - 1 x daily - 7 x weekly - 3 sets - 12-15 reps - Sidelying Shoulder Abduction Palm Forward  - 1 x daily - 7 x weekly - 3 sets - 12-15 reps - Sidelying Shoulder Flexion 15 Degrees  - 1 x daily - 7 x weekly - 3 sets - 12-15 reps  OT HEP includes wall slides, shoulder protraction/retractions, horizontal abduction, sidelying ER/abduction, towel stretch for IR, corner stretch, posterior capsule stretch, ER stretch, wand exercises, jumping jack, scapular retraction, Vslide with theraband, theraband: protract, horizontal abd, IR, ER, flexoin, abduction  GOALS: Goals reviewed with patient? Yes  SHORT TERM GOALS: Target date: 05/18/2023  Pt will be independent with HEP in order to demonstrate participation in Physical Therapy POC.  Baseline: Goal status: IN PROGRESS  2.  Pt will 4/10 pain during mobility in order to demonstrate improved pain with functional activities.  Baseline:  Goal status: IN PROGRESS  LONG TERM GOALS: Target date: 06/15/2023  Pt will improve Shoulder flexion MMT by >1/2 grade in order to demonstrate improved functional strength to return to desired activities.  Baseline: See objective.   Goal status: IN PROGRESS  2.  Pt will improve Shoulder Abduction MMT by >1/2 grade in order to  demonstrate improved functional ambulatory capacity in community setting.  Baseline: See objective.  Goal status: IN PROGRESS  3.  Pt will improve QuickDASH score by 3 points in order to demonstrate improved pain with functional goals and outcomes. Baseline: See objective.  Goal status: IN PROGRESS  4.  Pt will improve LUE AROM by 10 degrees in order to improve capacity during functional overhead activities.. Baseline: See objective.  Goal status: IN PROGRESS  5.  Pt will 2/10 pain during mobility in order to demonstrate improved pain with functional activities.  Baseline:  Goal status: IN PROGRESS  ASSESSMENT:  CLINICAL IMPRESSION: Pt brought in notebook with OT HEP (listed above) and reviewed what she is doing currently.  Added manual to address PROM, scar tissue adhesions, scapular mob and trigger point pec release.  Added wall isometrics for strengthening and theraball exercise for AAROM with some assistance required during IR/ER around the chair.  Pt tolerated well to session with no reports of pain.  Some cueing required to reduce compensation with some exercises.    Evaluation: Pt is demonstrating moderate ADL limitations, overhead reaching limitations due to muscle weakness and ROM in left shoulder. Prognosis in progression due to timeline from surgery on date in 08/2022. [name] Pt will benefit from skilled Physical Therapy services to address deficits/limitations in order to improve functional and QOL.    PERFORMANCE DEFICITS: in functional skills including ADLs, ROM, strength, and pain,.   IMPAIRMENTS: are limiting patient from ADLs and education.   COMORBIDITIES: has no other co-morbidities that affects occupational performance. Patient will benefit from skilled OT to address above impairments and improve overall function.  MODIFICATION OR ASSISTANCE TO COMPLETE EVALUATION: No  modification of tasks or assist necessary to complete an evaluation.  OT OCCUPATIONAL PROFILE AND HISTORY: Problem focused assessment: Including review of records relating to presenting problem.  CLINICAL DECISION MAKING: LOW - limited treatment options, no task modification necessary  REHAB POTENTIAL: Fair > 6 months from surgery of total reverse shoulder arthroplasty  EVALUATION COMPLEXITY: Low   PLAN:  PT FREQUENCY: 1x/week  PT DURATION: 8 weeks  PLANNED INTERVENTIONS: 97535 self care/ADL training, 21308 therapeutic exercise, 97530 therapeutic activity, 97112 neuromuscular re-education, 97140 manual therapy, [id number] electrical stimulation (manual), 97014 electrical stimulation unattended, 97102 mechanical traction, passive range of motion, and 97164 PT-Re-evaluation    CONSULTED AND AGREED WITH PLAN OF CARE: Patient  PLAN FOR NEXT SESSION: isolated L shoulder flexion/abduction and ER strengthening.  Establish strong HEP.  Continue sidelying exercises for strengthening.  Becky Sax, LPTA/CLT; CBIS [phone]  Juel Burrow, PTA 05/08/2023, 2:02 PM

## 2023-05-08 NOTE — Therapy (Signed)
OUTPATIENT OCCUPATIONAL THERAPY ORTHO TREATMENT NOTE  Patient Name: Leah Lynch MRN: [id number] DOB:01-12-1946, 78 y.o., female Today's Date: 05/08/2023    END OF SESSION:  OT End of Session - 05/08/23 1016     Visit Number 4    Number of Visits 6    Date for OT Re-Evaluation 05/27/23    Authorization Type 1) [employer] Medicare Dual Complete  2) [place] Medicaid    Authorization Time Period no visit limit    Progress Note Due on Visit 10    OT Start Time [phone]    OT Stop Time 1016    OT Time Calculation (min) 42 min    Activity Tolerance Patient tolerated treatment well    Behavior During Therapy WFL for tasks assessed/performed             Past Medical History:  Diagnosis Date   Fibrocystic breast    GERD (gastroesophageal reflux disease)    Hyperlipidemia    Hypertension    Osteoarthritis    Osteoporosis    PONV (postoperative nausea and vomiting)    Pre-diabetes    Shortness of breath dyspnea    Past Surgical History:  Procedure Laterality Date   ABDOMINAL HYSTERECTOMY     BIOPSY  04/15/2018   Procedure: BIOPSY;  Surgeon: Corbin Ade, MD;  [facility];  Service: Endoscopy;;  colon   BREAST CYST EXCISION Right    BREAST SURGERY     removal of cyst from right breast-fibrocystsic   BUNIONECTOMY  12/19/2010   Procedure: BUNIONECTOMY;  Surgeon: Dallas Schimke;  [facility]: [facility];  Service: Orthopedics;  Laterality: Right;  [name], [name] Bunionectomy   BUNIONECTOMY  05/22/2011   Procedure: Arbutus Leas;  Surgeon: Dallas Schimke, DPM;  Location: [facility];  Service: Orthopedics;  Laterality: Left;  [name] Bunionectomy Left Foot   CAPSULOTOMY  01/29/2012   Procedure: CAPSULOTOMY;  Surgeon: Dallas Schimke, DPM;  Location: [facility];  Service: Orthopedics;  Laterality: Left;   CATARACT EXTRACTION W/PHACO Right 08/09/2015   Procedure: CATARACT EXTRACTION PHACO AND INTRAOCULAR LENS PLACEMENT RIGHT EYE CDE=7.95;  Surgeon: Gemma Payor, MD;  Location: [facility];  Service: Ophthalmology;  Laterality: Right;   CATARACT EXTRACTION W/PHACO Left 08/27/2015   Procedure: CATARACT EXTRACTION PHACO AND INTRAOCULAR LENS PLACEMENT LEFT EYE; CDE:  6.12;  Surgeon: Gemma Payor, MD;  Location: [facility];  Service: Ophthalmology;  Laterality: Left;   COLONOSCOPY  2012   Dr. Jena Gauss: internal hemorrhoids, hyperplastic polyps. Surveillance 2022   COLONOSCOPY WITH PROPOFOL N/A 04/15/2018   one 7 mm polyp at splenic flexure, one 4 mm polyp in rectum, otherwise normal. S/p segmental biopsy:normal. Normal TI   DIAGNOSTIC LAPAROSCOPY     ESOPHAGOGASTRODUODENOSCOPY (EGD) WITH PROPOFOL N/A 04/15/2018   normal   FOOT GANGLION EXCISION     right foot   METATARSAL OSTEOTOMY  05/22/2011   Procedure: METATARSAL OSTEOTOMY;  Surgeon: Dallas Schimke, DPM;  [facility];  Service: Orthopedics;  Laterality: Left;  [name] Osteotomy Left Foot   POLYPECTOMY  04/15/2018   Procedure: POLYPECTOMY;  Surgeon: Corbin Ade, MD;  [facility];  Service: Endoscopy;;  colon   REVERSE SHOULDER ARTHROPLASTY Left 09/08/2022   Procedure: LEFT REVERSE SHOULDER ARTHROPLASTY;  Surgeon: Oliver Barre, MD;  Location: [facility];  Service: Orthopedics;  Laterality: Left;  RNFA NEEDED   TONSILLECTOMY  age 64   TRIGGER FINGER RELEASE Right 01/28/2018   Procedure: RIGHT TRIGGER THUMB RELEASE;  Surgeon: Fuller Canada  E, MD;  Location: [facility];  Service: Orthopedics;  Laterality: Right;   TRIGGER FINGER RELEASE Right 02/17/2023   Procedure: RELEASE TRIGGER FINGER/A-1 PULLEY;  Surgeon: Vickki Hearing, MD;  Location: [facility];  Service: Orthopedics;  Laterality: Right;   TUBAL LIGATION     Patient Active Problem List   Diagnosis Date Noted   Need for pneumococcal 20-valent conjugate vaccination 02/19/2023   Trigger finger, right middle finger 02/17/2023   Need for influenza vaccination 11/18/2022   Controlled type 2 diabetes mellitus without complication,  without long-term current use of insulin (HCC) 09/30/2022   Rotator cuff arthropathy of left shoulder 09/08/2022   Essential hypertension 09/03/2022   GERD (gastroesophageal reflux disease) 09/03/2022   Hyperlipidemia 09/03/2022   Osteoporosis 09/03/2022   Chronic musculoskeletal pain 09/03/2022   Rotator cuff arthropathy, left 09/03/2022   Screening for lung cancer 09/03/2022   Early satiety 02/22/2018   Loss of weight 02/22/2018   S/P trigger finger release right thumb 11/141/9 02/08/2018   Diarrhea 11/12/2016   Abdominal pain 11/12/2016   Nausea without vomiting 11/12/2016   PCP: Dr. Christel Mormon REFERRING PROVIDER: Dr. Fuller Canada  ONSET DATE: 02/17/23  REFERRING DIAG: Z61.096 (ICD-10-CM) - Trigger finger, right middle finger   THERAPY DIAG:  Pain in right finger(s)  Other symptoms and signs involving the musculoskeletal system  Rationale for Evaluation and Treatment: Rehabilitation  SUBJECTIVE:   SUBJECTIVE STATEMENT: S: It's been really giving me a fit this week Pt accompanied by: self  PERTINENT HISTORY: Pt is a 78 y/o female s/p right 3rd digit trigger finger release on 02/17/23.  Pt reports extension lag and inability to achieve full flexion.   PRECAUTIONS: Other: ROM and progress as tolerated  WEIGHT BEARING RESTRICTIONS: No  PAIN:  Are you having pain? No  FALLS: Has patient fallen in last 6 months? Yes. Number of falls 1  PLOF: Independent  PATIENT GOALS: To be able to use her right hand functionally.   NEXT MD VISIT: 05/15/23  OBJECTIVE:  Note: Objective measures were completed at Evaluation unless otherwise noted.  HAND DOMINANCE: Right  ADLs: Pt reports difficulty gripping and holding objects. Cannot fully extend or flex her middle finger. Pt has difficulty opening jar lids or caps. Pt has pain at incision site when holding objects or pushing on things.   FUNCTIONAL OUTCOME MEASURES: Quick Dash: 25  UPPER EXTREMITY ROM:     Active ROM  Right eval  Index MCP (0-90) 66  Index PIP (0-100) 58  Index DIP (0-70)  46  Long MCP (0-90)  72  Long PIP (0-100)  68  Long DIP (0-70)  42  Ring MCP (0-90) 76   Ring PIP (0-100)  90  Ring DIP (0-70)  46  Little MCP (0-90)  86  Little PIP (0-100)  78  Little DIP (0-70)  58  (Blank rows = not tested)   HAND FUNCTION: Grip strength: Right: 30 lbs; Left: 48 lbs, Lateral pinch: Right: 10 lbs, Left: 11 lbs, and 3 point pinch: Right: 8 lbs, Left: 10 lbs  COORDINATION: 9 Hole Peg test: Right: 25.21" sec; Left: 25.86" sec  SENSATION: WFL  EDEMA:  Right MCPs  Left MCPs 7.25cm  7.25cm  COGNITION: Overall cognitive status: Within functional limits for tasks assessed  OBSERVATIONS: edema and scar tissue at incision site at volar base of 2nd and 3rd digit MCPs   TREATMENT DATE:   05/08/23 -Paraffin bath with moist heat, 10' -Manual Therapy: myofascial release, trigger point, applied  to palmar aspect of hand in order to reduce fascial restrictions and pain, as well as improve ROM -P/ROM: 2nd and 3rd finger flexion to extension -Yellow Putty: roll into a ball, flatten into a pancake, PVC pipe to cut circles, roll into a log, tripod pinch x12, roll into a ball, squeeze x10  04/29/23 -Paraffin bath with moist heat, 10' -Manual Therapy: myofascial release, trigger point, applied to palmar aspect of hand in order to reduce fascial restrictions and pain, as well as improve ROM -P/ROM: 2nd and 3rd finger flexion to extension -Digit ROM: composite flexion, abduction/adduction, opposition, finger taps, x10 -Towel scrunches x10 -wringing out the towel, x10 -Yellow Putty: roll into a ball, flatten into a pancake  04/22/23 -Manual Therapy: myofascial release, trigger point, applied to palmar aspect of hand in order to reduce fascial restrictions and pain, as well as improve ROM -Digit ROM: composite flexion, abduction/adduction, opposition, finger taps, x10 -Towel scrunches x10 -wringing  out the towel, x10 -Pinching: yellow resistance clip stacking 6 cubes                                                      PATIENT EDUCATION: Education details: Continue HEP Person educated: Patient Education method: Programmer, multimedia, Facilities manager, and Handouts Education comprehension: verbalized understanding and returned demonstration  HOME EXERCISE PROGRAM: Eval: finger A/ROM 2/5: Towel scrunches and wringing out  GOALS: Goals reviewed with patient? Yes  SHORT TERM GOALS: Target date: 04/29/23  Pt will be provided with and educated on HEP to improve mobility in right hand required for use as dominant during ADL tasks.  Goal status: IN PROGRESS   LONG TERM GOALS: Target date: 05/27/23  Pt will increase A/ROM of right index and middle fingers by 10 degrees to improve ability to make a full fist required for grasping items during ADLs.   Goal status: IN PROGRESS  2.  Pt will decrease pain in right hand to 2/10 or less to improve ability to use the right hand during housekeeping tasks.  Goal status: IN PROGRESS  3.  Pt will decrease right hand scar and fascial restrictions to minimal amounts to improve mobility in hand required for manipulating small objects.    Goal status: IN PROGRESS  4.  Pt will increase right hand grip strength by 15# and pinch strength by 3# to improve ability to grip and open jars and container lids.   Goal status: IN PROGRESS    ASSESSMENT:  CLINICAL IMPRESSION: This session, pt continues to report her finger getting stuck in flexion and causing increased pain and discomfort. OT continued with paraffin bath and manual therapy in order to reduce pain and tension in the hand in order to assist with making a full fist. She required increased time with theraputty task this session due to pain/discomfort and weakness in her hand. Verbal and visual cuing provided throughout session for positioning and technique.   PERFORMANCE DEFICITS: in functional skills  including ADLs, IADLs, coordination, dexterity, ROM, strength, pain, fascial restrictions, Fine motor control, and UE functional use   PLAN:  OT FREQUENCY: 1x/week  OT DURATION: 6 weeks  PLANNED INTERVENTIONS: 97168 OT Re-evaluation, 97535 self care/ADL training, 66440 therapeutic exercise, 97530 therapeutic activity, 97140 manual therapy, 97035 ultrasound, 97018 paraffin, 34742 electrical stimulation unattended, patient/family education, and DME and/or AE instructions  RECOMMENDED OTHER SERVICES: None at  this time  CONSULTED AND AGREED WITH PLAN OF CARE: Patient  PLAN FOR NEXT SESSION: Follow up on HEP, manual techniques, A/ROM, gentle grip/pinch strengthening   Trish Mage, OTR/L [phone] 05/08/2023, 10:48 AM

## 2023-05-13 ENCOUNTER — Ambulatory Visit (HOSPITAL_COMMUNITY): Payer: 59 | Admitting: Physical Therapy

## 2023-05-13 ENCOUNTER — Encounter (HOSPITAL_COMMUNITY): Payer: Self-pay | Admitting: Occupational Therapy

## 2023-05-13 ENCOUNTER — Ambulatory Visit (HOSPITAL_COMMUNITY): Payer: 59 | Admitting: Occupational Therapy

## 2023-05-13 DIAGNOSIS — R29898 Other symptoms and signs involving the musculoskeletal system: Secondary | ICD-10-CM

## 2023-05-13 DIAGNOSIS — M25612 Stiffness of left shoulder, not elsewhere classified: Secondary | ICD-10-CM | POA: Diagnosis not present

## 2023-05-13 DIAGNOSIS — M25512 Pain in left shoulder: Secondary | ICD-10-CM | POA: Diagnosis not present

## 2023-05-13 DIAGNOSIS — M79644 Pain in right finger(s): Secondary | ICD-10-CM | POA: Diagnosis not present

## 2023-05-13 DIAGNOSIS — M12812 Other specific arthropathies, not elsewhere classified, left shoulder: Secondary | ICD-10-CM | POA: Diagnosis not present

## 2023-05-13 DIAGNOSIS — Z96612 Presence of left artificial shoulder joint: Secondary | ICD-10-CM | POA: Diagnosis not present

## 2023-05-13 DIAGNOSIS — M6281 Muscle weakness (generalized): Secondary | ICD-10-CM | POA: Diagnosis not present

## 2023-05-13 NOTE — Therapy (Signed)
 OUTPATIENT OCCUPATIONAL THERAPY ORTHO TREATMENT NOTE  Patient Name: Leah Lynch MRN: [id number] DOB:08-Jul-1945, 78 y.o., female Today's Date: 05/13/2023    END OF SESSION:  OT End of Session - 05/13/23 1336     Visit Number 5    Number of Visits 6    Date for OT Re-Evaluation 05/27/23    Authorization Type 1) [employer] Medicare Dual Complete  2) [place] Medicaid    Authorization Time Period no visit limit    Progress Note Due on Visit 10    OT Start Time 1300    OT Stop Time 1341    OT Time Calculation (min) 41 min    Activity Tolerance Patient tolerated treatment well    Behavior During Therapy WFL for tasks assessed/performed              Past Medical History:  Diagnosis Date   Fibrocystic breast    GERD (gastroesophageal reflux disease)    Hyperlipidemia    Hypertension    Osteoarthritis    Osteoporosis    PONV (postoperative nausea and vomiting)    Pre-diabetes    Shortness of breath dyspnea    Past Surgical History:  Procedure Laterality Date   ABDOMINAL HYSTERECTOMY     BIOPSY  04/15/2018   Procedure: BIOPSY;  Surgeon: Corbin Ade, MD;  [facility];  Service: Endoscopy;;  colon   BREAST CYST EXCISION Right    BREAST SURGERY     removal of cyst from right breast-fibrocystsic   BUNIONECTOMY  12/19/2010   Procedure: BUNIONECTOMY;  Surgeon: Dallas Schimke;  Location: [facility];  Service: Orthopedics;  Laterality: Right;  [name], [name] Bunionectomy   BUNIONECTOMY  05/22/2011   Procedure: Arbutus Leas;  Surgeon: Dallas Schimke, DPM;  Location: [facility];  Service: Orthopedics;  Laterality: Left;  [name] Bunionectomy Left Foot   CAPSULOTOMY  01/29/2012   Procedure: CAPSULOTOMY;  Surgeon: Dallas Schimke, DPM;  Location: [facility];  Service: Orthopedics;  Laterality: Left;   CATARACT EXTRACTION W/PHACO Right 08/09/2015   Procedure: CATARACT EXTRACTION PHACO AND INTRAOCULAR LENS PLACEMENT RIGHT EYE CDE=7.95;  Surgeon: Gemma Payor, MD;  Location: [facility];  Service: Ophthalmology;  Laterality: Right;   CATARACT EXTRACTION W/PHACO Left 08/27/2015   Procedure: CATARACT EXTRACTION PHACO AND INTRAOCULAR LENS PLACEMENT LEFT EYE; CDE:  6.12;  Surgeon: Gemma Payor, MD;  Location: [facility];  Service: Ophthalmology;  Laterality: Left;   COLONOSCOPY  2012   Dr. Jena Gauss: internal hemorrhoids, hyperplastic polyps. Surveillance 2022   COLONOSCOPY WITH PROPOFOL N/A 04/15/2018   one 7 mm polyp at splenic flexure, one 4 mm polyp in rectum, otherwise normal. S/p segmental biopsy:normal. Normal TI   DIAGNOSTIC LAPAROSCOPY     ESOPHAGOGASTRODUODENOSCOPY (EGD) WITH PROPOFOL N/A 04/15/2018   normal   FOOT GANGLION EXCISION     right foot   METATARSAL OSTEOTOMY  05/22/2011   Procedure: METATARSAL OSTEOTOMY;  Surgeon: Dallas Schimke, DPM;  [facility]: [facility];  Service: Orthopedics;  Laterality: Left;  [name] Osteotomy Left Foot   POLYPECTOMY  04/15/2018   Procedure: POLYPECTOMY;  Surgeon: Corbin Ade, MD;  [facility];  Service: Endoscopy;;  colon   REVERSE SHOULDER ARTHROPLASTY Left 09/08/2022   Procedure: LEFT REVERSE SHOULDER ARTHROPLASTY;  Surgeon: Oliver Barre, MD;  Location: [facility];  Service: Orthopedics;  Laterality: Left;  RNFA NEEDED   TONSILLECTOMY  age 36   TRIGGER FINGER RELEASE Right 01/28/2018   Procedure: RIGHT TRIGGER THUMB RELEASE;  Surgeon: Romeo Apple,  Fernande Boyden, MD;  Location: [facility];  Service: Orthopedics;  Laterality: Right;   TRIGGER FINGER RELEASE Right 02/17/2023   Procedure: RELEASE TRIGGER FINGER/A-1 PULLEY;  Surgeon: Vickki Hearing, MD;  Location: [facility];  Service: Orthopedics;  Laterality: Right;   TUBAL LIGATION     Patient Active Problem List   Diagnosis Date Noted   Need for pneumococcal 20-valent conjugate vaccination 02/19/2023   Trigger finger, right middle finger 02/17/2023   Need for influenza vaccination 11/18/2022   Controlled type 2 diabetes mellitus without complication,  without long-term current use of insulin (HCC) 09/30/2022   Rotator cuff arthropathy of left shoulder 09/08/2022   Essential hypertension 09/03/2022   GERD (gastroesophageal reflux disease) 09/03/2022   Hyperlipidemia 09/03/2022   Osteoporosis 09/03/2022   Chronic musculoskeletal pain 09/03/2022   Rotator cuff arthropathy, left 09/03/2022   Screening for lung cancer 09/03/2022   Early satiety 02/22/2018   Loss of weight 02/22/2018   S/P trigger finger release right thumb 11/141/9 02/08/2018   Diarrhea 11/12/2016   Abdominal pain 11/12/2016   Nausea without vomiting 11/12/2016   PCP: Dr. Christel Mormon REFERRING PROVIDER: Dr. Fuller Canada  ONSET DATE: 02/17/23  REFERRING DIAG: Q65.784 (ICD-10-CM) - Trigger finger, right middle finger   THERAPY DIAG:  Pain in right finger(s)  Other symptoms and signs involving the musculoskeletal system  Rationale for Evaluation and Treatment: Rehabilitation  SUBJECTIVE:   SUBJECTIVE STATEMENT: S: I still can't extend this finger all the way.   PERTINENT HISTORY: Pt is a 78 y/o female s/p right 3rd digit trigger finger release on 02/17/23.  Pt reports extension lag and inability to achieve full flexion.   PRECAUTIONS: Other: ROM and progress as tolerated  WEIGHT BEARING RESTRICTIONS: No  PAIN:  Are you having pain? No  FALLS: Has patient fallen in last 6 months? Yes. Number of falls 1  PLOF: Independent  PATIENT GOALS: To be able to use her right hand functionally.   NEXT MD VISIT: 05/15/23  OBJECTIVE:  Note: Objective measures were completed at Evaluation unless otherwise noted.  HAND DOMINANCE: Right  ADLs: Pt reports difficulty gripping and holding objects. Cannot fully extend or flex her middle finger. Pt has difficulty opening jar lids or caps. Pt has pain at incision site when holding objects or pushing on things.   FUNCTIONAL OUTCOME MEASURES: Quick Dash: 25  UPPER EXTREMITY ROM:     Active ROM Right eval  Index  MCP (0-90) 66  Index PIP (0-100) 58  Index DIP (0-70)  46  Long MCP (0-90)  72  Long PIP (0-100)  68  Long DIP (0-70)  42  Ring MCP (0-90) 76   Ring PIP (0-100)  90  Ring DIP (0-70)  46  Little MCP (0-90)  86  Little PIP (0-100)  78  Little DIP (0-70)  58  (Blank rows = not tested)   HAND FUNCTION: Grip strength: Right: 30 lbs; Left: 48 lbs, Lateral pinch: Right: 10 lbs, Left: 11 lbs, and 3 point pinch: Right: 8 lbs, Left: 10 lbs  COORDINATION: 9 Hole Peg test: Right: 25.21" sec; Left: 25.86" sec  SENSATION: WFL  EDEMA:  Right MCPs  Left MCPs 7.25cm  7.25cm  COGNITION: Overall cognitive status: Within functional limits for tasks assessed  OBSERVATIONS: edema and scar tissue at incision site at volar base of 2nd and 3rd digit MCPs   TREATMENT DATE:  05/13/23 -Manual Therapy: myofascial release to palmar aspect of hand in order to reduce fascial restrictions and  pain, as well as improve ROM -Composite digit flexion and extension, 10 reps -Finger taps: 10 reps-able to lift 3rd digit off the table into extension approximately 1 cm -Grip strengthening: large, medium, and small beads hand gripper at 25# resistance, vertical -Red theraputty: rolling into a ball, flatten, rolling, pinching-lateral and 3 point; 2 rounds -Pinch strengthening: pt using red and green clothespin and 3 point pinch to grasp and stack high resistance sponges, then using lateral pinch to replace sponges into the bucket. Pt completing 4 towers of 5 sponges-2 with red clothespin and 2 with green clothespin  05/08/23 -Paraffin bath with moist heat, 10' -Manual Therapy: myofascial release, trigger point, applied to palmar aspect of hand in order to reduce fascial restrictions and pain, as well as improve ROM -P/ROM: 2nd and 3rd finger flexion to extension -Yellow Putty: roll into a ball, flatten into a pancake, PVC pipe to cut circles, roll into a log, tripod pinch x12, roll into a ball, squeeze  x10  04/29/23 -Paraffin bath with moist heat, 10' -Manual Therapy: myofascial release, trigger point, applied to palmar aspect of hand in order to reduce fascial restrictions and pain, as well as improve ROM -P/ROM: 2nd and 3rd finger flexion to extension -Digit ROM: composite flexion, abduction/adduction, opposition, finger taps, x10 -Towel scrunches x10 -wringing out the towel, x10 -Yellow Putty: roll into a ball, flatten into a pancake                                                    PATIENT EDUCATION: Education details: theraputty grip and pinch, red Person educated: Patient Education method: Programmer, multimedia, Demonstration, and Handouts Education comprehension: verbalized understanding and returned demonstration  HOME EXERCISE PROGRAM: Eval: finger A/ROM 2/5: Towel scrunches and wringing out 2/26: theraputty grip and pinch, red  GOALS: Goals reviewed with patient? Yes  SHORT TERM GOALS: Target date: 04/29/23  Pt will be provided with and educated on HEP to improve mobility in right hand required for use as dominant during ADL tasks.  Goal status: IN PROGRESS   LONG TERM GOALS: Target date: 05/27/23  Pt will increase A/ROM of right index and middle fingers by 10 degrees to improve ability to make a full fist required for grasping items during ADLs.   Goal status: IN PROGRESS  2.  Pt will decrease pain in right hand to 2/10 or less to improve ability to use the right hand during housekeeping tasks.  Goal status: IN PROGRESS  3.  Pt will decrease right hand scar and fascial restrictions to minimal amounts to improve mobility in hand required for manipulating small objects.    Goal status: IN PROGRESS  4.  Pt will increase right hand grip strength by 15# and pinch strength by 3# to improve ability to grip and open jars and container lids.   Goal status: IN PROGRESS    ASSESSMENT:  CLINICAL IMPRESSION: Pt reporting continued difficulty with full extension. Pt able to  extend finger during finger taps and extend past neutral with max effort. Progressed to hand gripper for strengthening, upgraded to red theraputty and added to HEP. Pt with mild extension lag during tasks. Discussed what the lag is preventing functionally at home, pt reporting difficulty scratching her back and scrubbing things. Discussed as pt regains strength these tasks will improve. Unable to identify any tasks that are prevented  due to extension lag of 3rd digit. Verbal and visual cuing provided throughout session for positioning and technique.   PERFORMANCE DEFICITS: in functional skills including ADLs, IADLs, coordination, dexterity, ROM, strength, pain, fascial restrictions, Fine motor control, and UE functional use   PLAN:  OT FREQUENCY: 1x/week  OT DURATION: 6 weeks  PLANNED INTERVENTIONS: 97168 OT Re-evaluation, 97535 self care/ADL training, 16109 therapeutic exercise, 97530 therapeutic activity, 97140 manual therapy, 97035 ultrasound, 97018 paraffin, 60454 electrical stimulation unattended, patient/family education, and DME and/or AE instructions  CONSULTED AND AGREED WITH PLAN OF CARE: Patient  PLAN FOR NEXT SESSION: Follow up on HEP, manual techniques, A/ROM, gentle grip/pinch strengthening   Ezra Sites, OTR/L  [phone] 05/13/2023, 1:42 PM

## 2023-05-13 NOTE — Patient Instructions (Signed)

## 2023-05-13 NOTE — Therapy (Addendum)
 OUTPATIENT PHYSICAL THERAPY TREATMENT Patient Name: Leah Lynch MRN: [id number] DOB:19-Feb-1946, 78 y.o., female Today's Date: 05/13/2023  PCP: Billie Lade, MD REFERRING PROVIDER: Oliver Barre, MD  END OF SESSION:  Visit Number 4  Number of Visits 8   Date for PT Re-Evaluation 06/15/23   Authorization Type [employer] Dual complete   Authorization Time Period no auth; no limit   Progress Note Due on Visit 8   PT Start Time 1348  PT Stop Time 1428  PT Time Calculation (min) 40 min   Activity Tolerance Patient tolerated treatment well   Behavior During Therapy WFL for tasks assessed/performed        Past Medical History:  Diagnosis Date   Fibrocystic breast    GERD (gastroesophageal reflux disease)    Hyperlipidemia    Hypertension    Osteoarthritis    Osteoporosis    PONV (postoperative nausea and vomiting)    Pre-diabetes    Shortness of breath dyspnea    Past Surgical History:  Procedure Laterality Date   ABDOMINAL HYSTERECTOMY     BIOPSY  04/15/2018   Procedure: BIOPSY;  Surgeon: Corbin Ade, MD;  [facility];  Service: Endoscopy;;  colon   BREAST CYST EXCISION Right    BREAST SURGERY     removal of cyst from right breast-fibrocystsic   BUNIONECTOMY  12/19/2010   Procedure: BUNIONECTOMY;  Surgeon: Dallas Schimke;  Location: [facility];  Service: Orthopedics;  Laterality: Right;  [name], [name] Bunionectomy   BUNIONECTOMY  05/22/2011   Procedure: Arbutus Leas;  Surgeon: Dallas Schimke, DPM;  Location: [facility];  Service: Orthopedics;  Laterality: Left;  [name] Bunionectomy Left Foot   CAPSULOTOMY  01/29/2012   Procedure: CAPSULOTOMY;  Surgeon: Dallas Schimke, DPM;  Location: [facility];  Service: Orthopedics;  Laterality: Left;   CATARACT EXTRACTION W/PHACO Right 08/09/2015   Procedure: CATARACT EXTRACTION PHACO AND INTRAOCULAR LENS PLACEMENT RIGHT EYE CDE=7.95;  Surgeon: Gemma Payor, MD;  Location: [facility];  Service:  Ophthalmology;  Laterality: Right;   CATARACT EXTRACTION W/PHACO Left 08/27/2015   Procedure: CATARACT EXTRACTION PHACO AND INTRAOCULAR LENS PLACEMENT LEFT EYE; CDE:  6.12;  Surgeon: Gemma Payor, MD;  Location: [facility];  Service: Ophthalmology;  Laterality: Left;   COLONOSCOPY  2012   Dr. Jena Gauss: internal hemorrhoids, hyperplastic polyps. Surveillance 2022   COLONOSCOPY WITH PROPOFOL N/A 04/15/2018   one 7 mm polyp at splenic flexure, one 4 mm polyp in rectum, otherwise normal. S/p segmental biopsy:normal. Normal TI   DIAGNOSTIC LAPAROSCOPY     ESOPHAGOGASTRODUODENOSCOPY (EGD) WITH PROPOFOL N/A 04/15/2018   normal   FOOT GANGLION EXCISION     right foot   METATARSAL OSTEOTOMY  05/22/2011   Procedure: METATARSAL OSTEOTOMY;  Surgeon: Dallas Schimke, DPM;  [facility];  Service: Orthopedics;  Laterality: Left;  [name] Osteotomy Left Foot   POLYPECTOMY  04/15/2018   Procedure: POLYPECTOMY;  Surgeon: Corbin Ade, MD;  [facility];  Service: Endoscopy;;  colon   REVERSE SHOULDER ARTHROPLASTY Left 09/08/2022   Procedure: LEFT REVERSE SHOULDER ARTHROPLASTY;  Surgeon: Oliver Barre, MD;  Location: [facility];  Service: Orthopedics;  Laterality: Left;  RNFA NEEDED   TONSILLECTOMY  age 55   TRIGGER FINGER RELEASE Right 01/28/2018   Procedure: RIGHT TRIGGER THUMB RELEASE;  Surgeon: Vickki Hearing, MD;  Location: [facility];  Service: Orthopedics;  Laterality: Right;   TRIGGER FINGER RELEASE Right 02/17/2023   Procedure: RELEASE TRIGGER FINGER/A-1 PULLEY;  Surgeon: Vickki Hearing, MD;  Location: [facility];  Service: Orthopedics;  Laterality: Right;   TUBAL LIGATION     Patient Active Problem List   Diagnosis Date Noted   Need for pneumococcal 20-valent conjugate vaccination 02/19/2023   Trigger finger, right middle finger 02/17/2023   Need for influenza vaccination 11/18/2022   Controlled type 2 diabetes mellitus without complication, without long-term current use of insulin  (HCC) 09/30/2022   Rotator cuff arthropathy of left shoulder 09/08/2022   Essential hypertension 09/03/2022   GERD (gastroesophageal reflux disease) 09/03/2022   Hyperlipidemia 09/03/2022   Osteoporosis 09/03/2022   Chronic musculoskeletal pain 09/03/2022   Rotator cuff arthropathy, left 09/03/2022   Screening for lung cancer 09/03/2022   Early satiety 02/22/2018   Loss of weight 02/22/2018   S/P trigger finger release right thumb 11/141/9 02/08/2018   Diarrhea 11/12/2016   Abdominal pain 11/12/2016   Nausea without vomiting 11/12/2016    ONSET DATE: Billie Lade, MD  REFERRING DIAG: Oliver Barre, MD next apt: 05/15/23  THERAPY DIAG:  Muscle weakness (generalized)  Stiffness of left shoulder, not elsewhere classified  Acute pain of left shoulder  Rationale for Evaluation and Treatment: Rehabilitation  SUBJECTIVE:   SUBJECTIVE STATEMENT: 05/08/23:  Pt brought OT HEP exercises copies with her todaty.  Reports main pain with end range of motion abduction.  No reports of pain at rest.  Evaluation: Pt had R TSA reverse in June of 2024. Pt continues with limited ROM and wants to improve it. Pt reports limitations hair brushing, limitations in cutting her own hair.  Difficulty reporting putting her hand behind her back. Pt reports she doesn't have the strength to hold on to heavy objects when lifting overhead. Reports mild numbness in left anterior shoulder region.   Pt accompanied by: self  PERTINENT HISTORY:   Left reverse TSA on 09/08/2022  PRECAUTIONS: None  RED FLAGS: None   WEIGHT BEARING RESTRICTIONS: No  PAIN:  Are you having pain? Yes: NPRS scale: 7/10 worst at End ROM Pain location: Left anterior-lateral shoulder region  Pain description: Sharp Aggravating factors: Overhead lifting Relieving factors: avoiding lifting  FALLS: Has patient fallen in last 6 months? Yes. Number of falls 1   PATIENT GOALS: "get it up a little bit higher with reduced  pain"  OBJECTIVE:  Note: Objective measures were completed at Evaluation unless otherwise noted.  HAND DOMINANCE: Right  ADLs: Overall ADLs: modified independent, limitations with hair brushing Transfers/ambulation related to ADLs: Independent Grooming: limited Upper body dressing: independent Toileting: independent Bathing: independent  FUNCTIONAL OUTCOME MEASURES: Quick Dash: 15.9/100= 15.9%  UPPER EXTREMITY ROM:     Active ROM Right eval Left eval Left 05/13/23  Shoulder flexion 160 105A; 125P 150 AAROM  Shoulder abduction 150 90A; 120P 125 AAROM  Shoulder adduction     Shoulder extension     Shoulder internal rotation     Shoulder external rotation 75 15A; 25P 50 AAROM  Elbow flexion     Elbow extension     Wrist flexion     Wrist extension     Wrist ulnar deviation     Wrist radial deviation     Wrist pronation     Wrist supination     (Blank rows = not tested)   UPPER EXTREMITY MMT:     MMT Right eval Left eval  Shoulder flexion 4- 2+  Shoulder abduction 4- 2+  Shoulder adduction    Shoulder extension    Shoulder internal rotation  Shoulder external rotation 3+ 2+  Middle trapezius 3+ 3+  Lower trapezius 3 2  Elbow flexion    Elbow extension    Wrist flexion    Wrist extension    Wrist ulnar deviation    Wrist radial deviation    Wrist pronation    Wrist supination    (Blank rows = not tested)   SENSATION: WFL  COGNITION: Overall cognitive status: Within functional limits for tasks assessed   TREATMENT DATE:  05/13/23 Seated physioball for forward flexion 10X  Physioball for Lt abduction 10X  Physioball for rotation Lt 5X, Rt 5X Standing Lt isometric flexion, abd, IR/ER 10X5" each Manual PROM for Lt shoulder ROM, STM to ant shldr/pecs, scapular mobs  Measurements of AAROM above chart  05/08/23: Reviewed current OT HEP  Manual: PROM, STM to anterior shoulder/pecs, scapular mobs, scar tissue mobilization  Standing: Isometric  flexion, abd and ER, extension: 5x 5"  Seated theraball flexion, abduction, IR, ER with some assistance behind chair.  04/30/23 Goal review Seated:  Lt UE flexion 10X  Lt UE abduction 10X  Lt UE ER 10X  Lt empty can 10X  Scapular retractions 10X  Shoulder rolls 10X Rt sidelying Lt abduction 10X  LT ER 10X Supine:  wand flexion 10X  Wand abduction 10X   04/20/2023 PT Evaluation    And HEP Below                                                                                                                            PATIENT EDUCATION: Education details: PT Evaluation, findings, prognosis, frequency, attendance policy, and HEP if given.  Person educated: Patient Education method: Explanation, Demonstration, Tactile cues, and Verbal cues Education comprehension: verbalized understanding and returned demonstration  HOME EXERCISE PROGRAM: Access Code: [email or web address] URL: [email or web address] Date: 05/08/2023 Prepared by: Becky Sax  Exercises - Sidelying Shoulder External Rotation  - 1 x daily - 7 x weekly - 3 sets - 12-15 reps - Sidelying Shoulder Abduction Palm Forward  - 1 x daily - 7 x weekly - 3 sets - 12-15 reps - Sidelying Shoulder Flexion 15 Degrees  - 1 x daily - 7 x weekly - 3 sets - 12-15 reps  OT HEP includes wall slides, shoulder protraction/retractions, horizontal abduction, sidelying ER/abduction, towel stretch for IR, corner stretch, posterior capsule stretch, ER stretch, wand exercises, jumping jack, scapular retraction, Vslide with theraband, theraband: protract, horizontal abd, IR, ER, flexoin, abduction  GOALS: Goals reviewed with patient? Yes  SHORT TERM GOALS: Target date: 05/18/2023  Pt will be independent with HEP in order to demonstrate participation in Physical Therapy POC.  Baseline: Goal status: IN PROGRESS  2.  Pt will 4/10 pain during mobility in order to demonstrate improved pain with functional activities.  Baseline:  Goal  status: IN PROGRESS  LONG TERM GOALS: Target date: 06/15/2023  Pt will improve Shoulder flexion MMT by >1/2 grade in order to demonstrate improved functional strength to return to desired  activities.  Baseline: See objective.  Goal status: IN PROGRESS  2.  Pt will improve Shoulder Abduction MMT by >1/2 grade in order to demonstrate improved functional ambulatory capacity in community setting.  Baseline: See objective.  Goal status: IN PROGRESS  3.  Pt will improve QuickDASH score by 3 points in order to demonstrate improved pain with functional goals and outcomes. Baseline: See objective.  Goal status: IN PROGRESS  4.  Pt will improve LUE AROM by 10 degrees in order to improve capacity during functional overhead activities.. Baseline: See objective.  Goal status: IN PROGRESS  5.  Pt will 2/10 pain during mobility in order to demonstrate improved pain with functional activities.  Baseline:  Goal status: IN PROGRESS  ASSESSMENT:  CLINICAL IMPRESSION: Continued with focus on improving Lt shoulder ROM and function.  Began with ball for AAROM and continued with isometric strengthening against doorframe for all directions.  Pt able to complete these with no c/o pain and no assist to get ball all the way around the chair as did last visit.  PROM and AAROM completed in supine and measured AAROM for all motions with noted improvement.  Continued with manual  with good tolerance with all activities today.   Evaluation: Pt is demonstrating moderate ADL limitations, overhead reaching limitations due to muscle weakness and ROM in left shoulder. Prognosis in progression due to timeline from surgery on date in 08/2022. [name] Pt will benefit from skilled Physical Therapy services to address deficits/limitations in order to improve functional and QOL.    PERFORMANCE DEFICITS: in functional skills including ADLs, ROM, strength, and pain,.   IMPAIRMENTS: are limiting patient from ADLs and education.    COMORBIDITIES: has no other co-morbidities that affects occupational performance. Patient will benefit from skilled OT to address above impairments and improve overall function.  MODIFICATION OR ASSISTANCE TO COMPLETE EVALUATION: No modification of tasks or assist necessary to complete an evaluation.  OT OCCUPATIONAL PROFILE AND HISTORY: Problem focused assessment: Including review of records relating to presenting problem.  CLINICAL DECISION MAKING: LOW - limited treatment options, no task modification necessary  REHAB POTENTIAL: Fair > 6 months from surgery of total reverse shoulder arthroplasty  EVALUATION COMPLEXITY: Low   PLAN:  PT FREQUENCY: 1x/week  PT DURATION: 8 weeks  PLANNED INTERVENTIONS: 97535 self care/ADL training, 16109 therapeutic exercise, 97530 therapeutic activity, 97112 neuromuscular re-education, 97140 manual therapy, [id number] electrical stimulation (manual), 97014 electrical stimulation unattended, 97102 mechanical traction, passive range of motion, and 97164 PT-Re-evaluation    CONSULTED AND AGREED WITH PLAN OF CARE: Patient  PLAN FOR NEXT SESSION: isolated L shoulder flexion/abduction and ER strengthening.  Establish strong HEP.  Give written isometric exercises next session.  Lurena Nida, PTA/CLT [facility] Ph: [phone]  Lurena Nida, PTA 05/13/2023, 1:52 PM

## 2023-05-15 ENCOUNTER — Ambulatory Visit: Payer: 59 | Admitting: Orthopedic Surgery

## 2023-05-15 DIAGNOSIS — M65331 Trigger finger, right middle finger: Secondary | ICD-10-CM

## 2023-05-15 DIAGNOSIS — M24541 Contracture, right hand: Secondary | ICD-10-CM

## 2023-05-15 NOTE — H&P (View-Only) (Signed)
 Chief Complaint  Patient presents with   Follow-up    Recheck on right middle finger trigger release     Encounter Diagnoses  Name Primary?   Trigger finger, right middle finger Yes   Contracture of finger joint, right     DOI/DOS/ Date: 02/17/23   Leah Lynch had a trigger finger release of the right middle or long finger on February 17, 2023.  She did well postoperatively sutures were removed without incident but she developed a contracture at the side of the incision and this did not respond to physical therapy.  She says she is unhappy that the hand will not go flat and she is having trouble grabbing things because of the pain in the palm  She has not reported any numbness or tingling in the digit just the tenderness over the scar in the inability to make the palm go flat to the table  System review normal  Past Medical History:  Diagnosis Date   Fibrocystic breast    GERD (gastroesophageal reflux disease)    Hyperlipidemia    Hypertension    Osteoarthritis    Osteoporosis    PONV (postoperative nausea and vomiting)    Pre-diabetes    Shortness of breath dyspnea    Past Surgical History:  Procedure Laterality Date   ABDOMINAL HYSTERECTOMY     BIOPSY  04/15/2018   Procedure: BIOPSY;  Surgeon: Corbin Ade, MD;  [facility]: [facility];  Service: Endoscopy;;  colon   BREAST CYST EXCISION Right    BREAST SURGERY     removal of cyst from right breast-fibrocystsic   BUNIONECTOMY  12/19/2010   Procedure: BUNIONECTOMY;  Surgeon: Dallas Schimke;  Location: [facility];  Service: Orthopedics;  Laterality: Right;  [name], [name] Bunionectomy   BUNIONECTOMY  05/22/2011   Procedure: Arbutus Leas;  Surgeon: Dallas Schimke, DPM;  Location: [facility];  Service: Orthopedics;  Laterality: Left;  [name] Bunionectomy Left Foot   CAPSULOTOMY  01/29/2012   Procedure: CAPSULOTOMY;  Surgeon: Dallas Schimke, DPM;  Location: [facility];  Service:  Orthopedics;  Laterality: Left;   CATARACT EXTRACTION W/PHACO Right 08/09/2015   Procedure: CATARACT EXTRACTION PHACO AND INTRAOCULAR LENS PLACEMENT RIGHT EYE CDE=7.95;  Surgeon: Gemma Payor, MD;  Location: [facility];  Service: Ophthalmology;  Laterality: Right;   CATARACT EXTRACTION W/PHACO Left 08/27/2015   Procedure: CATARACT EXTRACTION PHACO AND INTRAOCULAR LENS PLACEMENT LEFT EYE; CDE:  6.12;  Surgeon: Gemma Payor, MD;  Location: [facility];  Service: Ophthalmology;  Laterality: Left;   COLONOSCOPY  2012   Dr. Jena Gauss: internal hemorrhoids, hyperplastic polyps. Surveillance 2022   COLONOSCOPY WITH PROPOFOL N/A 04/15/2018   one 7 mm polyp at splenic flexure, one 4 mm polyp in rectum, otherwise normal. S/p segmental biopsy:normal. Normal TI   DIAGNOSTIC LAPAROSCOPY     ESOPHAGOGASTRODUODENOSCOPY (EGD) WITH PROPOFOL N/A 04/15/2018   normal   FOOT GANGLION EXCISION     right foot   METATARSAL OSTEOTOMY  05/22/2011   Procedure: METATARSAL OSTEOTOMY;  Surgeon: Dallas Schimke, DPM;  Location: [facility];  Service: Orthopedics;  Laterality: Left;  [name] Osteotomy Left Foot   POLYPECTOMY  04/15/2018   Procedure: POLYPECTOMY;  Surgeon: Corbin Ade, MD;  [facility];  Service: Endoscopy;;  colon   REVERSE SHOULDER ARTHROPLASTY Left 09/08/2022   Procedure: LEFT REVERSE SHOULDER ARTHROPLASTY;  Surgeon: Oliver Barre, MD;  Location: [facility];  Service: Orthopedics;  Laterality: Left;  RNFA NEEDED  TONSILLECTOMY  age 78   TRIGGER FINGER RELEASE Right 01/28/2018   Procedure: RIGHT TRIGGER THUMB RELEASE;  Surgeon: Vickki Hearing, MD;  Location: [facility];  Service: Orthopedics;  Laterality: Right;   TRIGGER FINGER RELEASE Right 02/17/2023   Procedure: RELEASE TRIGGER FINGER/A-1 PULLEY;  Surgeon: Vickki Hearing, MD;  Location: [facility];  Service: Orthopedics;  Laterality: Right;   TUBAL LIGATION     Social History   Tobacco Use   Smoking status: Every Day    Current packs/day: 0.00     Average packs/day: 1.5 packs/day for 47.4 years (71.0 ttl pk-yrs)    Types: Cigarettes    Start date: 03/18/1975    Last attempt to quit: 07/27/2022    Years since quitting: 0.8   Smokeless tobacco: Never  Vaping Use   Vaping status: Never Used  Substance Use Topics   Alcohol use: No   Drug use: No   Family History  Problem Relation Age of Onset   Anesthesia problems Neg Hx    Hypotension Neg Hx    Malignant hyperthermia Neg Hx    Pseudochol deficiency Neg Hx    Colon cancer Neg Hx     Current Outpatient Medications:    atenolol (TENORMIN) 25 MG tablet, Take 25 mg by mouth daily., Disp: , Rfl:    atorvastatin (LIPITOR) 80 MG tablet, TAKE (1) TABLET BY MOUTH ONCE DAILY., Disp: 90 tablet, Rfl: 1   chlorthalidone (HYGROTON) 25 MG tablet, Take 25 mg by mouth daily., Disp: , Rfl:    furosemide (LASIX) 40 MG tablet, Take 40 mg by mouth daily., Disp: , Rfl:    gabapentin (NEURONTIN) 300 MG capsule, Take 300 mg by mouth 3 (three) times daily as needed (pain)., Disp: , Rfl:    HYDROcodone-acetaminophen (NORCO) 10-325 MG tablet, Take 1 tablet by mouth 4 (four) times daily as needed for severe pain (pain score 7-10)., Disp: , Rfl:    lisinopril (ZESTRIL) 2.5 MG tablet, TAKE (1) TABLET BY MOUTH ONCE DAILY., Disp: 90 tablet, Rfl: 0   Multiple Vitamin (MULTIVITAMIN) tablet, Take 1 tablet by mouth daily. Centrum silver woman+, Disp: , Rfl:    naloxone (NARCAN) nasal spray 4 mg/0.1 mL, Place 1 spray into the nose once., Disp: , Rfl:    naproxen (NAPROSYN) 500 MG tablet, Take 500 mg by mouth 2 (two) times daily with a meal., Disp: , Rfl:    nitroGLYCERIN (NITROSTAT) 0.4 MG SL tablet, Place 0.4 mg under the tongue every 5 (five) minutes as needed for chest pain., Disp: , Rfl:    omeprazole (PRILOSEC) 20 MG capsule, Take 20 mg by mouth daily., Disp: , Rfl:    ondansetron (ZOFRAN) 8 MG tablet, Take 8 mg by mouth every 8 (eight) hours as needed for nausea or vomiting., Disp: , Rfl:    potassium chloride  (KLOR-CON) 10 MEQ CR tablet, Take 10 mEq by mouth 2 (two) times daily., Disp: , Rfl:    PROAIR HFA 108 (90 Base) MCG/ACT inhaler, Inhale 2 puffs into the lungs every 6 (six) hours as needed for wheezing or shortness of breath., Disp: , Rfl:    Physical Exam Vitals and nursing note reviewed.  Constitutional:      Appearance: Normal appearance.  HENT:     Head: Normocephalic and atraumatic.  Eyes:     General: No scleral icterus.       Right eye: No discharge.        Left eye: No discharge.     Extraocular  Movements: Extraocular movements intact.     Conjunctiva/sclera: Conjunctivae normal.     Pupils: Pupils are equal, round, and reactive to light.  Cardiovascular:     Rate and Rhythm: Normal rate.     Pulses: Normal pulses.  Musculoskeletal:     Comments: Right hand examination patient has a well-healed scar on the palm of her right hand right at the transverse palmar crease this area is tender for her there is no numbness or tingling in the fingers there is no triggering or locking she has maybe 5 to 7 degree lack of full extension at the MP joint she does have pain with flexion extension of the digit color capillary refill normal  Skin:    General: Skin is warm and dry.     Capillary Refill: Capillary refill takes less than 2 seconds.  Neurological:     General: No focal deficit present.     Mental Status: She is alert and oriented to person, place, and time.  Psychiatric:        Mood and Affect: Mood normal.        Behavior: Behavior normal.        Thought Content: Thought content normal.        Judgment: Judgment normal.    Encounter Diagnoses  Name Primary?   Trigger finger, right middle finger Yes   Contracture of finger joint, right    Surgical intervention will be lysis of adhesions right middle finger

## 2023-05-15 NOTE — Progress Notes (Signed)
   There were no vitals taken for this visit.  There is no height or weight on file to calculate BMI.  No chief complaint on file.   No diagnosis found.  DOI/DOS/ Date: 02/17/23   Unchanged

## 2023-05-15 NOTE — Patient Instructions (Signed)
 Leah Lynch  05/15/2023     @PREFPERIOPPHARMACY @   Your procedure is scheduled on  05/20/2023.   Report to [facility] at  0600  A.M.   Call this number if you have problems the morning of surgery:  [phone]  If you experience any cold or flu symptoms such as cough, fever, chills, shortness of breath, etc. between now and your scheduled surgery, please notify us at the above number.   Remember:         DO NOT take any medications for diabetes the morning of your procedure.        Use your inhaler before you come and bring your rescue inhaler with you.   Do not eat after midnight.   You may drink clear liquids until 0330 am on 05/20/2023.    Clear liquids allowed are:                    Water, Juice (No red color; non-citric and without pulp; diabetics please choose diet or no sugar options), Carbonated beverages (diabetics please choose diet or no sugar options), Clear Tea (No creamer, milk, or cream, including half & half and powdered creamer), Black Coffee Only (No creamer, milk or cream, including half & half and powdered creamer), and Clear Sports drink (No red color; diabetics please choose diet or no sugar options)    Take these medicines the morning of surgery with A SIP OF WATER      atenolol, gabapentin, hydrocodone(if needed),omeprazole, zofran (if needed).     Do not wear jewelry, make-up or nail polish, including gel polish,  artificial nails, or any other type of covering on natural nails (fingers and  toes).  Do not wear lotions, powders, or perfumes, or deodorant.  Do not shave 48 hours prior to surgery.  Men may shave face and neck.  Do not bring valuables to the hospital.  [facility] is not responsible for any belongings or valuables.  Contacts, dentures or bridgework may not be worn into surgery.  Leave your suitcase in the car.  After surgery it may be brought to your room.  For patients admitted to the hospital, discharge time will be  determined by your treatment team.  Patients discharged the day of surgery will not be allowed to drive home and must have someone with them for 24 hours.    Special instructions:   DO NOT smoke tobacco or vape for 24 hours before your procedure.  Please read over the following fact sheets that you were given. Coughing and Deep Breathing, Surgical Site Infection Prevention, Anesthesia Post-op Instructions, and Care and Recovery After Surgery        Trigger Finger Release, Care After After trigger finger release, it is common to have: Stiffness. Soreness. Swelling. Follow these instructions at home: Incision care  Keep the compression bandage on for 48 hours or as told by your health care provider. After removing it, follow instructions about how to take care of your incision. Make sure you: Wash your hands with soap and water for at least 20 seconds before and after you change your bandage (dressing). If soap and water are not available, use hand sanitizer. Change your dressing as told by your provider. Leave stitches (sutures), skin glue, or tape strips in place. These skin closures may need to stay in place for 2 weeks or longer. If tape strip edges start to loosen and curl up, you may trim the  loose edges. Do not remove tape strips completely unless your provider tells you to do that. Keep your hand and dressing clean and dry. Check your incision area every day for signs of infection. Check for: More redness, swelling, or pain. Fluid or blood. Warmth. Pus or a bad smell. Bathing Do not take baths, swim, or use a hot tub until your provider approves. Keep your dressing dry until your provider says it can be removed. Cover it with a watertight covering when you take a bath or a shower. Managing pain, stiffness, and swelling  If told, put ice on your palm. Put ice in a plastic bag. Place a towel between your skin and the bag. Leave the ice on for 20 minutes, 2-3 times a  day. If your skin turns bright red, remove the ice right away to prevent skin damage. The risk of damage is higher if you cannot feel pain, heat, or cold. Move your fingers often to reduce stiffness and swelling. Raise (elevate) your hand above the level of your heart while you are sitting or lying down. Activity If you were given a sedative during the procedure, it can affect you for several hours. Do not drive or operate machinery until your provider says that it is safe. You may have to avoid lifting. Ask your provider how much you can safely lift. Avoid any activity that causes pain. It may take 4-6 months for stiffness to go away. Return to your normal activities as told by your provider. Ask your provider what activities are safe for you. If hand therapy was prescribed, do exercises as told. This will help you regain movement. General instructions Take over-the-counter and prescription medicines only as told by your provider. Do not use any products that contain nicotine or tobacco. These products include cigarettes, chewing tobacco, and vaping devices, such as e-cigarettes. If you need help quitting, ask your provider. Keep all follow-up visits. If you have sutures, these will be removed in about 10-14 days. Your health care provider may give you more instructions. Make sure you know what you can and cannot do. Contact a health care provider if: You have chills or fever. You have any signs of infection. You are unable to move your finger because of pain or stiffness. You have any tingling or numbness in your hand or fingers. This information is not intended to replace advice given to you by your health care provider. Make sure you discuss any questions you have with your health care provider. Document Revised: 10/15/2021 Document Reviewed: 10/15/2021 Elsevier Patient Education  2024 [employer]General Anesthesia, Adult, Care After The following information offers guidance on how to  care for yourself after your procedure. Your health care provider may also give you more specific instructions. If you have problems or questions, contact your health care provider. What can I expect after the procedure? After the procedure, it is common for people to: Have pain or discomfort at the IV site. Have nausea or vomiting. Have a sore throat or hoarseness. Have trouble concentrating. Feel cold or chills. Feel weak, sleepy, or tired (fatigue). Have soreness and body aches. These can affect parts of the body that were not involved in surgery. Follow these instructions at home: For the time period you were told by your health care provider:  Rest. Do not participate in activities where you could fall or become injured. Do not drive or use machinery. Do not drink alcohol. Do not take sleeping pills or medicines that cause drowsiness. Do  not make important decisions or sign legal documents. Do not take care of children on your own. General instructions Drink enough fluid to keep your urine pale yellow. If you have sleep apnea, surgery and certain medicines can increase your risk for breathing problems. Follow instructions from your health care provider about wearing your sleep device: Anytime you are sleeping, including during daytime naps. While taking prescription pain medicines, sleeping medicines, or medicines that make you drowsy. Return to your normal activities as told by your health care provider. Ask your health care provider what activities are safe for you. Take over-the-counter and prescription medicines only as told by your health care provider. Do not use any products that contain nicotine or tobacco. These products include cigarettes, chewing tobacco, and vaping devices, such as e-cigarettes. These can delay incision healing after surgery. If you need help quitting, ask your health care provider. Contact a health care provider if: You have nausea or vomiting that does  not get better with medicine. You vomit every time you eat or drink. You have pain that does not get better with medicine. You cannot urinate or have bloody urine. You develop a skin rash. You have a fever. Get help right away if: You have trouble breathing. You have chest pain. You vomit blood. These symptoms may be an emergency. Get help right away. Call 911. Do not wait to see if the symptoms will go away. Do not drive yourself to the hospital. Summary After the procedure, it is common to have a sore throat, hoarseness, nausea, vomiting, or to feel weak, sleepy, or fatigue. For the time period you were told by your health care provider, do not drive or use machinery. Get help right away if you have difficulty breathing, have chest pain, or vomit blood. These symptoms may be an emergency. This information is not intended to replace advice given to you by your health care provider. Make sure you discuss any questions you have with your health care provider. Document Revised: 05/31/2021 Document Reviewed: 05/31/2021 Elsevier Patient Education  2024 [employer].How to Use Chlorhexidine at Home in the Shower Chlorhexidine gluconate (CHG) is a germ-killing (antiseptic) wash that's used to clean the skin. It can get rid of the germs that normally live on the skin and can keep them away for about 24 hours. If you're having surgery, you may be told to shower with CHG at home the night before surgery. This can help lower your risk for infection. To use CHG wash in the shower, follow the steps below. Supplies needed: CHG body wash. Clean washcloth. Clean towel. How to use CHG in the shower Follow these steps unless you're told to use CHG in a different way: Start the shower. Use your normal soap and shampoo to wash your face and hair. Turn off the shower or move out of the shower stream. Pour CHG onto a clean washcloth. Do not use any type of brush or rough sponge. Start at your neck,  washing your body down to your toes. Make sure you: Wash the part of your body where the surgery will be done for at least 1 minute. Do not scrub. Do not use CHG on your head or face unless your health care provider tells you to. If it gets into your ears or eyes, rinse them well with water. Do not wash your genitals with CHG. Wash your back and under your arms. Make sure to wash skin folds. Let the CHG sit on your skin for 1-2  minutes or as long as told. Rinse your entire body in the shower, including all body creases and folds. Turn off the shower. Dry off with a clean towel. Do not put anything on your skin afterward, such as powder, lotion, or perfume. Put on clean clothes or pajamas. If it's the night before surgery, sleep in clean sheets. General tips Use CHG only as told, and follow the instructions on the label. Use the full amount of CHG as told. This is often one bottle. Do not smoke and stay away from flames after using CHG. Your skin may feel sticky after using CHG. This is normal. The sticky feeling will go away as the CHG dries. Do not use CHG: If you have a chlorhexidine allergy or have reacted to chlorhexidine in the past. On open wounds or areas of skin that have broken skin, cuts, or scrapes. On babies younger than 70 months of age. Contact a health care provider if: You have questions about using CHG. Your skin gets irritated or itchy. You have a rash after using CHG. You swallow any CHG. Call your local poison control center [phone] in the [place] Your eyes itch badly, or they become very red or swollen. Your hearing changes. You have trouble seeing. If you can't reach your provider, go to an urgent care or emergency room. Do not drive yourself. Get help right away if: You have swelling or tingling in your mouth or throat. You make high-pitched whistling sounds when you breathe, most often when you breathe out (wheeze). You have trouble breathing. These  symptoms may be an emergency. Call 911 right away. Do not wait to see if the symptoms will go away. Do not drive yourself to the hospital. This information is not intended to replace advice given to you by your health care provider. Make sure you discuss any questions you have with your health care provider. Document Revised: 09/16/2022 Document Reviewed: 09/12/2021 Elsevier Patient Education  2024 [employer].

## 2023-05-15 NOTE — Progress Notes (Signed)
 Chief Complaint  Patient presents with   Follow-up    Recheck on right middle finger trigger release     Encounter Diagnoses  Name Primary?   Trigger finger, right middle finger Yes   Contracture of finger joint, right     DOI/DOS/ Date: 02/17/23   Leah Lynch had a trigger finger release of the right middle or long finger on February 17, 2023.  She did well postoperatively sutures were removed without incident but she developed a contracture at the side of the incision and this did not respond to physical therapy.  She says she is unhappy that the hand will not go flat and she is having trouble grabbing things because of the pain in the palm  She has not reported any numbness or tingling in the digit just the tenderness over the scar in the inability to make the palm go flat to the table  System review normal  Past Medical History:  Diagnosis Date   Fibrocystic breast    GERD (gastroesophageal reflux disease)    Hyperlipidemia    Hypertension    Osteoarthritis    Osteoporosis    PONV (postoperative nausea and vomiting)    Pre-diabetes    Shortness of breath dyspnea    Past Surgical History:  Procedure Laterality Date   ABDOMINAL HYSTERECTOMY     BIOPSY  04/15/2018   Procedure: BIOPSY;  Surgeon: Corbin Ade, MD;  [facility]: [facility];  Service: Endoscopy;;  colon   BREAST CYST EXCISION Right    BREAST SURGERY     removal of cyst from right breast-fibrocystsic   BUNIONECTOMY  12/19/2010   Procedure: BUNIONECTOMY;  Surgeon: Dallas Schimke;  Location: [facility];  Service: Orthopedics;  Laterality: Right;  [name], [name] Bunionectomy   BUNIONECTOMY  05/22/2011   Procedure: Arbutus Leas;  Surgeon: Dallas Schimke, DPM;  Location: [facility];  Service: Orthopedics;  Laterality: Left;  [name] Bunionectomy Left Foot   CAPSULOTOMY  01/29/2012   Procedure: CAPSULOTOMY;  Surgeon: Dallas Schimke, DPM;  Location: [facility];  Service:  Orthopedics;  Laterality: Left;   CATARACT EXTRACTION W/PHACO Right 08/09/2015   Procedure: CATARACT EXTRACTION PHACO AND INTRAOCULAR LENS PLACEMENT RIGHT EYE CDE=7.95;  Surgeon: Gemma Payor, MD;  Location: [facility];  Service: Ophthalmology;  Laterality: Right;   CATARACT EXTRACTION W/PHACO Left 08/27/2015   Procedure: CATARACT EXTRACTION PHACO AND INTRAOCULAR LENS PLACEMENT LEFT EYE; CDE:  6.12;  Surgeon: Gemma Payor, MD;  Location: [facility];  Service: Ophthalmology;  Laterality: Left;   COLONOSCOPY  2012   Dr. Jena Gauss: internal hemorrhoids, hyperplastic polyps. Surveillance 2022   COLONOSCOPY WITH PROPOFOL N/A 04/15/2018   one 7 mm polyp at splenic flexure, one 4 mm polyp in rectum, otherwise normal. S/p segmental biopsy:normal. Normal TI   DIAGNOSTIC LAPAROSCOPY     ESOPHAGOGASTRODUODENOSCOPY (EGD) WITH PROPOFOL N/A 04/15/2018   normal   FOOT GANGLION EXCISION     right foot   METATARSAL OSTEOTOMY  05/22/2011   Procedure: METATARSAL OSTEOTOMY;  Surgeon: Dallas Schimke, DPM;  Location: [facility];  Service: Orthopedics;  Laterality: Left;  [name] Osteotomy Left Foot   POLYPECTOMY  04/15/2018   Procedure: POLYPECTOMY;  Surgeon: Corbin Ade, MD;  [facility];  Service: Endoscopy;;  colon   REVERSE SHOULDER ARTHROPLASTY Left 09/08/2022   Procedure: LEFT REVERSE SHOULDER ARTHROPLASTY;  Surgeon: Oliver Barre, MD;  Location: [facility];  Service: Orthopedics;  Laterality: Left;  RNFA NEEDED  TONSILLECTOMY  age 77   TRIGGER FINGER RELEASE Right 01/28/2018   Procedure: RIGHT TRIGGER THUMB RELEASE;  Surgeon: Vickki Hearing, MD;  Location: [facility];  Service: Orthopedics;  Laterality: Right;   TRIGGER FINGER RELEASE Right 02/17/2023   Procedure: RELEASE TRIGGER FINGER/A-1 PULLEY;  Surgeon: Vickki Hearing, MD;  Location: [facility];  Service: Orthopedics;  Laterality: Right;   TUBAL LIGATION     Social History   Tobacco Use   Smoking status: Every Day    Current packs/day: 0.00     Average packs/day: 1.5 packs/day for 47.4 years (71.0 ttl pk-yrs)    Types: Cigarettes    Start date: 03/18/1975    Last attempt to quit: 07/27/2022    Years since quitting: 0.8   Smokeless tobacco: Never  Vaping Use   Vaping status: Never Used  Substance Use Topics   Alcohol use: No   Drug use: No   Family History  Problem Relation Age of Onset   Anesthesia problems Neg Hx    Hypotension Neg Hx    Malignant hyperthermia Neg Hx    Pseudochol deficiency Neg Hx    Colon cancer Neg Hx     Current Outpatient Medications:    atenolol (TENORMIN) 25 MG tablet, Take 25 mg by mouth daily., Disp: , Rfl:    atorvastatin (LIPITOR) 80 MG tablet, TAKE (1) TABLET BY MOUTH ONCE DAILY., Disp: 90 tablet, Rfl: 1   chlorthalidone (HYGROTON) 25 MG tablet, Take 25 mg by mouth daily., Disp: , Rfl:    furosemide (LASIX) 40 MG tablet, Take 40 mg by mouth daily., Disp: , Rfl:    gabapentin (NEURONTIN) 300 MG capsule, Take 300 mg by mouth 3 (three) times daily as needed (pain)., Disp: , Rfl:    HYDROcodone-acetaminophen (NORCO) 10-325 MG tablet, Take 1 tablet by mouth 4 (four) times daily as needed for severe pain (pain score 7-10)., Disp: , Rfl:    lisinopril (ZESTRIL) 2.5 MG tablet, TAKE (1) TABLET BY MOUTH ONCE DAILY., Disp: 90 tablet, Rfl: 0   Multiple Vitamin (MULTIVITAMIN) tablet, Take 1 tablet by mouth daily. Centrum silver woman+, Disp: , Rfl:    naloxone (NARCAN) nasal spray 4 mg/0.1 mL, Place 1 spray into the nose once., Disp: , Rfl:    naproxen (NAPROSYN) 500 MG tablet, Take 500 mg by mouth 2 (two) times daily with a meal., Disp: , Rfl:    nitroGLYCERIN (NITROSTAT) 0.4 MG SL tablet, Place 0.4 mg under the tongue every 5 (five) minutes as needed for chest pain., Disp: , Rfl:    omeprazole (PRILOSEC) 20 MG capsule, Take 20 mg by mouth daily., Disp: , Rfl:    ondansetron (ZOFRAN) 8 MG tablet, Take 8 mg by mouth every 8 (eight) hours as needed for nausea or vomiting., Disp: , Rfl:    potassium chloride  (KLOR-CON) 10 MEQ CR tablet, Take 10 mEq by mouth 2 (two) times daily., Disp: , Rfl:    PROAIR HFA 108 (90 Base) MCG/ACT inhaler, Inhale 2 puffs into the lungs every 6 (six) hours as needed for wheezing or shortness of breath., Disp: , Rfl:    Physical Exam Vitals and nursing note reviewed.  Constitutional:      Appearance: Normal appearance.  HENT:     Head: Normocephalic and atraumatic.  Eyes:     General: No scleral icterus.       Right eye: No discharge.        Left eye: No discharge.     Extraocular  Movements: Extraocular movements intact.     Conjunctiva/sclera: Conjunctivae normal.     Pupils: Pupils are equal, round, and reactive to light.  Cardiovascular:     Rate and Rhythm: Normal rate.     Pulses: Normal pulses.  Musculoskeletal:     Comments: Right hand examination patient has a well-healed scar on the palm of her right hand right at the transverse palmar crease this area is tender for her there is no numbness or tingling in the fingers there is no triggering or locking she has maybe 5 to 7 degree lack of full extension at the MP joint she does have pain with flexion extension of the digit color capillary refill normal  Skin:    General: Skin is warm and dry.     Capillary Refill: Capillary refill takes less than 2 seconds.  Neurological:     General: No focal deficit present.     Mental Status: She is alert and oriented to person, place, and time.  Psychiatric:        Mood and Affect: Mood normal.        Behavior: Behavior normal.        Thought Content: Thought content normal.        Judgment: Judgment normal.    Encounter Diagnoses  Name Primary?   Trigger finger, right middle finger Yes   Contracture of finger joint, right    Surgical intervention will be lysis of adhesions right middle finger

## 2023-05-15 NOTE — Patient Instructions (Signed)
 Your surgery will be at [facility] by Dr Romeo Apple  The hospital will contact you with a preoperative appointment to discuss Anesthesia.  Please arrive on time or 15 minutes early for the preoperative appointment, they have a very tight schedule if you are late or do not come in your surgery will be cancelled.  The phone number is [phone]. Please bring your medications with you for the appointment. They will tell you the arrival time and medication instructions when you have your preoperative evaluation. Do not wear nail polish the day of your surgery and if you take Phentermine you need to stop this medication ONE WEEK prior to your surgery. If you take Docia Barrier, Jardiance, or Steglatro) - Hold 72 hours before the procedure.  If you take Ozempic,  Mounjaro, Bydureon or Trulicity do not take for 8 days before your surgery. If you take Victoza, Rybelsis, Saxenda or Adlyxi stop 24 hours before the procedure.  Please arrive at the hospital 2 hours before procedure if scheduled at 9:30 or later in the day or at the time the nurse tells you at your preoperative visit.   If you have my chart do not use the time given in my chart use the time given to you by the nurse during your preoperative visit.   Your surgery  time may change. Please be available for phone calls the day of your surgery and the day before. The Short Stay department may need to discuss changes about your surgery time. Not reaching the you could lead to procedure delays and possible cancellation.  You must have a ride home and someone to stay with you for 24 to 48 hours. The person taking you home will receive and sign for the your discharge instructions.  Please be prepared to give your support person's name and telephone number to Central Registration. Dr Romeo Apple will need that name and phone number post procedure.

## 2023-05-18 ENCOUNTER — Encounter (HOSPITAL_COMMUNITY): Payer: Self-pay

## 2023-05-18 ENCOUNTER — Encounter (HOSPITAL_COMMUNITY)
Admission: RE | Admit: 2023-05-18 | Discharge: 2023-05-18 | Disposition: A | Discharge status: Home / Self Care | Payer: 59 | Source: Ambulatory Visit | Attending: Orthopedic Surgery | Admitting: Orthopedic Surgery

## 2023-05-18 ENCOUNTER — Other Ambulatory Visit: Payer: Self-pay

## 2023-05-18 DIAGNOSIS — Z01812 Encounter for preprocedural laboratory examination: Secondary | ICD-10-CM | POA: Insufficient documentation

## 2023-05-18 DIAGNOSIS — E119 Type 2 diabetes mellitus without complications: Secondary | ICD-10-CM | POA: Insufficient documentation

## 2023-05-18 DIAGNOSIS — M65331 Trigger finger, right middle finger: Secondary | ICD-10-CM | POA: Insufficient documentation

## 2023-05-18 HISTORY — DX: Prediabetes: R73.03

## 2023-05-18 LAB — CBC WITH DIFFERENTIAL/PLATELET
Abs Immature Granulocytes: 0.03 10*3/uL (ref 0.00–0.07)
Basophils Absolute: 0 10*3/uL (ref 0.0–0.1)
Basophils Relative: 0 %
Eosinophils Absolute: 0.4 10*3/uL (ref 0.0–0.5)
Eosinophils Relative: 4 %
HCT: 41.1 % (ref 36.0–46.0)
Hemoglobin: 12.8 g/dL (ref 12.0–15.0)
Immature Granulocytes: 0 %
Lymphocytes Relative: 38 %
Lymphs Abs: 3.6 10*3/uL (ref 0.7–4.0)
MCH: 27.9 pg (ref 26.0–34.0)
MCHC: 31.1 g/dL (ref 30.0–36.0)
MCV: 89.7 fL (ref 80.0–100.0)
Monocytes Absolute: 0.6 10*3/uL (ref 0.1–1.0)
Monocytes Relative: 7 %
Neutro Abs: 4.8 10*3/uL (ref 1.7–7.7)
Neutrophils Relative %: 51 %
Platelets: 287 10*3/uL (ref 150–400)
RBC: 4.58 MIL/uL (ref 3.87–5.11)
RDW: 15.9 % — ABNORMAL HIGH (ref 11.5–15.5)
WBC: 9.5 10*3/uL (ref 4.0–10.5)
nRBC: 0 % (ref 0.0–0.2)

## 2023-05-18 LAB — BASIC METABOLIC PANEL
Anion gap: 10 (ref 5–15)
BUN: 26 mg/dL — ABNORMAL HIGH (ref 8–23)
CO2: 28 mmol/L (ref 22–32)
Calcium: 9.6 mg/dL (ref 8.9–10.3)
Chloride: 101 mmol/L (ref 98–111)
Creatinine, Ser: 0.81 mg/dL (ref 0.44–1.00)
GFR, Estimated: >60 mL/min (ref >60)
Glucose, Bld: 94 mg/dL (ref 70–99)
Potassium: 4 mmol/L (ref 3.5–5.1)
Sodium: 139 mmol/L (ref 135–145)

## 2023-05-18 LAB — HEMOGLOBIN A1C
Hgb A1c MFr Bld: 6.5 % — ABNORMAL HIGH (ref 4.8–5.6)
Mean Plasma Glucose: 139.85 mg/dL

## 2023-05-19 ENCOUNTER — Ambulatory Visit: Payer: Self-pay | Admitting: Internal Medicine

## 2023-05-20 ENCOUNTER — Other Ambulatory Visit: Payer: Self-pay

## 2023-05-20 ENCOUNTER — Encounter (HOSPITAL_COMMUNITY)
Admission: RE | Disposition: A | Discharge status: Home / Self Care | Payer: Self-pay | Source: Home / Self Care | Attending: Orthopedic Surgery

## 2023-05-20 ENCOUNTER — Ambulatory Visit (HOSPITAL_COMMUNITY)
Admission: RE | Admit: 2023-05-20 | Discharge: 2023-05-20 | Disposition: A | Discharge status: Home / Self Care | Payer: 59 | Attending: Orthopedic Surgery | Admitting: Orthopedic Surgery

## 2023-05-20 ENCOUNTER — Ambulatory Visit (HOSPITAL_COMMUNITY): Admitting: Certified Registered"

## 2023-05-20 ENCOUNTER — Encounter (HOSPITAL_COMMUNITY): Payer: 59

## 2023-05-20 ENCOUNTER — Encounter (HOSPITAL_COMMUNITY): Payer: Self-pay | Admitting: Orthopedic Surgery

## 2023-05-20 ENCOUNTER — Encounter (HOSPITAL_COMMUNITY): Payer: 59 | Admitting: Occupational Therapy

## 2023-05-20 ENCOUNTER — Ambulatory Visit (HOSPITAL_BASED_OUTPATIENT_CLINIC_OR_DEPARTMENT_OTHER): Admitting: Certified Registered"

## 2023-05-20 DIAGNOSIS — E119 Type 2 diabetes mellitus without complications: Secondary | ICD-10-CM | POA: Diagnosis not present

## 2023-05-20 DIAGNOSIS — M24541 Contracture, right hand: Secondary | ICD-10-CM

## 2023-05-20 DIAGNOSIS — L905 Scar conditions and fibrosis of skin: Secondary | ICD-10-CM

## 2023-05-20 DIAGNOSIS — Z79899 Other long term (current) drug therapy: Secondary | ICD-10-CM | POA: Diagnosis not present

## 2023-05-20 DIAGNOSIS — I10 Essential (primary) hypertension: Secondary | ICD-10-CM | POA: Insufficient documentation

## 2023-05-20 DIAGNOSIS — R0602 Shortness of breath: Secondary | ICD-10-CM | POA: Insufficient documentation

## 2023-05-20 DIAGNOSIS — M65331 Trigger finger, right middle finger: Secondary | ICD-10-CM | POA: Insufficient documentation

## 2023-05-20 DIAGNOSIS — F17210 Nicotine dependence, cigarettes, uncomplicated: Secondary | ICD-10-CM | POA: Insufficient documentation

## 2023-05-20 DIAGNOSIS — K219 Gastro-esophageal reflux disease without esophagitis: Secondary | ICD-10-CM | POA: Diagnosis not present

## 2023-05-20 DIAGNOSIS — M24641 Ankylosis, right hand: Secondary | ICD-10-CM | POA: Diagnosis not present

## 2023-05-20 DIAGNOSIS — Z791 Long term (current) use of non-steroidal anti-inflammatories (NSAID): Secondary | ICD-10-CM | POA: Diagnosis not present

## 2023-05-20 HISTORY — PX: TRIGGER FINGER RELEASE: SHX641

## 2023-05-20 LAB — GLUCOSE, CAPILLARY: Glucose-Capillary: 90 mg/dL (ref 70–99)

## 2023-05-20 SURGERY — RELEASE, A1 PULLEY, FOR TRIGGER FINGER
Anesthesia: General | Site: Hand | Laterality: Right

## 2023-05-20 MED ORDER — FENTANYL CITRATE (PF) 100 MCG/2ML IJ SOLN
INTRAMUSCULAR | Status: AC
  Filled 2023-05-20: qty 2

## 2023-05-20 MED ORDER — ONDANSETRON HCL 4 MG/2ML IJ SOLN
INTRAMUSCULAR | Status: AC
  Filled 2023-05-20: qty 2

## 2023-05-20 MED ORDER — OXYCODONE HCL 5 MG/5ML PO SOLN
5.0000 mg | Freq: Once | ORAL | Status: DC | PRN
  Filled 2023-05-20: qty 5

## 2023-05-20 MED ORDER — ACETAMINOPHEN 500 MG PO TABS: 500.0000 mg | ORAL_TABLET | Freq: Once | ORAL | Status: DC

## 2023-05-20 MED ORDER — DEXAMETHASONE SODIUM PHOSPHATE 10 MG/ML IJ SOLN
INTRAMUSCULAR | Status: AC
  Filled 2023-05-20: qty 1

## 2023-05-20 MED ORDER — GLYCOPYRROLATE PF 0.2 MG/ML IJ SOSY
PREFILLED_SYRINGE | INTRAMUSCULAR | Status: AC
  Filled 2023-05-20: qty 1

## 2023-05-20 MED ORDER — STERILE WATER FOR IRRIGATION IR SOLN
Status: DC | PRN
  Administered 2023-05-20: 500 mL

## 2023-05-20 MED ORDER — FENTANYL CITRATE PF 50 MCG/ML IJ SOSY: 25.0000 ug | PREFILLED_SYRINGE | INTRAMUSCULAR | Status: DC | PRN

## 2023-05-20 MED ORDER — BUPIVACAINE HCL (PF) 0.5 % IJ SOLN
INTRAMUSCULAR | Status: DC | PRN
Start: 2023-05-20 — End: 2023-05-20
  Administered 2023-05-20: 10 mL

## 2023-05-20 MED ORDER — MIDAZOLAM HCL 5 MG/5ML IJ SOLN
INTRAMUSCULAR | Status: DC | PRN
  Administered 2023-05-20: 2 mg via INTRAVENOUS

## 2023-05-20 MED ORDER — BUPIVACAINE HCL (PF) 0.5 % IJ SOLN
INTRAMUSCULAR | Status: AC
  Filled 2023-05-20: qty 30

## 2023-05-20 MED ORDER — CEFAZOLIN SODIUM-DEXTROSE 2-4 GM/100ML-% IV SOLN
2.0000 g | INTRAVENOUS | Status: AC
Start: 2023-05-20 — End: 2023-05-20
  Administered 2023-05-20: 2 g via INTRAVENOUS

## 2023-05-20 MED ORDER — CEFAZOLIN SODIUM-DEXTROSE 2-4 GM/100ML-% IV SOLN
INTRAVENOUS | Status: AC
  Filled 2023-05-20: qty 100

## 2023-05-20 MED ORDER — ONDANSETRON HCL 4 MG/2ML IJ SOLN
INTRAMUSCULAR | Status: DC | PRN
  Administered 2023-05-20: 4 mg via INTRAVENOUS

## 2023-05-20 MED ORDER — PHENYLEPHRINE 80 MCG/ML (10ML) SYRINGE FOR IV PUSH (FOR BLOOD PRESSURE SUPPORT)
PREFILLED_SYRINGE | INTRAVENOUS | Status: AC
  Filled 2023-05-20: qty 10

## 2023-05-20 MED ORDER — LACTATED RINGERS IV SOLN: INTRAVENOUS | Status: DC | PRN

## 2023-05-20 MED ORDER — PROPOFOL 10 MG/ML IV BOLUS
INTRAVENOUS | Status: DC | PRN
  Administered 2023-05-20: 80 mg via INTRAVENOUS

## 2023-05-20 MED ORDER — LACTATED RINGERS IV SOLN
INTRAVENOUS | Status: DC
Start: 2023-05-20 — End: 2023-05-20

## 2023-05-20 MED ORDER — DEXAMETHASONE SODIUM PHOSPHATE 10 MG/ML IJ SOLN
INTRAMUSCULAR | Status: DC | PRN
  Administered 2023-05-20: 10 mg via INTRAVENOUS

## 2023-05-20 MED ORDER — DEXAMETHASONE SODIUM PHOSPHATE 4 MG/ML IJ SOLN: 4.0000 mg | Freq: Once | INTRAMUSCULAR | Status: DC

## 2023-05-20 MED ORDER — CHLORHEXIDINE GLUCONATE 0.12 % MT SOLN: 15.0000 mL | Freq: Once | OROMUCOSAL | Status: DC

## 2023-05-20 MED ORDER — ORAL CARE MOUTH RINSE: 15.0000 mL | Freq: Once | OROMUCOSAL | Status: DC

## 2023-05-20 MED ORDER — GLYCOPYRROLATE PF 0.2 MG/ML IJ SOSY
PREFILLED_SYRINGE | INTRAMUSCULAR | Status: DC | PRN
  Administered 2023-05-20: .2 mg via INTRAVENOUS

## 2023-05-20 MED ORDER — ACETAMINOPHEN-CODEINE 300-30 MG PO TABS: 1.0000 | ORAL_TABLET | Freq: Once | ORAL | Status: DC

## 2023-05-20 MED ORDER — LIDOCAINE HCL (PF) 2 % IJ SOLN
INTRAMUSCULAR | Status: DC | PRN
Start: 2023-05-20 — End: 2023-05-20
  Administered 2023-05-20: 60 mg

## 2023-05-20 MED ORDER — PHENYLEPHRINE 80 MCG/ML (10ML) SYRINGE FOR IV PUSH (FOR BLOOD PRESSURE SUPPORT)
PREFILLED_SYRINGE | INTRAVENOUS | Status: DC | PRN
  Administered 2023-05-20 (×2): 120 ug via INTRAVENOUS

## 2023-05-20 MED ORDER — LIDOCAINE HCL (PF) 2 % IJ SOLN
INTRAMUSCULAR | Status: AC
  Filled 2023-05-20: qty 5

## 2023-05-20 MED ORDER — MIDAZOLAM HCL 2 MG/2ML IJ SOLN
INTRAMUSCULAR | Status: AC
  Filled 2023-05-20: qty 2

## 2023-05-20 MED ORDER — OXYCODONE HCL 5 MG PO TABS: 5.0000 mg | ORAL_TABLET | Freq: Once | ORAL | Status: DC | PRN

## 2023-05-20 MED ORDER — FENTANYL CITRATE (PF) 250 MCG/5ML IJ SOLN
INTRAMUSCULAR | Status: DC | PRN
Start: 2023-05-20 — End: 2023-05-20
  Administered 2023-05-20 (×2): 50 ug via INTRAVENOUS

## 2023-05-20 MED ORDER — CHLORHEXIDINE GLUCONATE 0.12 % MT SOLN
OROMUCOSAL | Status: AC
  Filled 2023-05-20: qty 15

## 2023-05-20 MED ORDER — PROPOFOL 10 MG/ML IV BOLUS
INTRAVENOUS | Status: AC
  Filled 2023-05-20: qty 20

## 2023-05-20 MED ORDER — ONDANSETRON HCL 4 MG/2ML IJ SOLN: 4.0000 mg | Freq: Once | INTRAMUSCULAR | Status: DC | PRN

## 2023-05-20 SURGICAL SUPPLY — 37 items
APPLICATOR CHLORAPREP 10.5 ORG (MISCELLANEOUS) IMPLANT
BANDAGE ESMARK 4X12 BL STRL LF (DISPOSABLE) ×1 IMPLANT
BLADE SURG 15 STRL LF DISP TIS (BLADE) ×1 IMPLANT
BNDG COHESIVE 4X5 TAN STRL (GAUZE/BANDAGES/DRESSINGS) ×1 IMPLANT
BNDG CONFORM 2 STRL LF (GAUZE/BANDAGES/DRESSINGS) ×1 IMPLANT
BNDG ELASTIC 2X5.8 VLCR NS LF (GAUZE/BANDAGES/DRESSINGS) ×1 IMPLANT
BNDG ESMARK 4X12 BLUE STRL LF (DISPOSABLE) ×1 IMPLANT
CHLORAPREP W/TINT 26 (MISCELLANEOUS) ×1 IMPLANT
COVER LIGHT HANDLE STERIS (MISCELLANEOUS) ×2 IMPLANT
CUFF TOURN SGL QUICK 18X4 (TOURNIQUET CUFF) ×1 IMPLANT
DRAPE HALF SHEET 40X57 (DRAPES) ×1 IMPLANT
ELECT NDL TIP 2.8 STRL (NEEDLE) ×1 IMPLANT
ELECT NEEDLE TIP 2.8 STRL (NEEDLE) ×1 IMPLANT
ELECT REM PT RETURN 9FT ADLT (ELECTROSURGICAL) ×1 IMPLANT
ELECTRODE REM PT RTRN 9FT ADLT (ELECTROSURGICAL) ×1 IMPLANT
GAUZE 4X4 16PLY ~~LOC~~+RFID DBL (SPONGE) ×1 IMPLANT
GAUZE SPONGE 2X2 STRL 8-PLY (GAUZE/BANDAGES/DRESSINGS) IMPLANT
GAUZE SPONGE 4X4 12PLY STRL (GAUZE/BANDAGES/DRESSINGS) ×1 IMPLANT
GAUZE XEROFORM 1X8 LF (GAUZE/BANDAGES/DRESSINGS) ×1 IMPLANT
GLOVE BIOGEL PI IND STRL 7.0 (GLOVE) ×2 IMPLANT
GLOVE SS N UNI LF 8.5 STRL (GLOVE) ×1 IMPLANT
GLOVE SURG POLYISO LF SZ8 (GLOVE) ×1 IMPLANT
GLOVE SURG SS PI 7.0 STRL IVOR (GLOVE) IMPLANT
GOWN STRL REUS W/TWL LRG LVL3 (GOWN DISPOSABLE) ×1 IMPLANT
GOWN STRL REUS W/TWL XL LVL3 (GOWN DISPOSABLE) ×1 IMPLANT
KIT TURNOVER KIT A (KITS) ×1 IMPLANT
MANIFOLD NEPTUNE II (INSTRUMENTS) ×1 IMPLANT
NDL HYPO 21X1.5 SAFETY (NEEDLE) ×1 IMPLANT
NEEDLE HYPO 21X1.5 SAFETY (NEEDLE) ×1 IMPLANT
PACK BASIC LIMB (CUSTOM PROCEDURE TRAY) ×1 IMPLANT
PAD ARMBOARD 7.5X6 YLW CONV (MISCELLANEOUS) ×1 IMPLANT
POSITIONER HAND ALUMI XLG (MISCELLANEOUS) ×1 IMPLANT
POSITIONER HEAD 8X9X4 ADT (SOFTGOODS) ×1 IMPLANT
SET BASIN LINEN APH (SET/KITS/TRAYS/PACK) ×1 IMPLANT
SUT 3-0 BLK 1X30 PSL (SUTURE) ×1 IMPLANT
SYR CONTROL 10ML LL (SYRINGE) ×1 IMPLANT
WATER STERILE IRR 500ML POUR (IV SOLUTION) IMPLANT

## 2023-05-20 NOTE — Discharge Instructions (Signed)

## 2023-05-20 NOTE — Interval H&P Note (Signed)
 History and Physical Interval Note:  05/20/2023 7:17 AM  Leah Lynch  has presented today for surgery, with the diagnosis of TRIGGER FINGER RIGHT MIDDLE FINGER.  The various methods of treatment have been discussed with the patient and family. After consideration of risks, benefits and other options for treatment, the patient has consented to  Procedure(s): RELEASE TRIGGER FINGER/A-1 PULLEY RIGHT MIDDLE (Right) as a surgical intervention.  The patient's history has been reviewed, patient examined, no change in status, stable for surgery.  I have reviewed the patient's chart and labs.  Questions were answered to the patient's satisfaction.     Fuller Canada

## 2023-05-20 NOTE — Anesthesia Procedure Notes (Signed)
 Procedure Name: LMA Insertion Date/Time: 05/20/2023 7:35 AM  Performed by: Izola Price., CRNAPre-anesthesia Checklist: Patient identified, Emergency Drugs available, Suction available and Patient being monitored Patient Re-evaluated:Patient Re-evaluated prior to induction Oxygen Delivery Method: Circle System Utilized Preoxygenation: Pre-oxygenation with 100% oxygen Induction Type: IV induction Ventilation: Mask ventilation without difficulty LMA: LMA inserted LMA Size: 4.0 Number of attempts: 1 Placement Confirmation: positive ETCO2 Tube secured with: Tape Dental Injury: Teeth and Oropharynx as per pre-operative assessment

## 2023-05-20 NOTE — Transfer of Care (Signed)
 Immediate Anesthesia Transfer of Care Note  Patient: Leah Lynch  Procedure(s) Performed: RELEASE TRIGGER FINGER/A-1 PULLEY RIGHT MIDDLE (Right: Hand)  Patient Location: PACU  Anesthesia Type:General  Level of Consciousness: drowsy  Airway & Oxygen Therapy: Patient Spontanous Breathing and Patient connected to face mask oxygen  Post-op Assessment: Report given to RN and Post -op Vital signs reviewed and stable  Post vital signs: Reviewed and stable  Last Vitals:  Vitals Value Taken Time  BP 140/52 05/20/23 0810  Temp    Pulse 74 05/20/23 0812  Resp 11 05/20/23 0812  SpO2 100 % 05/20/23 0812  Vitals shown include unfiled device data.  Last Pain:  Vitals:   05/20/23 0646  TempSrc: Oral  PainSc: 0-No pain         Complications: No notable events documented.

## 2023-05-20 NOTE — Progress Notes (Signed)
 Patient able to wiggle fingers.

## 2023-05-20 NOTE — Brief Op Note (Signed)
 05/20/2023  8:08 AM  PATIENT:  Leah Lynch  78 y.o. female  PRE-OPERATIVE DIAGNOSIS:  RECURRENT TRIGGER FINGER RIGHT MIDDLE FINGER, CONTRACTURE RIGHT MIDDLE FINGER   POST-OPERATIVE DIAGNOSIS:  RECURRENT TRIGGER FINGER RIGHT MIDDLE FINGER, CONTRACTURE RIGHT MIDDLE FINGER.  PROCEDURE:  Procedure(s): RELEASE TRIGGER FINGER/A-1 PULLEY RIGHT MIDDLE FINGER WITH LYSIS OF ADHESIONS   SURGEON:  Surgeons and Role:    * Vickki Hearing, MD - Primary  PHYSICIAN ASSISTANT:   ASSISTANTS: none   ANESTHESIA:   general  EBL:  NONE    BLOOD ADMINISTERED:none  DRAINS: none   LOCAL MEDICATIONS USED:  MARCAINE     SPECIMEN:  No Specimen  DISPOSITION OF SPECIMEN:  N/A  COUNTS:  YES  TOURNIQUET:   Total Tourniquet Time Documented: Upper Arm (Right) - 16 minutes Total: Upper Arm (Right) - 16 minutes   DICTATION: .Reubin Milan Dictation  PLAN OF CARE: Discharge to home after PACU  PATIENT DISPOSITION:  PACU - hemodynamically stable.   Delay start of Pharmacological VTE agent (>24hrs) due to surgical blood loss or risk of bleeding: not applicable

## 2023-05-20 NOTE — Op Note (Signed)
 05/20/2023  8:08 AM  PATIENT:  Leah Lynch  78 y.o. female  PRE-OPERATIVE DIAGNOSIS:  RECURRENT TRIGGER FINGER RIGHT MIDDLE FINGER, CONTRACTURE RIGHT MIDDLE FINGER   POST-OPERATIVE DIAGNOSIS:  RECURRENT TRIGGER FINGER RIGHT MIDDLE FINGER, CONTRACTURE RIGHT MIDDLE FINGER.  PROCEDURE:  Procedure(s): RELEASE TRIGGER FINGER/A-1 PULLEY RIGHT MIDDLE FINGER WITH LYSIS OF ADHESIONS (tenolysis flexor tendon palm right long/middle finger)  CPT code 40981  SURGEON:  Surgeons and Role:    Vickki Hearing, MD - Primary  Findings:  Extensive adhesions and scarring around the A1 pulley  Anesthetic general LMA  Complications none  Blood loss none  Details of procedure  Ms. Heater was brought back to the operating room for general anesthesia.  She was on the table in the supine position with an armboard for the right upper extremity.  She was prepped and draped sterilely.  After sterile prep and drape timeout was completed  The limb was exsanguinated with a 4 inch Esmarch the tourniquet was elevated to 250 mmHg.  It stayed up for 16 minutes  2 cc of Marcaine was injected into the previous surgical incision  The surgical incision was opened and extended distally.  Blunt dissection was carried out until the A1 pulley was identified.  The finger flex normally it extended out to approximately 10 degrees shy of neutral position at the metacarpal phalangeal joint.  The tendon sheath was opened scar tissue was excised with scissors.  Flexion extension of the digit was confirmed.  Tendon function was confirmed as well.  Further inspection revealed no other lesions and the wound was irrigated and closed with seven 3-0 nylon sutures in interrupted fashion.  We then injected 8 cc of Marcaine  We applied a sterile dressing released the tourniquet applied pressure to the wound and then placed Ace wrap around the hand.  We confirmed good capillary refill color and return of flow to the  digit  Postoperative plan  We plan to remove the sutures in 10 to 14 days  Postop IV steroid was given to inhibit the inflammatory process that caused the scarring  Follow-up in 10 to 14 days

## 2023-05-20 NOTE — Anesthesia Preprocedure Evaluation (Signed)
 Anesthesia Evaluation  Patient identified by MRN, date of birth, ID band Patient awake    Reviewed: Allergy & Precautions, H&P , NPO status , Patient's Chart, lab work & pertinent test results, reviewed documented beta blocker date and time   History of Anesthesia Complications (+) PONV and history of anesthetic complications  Airway Mallampati: II  TM Distance: >3 FB Neck ROM: full    Dental no notable dental hx.    Pulmonary shortness of breath, Current Smoker and Patient abstained from smoking.   Pulmonary exam normal breath sounds clear to auscultation       Cardiovascular Exercise Tolerance: Good hypertension,  Rhythm:regular Rate:Normal     Neuro/Psych negative neurological ROS  negative psych ROS   GI/Hepatic Neg liver ROS,GERD  ,,  Endo/Other  diabetes    Renal/GU negative Renal ROS  negative genitourinary   Musculoskeletal   Abdominal   Peds  Hematology negative hematology ROS (+)   Anesthesia Other Findings   Reproductive/Obstetrics negative OB ROS                             Anesthesia Physical Anesthesia Plan  ASA: 2  Anesthesia Plan: General and General LMA   Post-op Pain Management:    Induction:   PONV Risk Score and Plan: Ondansetron  Airway Management Planned:   Additional Equipment:   Intra-op Plan:   Post-operative Plan:   Informed Consent: I have reviewed the patients History and Physical, chart, labs and discussed the procedure including the risks, benefits and alternatives for the proposed anesthesia with the patient or authorized representative who has indicated his/her understanding and acceptance.     Dental Advisory Given  Plan Discussed with: CRNA  Anesthesia Plan Comments:        Anesthesia Quick Evaluation

## 2023-05-21 ENCOUNTER — Encounter: Payer: Self-pay | Admitting: Orthopedic Surgery

## 2023-05-21 ENCOUNTER — Encounter (HOSPITAL_COMMUNITY): Payer: Self-pay | Admitting: Orthopedic Surgery

## 2023-05-21 ENCOUNTER — Telehealth: Payer: Self-pay | Admitting: Radiology

## 2023-05-21 DIAGNOSIS — L905 Scar conditions and fibrosis of skin: Secondary | ICD-10-CM

## 2023-05-21 NOTE — Telephone Encounter (Signed)
-----   Message from [place] sent at 05/21/2023  8:56 AM EST ----- Dr. Romeo Apple,   Patient called and wants to know if she needs to cancel her rehab on her hand since she just had surgery yesterday on her hand.   You can call her back or send her a message on [facility].   Please Advise ----- Message ----- From: Raina Mina Sent: 05/20/2023   8:45 AM EST To: Vickki Hearing, MD; Cherre Huger, RT; #  DONE ----- Message ----- From: Vickki Hearing, MD Sent: 05/20/2023   8:17 AM EST To: Cherre Huger, RT; Raina Mina; Rickard Rhymes, CMA; #  Arrange postop appointment from March 20 for suture removal

## 2023-05-21 NOTE — Anesthesia Postprocedure Evaluation (Signed)
 Anesthesia Post Note  Patient: Leah Lynch  Procedure(s) Performed: RELEASE TRIGGER FINGER/A-1 PULLEY RIGHT MIDDLE (Right: Hand)  Patient location during evaluation: Phase II Anesthesia Type: General Level of consciousness: awake Pain management: pain level controlled Vital Signs Assessment: post-procedure vital signs reviewed and stable Respiratory status: spontaneous breathing and respiratory function stable Cardiovascular status: blood pressure returned to baseline and stable Postop Assessment: no headache and no apparent nausea or vomiting Anesthetic complications: no Comments: Late entry   No notable events documented.   Last Vitals:  Vitals:   05/20/23 0845 05/20/23 0857  BP: (!) 127/59 124/69  Pulse: 72 85  Resp: (!) 9 18  Temp: (!) 36.4 C (!) 36.4 C  SpO2: 93% 94%    Last Pain:  Vitals:   05/20/23 0857  TempSrc:   PainSc: 0-No pain                 Windell Norfolk

## 2023-05-21 NOTE — Telephone Encounter (Signed)
 I saw he already replied. Closing encounter. Patient will resume on th e12th

## 2023-05-21 NOTE — Telephone Encounter (Signed)
 How long to hold therapy after hand surgery

## 2023-05-22 ENCOUNTER — Other Ambulatory Visit: Payer: Self-pay

## 2023-05-22 DIAGNOSIS — E119 Type 2 diabetes mellitus without complications: Secondary | ICD-10-CM

## 2023-05-22 DIAGNOSIS — I10 Essential (primary) hypertension: Secondary | ICD-10-CM

## 2023-05-22 MED ORDER — LISINOPRIL 2.5 MG PO TABS: 2.5000 mg | ORAL_TABLET | Freq: Every day | ORAL | 0 refills | Status: DC

## 2023-05-27 ENCOUNTER — Ambulatory Visit (HOSPITAL_COMMUNITY): Payer: 59 | Attending: Orthopedic Surgery | Admitting: Occupational Therapy

## 2023-05-27 ENCOUNTER — Encounter (HOSPITAL_COMMUNITY): Payer: Self-pay | Admitting: Occupational Therapy

## 2023-05-27 ENCOUNTER — Encounter (HOSPITAL_COMMUNITY): Payer: Self-pay

## 2023-05-27 ENCOUNTER — Ambulatory Visit (HOSPITAL_COMMUNITY): Payer: 59

## 2023-05-27 ENCOUNTER — Encounter (HOSPITAL_COMMUNITY): Payer: 59 | Admitting: Occupational Therapy

## 2023-05-27 DIAGNOSIS — M6281 Muscle weakness (generalized): Secondary | ICD-10-CM

## 2023-05-27 DIAGNOSIS — Z9181 History of falling: Secondary | ICD-10-CM | POA: Diagnosis not present

## 2023-05-27 DIAGNOSIS — M79644 Pain in right finger(s): Secondary | ICD-10-CM | POA: Insufficient documentation

## 2023-05-27 DIAGNOSIS — Z79899 Other long term (current) drug therapy: Secondary | ICD-10-CM | POA: Diagnosis not present

## 2023-05-27 DIAGNOSIS — R29898 Other symptoms and signs involving the musculoskeletal system: Secondary | ICD-10-CM | POA: Diagnosis not present

## 2023-05-27 DIAGNOSIS — E559 Vitamin D deficiency, unspecified: Secondary | ICD-10-CM | POA: Diagnosis not present

## 2023-05-27 DIAGNOSIS — M25512 Pain in left shoulder: Secondary | ICD-10-CM | POA: Diagnosis not present

## 2023-05-27 DIAGNOSIS — F17210 Nicotine dependence, cigarettes, uncomplicated: Secondary | ICD-10-CM | POA: Diagnosis not present

## 2023-05-27 DIAGNOSIS — Z96612 Presence of left artificial shoulder joint: Secondary | ICD-10-CM | POA: Insufficient documentation

## 2023-05-27 DIAGNOSIS — M25612 Stiffness of left shoulder, not elsewhere classified: Secondary | ICD-10-CM

## 2023-05-27 DIAGNOSIS — M539 Dorsopathy, unspecified: Secondary | ICD-10-CM | POA: Diagnosis not present

## 2023-05-27 DIAGNOSIS — R7303 Prediabetes: Secondary | ICD-10-CM | POA: Diagnosis not present

## 2023-05-27 DIAGNOSIS — Z0000 Encounter for general adult medical examination without abnormal findings: Secondary | ICD-10-CM | POA: Diagnosis not present

## 2023-05-27 DIAGNOSIS — M129 Arthropathy, unspecified: Secondary | ICD-10-CM | POA: Diagnosis not present

## 2023-05-27 DIAGNOSIS — E7800 Pure hypercholesterolemia, unspecified: Secondary | ICD-10-CM | POA: Diagnosis not present

## 2023-05-27 NOTE — Therapy (Signed)
 OUTPATIENT OCCUPATIONAL THERAPY ORTHO RE-EVALUATION  Patient Name: Leah Lynch MRN: [id number] DOB:November 16, 1945, 78 y.o., female Today's Date: 05/27/2023   END OF SESSION:  OT End of Session - 05/27/23 1603     Visit Number 6    Number of Visits 10    Date for OT Re-Evaluation 06/26/23    Authorization Type 1) [employer] Medicare Dual Complete  2) [place] Medicaid    Authorization Time Period no visit limit    Progress Note Due on Visit 10    OT Start Time 1517    OT Stop Time 1552    OT Time Calculation (min) 35 min    Activity Tolerance Patient tolerated treatment well    Behavior During Therapy WFL for tasks assessed/performed              Past Medical History:  Diagnosis Date   Fibrocystic breast    GERD (gastroesophageal reflux disease)    Hyperlipidemia    Hypertension    Osteoarthritis    Osteoporosis    PONV (postoperative nausea and vomiting)    Pre-diabetes    Prediabetes    Shortness of breath dyspnea    Past Surgical History:  Procedure Laterality Date   ABDOMINAL HYSTERECTOMY     BIOPSY  04/15/2018   Procedure: BIOPSY;  Surgeon: Corbin Ade, MD;  [facility]: [facility];  Service: Endoscopy;;  colon   BREAST CYST EXCISION Right    BREAST SURGERY     removal of cyst from right breast-fibrocystsic   BUNIONECTOMY  12/19/2010   Procedure: BUNIONECTOMY;  Surgeon: Dallas Schimke;  Location: [facility];  Service: Orthopedics;  Laterality: Right;  [name], [name] Bunionectomy   BUNIONECTOMY  05/22/2011   Procedure: Arbutus Leas;  Surgeon: Dallas Schimke, DPM;  Location: [facility];  Service: Orthopedics;  Laterality: Left;  [name] Bunionectomy Left Foot   CAPSULOTOMY  01/29/2012   Procedure: CAPSULOTOMY;  Surgeon: Dallas Schimke, DPM;  Location: [facility];  Service: Orthopedics;  Laterality: Left;   CATARACT EXTRACTION W/PHACO Right 08/09/2015   Procedure: CATARACT EXTRACTION PHACO AND INTRAOCULAR LENS PLACEMENT RIGHT EYE CDE=7.95;   Surgeon: Gemma Payor, MD;  Location: [facility];  Service: Ophthalmology;  Laterality: Right;   CATARACT EXTRACTION W/PHACO Left 08/27/2015   Procedure: CATARACT EXTRACTION PHACO AND INTRAOCULAR LENS PLACEMENT LEFT EYE; CDE:  6.12;  Surgeon: Gemma Payor, MD;  Location: [facility];  Service: Ophthalmology;  Laterality: Left;   COLONOSCOPY  2012   Dr. Jena Gauss: internal hemorrhoids, hyperplastic polyps. Surveillance 2022   COLONOSCOPY WITH PROPOFOL N/A 04/15/2018   one 7 mm polyp at splenic flexure, one 4 mm polyp in rectum, otherwise normal. S/p segmental biopsy:normal. Normal TI   DIAGNOSTIC LAPAROSCOPY     ESOPHAGOGASTRODUODENOSCOPY (EGD) WITH PROPOFOL N/A 04/15/2018   normal   FOOT GANGLION EXCISION     right foot   METATARSAL OSTEOTOMY  05/22/2011   Procedure: METATARSAL OSTEOTOMY;  Surgeon: Dallas Schimke, DPM;  Location: [facility];  Service: Orthopedics;  Laterality: Left;  [name] Osteotomy Left Foot   POLYPECTOMY  04/15/2018   Procedure: POLYPECTOMY;  Surgeon: Corbin Ade, MD;  [facility]: [facility];  Service: Endoscopy;;  colon   REVERSE SHOULDER ARTHROPLASTY Left 09/08/2022   Procedure: LEFT REVERSE SHOULDER ARTHROPLASTY;  Surgeon: Oliver Barre, MD;  Location: [facility];  Service: Orthopedics;  Laterality: Left;  RNFA NEEDED   TONSILLECTOMY  age 38   TRIGGER FINGER RELEASE Right 01/28/2018   Procedure: RIGHT TRIGGER THUMB RELEASE;  Surgeon: Vickki Hearing, MD;  Location: [facility];  Service: Orthopedics;  Laterality: Right;   TRIGGER FINGER RELEASE Right 02/17/2023   Procedure: RELEASE TRIGGER FINGER/A-1 PULLEY;  Surgeon: Vickki Hearing, MD;  Location: [facility];  Service: Orthopedics;  Laterality: Right;   TRIGGER FINGER RELEASE Right 05/20/2023   Procedure: RELEASE TRIGGER FINGER/A-1 PULLEY RIGHT MIDDLE;  Surgeon: Vickki Hearing, MD;  Location: [facility];  Service: Orthopedics;  Laterality: Right;   TUBAL LIGATION     Patient Active Problem List   Diagnosis Date Noted    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 05/21/2023   Need for pneumococcal 20-valent conjugate vaccination 02/19/2023   Trigger finger, right middle finger 02/17/2023   Need for influenza vaccination 11/18/2022   Controlled type 2 diabetes mellitus without complication, without long-term current use of insulin (HCC) 09/30/2022   Rotator cuff arthropathy of left shoulder 09/08/2022   Essential hypertension 09/03/2022   GERD (gastroesophageal reflux disease) 09/03/2022   Hyperlipidemia 09/03/2022   Osteoporosis 09/03/2022   Chronic musculoskeletal pain 09/03/2022   Rotator cuff arthropathy, left 09/03/2022   Screening for lung cancer 09/03/2022   Early satiety 02/22/2018   Loss of weight 02/22/2018   S/P trigger finger release right thumb 11/141/9 02/08/2018   Diarrhea 11/12/2016   Abdominal pain 11/12/2016   Nausea without vomiting 11/12/2016   PCP: Dr. Christel Mormon REFERRING PROVIDER: Dr. Fuller Canada  ONSET DATE: 02/17/23  REFERRING DIAG: Z61.096 (ICD-10-CM) - Trigger finger, right middle finger   THERAPY DIAG:  Pain in right finger(s)  Other symptoms and signs involving the musculoskeletal system  Rationale for Evaluation and Treatment: Rehabilitation  SUBJECTIVE:   SUBJECTIVE STATEMENT: S: They said I could come back to therapy after Monday (05/25/23).  PERTINENT HISTORY: Pt is a 78 y/o female s/p right 3rd digit trigger finger release on 02/17/23.  Pt reports extension lag and inability to achieve full flexion.   PRECAUTIONS: Other: ROM and progress as tolerated  WEIGHT BEARING RESTRICTIONS: No  PAIN:  Are you having pain? No  FALLS: Has patient fallen in last 6 months? Yes. Number of falls 1  PLOF: Independent  PATIENT GOALS: To be able to use her right hand functionally.   NEXT MD VISIT: 05/15/23  OBJECTIVE:  Note: Objective measures were completed at Evaluation unless otherwise noted.  HAND DOMINANCE: Right  ADLs: Pt reports  difficulty gripping and holding objects. Cannot fully extend or flex her middle finger. Pt has difficulty opening jar lids or caps. Pt has pain at incision site when holding objects or pushing on things.   FUNCTIONAL OUTCOME MEASURES: Quick Dash: 25 05/27/23: 56.82  UPPER EXTREMITY ROM:     Active ROM Right eval Right 05/27/23  Index MCP (0-90) 66   Index PIP (0-100) 58   Index DIP (0-70)  46   Long MCP (0-90)  72 80  Long PIP (0-100)  68 75  Long DIP (0-70)  42 50  Ring MCP (0-90) 76    Ring PIP (0-100)  90   Ring DIP (0-70)  46   Little MCP (0-90)  86   Little PIP (0-100)  78   Little DIP (0-70)  58   (Blank rows = not tested)   HAND FUNCTION: Grip strength: Right: 30 lbs; Left: 48 lbs, Lateral pinch: Right: 10 lbs, Left: 11 lbs, and 3 point pinch: Right: 8 lbs, Left: 10 lbs ** will reassess once stitches are out** Grip strength: Right:  lbs;  Lateral pinch: Right:  lbs, and 3 point pinch: Right:  lbs  COORDINATION: 9 Hole Peg test: Right: 25.21" sec; Left: 25.86" sec  SENSATION: WFL  EDEMA:  Right MCPs  Left MCPs 7.25cm  7.25cm  COGNITION: Overall cognitive status: Within functional limits for tasks assessed  OBSERVATIONS: edema and scar tissue at incision site at volar base of 2nd and 3rd digit MCPs   TREATMENT DATE:   05/27/23 -Re-Evaluation -Digit ROM: composite flexion, abduction, finger taps, opposition, x10 -Wrist ROM: flexion, extension, ulnar/radial deviation, supination/pronation, x10  05/13/23 -Manual Therapy: myofascial release to palmar aspect of hand in order to reduce fascial restrictions and pain, as well as improve ROM -Composite digit flexion and extension, 10 reps -Finger taps: 10 reps-able to lift 3rd digit off the table into extension approximately 1 cm -Grip strengthening: large, medium, and small beads hand gripper at 25# resistance, vertical -Red theraputty: rolling into a ball, flatten, rolling, pinching-lateral and 3 point; 2  rounds -Pinch strengthening: pt using red and green clothespin and 3 point pinch to grasp and stack high resistance sponges, then using lateral pinch to replace sponges into the bucket. Pt completing 4 towers of 5 sponges-2 with red clothespin and 2 with green clothespin  05/08/23 -Paraffin bath with moist heat, 10' -Manual Therapy: myofascial release, trigger point, applied to palmar aspect of hand in order to reduce fascial restrictions and pain, as well as improve ROM -P/ROM: 2nd and 3rd finger flexion to extension -Yellow Putty: roll into a ball, flatten into a pancake, PVC pipe to cut circles, roll into a log, tripod pinch x12, roll into a ball, squeeze x10  04/29/23 -Paraffin bath with moist heat, 10' -Manual Therapy: myofascial release, trigger point, applied to palmar aspect of hand in order to reduce fascial restrictions and pain, as well as improve ROM -P/ROM: 2nd and 3rd finger flexion to extension -Digit ROM: composite flexion, abduction/adduction, opposition, finger taps, x10 -Towel scrunches x10 -wringing out the towel, x10 -Yellow Putty: roll into a ball, flatten into a pancake                                                    PATIENT EDUCATION: Education details: Stop all exercises except for digit ROM and wrist ROM Person educated: Patient Education method: Programmer, multimedia, Demonstration, and Handouts Education comprehension: verbalized understanding and returned demonstration  HOME EXERCISE PROGRAM: Eval: finger A/ROM 2/5: Towel scrunches and wringing out 2/26: theraputty grip and pinch, red  GOALS: Goals reviewed with patient? Yes  SHORT TERM GOALS: Target date: 04/29/23  Pt will be provided with and educated on HEP to improve mobility in right hand required for use as dominant during ADL tasks.  Goal status: IN PROGRESS   LONG TERM GOALS: Target date: 05/27/23  Pt will increase A/ROM of right index and middle fingers by 10 degrees to improve ability to make a  full fist required for grasping items during ADLs.   Goal status: IN PROGRESS  2.  Pt will decrease pain in right hand to 2/10 or less to improve ability to use the right hand during housekeeping tasks.  Goal status: IN PROGRESS  3.  Pt will decrease right hand scar and fascial restrictions to minimal amounts to improve mobility in hand required for manipulating small objects.    Goal status: IN PROGRESS  4.  Pt  will increase right hand grip strength by 15# and pinch strength by 3# to improve ability to grip and open jars and container lids.   Goal status: IN PROGRESS    ASSESSMENT:  CLINICAL IMPRESSION: Pt presented to OT session status post R middle trigger finger release revision on 05/20/23. She still has 6 stiches in her hand and is unable to tolerate any gripping or stretching due to sharp pulling sensation. Her ROM has improved and strength will be reassessed next session once stiches are removed. OT providing education on limiting movements and resistance until stitches are out and starting back with exercises slowly, however to continue both digit and wrist ROM to keep from becoming too stiff. OT recommending 4 more sessions at 1x per week for continued work on improving strengthening, ROM, and mobility.   PERFORMANCE DEFICITS: in functional skills including ADLs, IADLs, coordination, dexterity, ROM, strength, pain, fascial restrictions, Fine motor control, and UE functional use   PLAN:  OT FREQUENCY: 1x/week  OT DURATION: 6 weeks  PLANNED INTERVENTIONS: 97168 OT Re-evaluation, 97535 self care/ADL training, 40981 therapeutic exercise, 97530 therapeutic activity, 97140 manual therapy, 97035 ultrasound, 97018 paraffin, 19147 electrical stimulation unattended, patient/family education, and DME and/or AE instructions  CONSULTED AND AGREED WITH PLAN OF CARE: Patient  PLAN FOR NEXT SESSION: Follow up on HEP, manual techniques, A/ROM, gentle grip/pinch strengthening   Trish Mage, OTR/L [phone] 05/27/2023, 4:05 PM

## 2023-05-27 NOTE — Therapy (Signed)
 OUTPATIENT PHYSICAL THERAPY TREATMENT Patient Name: Leah Lynch MRN: [id number] DOB:08/17/1945, 78 y.o., female Today's Date: 05/27/2023  PCP: Billie Lade, MD REFERRING PROVIDER: Oliver Barre, MD  END OF SESSION:  Visit Number 4  Number of Visits 8   Date for PT Re-Evaluation 06/15/23   Authorization Type [employer] Dual complete   Authorization Time Period no auth; no limit   Progress Note Due on Visit 8   PT Start Time 1348  PT Stop Time 1428  PT Time Calculation (min) 40 min   Activity Tolerance Patient tolerated treatment well   Behavior During Therapy [facility] for tasks assessed/performed     PT End of Session - 05/27/23 1438     Visit Number 5    Number of Visits 8    Date for PT Re-Evaluation 06/15/23    Authorization Type [employer] Dual complete    Authorization Time Period no auth; no limit    Progress Note Due on Visit 8    PT Start Time 1435    PT Stop Time 1513    PT Time Calculation (min) 38 min    Activity Tolerance Patient tolerated treatment well    Behavior During Therapy WFL for tasks assessed/performed               Past Medical History:  Diagnosis Date   Fibrocystic breast    GERD (gastroesophageal reflux disease)    Hyperlipidemia    Hypertension    Osteoarthritis    Osteoporosis    PONV (postoperative nausea and vomiting)    Pre-diabetes    Prediabetes    Shortness of breath dyspnea    Past Surgical History:  Procedure Laterality Date   ABDOMINAL HYSTERECTOMY     BIOPSY  04/15/2018   Procedure: BIOPSY;  Surgeon: Corbin Ade, MD;  [facility];  Service: Endoscopy;;  colon   BREAST CYST EXCISION Right    BREAST SURGERY     removal of cyst from right breast-fibrocystsic   BUNIONECTOMY  12/19/2010   Procedure: BUNIONECTOMY;  Surgeon: Dallas Schimke;  Location: [facility];  Service: Orthopedics;  Laterality: Right;  [name], [name] Bunionectomy   BUNIONECTOMY  05/22/2011   Procedure: Arbutus Leas;  Surgeon:  Dallas Schimke, DPM;  Location: [facility];  Service: Orthopedics;  Laterality: Left;  [name] Bunionectomy Left Foot   CAPSULOTOMY  01/29/2012   Procedure: CAPSULOTOMY;  Surgeon: Dallas Schimke, DPM;  Location: [facility];  Service: Orthopedics;  Laterality: Left;   CATARACT EXTRACTION W/PHACO Right 08/09/2015   Procedure: CATARACT EXTRACTION PHACO AND INTRAOCULAR LENS PLACEMENT RIGHT EYE CDE=7.95;  Surgeon: Gemma Payor, MD;  Location: [facility];  Service: Ophthalmology;  Laterality: Right;   CATARACT EXTRACTION W/PHACO Left 08/27/2015   Procedure: CATARACT EXTRACTION PHACO AND INTRAOCULAR LENS PLACEMENT LEFT EYE; CDE:  6.12;  Surgeon: Gemma Payor, MD;  Location: [facility];  Service: Ophthalmology;  Laterality: Left;   COLONOSCOPY  2012   Dr. Jena Gauss: internal hemorrhoids, hyperplastic polyps. Surveillance 2022   COLONOSCOPY WITH PROPOFOL N/A 04/15/2018   one 7 mm polyp at splenic flexure, one 4 mm polyp in rectum, otherwise normal. S/p segmental biopsy:normal. Normal TI   DIAGNOSTIC LAPAROSCOPY     ESOPHAGOGASTRODUODENOSCOPY (EGD) WITH PROPOFOL N/A 04/15/2018   normal   FOOT GANGLION EXCISION     right foot   METATARSAL OSTEOTOMY  05/22/2011   Procedure: METATARSAL OSTEOTOMY;  Surgeon: Dallas Schimke, DPM;  Location: [facility];  Service: Orthopedics;  Laterality: Left;  [name] Osteotomy Left Foot   POLYPECTOMY  04/15/2018   Procedure: POLYPECTOMY;  Surgeon: Corbin Ade, MD;  [facility];  Service: Endoscopy;;  colon   REVERSE SHOULDER ARTHROPLASTY Left 09/08/2022   Procedure: LEFT REVERSE SHOULDER ARTHROPLASTY;  Surgeon: Oliver Barre, MD;  Location: [facility];  Service: Orthopedics;  Laterality: Left;  RNFA NEEDED   TONSILLECTOMY  age 31   TRIGGER FINGER RELEASE Right 01/28/2018   Procedure: RIGHT TRIGGER THUMB RELEASE;  Surgeon: Vickki Hearing, MD;  Location: [facility];  Service: Orthopedics;  Laterality: Right;   TRIGGER FINGER RELEASE Right 02/17/2023   Procedure:  RELEASE TRIGGER FINGER/A-1 PULLEY;  Surgeon: Vickki Hearing, MD;  Location: [facility];  Service: Orthopedics;  Laterality: Right;   TRIGGER FINGER RELEASE Right 05/20/2023   Procedure: RELEASE TRIGGER FINGER/A-1 PULLEY RIGHT MIDDLE;  Surgeon: Vickki Hearing, MD;  Location: [facility];  Service: Orthopedics;  Laterality: Right;   TUBAL LIGATION     Patient Active Problem List   Diagnosis Date Noted   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 05/21/2023   Need for pneumococcal 20-valent conjugate vaccination 02/19/2023   Trigger finger, right middle finger 02/17/2023   Need for influenza vaccination 11/18/2022   Controlled type 2 diabetes mellitus without complication, without long-term current use of insulin (HCC) 09/30/2022   Rotator cuff arthropathy of left shoulder 09/08/2022   Essential hypertension 09/03/2022   GERD (gastroesophageal reflux disease) 09/03/2022   Hyperlipidemia 09/03/2022   Osteoporosis 09/03/2022   Chronic musculoskeletal pain 09/03/2022   Rotator cuff arthropathy, left 09/03/2022   Screening for lung cancer 09/03/2022   Early satiety 02/22/2018   Loss of weight 02/22/2018   S/P trigger finger release right thumb 11/141/9 02/08/2018   Diarrhea 11/12/2016   Abdominal pain 11/12/2016   Nausea without vomiting 11/12/2016    ONSET DATE: Billie Lade, MD  REFERRING DIAG: Oliver Barre, MD next apt: 06/02/23  THERAPY DIAG:  Muscle weakness (generalized)  Stiffness of left shoulder, not elsewhere classified  Acute pain of left shoulder  Rationale for Evaluation and Treatment: Rehabilitation  SUBJECTIVE:   SUBJECTIVE STATEMENT: 05/27/23:  Reports 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  No reports of Lt shoulder pain.  Evaluation: Pt had R TSA reverse in June of 2024. Pt continues with limited ROM and wants to improve it. Pt reports limitations hair brushing, limitations in cutting her own hair.  Difficulty reporting putting her hand behind  her back. Pt reports she doesn't have the strength to hold on to heavy objects when lifting overhead. Reports mild numbness in left anterior shoulder region.   Pt accompanied by: self  PERTINENT HISTORY:   Left reverse TSA on 09/08/2022  PRECAUTIONS: None  RED FLAGS: None   WEIGHT BEARING RESTRICTIONS: No  PAIN:  Are you having pain? Yes: NPRS scale: 7/10 worst at End ROM Pain location: Left anterior-lateral shoulder region  Pain description: Sharp Aggravating factors: Overhead lifting Relieving factors: avoiding lifting  FALLS: Has patient fallen in last 6 months? Yes. Number of falls 1   PATIENT GOALS: "get it up a little bit higher with reduced pain"  OBJECTIVE:  Note: Objective measures were completed at Evaluation unless otherwise noted.  HAND DOMINANCE: Right  ADLs: Overall ADLs: modified independent, limitations with hair brushing Transfers/ambulation related to ADLs: Independent Grooming: limited Upper body dressing: independent Toileting: independent Bathing: independent  FUNCTIONAL OUTCOME MEASURES: Quick Dash: 15.9/100= 15.9%  UPPER EXTREMITY ROM:  Active ROM Right eval Left eval Left 05/13/23 Left 05/27/23:  Shoulder flexion 160 105A; 125P 150 AAROM AROM: 112; AAROM: 150  Shoulder abduction 150 90A; 120P 125 AAROM AAROM 125  Shoulder adduction      Shoulder extension      Shoulder internal rotation      Shoulder external rotation 75 15A; 25P 50 AAROM AROM 28; AAROM 50  Elbow flexion      Elbow extension      Wrist flexion      Wrist extension      Wrist ulnar deviation      Wrist radial deviation      Wrist pronation      Wrist supination      (Blank rows = not tested)   UPPER EXTREMITY MMT:     MMT Right eval Left eval  Shoulder flexion 4- 2+  Shoulder abduction 4- 2+  Shoulder adduction    Shoulder extension    Shoulder internal rotation    Shoulder external rotation 3+ 2+  Middle trapezius 3+ 3+  Lower trapezius 3 2  Elbow  flexion    Elbow extension    Wrist flexion    Wrist extension    Wrist ulnar deviation    Wrist radial deviation    Wrist pronation    Wrist supination    (Blank rows = not tested)   SENSATION: WFL  COGNITION: Overall cognitive status: Within functional limits for tasks assessed   TREATMENT DATE:  06/02/23: Sidelying: Abduction 2x 10 ER 2x 10 Seated: Flexion Abduction Standing: Isometric ER 5x 5" Abduction 5x 5" Flexion 5x 5" Extensoin 5x 5" 10 cones placed on/off 2nd shelf  Manual PROM for Lt shoulder ROM, STM to anterior shoulder/pecs  05/13/23 Seated physioball for forward flexion 10X  Physioball for Lt abduction 10X  Physioball for rotation Lt 5X, Rt 5X Standing Lt isometric flexion, abd, IR/ER 10X5" each Manual PROM for Lt shoulder ROM, STM to ant shldr/pecs, scapular mobs  Measurements of AAROM above chart  05/08/23: Reviewed current OT HEP  Manual: PROM, STM to anterior shoulder/pecs, scapular mobs, scar tissue mobilization  Standing: Isometric flexion, abd and ER, extension: 5x 5"  Seated theraball flexion, abduction, IR, ER with some assistance behind chair.  04/30/23 Goal review Seated:  Lt UE flexion 10X  Lt UE abduction 10X  Lt UE ER 10X  Lt empty can 10X  Scapular retractions 10X  Shoulder rolls 10X Rt sidelying Lt abduction 10X  LT ER 10X Supine:  wand flexion 10X  Wand abduction 10X   04/20/2023 PT Evaluation    And HEP Below                                                                                                                            PATIENT EDUCATION: Education details: PT Evaluation, findings, prognosis, frequency, attendance policy, and HEP if given.  Person educated: Patient Education method: Explanation, Demonstration, Tactile cues, and Verbal cues Education  comprehension: verbalized understanding and returned demonstration  HOME EXERCISE PROGRAM: Access Code: [email or web address] URL:  [email or web address] Date: 05/08/2023 Prepared by: Becky Sax  Exercises - Sidelying Shoulder External Rotation  - 1 x daily - 7 x weekly - 3 sets - 12-15 reps - Sidelying Shoulder Abduction Palm Forward  - 1 x daily - 7 x weekly - 3 sets - 12-15 reps - Sidelying Shoulder Flexion 15 Degrees  - 1 x daily - 7 x weekly - 3 sets - 12-15 reps  OT HEP includes wall slides, shoulder protraction/retractions, horizontal abduction, sidelying ER/abduction, towel stretch for IR, corner stretch, posterior capsule stretch, ER stretch, wand exercises, jumping jack, scapular retraction, Vslide with theraband, theraband: protract, horizontal abd, IR, ER, flexoin, abduction  GOALS: Goals reviewed with patient? Yes  SHORT TERM GOALS: Target date: 05/18/2023  Pt will be independent with HEP in order to demonstrate participation in Physical Therapy POC.  Baseline: Goal status: IN PROGRESS  2.  Pt will 4/10 pain during mobility in order to demonstrate improved pain with functional activities.  Baseline:  Goal status: IN PROGRESS  LONG TERM GOALS: Target date: 06/15/2023  Pt will improve Shoulder flexion MMT by >1/2 grade in order to demonstrate improved functional strength to return to desired activities.  Baseline: See objective.  Goal status: IN PROGRESS  2.  Pt will improve Shoulder Abduction MMT by >1/2 grade in order to demonstrate improved functional ambulatory capacity in community setting.  Baseline: See objective.  Goal status: IN PROGRESS  3.  Pt will improve QuickDASH score by 3 points in order to demonstrate improved pain with functional goals and outcomes. Baseline: See objective.  Goal status: IN PROGRESS  4.  Pt will improve LUE AROM by 10 degrees in order to improve capacity during functional overhead activities.. Baseline: See objective.  Goal status: IN PROGRESS  5.  Pt will 2/10 pain during mobility in order to demonstrate improved pain with functional  activities.  Baseline:  Goal status: IN PROGRESS  ASSESSMENT:  CLINICAL IMPRESSION: Session focus with Lt shoulder ROM and strengthening.  Added isometrics for strengthening to HEP with printout given.  Exercises complete against gravity resistance with visible musculature fatigue.  Only pain mentioned with PROM abduction motion around 80-95 degrees, resolved following.  Pt continues to demonstrate limited ROM and significant weakness.   Evaluation: Pt is demonstrating moderate ADL limitations, overhead reaching limitations due to muscle weakness and ROM in left shoulder. Prognosis in progression due to timeline from surgery on date in 08/2022. [name] Pt will benefit from skilled Physical Therapy services to address deficits/limitations in order to improve functional and QOL.    PERFORMANCE DEFICITS: in functional skills including ADLs, ROM, strength, and pain,.   IMPAIRMENTS: are limiting patient from ADLs and education.   COMORBIDITIES: has no other co-morbidities that affects occupational performance. Patient will benefit from skilled OT to address above impairments and improve overall function.  MODIFICATION OR ASSISTANCE TO COMPLETE EVALUATION: No modification of tasks or assist necessary to complete an evaluation.  OT OCCUPATIONAL PROFILE AND HISTORY: Problem focused assessment: Including review of records relating to presenting problem.  CLINICAL DECISION MAKING: LOW - limited treatment options, no task modification necessary  REHAB POTENTIAL: Fair > 6 months from surgery of total reverse shoulder arthroplasty  EVALUATION COMPLEXITY: Low   PLAN:  PT FREQUENCY: 1x/week  PT DURATION: 8 weeks  PLANNED INTERVENTIONS: 97535 self care/ADL training, 52841 therapeutic exercise, 97530 therapeutic activity, 97112 neuromuscular re-education, 97140 manual therapy, [id number] electrical stimulation (manual), 97014  electrical stimulation unattended, 16109 mechanical traction, passive range of  motion, and 97164 PT-Re-evaluation    CONSULTED AND AGREED WITH PLAN OF CARE: Patient  PLAN FOR NEXT SESSION: isolated L shoulder flexion/abduction and ER strengthening.  Establish strong HEP.    Becky Sax, LPTA/CLT; CBIS [phone]  Juel Burrow, PTA 05/27/2023, 3:23 PM

## 2023-05-28 DIAGNOSIS — Z79899 Other long term (current) drug therapy: Secondary | ICD-10-CM | POA: Diagnosis not present

## 2023-05-29 ENCOUNTER — Telehealth: Payer: Self-pay

## 2023-05-29 ENCOUNTER — Encounter: Payer: Self-pay | Admitting: Internal Medicine

## 2023-05-29 ENCOUNTER — Ambulatory Visit: Admitting: Internal Medicine

## 2023-05-29 VITALS — BP 132/62 | HR 69 | Ht 59.0 in | Wt 134.6 lb

## 2023-05-29 DIAGNOSIS — E1169 Type 2 diabetes mellitus with other specified complication: Secondary | ICD-10-CM | POA: Diagnosis not present

## 2023-05-29 DIAGNOSIS — K219 Gastro-esophageal reflux disease without esophagitis: Secondary | ICD-10-CM | POA: Diagnosis not present

## 2023-05-29 DIAGNOSIS — E782 Mixed hyperlipidemia: Secondary | ICD-10-CM | POA: Diagnosis not present

## 2023-05-29 DIAGNOSIS — I10 Essential (primary) hypertension: Secondary | ICD-10-CM

## 2023-05-29 DIAGNOSIS — J4520 Mild intermittent asthma, uncomplicated: Secondary | ICD-10-CM

## 2023-05-29 DIAGNOSIS — Z1231 Encounter for screening mammogram for malignant neoplasm of breast: Secondary | ICD-10-CM

## 2023-05-29 DIAGNOSIS — E119 Type 2 diabetes mellitus without complications: Secondary | ICD-10-CM

## 2023-05-29 MED ORDER — ALBUTEROL SULFATE HFA 108 (90 BASE) MCG/ACT IN AERS: 2.0000 | INHALATION_SPRAY | Freq: Four times a day (QID) | RESPIRATORY_TRACT | 0 refills | Status: DC | PRN

## 2023-05-29 MED ORDER — GABAPENTIN 300 MG PO CAPS: 300.0000 mg | ORAL_CAPSULE | Freq: Three times a day (TID) | ORAL | 3 refills | Status: AC | PRN

## 2023-05-29 MED ORDER — LISINOPRIL 2.5 MG PO TABS: 2.5000 mg | ORAL_TABLET | Freq: Every day | ORAL | 3 refills | Status: AC

## 2023-05-29 MED ORDER — PROAIR HFA 108 (90 BASE) MCG/ACT IN AERS: 2.0000 | INHALATION_SPRAY | Freq: Four times a day (QID) | RESPIRATORY_TRACT | 3 refills | Status: DC | PRN

## 2023-05-29 MED ORDER — ATENOLOL 25 MG PO TABS: 25.0000 mg | ORAL_TABLET | Freq: Every day | ORAL | 3 refills | Status: AC

## 2023-05-29 MED ORDER — NALOXONE HCL 4 MG/0.1ML NA LIQD: 1.0000 | Freq: Once | NASAL | 0 refills | Status: AC

## 2023-05-29 MED ORDER — ATORVASTATIN CALCIUM 80 MG PO TABS: 80.0000 mg | ORAL_TABLET | Freq: Every day | ORAL | 3 refills | Status: AC

## 2023-05-29 MED ORDER — CHLORTHALIDONE 25 MG PO TABS: 25.0000 mg | ORAL_TABLET | Freq: Every day | ORAL | 3 refills | Status: AC

## 2023-05-29 MED ORDER — NAPROXEN 500 MG PO TABS: 500.0000 mg | ORAL_TABLET | Freq: Every day | ORAL | 1 refills | Status: DC | PRN

## 2023-05-29 MED ORDER — OMEPRAZOLE 20 MG PO CPDR: 20.0000 mg | DELAYED_RELEASE_CAPSULE | Freq: Every day | ORAL | 3 refills | Status: AC

## 2023-05-29 NOTE — Assessment & Plan Note (Signed)
 A1c 6.5 on labs from earlier this month.  Remains well-controlled with diet alone. -Urine microalbumin/creatinine ratio ordered today.  Additional diabetes related preventative care items are up-to-date.

## 2023-05-29 NOTE — Patient Instructions (Signed)
 It was a pleasure to see you today.  Thank you for giving [place] the opportunity to be involved in your care.  Below is a brief recap of your visit and next steps.  We will plan to see you again in 3 months.  Summary Medications refilled today Check urine study Follow up in 3 months I recommend starting daily vitamin D supplementation 2000 international units. This can be purchased at your pharmacy. I also recommend using fluticasone nasal spray for runny nose

## 2023-05-29 NOTE — Assessment & Plan Note (Signed)
 Lipid panel updated in December 2024.  Total cholesterol 156 and LDL 65.  Adequately controlled with atorvastatin 80 mg daily.  Refill provided today.

## 2023-05-29 NOTE — Assessment & Plan Note (Signed)
 Remains adequately controlled on current antihypertensive regimen.  Refills provided today.

## 2023-05-29 NOTE — Addendum Note (Signed)
 Addended by: Juliane Lack D on: 05/29/2023 10:08 AM   Modules accepted: Orders

## 2023-05-29 NOTE — Progress Notes (Signed)
 Established Patient Office Visit  Subjective   Patient ID: Leah Lynch, female    DOB: 11-22-1945  Age: 78 y.o. MRN: [id number]  Chief Complaint  Patient presents with   vitamin d    Patient concerned for her vitamin D   Nasal Congestion    Runny nose on right side   [name] returns care today for routine follow-up.  She was last evaluated by me in December 2024.  No medication changes were made at that time, repeat labs ordered, and 41-month follow-up arranged.  In the interim, she has been seen by orthopedic surgery and underwent surgical intervention for lysis of adhesions of the right middle finger on 3/5 following recent trigger finger release of the right middle finger in December.  There have otherwise been no acute interval events.  [name] reports feeling well today.  She is asymptomatic currently.  Her acute concern is requesting medication refills.  She does not have any additional concerns to discuss.  Past Medical History:  Diagnosis Date   Fibrocystic breast    GERD (gastroesophageal reflux disease)    Hyperlipidemia    Hypertension    Osteoarthritis    Osteoporosis    PONV (postoperative nausea and vomiting)    Pre-diabetes    Prediabetes    Shortness of breath dyspnea    Past Surgical History:  Procedure Laterality Date   ABDOMINAL HYSTERECTOMY     BIOPSY  04/15/2018   Procedure: BIOPSY;  Surgeon: Corbin Ade, MD;  Location: [facility];  Service: Endoscopy;;  colon   BREAST CYST EXCISION Right    BREAST SURGERY     removal of cyst from right breast-fibrocystsic   BUNIONECTOMY  12/19/2010   Procedure: BUNIONECTOMY;  Surgeon: Dallas Schimke;  Location: [facility];  Service: Orthopedics;  Laterality: Right;  [name], [name] Bunionectomy   BUNIONECTOMY  05/22/2011   Procedure: Arbutus Leas;  Surgeon: Dallas Schimke, DPM;  Location: [facility];  Service: Orthopedics;  Laterality: Left;  [name] Bunionectomy Left Foot    CAPSULOTOMY  01/29/2012   Procedure: CAPSULOTOMY;  Surgeon: Dallas Schimke, DPM;  Location: [facility];  Service: Orthopedics;  Laterality: Left;   CATARACT EXTRACTION W/PHACO Right 08/09/2015   Procedure: CATARACT EXTRACTION PHACO AND INTRAOCULAR LENS PLACEMENT RIGHT EYE CDE=7.95;  Surgeon: Gemma Payor, MD;  Location: [facility];  Service: Ophthalmology;  Laterality: Right;   CATARACT EXTRACTION W/PHACO Left 08/27/2015   Procedure: CATARACT EXTRACTION PHACO AND INTRAOCULAR LENS PLACEMENT LEFT EYE; CDE:  6.12;  Surgeon: Gemma Payor, MD;  Location: [facility];  Service: Ophthalmology;  Laterality: Left;   COLONOSCOPY  2012   Dr. Jena Gauss: internal hemorrhoids, hyperplastic polyps. Surveillance 2022   COLONOSCOPY WITH PROPOFOL N/A 04/15/2018   one 7 mm polyp at splenic flexure, one 4 mm polyp in rectum, otherwise normal. S/p segmental biopsy:normal. Normal TI   DIAGNOSTIC LAPAROSCOPY     ESOPHAGOGASTRODUODENOSCOPY (EGD) WITH PROPOFOL N/A 04/15/2018   normal   FOOT GANGLION EXCISION     right foot   METATARSAL OSTEOTOMY  05/22/2011   Procedure: METATARSAL OSTEOTOMY;  Surgeon: Dallas Schimke, DPM;  Location: [facility];  Service: Orthopedics;  Laterality: Left;  [name] Osteotomy Left Foot   POLYPECTOMY  04/15/2018   Procedure: POLYPECTOMY;  Surgeon: Corbin Ade, MD;  Location: [facility];  Service: Endoscopy;;  colon   REVERSE SHOULDER ARTHROPLASTY Left 09/08/2022   Procedure: LEFT REVERSE SHOULDER ARTHROPLASTY;  Surgeon: Oliver Barre, MD;  Location: [facility];  Service: Orthopedics;  Laterality: Left;  RNFA NEEDED   TONSILLECTOMY  age 71   TRIGGER FINGER RELEASE Right 01/28/2018   Procedure: RIGHT TRIGGER THUMB RELEASE;  Surgeon: Vickki Hearing, MD;  Location: [facility];  Service: Orthopedics;  Laterality: Right;   TRIGGER FINGER RELEASE Right 02/17/2023   Procedure: RELEASE TRIGGER FINGER/A-1 PULLEY;  Surgeon: Vickki Hearing, MD;  Location: [facility];  Service: Orthopedics;  Laterality:  Right;   TRIGGER FINGER RELEASE Right 05/20/2023   Procedure: RELEASE TRIGGER FINGER/A-1 PULLEY RIGHT MIDDLE;  Surgeon: Vickki Hearing, MD;  Location: [facility];  Service: Orthopedics;  Laterality: Right;   TUBAL LIGATION     Social History   Tobacco Use   Smoking status: Every Day    Current packs/day: 0.00    Average packs/day: 1.5 packs/day for 47.4 years (71.0 ttl pk-yrs)    Types: Cigarettes    Start date: 03/18/1975    Last attempt to quit: 07/27/2022    Years since quitting: 0.8   Smokeless tobacco: Never  Vaping Use   Vaping status: Never Used  Substance Use Topics   Alcohol use: No   Drug use: No   Family History  Problem Relation Age of Onset   Anesthesia problems Neg Hx    Hypotension Neg Hx    Malignant hyperthermia Neg Hx    Pseudochol deficiency Neg Hx    Colon cancer Neg Hx    Allergies  Allergen Reactions   Bee Venom Swelling   Penicillins Hives, Itching and Other (See Comments)    Has patient had a PCN reaction causing immediate rash, facial/tongue/throat swelling, SOB or lightheadedness with hypotension: Yes Has patient had a PCN reaction causing severe rash involving mucus membranes or skin necrosis: No Has patient had a PCN reaction that required hospitalization No Has patient had a PCN reaction occurring within the last 10 years: YES If all of the above answers are "NO", then may proceed with Cephalosporin use.    Latex Itching   Sulfa Antibiotics Itching   Review of Systems  Constitutional:  Negative for chills and fever.  HENT:  Positive for congestion. Negative for sore throat.   Respiratory:  Negative for cough and shortness of breath.   Cardiovascular:  Negative for chest pain, palpitations and leg swelling.  Gastrointestinal:  Negative for abdominal pain, blood in stool, constipation, diarrhea, nausea and vomiting.  Genitourinary:  Negative for dysuria and hematuria.  Musculoskeletal:  Negative for myalgias.  Skin:  Negative for itching and  rash.  Neurological:  Negative for dizziness and headaches.  Psychiatric/Behavioral:  Negative for depression and suicidal ideas.      Objective:     BP 132/62   Pulse 69   Ht 4\' 11"  (1.499 m)   Wt 134 lb 9.6 oz (61.1 kg)   SpO2 92%   BMI 27.19 kg/m  BP Readings from Last 3 Encounters:  05/29/23 132/62  05/20/23 124/69  03/03/23 131/74   Physical Exam Vitals reviewed.  Constitutional:      General: She is not in acute distress.    Appearance: Normal appearance. She is not toxic-appearing.  HENT:     Head: Normocephalic and atraumatic.     Right Ear: External ear normal.     Left Ear: External ear normal.     Nose: Nose normal. No congestion or rhinorrhea.     Mouth/Throat:     Mouth: Mucous membranes are moist.     Pharynx: Oropharynx is clear. No oropharyngeal exudate or  posterior oropharyngeal erythema.  Eyes:     General: No scleral icterus.    Extraocular Movements: Extraocular movements intact.     Conjunctiva/sclera: Conjunctivae normal.     Pupils: Pupils are equal, round, and reactive to light.  Cardiovascular:     Rate and Rhythm: Normal rate and regular rhythm.     Pulses: Normal pulses.     Heart sounds: Normal heart sounds. No murmur heard.    No friction rub. No gallop.  Pulmonary:     Effort: Pulmonary effort is normal.     Breath sounds: Normal breath sounds. No wheezing, rhonchi or rales.  Abdominal:     General: Abdomen is flat. Bowel sounds are normal. There is no distension.     Palpations: Abdomen is soft.     Tenderness: There is no abdominal tenderness.  Musculoskeletal:        General: No swelling. Normal range of motion.     Cervical back: Normal range of motion.     Right lower leg: No edema.     Left lower leg: No edema.     Comments: Bandage on right hand  Lymphadenopathy:     Cervical: No cervical adenopathy.  Skin:    General: Skin is warm and dry.     Capillary Refill: Capillary refill takes less than 2 seconds.      Coloration: Skin is not jaundiced.  Neurological:     General: No focal deficit present.     Mental Status: She is alert and oriented to person, place, and time.  Psychiatric:        Mood and Affect: Mood normal.        Behavior: Behavior normal.   Last CBC Lab Results  Component Value Date   WBC 9.5 05/18/2023   HGB 12.8 05/18/2023   HCT 41.1 05/18/2023   MCV 89.7 05/18/2023   MCH 27.9 05/18/2023   RDW 15.9 (H) 05/18/2023   PLT 287 05/18/2023   Last metabolic panel Lab Results  Component Value Date   GLUCOSE 94 05/18/2023   NA 139 05/18/2023   K 4.0 05/18/2023   CL 101 05/18/2023   CO2 28 05/18/2023   BUN 26 (H) 05/18/2023   CREATININE 0.81 05/18/2023   GFRNONAA >60 05/18/2023   CALCIUM 9.6 05/18/2023   PROT 6.6 08/27/2022   ALBUMIN 4.3 08/27/2022   LABGLOB 2.3 08/27/2022   AGRATIO 1.9 08/27/2022   BILITOT 0.2 08/27/2022   ALKPHOS 125 (H) 08/27/2022   AST 26 08/27/2022   ALT 23 08/27/2022   ANIONGAP 10 05/18/2023   Last lipids Lab Results  Component Value Date   CHOL 156 02/18/2023   HDL 75 02/18/2023   LDLCALC 65 02/18/2023   TRIG 88 02/18/2023   CHOLHDL 2.1 02/18/2023   Last hemoglobin A1c Lab Results  Component Value Date   HGBA1C 6.5 (H) 05/18/2023   Last thyroid functions Lab Results  Component Value Date   TSH 5.080 (H) 08/27/2022   Last vitamin D Lab Results  Component Value Date   VD25OH 34.2 08/27/2022   Last vitamin B12 and Folate Lab Results  Component Value Date   VITAMINB12 852 08/27/2022   FOLATE 16.5 08/27/2022   The 10-year ASCVD risk score (Arnett DK, et al., 2019) is: 62.9%    Assessment & Plan:   Problem List Items Addressed This Visit       Essential hypertension   Remains adequately controlled on current antihypertensive regimen.  Refills provided today.  GERD (gastroesophageal reflux disease)   Symptoms remain adequately controlled with omeprazole 20 mg daily.  Refill provided today.      Controlled type  2 diabetes mellitus without complication, without long-term current use of insulin (HCC)   A1c 6.5 on labs from earlier this month.  Remains well-controlled with diet alone. -Urine microalbumin/creatinine ratio ordered today.  Additional diabetes related preventative care items are up-to-date.      Hyperlipidemia   Lipid panel updated in December 2024.  Total cholesterol 156 and LDL 65.  Adequately controlled with atorvastatin 80 mg daily.  Refill provided today.      Breast cancer screening by mammogram   She will be due for screening mammogram in June.  Orders placed today.      Return in about 3 months (around 08/29/2023).   Billie Lade, MD

## 2023-05-29 NOTE — Telephone Encounter (Signed)
 New rx sent, old discontinued

## 2023-05-29 NOTE — Assessment & Plan Note (Signed)
 Symptoms remain adequately controlled with omeprazole 20 mg daily.  Refill provided today.

## 2023-05-29 NOTE — Assessment & Plan Note (Signed)
 She will be due for screening mammogram in June.  Orders placed today.

## 2023-05-31 ENCOUNTER — Encounter: Payer: Self-pay | Admitting: Internal Medicine

## 2023-05-31 LAB — MICROALBUMIN / CREATININE URINE RATIO
Creatinine, Urine: 148.4 mg/dL
Microalb/Creat Ratio: 15 mg/g{creat} (ref 0–29)
Microalbumin, Urine: 21.7 ug/mL

## 2023-06-02 ENCOUNTER — Ambulatory Visit (INDEPENDENT_AMBULATORY_CARE_PROVIDER_SITE_OTHER): Payer: 59 | Admitting: Orthopedic Surgery

## 2023-06-02 ENCOUNTER — Other Ambulatory Visit (INDEPENDENT_AMBULATORY_CARE_PROVIDER_SITE_OTHER): Payer: Self-pay

## 2023-06-02 ENCOUNTER — Encounter: Payer: Self-pay | Admitting: Orthopedic Surgery

## 2023-06-02 DIAGNOSIS — Z96612 Presence of left artificial shoulder joint: Secondary | ICD-10-CM

## 2023-06-02 NOTE — Progress Notes (Signed)
 Orthopaedic Postop Note  Assessment: Leah Lynch is a 78 y.o. female s/p Left Reverse Shoulder Arthroplasty  DOS: 09/08/2022  Plan: Leah Lynch has some stiffness in her left shoulder.  She denies pain, except at extremes of motion.  Urged her to continue working with therapy, and doing exercises on her own.  She may continue to see improvements for up to a year following surgery.  Provided reassurance.  Radiographs look very good.  No issues with the incision.  Follow-up in 3 months.  Follow-up: Return in about 3 months (around 09/02/2023).  XR at next visit: Left shoulder  Subjective:  Chief Complaint  Patient presents with   Routine Post Op    L RSA DOS: 09/08/2022    History of Present Illness: Leah Lynch is a 78 y.o. female who presents following the above stated procedure.  Surgery was approximately 9 months ago.  She is still working with therapy.  She notes some continued improvement.  However, she continues to have restricted motion in the left shoulder.  She is unable to get her hand behind her back.  She has some difficulty getting her left hand to her head.  No numbness or tingling.  No issues with her incision.   Review of Systems: No fevers or chills No numbness or tingling No Chest Pain No shortness of breath   Objective: There were no vitals taken for this visit.  Physical Exam:  Alert and oriented.  No acute distress.  Surgical incision is healed.  No surrounding erythema or drainage.  Improved peri-incisional numbness.  110 degrees of abduction at her side.  140 degrees of active forward elevation.  Limited external rotation at her side.  Internal rotation to her back pocket.   IMAGING: I personally ordered and reviewed the following images:  X-rays of the left shoulder were obtained in clinic today.  These are compared to prior x-rays.  Left reverse shoulder arthroplasty remains in stable position.  No periprosthetic lucency.  No  fractures.  The shoulder joint is reduced.  No subsidence of the implants.  Impression: Left reverse shoulder arthroplasty in unchanged alignment  Oliver Barre, MD 06/02/2023 9:57 AM

## 2023-06-03 ENCOUNTER — Encounter (HOSPITAL_COMMUNITY): Payer: 59 | Admitting: Occupational Therapy

## 2023-06-03 ENCOUNTER — Ambulatory Visit (HOSPITAL_COMMUNITY): Payer: 59 | Admitting: Physical Therapy

## 2023-06-03 DIAGNOSIS — M25512 Pain in left shoulder: Secondary | ICD-10-CM | POA: Diagnosis not present

## 2023-06-03 DIAGNOSIS — M6281 Muscle weakness (generalized): Secondary | ICD-10-CM | POA: Diagnosis not present

## 2023-06-03 DIAGNOSIS — M25612 Stiffness of left shoulder, not elsewhere classified: Secondary | ICD-10-CM

## 2023-06-03 DIAGNOSIS — R29898 Other symptoms and signs involving the musculoskeletal system: Secondary | ICD-10-CM | POA: Diagnosis not present

## 2023-06-03 DIAGNOSIS — M79644 Pain in right finger(s): Secondary | ICD-10-CM | POA: Diagnosis not present

## 2023-06-03 DIAGNOSIS — Z96612 Presence of left artificial shoulder joint: Secondary | ICD-10-CM | POA: Diagnosis not present

## 2023-06-03 NOTE — Therapy (Signed)
 OUTPATIENT PHYSICAL THERAPY TREATMENT Patient Name: Leah Lynch MRN: [id number] DOB:May 05, 1945, 78 y.o., female Today's Date: 06/03/2023  PCP: Billie Lade, MD REFERRING PROVIDER: Oliver Barre, MD  END OF SESSION:   PT End of Session - 06/03/23 1424     Visit Number 6    Number of Visits 8    Date for PT Re-Evaluation 06/15/23    Authorization Type [employer] Dual complete    Authorization Time Period no auth; no limit    Progress Note Due on Visit 8    PT Start Time 1346    PT Stop Time 1424    PT Time Calculation (min) 38 min    Activity Tolerance Patient tolerated treatment well    Behavior During Therapy WFL for tasks assessed/performed                Past Medical History:  Diagnosis Date   Fibrocystic breast    GERD (gastroesophageal reflux disease)    Hyperlipidemia    Hypertension    Osteoarthritis    Osteoporosis    PONV (postoperative nausea and vomiting)    Pre-diabetes    Prediabetes    Shortness of breath dyspnea    Past Surgical History:  Procedure Laterality Date   ABDOMINAL HYSTERECTOMY     BIOPSY  04/15/2018   Procedure: BIOPSY;  Surgeon: Corbin Ade, MD;  [facility];  Service: Endoscopy;;  colon   BREAST CYST EXCISION Right    BREAST SURGERY     removal of cyst from right breast-fibrocystsic   BUNIONECTOMY  12/19/2010   Procedure: BUNIONECTOMY;  Surgeon: Dallas Schimke;  Location: [facility];  Service: Orthopedics;  Laterality: Right;  [name], [name] Bunionectomy   BUNIONECTOMY  05/22/2011   Procedure: Arbutus Leas;  Surgeon: Dallas Schimke, DPM;  Location: [facility];  Service: Orthopedics;  Laterality: Left;  [name] Bunionectomy Left Foot   CAPSULOTOMY  01/29/2012   Procedure: CAPSULOTOMY;  Surgeon: Dallas Schimke, DPM;  Location: [facility];  Service: Orthopedics;  Laterality: Left;   CATARACT EXTRACTION W/PHACO Right 08/09/2015   Procedure: CATARACT EXTRACTION PHACO AND INTRAOCULAR LENS  PLACEMENT RIGHT EYE CDE=7.95;  Surgeon: Gemma Payor, MD;  Location: [facility];  Service: Ophthalmology;  Laterality: Right;   CATARACT EXTRACTION W/PHACO Left 08/27/2015   Procedure: CATARACT EXTRACTION PHACO AND INTRAOCULAR LENS PLACEMENT LEFT EYE; CDE:  6.12;  Surgeon: Gemma Payor, MD;  Location: [facility];  Service: Ophthalmology;  Laterality: Left;   COLONOSCOPY  2012   Dr. Jena Gauss: internal hemorrhoids, hyperplastic polyps. Surveillance 2022   COLONOSCOPY WITH PROPOFOL N/A 04/15/2018   one 7 mm polyp at splenic flexure, one 4 mm polyp in rectum, otherwise normal. S/p segmental biopsy:normal. Normal TI   DIAGNOSTIC LAPAROSCOPY     ESOPHAGOGASTRODUODENOSCOPY (EGD) WITH PROPOFOL N/A 04/15/2018   normal   FOOT GANGLION EXCISION     right foot   METATARSAL OSTEOTOMY  05/22/2011   Procedure: METATARSAL OSTEOTOMY;  Surgeon: Dallas Schimke, DPM;  [facility]: [facility];  Service: Orthopedics;  Laterality: Left;  [name] Osteotomy Left Foot   POLYPECTOMY  04/15/2018   Procedure: POLYPECTOMY;  Surgeon: Corbin Ade, MD;  [facility];  Service: Endoscopy;;  colon   REVERSE SHOULDER ARTHROPLASTY Left 09/08/2022   Procedure: LEFT REVERSE SHOULDER ARTHROPLASTY;  Surgeon: Oliver Barre, MD;  Location: [facility];  Service: Orthopedics;  Laterality: Left;  RNFA NEEDED   TONSILLECTOMY  age 81   TRIGGER FINGER RELEASE Right 01/28/2018  Procedure: RIGHT TRIGGER THUMB RELEASE;  Surgeon: Vickki Hearing, MD;  Location: [facility];  Service: Orthopedics;  Laterality: Right;   TRIGGER FINGER RELEASE Right 02/17/2023   Procedure: RELEASE TRIGGER FINGER/A-1 PULLEY;  Surgeon: Vickki Hearing, MD;  Location: [facility];  Service: Orthopedics;  Laterality: Right;   TRIGGER FINGER RELEASE Right 05/20/2023   Procedure: RELEASE TRIGGER FINGER/A-1 PULLEY RIGHT MIDDLE;  Surgeon: Vickki Hearing, MD;  Location: [facility];  Service: Orthopedics;  Laterality: Right;   TUBAL LIGATION     Patient Active Problem List    Diagnosis Date Noted   Breast cancer screening by mammogram 05/29/2023   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 05/21/2023   Need for pneumococcal 20-valent conjugate vaccination 02/19/2023   Trigger finger, right middle finger 02/17/2023   Need for influenza vaccination 11/18/2022   Controlled type 2 diabetes mellitus without complication, without long-term current use of insulin (HCC) 09/30/2022   Rotator cuff arthropathy of left shoulder 09/08/2022   Essential hypertension 09/03/2022   GERD (gastroesophageal reflux disease) 09/03/2022   Hyperlipidemia 09/03/2022   Osteoporosis 09/03/2022   Chronic musculoskeletal pain 09/03/2022   Rotator cuff arthropathy, left 09/03/2022   Screening for lung cancer 09/03/2022   Early satiety 02/22/2018   Loss of weight 02/22/2018   S/P trigger finger release right thumb 11/141/9 02/08/2018   Diarrhea 11/12/2016   Abdominal pain 11/12/2016   Nausea without vomiting 11/12/2016    ONSET DATE: Billie Lade, MD  REFERRING DIAG: Oliver Barre, MD next apt: 06/02/23  THERAPY DIAG:  Muscle weakness (generalized)  Stiffness of left shoulder, not elsewhere classified  Acute pain of left shoulder  Rationale for Evaluation and Treatment: Rehabilitation  SUBJECTIVE:   SUBJECTIVE STATEMENT: Pt states that she is a little sore  PERTINENT HISTORY:   Left reverse TSA on 09/08/2022  PRECAUTIONS: None  RED FLAGS: None   WEIGHT BEARING RESTRICTIONS: No  PAIN:  Are you having pain? Yes: NPRS scale: 2/10  Pain location: Left anterior-lateral shoulder region  Pain description: Sharp Aggravating factors: Overhead lifting Relieving factors: avoiding lifting  FALLS: Has patient fallen in last 6 months? Yes. Number of falls 1   PATIENT GOALS: "get it up a little bit higher with reduced pain"  OBJECTIVE:  Note: Objective measures were completed at Evaluation unless otherwise noted.  HAND DOMINANCE:  Right  ADLs: Overall ADLs: modified independent, limitations with hair brushing Transfers/ambulation related to ADLs: Independent Grooming: limited Upper body dressing: independent Toileting: independent Bathing: independent  FUNCTIONAL OUTCOME MEASURES: Quick Dash: 15.9/100= 15.9%  UPPER EXTREMITY ROM:     Active ROM Right eval Left eval Left 05/13/23 Left 05/27/23:  Shoulder flexion 160 105A; 125P 150 AAROM AROM: 112; AAROM: 150  Shoulder abduction 150 90A; 120P 125 AAROM AAROM 125  Shoulder adduction      Shoulder extension      Shoulder internal rotation      Shoulder external rotation 75 15A; 25P 50 AAROM AROM 28; AAROM 50  Elbow flexion      Elbow extension      Wrist flexion      Wrist extension      Wrist ulnar deviation      Wrist radial deviation      Wrist pronation      Wrist supination      (Blank rows = not tested)   UPPER EXTREMITY MMT:     MMT Right eval Left eval  Shoulder flexion 4- 2+  Shoulder abduction 4- 2+  Shoulder adduction    Shoulder extension    Shoulder internal rotation    Shoulder external rotation 3+ 2+  Middle trapezius 3+ 3+  Lower trapezius 3 2  Elbow flexion    Elbow extension    Wrist flexion    Wrist extension    Wrist ulnar deviation    Wrist radial deviation    Wrist pronation    Wrist supination    (Blank rows = not tested)   TREATMENT DATE:  06/03/23 Sitting: Pulley flexion /abduction x 2 minutes each. Sitting: Ball forward flexion stretch x 10 Ball Lt abduction x 10  Ball around the [place] stretch x 5 clockwise/5 counter clock wids B UE flexion without ball x 5 B UE abduction without ball x 5 B ER x 10  PROM  PNF 1 and 2 x 10 each  Standing :  ladder walking x 5  06/02/23: Sidelying: Abduction 2x 10 ER 2x 10 Seated: Flexion Abduction Standing: Isometric ER 5x 5" Abduction 5x 5" Flexion 5x 5" Extensoin 5x 5" 10 cones placed on/off 2nd shelf  Manual PROM for Lt shoulder ROM, STM to anterior  shoulder/pecs  PATIENT EDUCATION: Education details: PT Evaluation, findings, prognosis, frequency, attendance policy, and HEP if given.  Person educated: Patient Education method: Explanation, Demonstration, Tactile cues, and Verbal cues Education comprehension: verbalized understanding and returned demonstration  HOME EXERCISE PROGRAM: Access Code: [email or web address]  Seated Shoulder Flexion Full Range  - 2 x daily - 7 x weekly - 1 sets - 10 reps - Seated Shoulder Abduction Full Range  - 2 x daily - 7 x weekly - 1 sets - 10 reps - Seated Shoulder External Rotation  - 2 x daily - 7 x weekly - 1 sets - 10 reps URL: [email or web address] Date: 05/08/2023 Prepared by: Becky Sax  Exercises - Sidelying Shoulder External Rotation  - 1 x daily - 7 x weekly - 3 sets - 12-15 reps - Sidelying Shoulder Abduction Palm Forward  - 1 x daily - 7 x weekly - 3 sets - 12-15 reps - Sidelying Shoulder Flexion 15 Degrees  - 1 x daily - 7 x weekly - 3 sets - 12-15 reps  OT HEP includes wall slides, shoulder protraction/retractions, horizontal abduction, sidelying ER/abduction, towel stretch for IR, corner stretch, posterior capsule stretch, ER stretch, wand exercises, jumping jack, scapular retraction, Vslide with theraband, theraband: protract, horizontal abd, IR, ER, flexoin, abduction  GOALS: Goals reviewed with patient? Yes  SHORT TERM GOALS: Target date: 05/18/2023  Pt will be independent with HEP in order to demonstrate participation in Physical Therapy POC.  Baseline: Goal status: MET  2.  Pt will 4/10 pain during mobility in order to demonstrate improved pain with functional activities.  Baseline:  Goal status: MET  LONG TERM GOALS: Target date: 06/15/2023  Pt will improve Shoulder flexion MMT by >1/2 grade in order to demonstrate improved functional strength to return to desired activities.  Baseline: See objective.  Goal status: MET  2.  Pt will improve Shoulder Abduction MMT  by >1/2 grade in order to demonstrate improved functional ambulatory capacity in community setting.  Baseline: See objective.  Goal status: MET  3.  Pt will improve QuickDASH score by 3 points in order to demonstrate improved pain with functional goals and outcomes. Baseline: See objective.  Goal status: IN PROGRESS  4.  Pt will improve LUE AROM by 10 degrees in order to improve capacity during functional overhead activities.. Baseline: See objective.  Goal status: IN PROGRESS  5.  Pt will 2/10 pain during mobility in order to demonstrate improved pain with functional activities.  Baseline:  Goal status: IN PROGRESS  ASSESSMENT:  CLINICAL IMPRESSION: Session focused on improving both ROM and strength.  Pt demonstrating improved ROM and strength noted.  PT verbalizing completing HEP without difficulty.  Pt will continue to benefit from skilled PT to improve her functional ability.   Evaluation: Pt is demonstrating moderate ADL limitations, overhead reaching limitations due to muscle weakness and ROM in left shoulder. Prognosis in progression due to timeline from surgery on date in 08/2022. [name] Pt will benefit from skilled Physical Therapy services to address deficits/limitations in order to improve functional and QOL.    PERFORMANCE DEFICITS: in functional skills including ADLs, ROM, strength, and pain,.   IMPAIRMENTS: are limiting patient from ADLs and education.   COMORBIDITIES: has no other co-morbidities that affects occupational performance. Patient will benefit from skilled OT to address above impairments and improve overall function.  MODIFICATION OR ASSISTANCE TO COMPLETE EVALUATION: No modification of tasks or assist necessary to complete an evaluation.  OT OCCUPATIONAL PROFILE AND HISTORY: Problem focused assessment: Including review of records relating to presenting problem.  CLINICAL DECISION MAKING: LOW - limited treatment options, no task modification necessary  REHAB  POTENTIAL: Fair > 6 months from surgery of total reverse shoulder arthroplasty  EVALUATION COMPLEXITY: Low   PLAN:  PT FREQUENCY: 1x/week  PT DURATION: 8 weeks  PLANNED INTERVENTIONS: 97535 self care/ADL training, 16109 therapeutic exercise, 97530 therapeutic activity, 97112 neuromuscular re-education, 97140 manual therapy, [id number] electrical stimulation (manual), 97014 electrical stimulation unattended, 97102 mechanical traction, passive range of motion, and 97164 PT-Re-evaluation    CONSULTED AND AGREED WITH PLAN OF CARE: Patient  PLAN FOR NEXT SESSION: isolated L shoulder flexion/abduction and ER strengthening.  Establish strong HEP.   Virgina Organ, PT CLT [phone]  06/03/2023, 1434

## 2023-06-04 ENCOUNTER — Encounter (HOSPITAL_COMMUNITY): Payer: Self-pay | Admitting: Occupational Therapy

## 2023-06-04 ENCOUNTER — Ambulatory Visit: Admitting: Orthopedic Surgery

## 2023-06-04 ENCOUNTER — Ambulatory Visit (HOSPITAL_COMMUNITY): Admitting: Occupational Therapy

## 2023-06-04 DIAGNOSIS — M6281 Muscle weakness (generalized): Secondary | ICD-10-CM | POA: Diagnosis not present

## 2023-06-04 DIAGNOSIS — M25512 Pain in left shoulder: Secondary | ICD-10-CM | POA: Diagnosis not present

## 2023-06-04 DIAGNOSIS — R29898 Other symptoms and signs involving the musculoskeletal system: Secondary | ICD-10-CM | POA: Diagnosis not present

## 2023-06-04 DIAGNOSIS — M65331 Trigger finger, right middle finger: Secondary | ICD-10-CM

## 2023-06-04 DIAGNOSIS — M79644 Pain in right finger(s): Secondary | ICD-10-CM | POA: Diagnosis not present

## 2023-06-04 DIAGNOSIS — M25612 Stiffness of left shoulder, not elsewhere classified: Secondary | ICD-10-CM | POA: Diagnosis not present

## 2023-06-04 DIAGNOSIS — Z96612 Presence of left artificial shoulder joint: Secondary | ICD-10-CM | POA: Diagnosis not present

## 2023-06-04 NOTE — Progress Notes (Signed)
   There were no vitals taken for this visit.  There is no height or weight on file to calculate BMI.  Chief Complaint  Patient presents with   Post-op Follow-up    Repeat right middle trigger finger release May 20 2023 sutures removed     Encounter Diagnosis  Name Primary?   Trigger finger, right middle finger s/p release 05/20/23 Yes    DOI/DOS/ Date: 05/20/23  Sutures were removed.  Patient still extremely stiff at the MP joint.  Recommend night splinting  OT  6-week checkup

## 2023-06-04 NOTE — Therapy (Unsigned)
 OUTPATIENT OCCUPATIONAL THERAPY ORTHO TREATMENT NOTE  Patient Name: Leah Lynch MRN: [id number] DOB:1945/08/12, 78 y.o., female Today's Date: 06/05/2023   END OF SESSION:  OT End of Session - 06/04/23 1437     Visit Number 7    Number of Visits 10    Date for OT Re-Evaluation 06/26/23    Authorization Type 1) [employer] Medicare Dual Complete  2) [place] Medicaid    Authorization Time Period no visit limit    Progress Note Due on Visit 10    OT Start Time 1355    OT Stop Time 1437    OT Time Calculation (min) 42 min    Activity Tolerance Patient tolerated treatment well    Behavior During Therapy WFL for tasks assessed/performed              Past Medical History:  Diagnosis Date   Fibrocystic breast    GERD (gastroesophageal reflux disease)    Hyperlipidemia    Hypertension    Osteoarthritis    Osteoporosis    PONV (postoperative nausea and vomiting)    Pre-diabetes    Prediabetes    Shortness of breath dyspnea    Past Surgical History:  Procedure Laterality Date   ABDOMINAL HYSTERECTOMY     BIOPSY  04/15/2018   Procedure: BIOPSY;  Surgeon: Corbin Ade, MD;  [facility];  Service: Endoscopy;;  colon   BREAST CYST EXCISION Right    BREAST SURGERY     removal of cyst from right breast-fibrocystsic   BUNIONECTOMY  12/19/2010   Procedure: BUNIONECTOMY;  Surgeon: Dallas Schimke;  [facility]: [facility];  Service: Orthopedics;  Laterality: Right;  [name], [name] Bunionectomy   BUNIONECTOMY  05/22/2011   Procedure: Arbutus Leas;  Surgeon: Dallas Schimke, DPM;  [facility]: [facility];  Service: Orthopedics;  Laterality: Left;  [name] Bunionectomy Left Foot   CAPSULOTOMY  01/29/2012   Procedure: CAPSULOTOMY;  Surgeon: Dallas Schimke, DPM;  [facility];  Service: Orthopedics;  Laterality: Left;   CATARACT EXTRACTION W/PHACO Right 08/09/2015   Procedure: CATARACT EXTRACTION PHACO AND INTRAOCULAR LENS PLACEMENT RIGHT EYE CDE=7.95;   Surgeon: Gemma Payor, MD;  [facility];  Service: Ophthalmology;  Laterality: Right;   CATARACT EXTRACTION W/PHACO Left 08/27/2015   Procedure: CATARACT EXTRACTION PHACO AND INTRAOCULAR LENS PLACEMENT LEFT EYE; CDE:  6.12;  Surgeon: Gemma Payor, MD;  [facility]: [facility];  Service: Ophthalmology;  Laterality: Left;   COLONOSCOPY  2012   Dr. Jena Gauss: internal hemorrhoids, hyperplastic polyps. Surveillance 2022   COLONOSCOPY WITH PROPOFOL N/A 04/15/2018   one 7 mm polyp at splenic flexure, one 4 mm polyp in rectum, otherwise normal. S/p segmental biopsy:normal. Normal TI   DIAGNOSTIC LAPAROSCOPY     ESOPHAGOGASTRODUODENOSCOPY (EGD) WITH PROPOFOL N/A 04/15/2018   normal   FOOT GANGLION EXCISION     right foot   METATARSAL OSTEOTOMY  05/22/2011   Procedure: METATARSAL OSTEOTOMY;  Surgeon: Dallas Schimke, DPM;  [facility];  Service: Orthopedics;  Laterality: Left;  [name] Osteotomy Left Foot   POLYPECTOMY  04/15/2018   Procedure: POLYPECTOMY;  Surgeon: Corbin Ade, MD;  [facility];  Service: Endoscopy;;  colon   REVERSE SHOULDER ARTHROPLASTY Left 09/08/2022   Procedure: LEFT REVERSE SHOULDER ARTHROPLASTY;  Surgeon: Oliver Barre, MD;  [facility];  Service: Orthopedics;  Laterality: Left;  RNFA NEEDED   TONSILLECTOMY  age 30   TRIGGER FINGER RELEASE Right 01/28/2018   Procedure: RIGHT TRIGGER THUMB RELEASE;  Surgeon: Vickki Hearing, MD;  [facility];  Service: Orthopedics;  Laterality: Right;   TRIGGER FINGER RELEASE Right 02/17/2023   Procedure: RELEASE TRIGGER FINGER/A-1 PULLEY;  Surgeon: Vickki Hearing, MD;  [facility]: [facility];  Service: Orthopedics;  Laterality: Right;   TRIGGER FINGER RELEASE Right 05/20/2023   Procedure: RELEASE TRIGGER FINGER/A-1 PULLEY RIGHT MIDDLE;  Surgeon: Vickki Hearing, MD;  Location: [facility];  Service: Orthopedics;  Laterality: Right;   TUBAL LIGATION     Patient Active Problem List   Diagnosis Date Noted   Breast  cancer screening by mammogram 05/29/2023   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 05/21/2023   Need for pneumococcal 20-valent conjugate vaccination 02/19/2023   Trigger finger, right middle finger 02/17/2023   Need for influenza vaccination 11/18/2022   Controlled type 2 diabetes mellitus without complication, without long-term current use of insulin (HCC) 09/30/2022   Rotator cuff arthropathy of left shoulder 09/08/2022   Essential hypertension 09/03/2022   GERD (gastroesophageal reflux disease) 09/03/2022   Hyperlipidemia 09/03/2022   Osteoporosis 09/03/2022   Chronic musculoskeletal pain 09/03/2022   Rotator cuff arthropathy, left 09/03/2022   Screening for lung cancer 09/03/2022   Early satiety 02/22/2018   S/P trigger finger release right thumb 11/141/9 02/08/2018   Nausea without vomiting 11/12/2016   PCP: Dr. Christel Mormon REFERRING PROVIDER: Dr. Fuller Canada  ONSET DATE: 02/17/23  REFERRING DIAG: Z61.096 (ICD-10-CM) - Trigger finger, right middle finger   THERAPY DIAG:  Pain in right finger(s)  Other symptoms and signs involving the musculoskeletal system  Rationale for Evaluation and Treatment: Rehabilitation  SUBJECTIVE:   SUBJECTIVE STATEMENT: S: "It's still dropping a lot."  PERTINENT HISTORY: Pt is a 78 y/o female s/p right 3rd digit trigger finger release on 02/17/23.  Pt reports extension lag and inability to achieve full flexion.   PRECAUTIONS: Other: ROM and progress as tolerated  WEIGHT BEARING RESTRICTIONS: No  PAIN:  Are you having pain? No  FALLS: Has patient fallen in last 6 months? Yes. Number of falls 1  PLOF: Independent  PATIENT GOALS: To be able to use her right hand functionally.   NEXT MD VISIT: 05/15/23  OBJECTIVE:  Note: Objective measures were completed at Evaluation unless otherwise noted.  HAND DOMINANCE: Right  ADLs: Pt reports difficulty gripping and holding objects. Cannot fully extend or flex her  middle finger. Pt has difficulty opening jar lids or caps. Pt has pain at incision site when holding objects or pushing on things.   FUNCTIONAL OUTCOME MEASURES: Quick Dash: 25 05/27/23: 56.82  UPPER EXTREMITY ROM:     Active ROM Right eval Right 05/27/23  Index MCP (0-90) 66   Index PIP (0-100) 58   Index DIP (0-70)  46   Long MCP (0-90)  72 80  Long PIP (0-100)  68 75  Long DIP (0-70)  42 50  Ring MCP (0-90) 76    Ring PIP (0-100)  90   Ring DIP (0-70)  46   Little MCP (0-90)  86   Little PIP (0-100)  78   Little DIP (0-70)  58   (Blank rows = not tested)   HAND FUNCTION: Grip strength: Right: 30 lbs; Left: 48 lbs, Lateral pinch: Right: 10 lbs, Left: 11 lbs, and 3 point pinch: Right: 8 lbs, Left: 10 lbs ** will reassess once stitches are out** Grip strength: Right:  lbs; Lateral pinch: Right:  lbs, and 3 point pinch: Right:  lbs  COORDINATION: 9 Hole Peg test: Right: 25.21" sec; Left: 25.86" sec  SENSATION: WFL  EDEMA:  Right MCPs  Left MCPs 7.25cm  7.25cm  COGNITION: Overall cognitive status: Within functional limits for tasks assessed  OBSERVATIONS: edema and scar tissue at incision site at volar base of 2nd and 3rd digit MCPs   TREATMENT DATE:   06/04/23 -Manual Therapy: myofascial release to palmar aspect of hand in order to reduce fascial restrictions and pain, as well as improve ROM -P/ROM: digit extension to flexion, abduction, x10 -Digit ROM: composite flexion, abduction, finger taps, opposition, x10 -Fabrication of digit extension splint  05/27/23 -Re-Evaluation -Digit ROM: composite flexion, abduction, finger taps, opposition, x10 -Wrist ROM: flexion, extension, ulnar/radial deviation, supination/pronation, x10  05/13/23 -Manual Therapy: myofascial release to palmar aspect of hand in order to reduce fascial restrictions and pain, as well as improve ROM -Composite digit flexion and extension, 10 reps -Finger taps: 10 reps-able to lift 3rd digit off  the table into extension approximately 1 cm -Grip strengthening: large, medium, and small beads hand gripper at 25# resistance, vertical -Red theraputty: rolling into a ball, flatten, rolling, pinching-lateral and 3 point; 2 rounds -Pinch strengthening: pt using red and green clothespin and 3 point pinch to grasp and stack high resistance sponges, then using lateral pinch to replace sponges into the bucket. Pt completing 4 towers of 5 sponges-2 with red clothespin and 2 with green clothespin   PATIENT EDUCATION: Education details: Splint wear and care Person educated: Patient Education method: Explanation, Demonstration, and Handouts Education comprehension: verbalized understanding and returned demonstration  HOME EXERCISE PROGRAM: Eval: finger A/ROM 2/5: Towel scrunches and wringing out 2/26: theraputty grip and pinch, red  GOALS: Goals reviewed with patient? Yes  SHORT TERM GOALS: Target date: 04/29/23  Pt will be provided with and educated on HEP to improve mobility in right hand required for use as dominant during ADL tasks.  Goal status: IN PROGRESS   LONG TERM GOALS: Target date: 05/27/23  Pt will increase A/ROM of right index and middle fingers by 10 degrees to improve ability to make a full fist required for grasping items during ADLs.   Goal status: IN PROGRESS  2.  Pt will decrease pain in right hand to 2/10 or less to improve ability to use the right hand during housekeeping tasks.  Goal status: IN PROGRESS  3.  Pt will decrease right hand scar and fascial restrictions to minimal amounts to improve mobility in hand required for manipulating small objects.    Goal status: IN PROGRESS  4.  Pt will increase right hand grip strength by 15# and pinch strength by 3# to improve ability to grip and open jars and container lids.   Goal status: IN PROGRESS    ASSESSMENT:  CLINICAL IMPRESSION: This session pt is s/p stitches removal with continued stiffness and tendon  tightness along the middle digit tendon through the palm. OT provided manual therapy and passive stretching this session to reduce tension and pain. At the end of the session OT fabricated a night time digit extension splint to prevent flexion during sleep.   PERFORMANCE DEFICITS: in functional skills including ADLs, IADLs, coordination, dexterity, ROM, strength, pain, fascial restrictions, Fine motor control, and UE functional use   PLAN:  OT FREQUENCY: 1x/week  OT DURATION: 6 weeks  PLANNED INTERVENTIONS: 97168 OT Re-evaluation, 97535 self care/ADL training, 59563 therapeutic exercise, 97530 therapeutic activity, 97140 manual therapy, 97035 ultrasound, 97018 paraffin, 87564 electrical stimulation unattended, patient/family education, and DME and/or AE  instructions  CONSULTED AND AGREED WITH PLAN OF CARE: Patient  PLAN FOR NEXT SESSION: Follow up on HEP, manual techniques, A/ROM, gentle grip/pinch strengthening   Trish Mage, OTR/L [phone] 06/05/2023, 8:19 AM

## 2023-06-04 NOTE — Progress Notes (Signed)
   There were no vitals taken for this visit.  There is no height or weight on file to calculate BMI.  Chief Complaint  Patient presents with   Post-op Follow-up    Repeat right middle trigger finger release May 20 2023    Encounter Diagnosis  Name Primary?   Trigger finger, right middle finger s/p release 05/20/23 Yes    DOI/DOS/ Date: 05/20/23  Improved

## 2023-06-10 ENCOUNTER — Ambulatory Visit (HOSPITAL_COMMUNITY): Payer: Self-pay | Admitting: Occupational Therapy

## 2023-06-10 ENCOUNTER — Ambulatory Visit (HOSPITAL_COMMUNITY): Payer: 59

## 2023-06-10 ENCOUNTER — Encounter (HOSPITAL_COMMUNITY): Payer: Self-pay

## 2023-06-10 ENCOUNTER — Encounter (HOSPITAL_COMMUNITY): Payer: Self-pay | Admitting: Occupational Therapy

## 2023-06-10 DIAGNOSIS — M79644 Pain in right finger(s): Secondary | ICD-10-CM

## 2023-06-10 DIAGNOSIS — R29898 Other symptoms and signs involving the musculoskeletal system: Secondary | ICD-10-CM

## 2023-06-10 DIAGNOSIS — M25612 Stiffness of left shoulder, not elsewhere classified: Secondary | ICD-10-CM | POA: Diagnosis not present

## 2023-06-10 DIAGNOSIS — Z96612 Presence of left artificial shoulder joint: Secondary | ICD-10-CM

## 2023-06-10 DIAGNOSIS — M6281 Muscle weakness (generalized): Secondary | ICD-10-CM | POA: Diagnosis not present

## 2023-06-10 DIAGNOSIS — M25512 Pain in left shoulder: Secondary | ICD-10-CM | POA: Diagnosis not present

## 2023-06-10 NOTE — Therapy (Unsigned)
 OUTPATIENT OCCUPATIONAL THERAPY ORTHO TREATMENT NOTE  Patient Name: Leah Lynch MRN: [id number] DOB:1945-09-24, 78 y.o., female Today's Date: 06/12/2023   END OF SESSION:   06/10/23 1520  OT Visits / Re-Eval  Visit Number 8  Number of Visits 10  Date for OT Re-Evaluation 06/26/23  Authorization  Authorization Type 1) [employer] Medicare Dual Complete  2) [place] Medicaid  Authorization Time Period no visit limit  Progress Note Due on Visit 10  OT Time Calculation  OT Start Time 1351  OT Stop Time 1520  OT Time Calculation (min) 89 min  End of Session  Activity Tolerance Patient tolerated treatment well  Behavior During Therapy WFL for tasks assessed/performed    Past Medical History:  Diagnosis Date   Fibrocystic breast    GERD (gastroesophageal reflux disease)    Hyperlipidemia    Hypertension    Osteoarthritis    Osteoporosis    PONV (postoperative nausea and vomiting)    Pre-diabetes    Prediabetes    Shortness of breath dyspnea    Past Surgical History:  Procedure Laterality Date   ABDOMINAL HYSTERECTOMY     BIOPSY  04/15/2018   Procedure: BIOPSY;  Surgeon: Corbin Ade, MD;  [facility];  Service: Endoscopy;;  colon   BREAST CYST EXCISION Right    BREAST SURGERY     removal of cyst from right breast-fibrocystsic   BUNIONECTOMY  12/19/2010   Procedure: BUNIONECTOMY;  Surgeon: Dallas Schimke;  Location: [facility];  Service: Orthopedics;  Laterality: Right;  [name], [name] Bunionectomy   BUNIONECTOMY  05/22/2011   Procedure: Arbutus Leas;  Surgeon: Dallas Schimke, DPM;  Location: [facility];  Service: Orthopedics;  Laterality: Left;  [name] Bunionectomy Left Foot   CAPSULOTOMY  01/29/2012   Procedure: CAPSULOTOMY;  Surgeon: Dallas Schimke, DPM;  Location: [facility];  Service: Orthopedics;  Laterality: Left;   CATARACT EXTRACTION W/PHACO Right 08/09/2015   Procedure: CATARACT EXTRACTION PHACO AND INTRAOCULAR LENS PLACEMENT  RIGHT EYE CDE=7.95;  Surgeon: Gemma Payor, MD;  Location: [facility];  Service: Ophthalmology;  Laterality: Right;   CATARACT EXTRACTION W/PHACO Left 08/27/2015   Procedure: CATARACT EXTRACTION PHACO AND INTRAOCULAR LENS PLACEMENT LEFT EYE; CDE:  6.12;  Surgeon: Gemma Payor, MD;  Location: [facility];  Service: Ophthalmology;  Laterality: Left;   COLONOSCOPY  2012   Dr. Jena Gauss: internal hemorrhoids, hyperplastic polyps. Surveillance 2022   COLONOSCOPY WITH PROPOFOL N/A 04/15/2018   one 7 mm polyp at splenic flexure, one 4 mm polyp in rectum, otherwise normal. S/p segmental biopsy:normal. Normal TI   DIAGNOSTIC LAPAROSCOPY     ESOPHAGOGASTRODUODENOSCOPY (EGD) WITH PROPOFOL N/A 04/15/2018   normal   FOOT GANGLION EXCISION     right foot   METATARSAL OSTEOTOMY  05/22/2011   Procedure: METATARSAL OSTEOTOMY;  Surgeon: Dallas Schimke, DPM;  [facility]: [facility];  Service: Orthopedics;  Laterality: Left;  [name] Osteotomy Left Foot   POLYPECTOMY  04/15/2018   Procedure: POLYPECTOMY;  Surgeon: Corbin Ade, MD;  [facility];  Service: Endoscopy;;  colon   REVERSE SHOULDER ARTHROPLASTY Left 09/08/2022   Procedure: LEFT REVERSE SHOULDER ARTHROPLASTY;  Surgeon: Oliver Barre, MD;  Location: [facility];  Service: Orthopedics;  Laterality: Left;  RNFA NEEDED   TONSILLECTOMY  age 37   TRIGGER FINGER RELEASE Right 01/28/2018   Procedure: RIGHT TRIGGER THUMB RELEASE;  Surgeon: Vickki Hearing, MD;  Location: [facility];  Service: Orthopedics;  Laterality: Right;   TRIGGER FINGER RELEASE Right  02/17/2023   Procedure: RELEASE TRIGGER FINGER/A-1 PULLEY;  Surgeon: Vickki Hearing, MD;  Location: [facility];  Service: Orthopedics;  Laterality: Right;   TRIGGER FINGER RELEASE Right 05/20/2023   Procedure: RELEASE TRIGGER FINGER/A-1 PULLEY RIGHT MIDDLE;  Surgeon: Vickki Hearing, MD;  Location: [facility];  Service: Orthopedics;  Laterality: Right;   TUBAL LIGATION     Patient Active Problem List   Diagnosis  Date Noted   Breast cancer screening by mammogram 05/29/2023   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 05/21/2023   Need for pneumococcal 20-valent conjugate vaccination 02/19/2023   Trigger finger, right middle finger 02/17/2023   Need for influenza vaccination 11/18/2022   Controlled type 2 diabetes mellitus without complication, without long-term current use of insulin (HCC) 09/30/2022   Rotator cuff arthropathy of left shoulder 09/08/2022   Essential hypertension 09/03/2022   GERD (gastroesophageal reflux disease) 09/03/2022   Hyperlipidemia 09/03/2022   Osteoporosis 09/03/2022   Chronic musculoskeletal pain 09/03/2022   Rotator cuff arthropathy, left 09/03/2022   Screening for lung cancer 09/03/2022   Early satiety 02/22/2018   S/P trigger finger release right thumb 11/141/9 02/08/2018   Nausea without vomiting 11/12/2016   PCP: Dr. Christel Mormon REFERRING PROVIDER: Dr. Fuller Canada  ONSET DATE: 02/17/23  REFERRING DIAG: Z61.096 (ICD-10-CM) - Trigger finger, right middle finger   THERAPY DIAG:  Pain in right finger(s)  Other symptoms and signs involving the musculoskeletal system  Rationale for Evaluation and Treatment: Rehabilitation  SUBJECTIVE:   SUBJECTIVE STATEMENT: S: "The doctor wants the splint lifting my finger up more."  PERTINENT HISTORY: Pt is a 78 y/o female s/p right 3rd digit trigger finger release on 02/17/23.  Pt reports extension lag and inability to achieve full flexion.   PRECAUTIONS: Other: ROM and progress as tolerated  WEIGHT BEARING RESTRICTIONS: No  PAIN:  Are you having pain? No  FALLS: Has patient fallen in last 6 months? Yes. Number of falls 1  PLOF: Independent  PATIENT GOALS: To be able to use her right hand functionally.   NEXT MD VISIT: 05/15/23  OBJECTIVE:  Note: Objective measures were completed at Evaluation unless otherwise noted.  HAND DOMINANCE: Right  ADLs: Pt reports difficulty gripping and  holding objects. Cannot fully extend or flex her middle finger. Pt has difficulty opening jar lids or caps. Pt has pain at incision site when holding objects or pushing on things.   FUNCTIONAL OUTCOME MEASURES: Quick Dash: 25 05/27/23: 56.82  UPPER EXTREMITY ROM:     Active ROM Right eval Right 05/27/23  Index MCP (0-90) 66   Index PIP (0-100) 58   Index DIP (0-70)  46   Long MCP (0-90)  72 80  Long PIP (0-100)  68 75  Long DIP (0-70)  42 50  Ring MCP (0-90) 76    Ring PIP (0-100)  90   Ring DIP (0-70)  46   Little MCP (0-90)  86   Little PIP (0-100)  78   Little DIP (0-70)  58   (Blank rows = not tested)   HAND FUNCTION: Grip strength: Right: 30 lbs; Left: 48 lbs, Lateral pinch: Right: 10 lbs, Left: 11 lbs, and 3 point pinch: Right: 8 lbs, Left: 10 lbs ** will reassess once stitches are out** Grip strength: Right:  lbs; Lateral pinch: Right:  lbs, and 3 point pinch: Right:  lbs  COORDINATION: 9 Hole Peg test: Right: 25.21" sec; Left: 25.86" sec  SENSATION: WFL  EDEMA:  Right MCPs  Left MCPs 7.25cm  7.25cm  COGNITION: Overall cognitive status: Within functional limits for tasks assessed  OBSERVATIONS: edema and scar tissue at incision site at volar base of 2nd and 3rd digit MCPs   TREATMENT DATE:   06/10/23 -Manual Therapy: myofascial release to palmar aspect of hand in order to reduce fascial restrictions and pain, as well as improve ROM -P/ROM: digit extension to flexion, abduction, x10 -Digit ROM: composite flexion, abduction, finger taps, opposition, x10 -Fabrication of digit extension splint - extending from tip of middle finger to the wrist on dorsal aspect of hand.  06/04/23 -Manual Therapy: myofascial release to palmar aspect of hand in order to reduce fascial restrictions and pain, as well as improve ROM -P/ROM: digit extension to flexion, abduction, x10 -Digit ROM: composite flexion, abduction, finger taps, opposition, x10 -Fabrication of digit  extension splint  05/27/23 -Re-Evaluation -Digit ROM: composite flexion, abduction, finger taps, opposition, x10 -Wrist ROM: flexion, extension, ulnar/radial deviation, supination/pronation, x10  05/13/23 -Manual Therapy: myofascial release to palmar aspect of hand in order to reduce fascial restrictions and pain, as well as improve ROM -Composite digit flexion and extension, 10 reps -Finger taps: 10 reps-able to lift 3rd digit off the table into extension approximately 1 cm -Grip strengthening: large, medium, and small beads hand gripper at 25# resistance, vertical -Red theraputty: rolling into a ball, flatten, rolling, pinching-lateral and 3 point; 2 rounds -Pinch strengthening: pt using red and green clothespin and 3 point pinch to grasp and stack high resistance sponges, then using lateral pinch to replace sponges into the bucket. Pt completing 4 towers of 5 sponges-2 with red clothespin and 2 with green clothespin   PATIENT EDUCATION: Education details: Splint wear and care Person educated: Patient Education method: Explanation, Demonstration, and Handouts Education comprehension: verbalized understanding and returned demonstration  HOME EXERCISE PROGRAM: Eval: finger A/ROM 2/5: Towel scrunches and wringing out 2/26: theraputty grip and pinch, red  GOALS: Goals reviewed with patient? Yes  SHORT TERM GOALS: Target date: 04/29/23  Pt will be provided with and educated on HEP to improve mobility in right hand required for use as dominant during ADL tasks.  Goal status: IN PROGRESS   LONG TERM GOALS: Target date: 05/27/23  Pt will increase A/ROM of right index and middle fingers by 10 degrees to improve ability to make a full fist required for grasping items during ADLs.   Goal status: IN PROGRESS  2.  Pt will decrease pain in right hand to 2/10 or less to improve ability to use the right hand during housekeeping tasks.  Goal status: IN PROGRESS  3.  Pt will decrease right  hand scar and fascial restrictions to minimal amounts to improve mobility in hand required for manipulating small objects.    Goal status: IN PROGRESS  4.  Pt will increase right hand grip strength by 15# and pinch strength by 3# to improve ability to grip and open jars and container lids.   Goal status: IN PROGRESS    ASSESSMENT:  CLINICAL IMPRESSION: This session pt continues to report that her finger remains "down" at the MCP. OT and pt worked on digit ROM both actively and passively to stretch out her finger into extension. With improved ROM and decreased tension OT completed a new splint for night time digit extension, following MD's request for max resting extension. Pt continuing therapy to follow up with OT and check on splint, as well as continuing to strengthen grip and pinch.   PERFORMANCE DEFICITS:  in functional skills including ADLs, IADLs, coordination, dexterity, ROM, strength, pain, fascial restrictions, Fine motor control, and UE functional use   PLAN:  OT FREQUENCY: 1x/week  OT DURATION: 6 weeks  PLANNED INTERVENTIONS: 97168 OT Re-evaluation, 97535 self care/ADL training, 95621 therapeutic exercise, 97530 therapeutic activity, 97140 manual therapy, 97035 ultrasound, 97018 paraffin, 30865 electrical stimulation unattended, patient/family education, and DME and/or AE instructions  CONSULTED AND AGREED WITH PLAN OF CARE: Patient  PLAN FOR NEXT SESSION: Follow up on HEP, manual techniques, A/ROM, gentle grip/pinch strengthening   Trish Mage, OTR/L [phone] 06/12/2023, 9:13 PM

## 2023-06-10 NOTE — Therapy (Signed)
 OUTPATIENT PHYSICAL THERAPY TREATMENT Patient Name: Leah Lynch MRN: [id number] DOB:1946/02/04, 78 y.o., female Today's Date: 06/10/2023  PCP: Billie Lade, MD REFERRING PROVIDER: Oliver Barre, MD  END OF SESSION:   PT End of Session - 06/10/23 1257     Visit Number 7    Number of Visits 8    Date for PT Re-Evaluation 06/15/23    Authorization Type [employer] Dual complete    Authorization Time Period no auth; no limit    Progress Note Due on Visit 8    PT Start Time 1301    PT Stop Time 1342    PT Time Calculation (min) 41 min    Activity Tolerance Patient tolerated treatment well    Behavior During Therapy WFL for tasks assessed/performed                Past Medical History:  Diagnosis Date   Fibrocystic breast    GERD (gastroesophageal reflux disease)    Hyperlipidemia    Hypertension    Osteoarthritis    Osteoporosis    PONV (postoperative nausea and vomiting)    Pre-diabetes    Prediabetes    Shortness of breath dyspnea    Past Surgical History:  Procedure Laterality Date   ABDOMINAL HYSTERECTOMY     BIOPSY  04/15/2018   Procedure: BIOPSY;  Surgeon: Corbin Ade, MD;  [facility];  Service: Endoscopy;;  colon   BREAST CYST EXCISION Right    BREAST SURGERY     removal of cyst from right breast-fibrocystsic   BUNIONECTOMY  12/19/2010   Procedure: BUNIONECTOMY;  Surgeon: Dallas Schimke;  Location: [facility];  Service: Orthopedics;  Laterality: Right;  [name], [name] Bunionectomy   BUNIONECTOMY  05/22/2011   Procedure: Arbutus Leas;  Surgeon: Dallas Schimke, DPM;  Location: [facility];  Service: Orthopedics;  Laterality: Left;  [name] Bunionectomy Left Foot   CAPSULOTOMY  01/29/2012   Procedure: CAPSULOTOMY;  Surgeon: Dallas Schimke, DPM;  Location: [facility];  Service: Orthopedics;  Laterality: Left;   CATARACT EXTRACTION W/PHACO Right 08/09/2015   Procedure: CATARACT EXTRACTION PHACO AND INTRAOCULAR LENS  PLACEMENT RIGHT EYE CDE=7.95;  Surgeon: Gemma Payor, MD;  Location: [facility];  Service: Ophthalmology;  Laterality: Right;   CATARACT EXTRACTION W/PHACO Left 08/27/2015   Procedure: CATARACT EXTRACTION PHACO AND INTRAOCULAR LENS PLACEMENT LEFT EYE; CDE:  6.12;  Surgeon: Gemma Payor, MD;  Location: [facility];  Service: Ophthalmology;  Laterality: Left;   COLONOSCOPY  2012   Dr. Jena Gauss: internal hemorrhoids, hyperplastic polyps. Surveillance 2022   COLONOSCOPY WITH PROPOFOL N/A 04/15/2018   one 7 mm polyp at splenic flexure, one 4 mm polyp in rectum, otherwise normal. S/p segmental biopsy:normal. Normal TI   DIAGNOSTIC LAPAROSCOPY     ESOPHAGOGASTRODUODENOSCOPY (EGD) WITH PROPOFOL N/A 04/15/2018   normal   FOOT GANGLION EXCISION     right foot   METATARSAL OSTEOTOMY  05/22/2011   Procedure: METATARSAL OSTEOTOMY;  Surgeon: Dallas Schimke, DPM;  [facility]: [facility];  Service: Orthopedics;  Laterality: Left;  [name] Osteotomy Left Foot   POLYPECTOMY  04/15/2018   Procedure: POLYPECTOMY;  Surgeon: Corbin Ade, MD;  [facility];  Service: Endoscopy;;  colon   REVERSE SHOULDER ARTHROPLASTY Left 09/08/2022   Procedure: LEFT REVERSE SHOULDER ARTHROPLASTY;  Surgeon: Oliver Barre, MD;  Location: [facility];  Service: Orthopedics;  Laterality: Left;  RNFA NEEDED   TONSILLECTOMY  age 63   TRIGGER FINGER RELEASE Right 01/28/2018  Procedure: RIGHT TRIGGER THUMB RELEASE;  Surgeon: Vickki Hearing, MD;  [facility];  Service: Orthopedics;  Laterality: Right;   TRIGGER FINGER RELEASE Right 02/17/2023   Procedure: RELEASE TRIGGER FINGER/A-1 PULLEY;  Surgeon: Vickki Hearing, MD;  Location: [facility];  Service: Orthopedics;  Laterality: Right;   TRIGGER FINGER RELEASE Right 05/20/2023   Procedure: RELEASE TRIGGER FINGER/A-1 PULLEY RIGHT MIDDLE;  Surgeon: Vickki Hearing, MD;  Location: [facility];  Service: Orthopedics;  Laterality: Right;   TUBAL LIGATION     Patient Active Problem List    Diagnosis Date Noted   Breast cancer screening by mammogram 05/29/2023   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 05/21/2023   Need for pneumococcal 20-valent conjugate vaccination 02/19/2023   Trigger finger, right middle finger 02/17/2023   Need for influenza vaccination 11/18/2022   Controlled type 2 diabetes mellitus without complication, without long-term current use of insulin (HCC) 09/30/2022   Rotator cuff arthropathy of left shoulder 09/08/2022   Essential hypertension 09/03/2022   GERD (gastroesophageal reflux disease) 09/03/2022   Hyperlipidemia 09/03/2022   Osteoporosis 09/03/2022   Chronic musculoskeletal pain 09/03/2022   Rotator cuff arthropathy, left 09/03/2022   Screening for lung cancer 09/03/2022   Early satiety 02/22/2018   S/P trigger finger release right thumb 11/141/9 02/08/2018   Nausea without vomiting 11/12/2016    ONSET DATE: Billie Lade, MD  REFERRING DIAG: Oliver Barre, MD next apt: June 2025  THERAPY DIAG:  Muscle weakness (generalized)  Stiffness of left shoulder, not elsewhere classified  Acute pain of left shoulder  Status post reverse total arthroplasty of left shoulder  Rationale for Evaluation and Treatment: Rehabilitation  SUBJECTIVE:   SUBJECTIVE STATEMENT: Pt stated she is feeling good, no reports of pain today.    PERTINENT HISTORY:   Left reverse TSA on 09/08/2022  PRECAUTIONS: None  RED FLAGS: None   WEIGHT BEARING RESTRICTIONS: No  PAIN:  Are you having pain? Yes: NPRS scale: 2/10  Pain location: Left anterior-lateral shoulder region  Pain description: Sharp Aggravating factors: Overhead lifting Relieving factors: avoiding lifting  FALLS: Has patient fallen in last 6 months? Yes. Number of falls 1   PATIENT GOALS: "get it up a little bit higher with reduced pain"  OBJECTIVE:  Note: Objective measures were completed at Evaluation unless otherwise noted.  HAND DOMINANCE:  Right  ADLs: Overall ADLs: modified independent, limitations with hair brushing Transfers/ambulation related to ADLs: Independent Grooming: limited Upper body dressing: independent Toileting: independent Bathing: independent  FUNCTIONAL OUTCOME MEASURES: Quick Dash: 15.9/100= 15.9%  UPPER EXTREMITY ROM:     Active ROM Right eval Left eval Left 05/13/23 Left 05/27/23:  Shoulder flexion 160 105A; 125P 150 AAROM AROM: 112; AAROM: 150  Shoulder abduction 150 90A; 120P 125 AAROM AAROM 125  Shoulder adduction      Shoulder extension      Shoulder internal rotation      Shoulder external rotation 75 15A; 25P 50 AAROM AROM 28; AAROM 50  Elbow flexion      Elbow extension      Wrist flexion      Wrist extension      Wrist ulnar deviation      Wrist radial deviation      Wrist pronation      Wrist supination      (Blank rows = not tested)   UPPER EXTREMITY MMT:     MMT Right eval Left eval  Shoulder flexion 4- 2+  Shoulder abduction 4- 2+  Shoulder adduction    Shoulder extension    Shoulder internal rotation    Shoulder external rotation 3+ 2+  Middle trapezius 3+ 3+  Lower trapezius 3 2  Elbow flexion    Elbow extension    Wrist flexion    Wrist extension    Wrist ulnar deviation    Wrist radial deviation    Wrist pronation    Wrist supination    (Blank rows = not tested)   TREATMENT DATE:  06/10/23: Standing: Pulley flexion /abduction x 2 minutes each PNF 1 and 2 x 10 each  Wall arch 10x  Sitting: Ball forward flexion stretch x 10 Ball Lt abduction x 10  Ball around the [place] stretch x 5 clockwise/5 counter clock wids B UE flexion without ball x 5 B UE abduction without ball x 5 B ER x 10  Sidelying: ER 2x 10 Abduction 2x 10 PROM  06/03/23 Sitting: Pulley flexion /abduction x 2 minutes each. Sitting: Ball forward flexion stretch x 10 Ball Lt abduction x 10  Ball around the [place] stretch x 5 clockwise/5 counter clock wids B UE flexion without  ball x 5 B UE abduction without ball x 5 B ER x 10  PROM  PNF 1 and 2 x 10 each  Standing :  ladder walking x 5  06/02/23: Sidelying: Abduction 2x 10 ER 2x 10 Seated: Flexion Abduction Standing: Isometric ER 5x 5" Abduction 5x 5" Flexion 5x 5" Extensoin 5x 5" 10 cones placed on/off 2nd shelf  Manual PROM for Lt shoulder ROM, STM to anterior shoulder/pecs  PATIENT EDUCATION: Education details: PT Evaluation, findings, prognosis, frequency, attendance policy, and HEP if given.  Person educated: Patient Education method: Explanation, Demonstration, Tactile cues, and Verbal cues Education comprehension: verbalized understanding and returned demonstration  HOME EXERCISE PROGRAM: Access Code: [email or web address]  Seated Shoulder Flexion Full Range  - 2 x daily - 7 x weekly - 1 sets - 10 reps - Seated Shoulder Abduction Full Range  - 2 x daily - 7 x weekly - 1 sets - 10 reps - Seated Shoulder External Rotation  - 2 x daily - 7 x weekly - 1 sets - 10 reps URL: [email or web address] Date: 05/08/2023 Prepared by: Becky Sax  Exercises - Sidelying Shoulder External Rotation  - 1 x daily - 7 x weekly - 3 sets - 12-15 reps - Sidelying Shoulder Abduction Palm Forward  - 1 x daily - 7 x weekly - 3 sets - 12-15 reps - Sidelying Shoulder Flexion 15 Degrees  - 1 x daily - 7 x weekly - 3 sets - 12-15 reps  OT HEP includes wall slides, shoulder protraction/retractions, horizontal abduction, sidelying ER/abduction, towel stretch for IR, corner stretch, posterior capsule stretch, ER stretch, wand exercises, jumping jack, scapular retraction, Vslide with theraband, theraband: protract, horizontal abd, IR, ER, flexoin, abduction  GOALS: Goals reviewed with patient? Yes  SHORT TERM GOALS: Target date: 05/18/2023  Pt will be independent with HEP in order to demonstrate participation in Physical Therapy POC.  Baseline: Goal status: MET  2.  Pt will 4/10 pain during mobility in order  to demonstrate improved pain with functional activities.  Baseline:  Goal status: MET  LONG TERM GOALS: Target date: 06/15/2023  Pt will improve Shoulder flexion MMT by >1/2 grade in order to demonstrate improved functional strength to return to desired activities.  Baseline: See objective.  Goal status: MET  2.  Pt will improve Shoulder Abduction MMT by >1/2  grade in order to demonstrate improved functional ambulatory capacity in community setting.  Baseline: See objective.  Goal status: MET  3.  Pt will improve QuickDASH score by 3 points in order to demonstrate improved pain with functional goals and outcomes. Baseline: See objective.  Goal status: IN PROGRESS  4.  Pt will improve LUE AROM by 10 degrees in order to improve capacity during functional overhead activities.. Baseline: See objective.  Goal status: IN PROGRESS  5.  Pt will 2/10 pain during mobility in order to demonstrate improved pain with functional activities.  Baseline:  Goal status: IN PROGRESS  ASSESSMENT:  CLINICAL IMPRESSION: Continued session focus with shoulder mobility and strength.  Pt continues to exhibit limited ROM and weakness, noted with wall arch exercises presents with increased fatigue with decreased range due to weakness.  Pt tolerated well to session.  Pt to be reassessed next session.  Evaluation: Pt is demonstrating moderate ADL limitations, overhead reaching limitations due to muscle weakness and ROM in left shoulder. Prognosis in progression due to timeline from surgery on date in 08/2022. [name] Pt will benefit from skilled Physical Therapy services to address deficits/limitations in order to improve functional and QOL.    PERFORMANCE DEFICITS: in functional skills including ADLs, ROM, strength, and pain,.   IMPAIRMENTS: are limiting patient from ADLs and education.   COMORBIDITIES: has no other co-morbidities that affects occupational performance. Patient will benefit from skilled OT to address  above impairments and improve overall function.  MODIFICATION OR ASSISTANCE TO COMPLETE EVALUATION: No modification of tasks or assist necessary to complete an evaluation.  OT OCCUPATIONAL PROFILE AND HISTORY: Problem focused assessment: Including review of records relating to presenting problem.  CLINICAL DECISION MAKING: LOW - limited treatment options, no task modification necessary  REHAB POTENTIAL: Fair > 6 months from surgery of total reverse shoulder arthroplasty  EVALUATION COMPLEXITY: Low   PLAN:  PT FREQUENCY: 1x/week  PT DURATION: 8 weeks  PLANNED INTERVENTIONS: 97535 self care/ADL training, 95621 therapeutic exercise, 97530 therapeutic activity, 97112 neuromuscular re-education, 97140 manual therapy, [id number] electrical stimulation (manual), 97014 electrical stimulation unattended, 97102 mechanical traction, passive range of motion, and 97164 PT-Re-evaluation    CONSULTED AND AGREED WITH PLAN OF CARE: Patient  PLAN FOR NEXT SESSION: isolated L shoulder flexion/abduction and ER strengthening.  Establish strong HEP.  Reassess next session  Becky Sax, LPTA/CLT; [place] [phone]  06/10/2023

## 2023-06-16 ENCOUNTER — Other Ambulatory Visit: Payer: Self-pay

## 2023-06-16 ENCOUNTER — Ambulatory Visit (HOSPITAL_COMMUNITY): Attending: Orthopedic Surgery

## 2023-06-16 ENCOUNTER — Encounter (HOSPITAL_COMMUNITY): Payer: Self-pay

## 2023-06-16 DIAGNOSIS — M6281 Muscle weakness (generalized): Secondary | ICD-10-CM | POA: Insufficient documentation

## 2023-06-16 DIAGNOSIS — M79644 Pain in right finger(s): Secondary | ICD-10-CM | POA: Insufficient documentation

## 2023-06-16 DIAGNOSIS — R29898 Other symptoms and signs involving the musculoskeletal system: Secondary | ICD-10-CM | POA: Diagnosis not present

## 2023-06-16 DIAGNOSIS — M25612 Stiffness of left shoulder, not elsewhere classified: Secondary | ICD-10-CM | POA: Insufficient documentation

## 2023-06-16 MED ORDER — ONDANSETRON HCL 8 MG PO TABS: 8.0000 mg | ORAL_TABLET | Freq: Three times a day (TID) | ORAL | 0 refills | Status: DC | PRN

## 2023-06-16 NOTE — Therapy (Signed)
 OUTPATIENT PHYSICAL THERAPY PROGRESS NOTE/DISCHARGE/ TREATMENT Progress Note Reporting Period 04/20/23 to 06/15/23  See note below for Objective Data and Assessment of Progress/Goals.  PHYSICAL THERAPY DISCHARGE SUMMARY  Visits from Start of Care: 8  Current functional level related to goals / functional outcomes: See below   Remaining deficits: See below   Education / Equipment: Advanced HEP   Patient agrees to discharge. Patient goals were partially met. Patient is being discharged due to being pleased with the current functional level.   Patient Name: Leah Lynch MRN: [id number] DOB:[birth year], 78 y.o., female Today's Date: 06/16/2023  PCP: Billie Lade, MD REFERRING PROVIDER: Oliver Barre, MD  END OF SESSION:   PT End of Session - 06/16/23 0918     Visit Number 8    Number of Visits 8    Date for PT Re-Evaluation 06/15/23    Authorization Type [employer] Dual complete    Authorization Time Period no auth; no limit    Progress Note Due on Visit 8    PT Start Time 0846    PT Stop Time 0918    PT Time Calculation (min) 32 min    Activity Tolerance Patient tolerated treatment well    Behavior During Therapy WFL for tasks assessed/performed                 Past Medical History:  Diagnosis Date   Fibrocystic breast    GERD (gastroesophageal reflux disease)    Hyperlipidemia    Hypertension    Osteoarthritis    Osteoporosis    PONV (postoperative nausea and vomiting)    Pre-diabetes    Prediabetes    Shortness of breath dyspnea    Past Surgical History:  Procedure Laterality Date   ABDOMINAL HYSTERECTOMY     BIOPSY  04/15/2018   Procedure: BIOPSY;  Surgeon: Corbin Ade, MD;  [facility];  Service: Endoscopy;;  colon   BREAST CYST EXCISION Right    BREAST SURGERY     removal of cyst from right breast-fibrocystsic   BUNIONECTOMY  12/19/2010   Procedure: BUNIONECTOMY;  Surgeon: Dallas Schimke;  Location: [facility];  Service:  Orthopedics;  Laterality: Right;  [name], [name] Bunionectomy   BUNIONECTOMY  05/22/2011   Procedure: Arbutus Leas;  Surgeon: Dallas Schimke, DPM;  Location: [facility];  Service: Orthopedics;  Laterality: Left;  [name] Bunionectomy Left Foot   CAPSULOTOMY  01/29/2012   Procedure: CAPSULOTOMY;  Surgeon: Dallas Schimke, DPM;  Location: [facility];  Service: Orthopedics;  Laterality: Left;   CATARACT EXTRACTION W/PHACO Right 08/09/2015   Procedure: CATARACT EXTRACTION PHACO AND INTRAOCULAR LENS PLACEMENT RIGHT EYE CDE=7.95;  Surgeon: Gemma Payor, MD;  Location: [facility];  Service: Ophthalmology;  Laterality: Right;   CATARACT EXTRACTION W/PHACO Left 08/27/2015   Procedure: CATARACT EXTRACTION PHACO AND INTRAOCULAR LENS PLACEMENT LEFT EYE; CDE:  6.12;  Surgeon: Gemma Payor, MD;  Location: [facility];  Service: Ophthalmology;  Laterality: Left;   COLONOSCOPY  2012   Dr. Jena Gauss: internal hemorrhoids, hyperplastic polyps. Surveillance 2022   COLONOSCOPY WITH PROPOFOL N/A 04/15/2018   one 7 mm polyp at splenic flexure, one 4 mm polyp in rectum, otherwise normal. S/p segmental biopsy:normal. Normal TI   DIAGNOSTIC LAPAROSCOPY     ESOPHAGOGASTRODUODENOSCOPY (EGD) WITH PROPOFOL N/A 04/15/2018   normal   FOOT GANGLION EXCISION     right foot   METATARSAL OSTEOTOMY  05/22/2011   Procedure: METATARSAL OSTEOTOMY;  Surgeon: Dallas Schimke, DPM;  Location: [facility];  Service: Orthopedics;  Laterality: Left;  [name] Osteotomy Left Foot   POLYPECTOMY  04/15/2018   Procedure: POLYPECTOMY;  Surgeon: Corbin Ade, MD;  [facility];  Service: Endoscopy;;  colon   REVERSE SHOULDER ARTHROPLASTY Left 09/08/2022   Procedure: LEFT REVERSE SHOULDER ARTHROPLASTY;  Surgeon: Oliver Barre, MD;  Location: [facility];  Service: Orthopedics;  Laterality: Left;  RNFA NEEDED   TONSILLECTOMY  age 25   TRIGGER FINGER RELEASE Right 01/28/2018   Procedure: RIGHT TRIGGER THUMB RELEASE;  Surgeon:  Vickki Hearing, MD;  Location: [facility];  Service: Orthopedics;  Laterality: Right;   TRIGGER FINGER RELEASE Right 02/17/2023   Procedure: RELEASE TRIGGER FINGER/A-1 PULLEY;  Surgeon: Vickki Hearing, MD;  Location: [facility];  Service: Orthopedics;  Laterality: Right;   TRIGGER FINGER RELEASE Right 05/20/2023   Procedure: RELEASE TRIGGER FINGER/A-1 PULLEY RIGHT MIDDLE;  Surgeon: Vickki Hearing, MD;  Location: [facility];  Service: Orthopedics;  Laterality: Right;   TUBAL LIGATION     Patient Active Problem List   Diagnosis Date Noted   Breast cancer screening by mammogram 05/29/2023   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 05/21/2023   Need for pneumococcal 20-valent conjugate vaccination 02/19/2023   Trigger finger, right middle finger 02/17/2023   Need for influenza vaccination 11/18/2022   Controlled type 2 diabetes mellitus without complication, without long-term current use of insulin (HCC) 09/30/2022   Rotator cuff arthropathy of left shoulder 09/08/2022   Essential hypertension 09/03/2022   GERD (gastroesophageal reflux disease) 09/03/2022   Hyperlipidemia 09/03/2022   Osteoporosis 09/03/2022   Chronic musculoskeletal pain 09/03/2022   Rotator cuff arthropathy, left 09/03/2022   Screening for lung cancer 09/03/2022   Early satiety 02/22/2018   S/P trigger finger release right thumb 11/141/9 02/08/2018   Nausea without vomiting 11/12/2016    ONSET DATE: Billie Lade, MD  REFERRING DIAG: Oliver Barre, MD next apt: June 2025  THERAPY DIAG:  Muscle weakness (generalized)  Stiffness of left shoulder, not elsewhere classified  Rationale for Evaluation and Treatment: Rehabilitation  SUBJECTIVE:   SUBJECTIVE STATEMENT: Pt reports no resting pain but continues to have pain with elevation. Pt reports she feels that PT has definitely helped since evaluation. [name]  PERTINENT HISTORY:   Left reverse TSA on 09/08/2022  PRECAUTIONS: None  RED  FLAGS: None   WEIGHT BEARING RESTRICTIONS: No  PAIN:  Are you having pain? Yes: NPRS scale: 2/10  Pain location: Left anterior-lateral shoulder region  Pain description: Sharp Aggravating factors: Overhead lifting Relieving factors: avoiding lifting  FALLS: Has patient fallen in last 6 months? Yes. Number of falls 1   PATIENT GOALS: "get it up a little bit higher with reduced pain"  OBJECTIVE:  Note: Objective measures were completed at Evaluation unless otherwise noted.  HAND DOMINANCE: Right  ADLs: Overall ADLs: modified independent, limitations with hair brushing Transfers/ambulation related to ADLs: Independent Grooming: limited Upper body dressing: independent Toileting: independent Bathing: independent  FUNCTIONAL OUTCOME MEASURES: Quick Dash: 15.9/100= 15.9% Quick DASH: 13.6 / 100 = 13.6% UPPER EXTREMITY ROM:     Active ROM Right eval Left eval Left 05/13/23 Left 05/27/23: Right 06/16/23 Left 06/16/23  Shoulder flexion 160 105A; 125P 150 AAROM AROM: 112; AAROM: 150 180 130 A  Shoulder abduction 150 90A; 120P 125 AAROM AAROM 125 160 130 A  Shoulder adduction        Shoulder extension        Shoulder internal  rotation        Shoulder external rotation 75 15A; 25P 50 AAROM AROM 28; AAROM 50 80 55 AAROM  Elbow flexion        Elbow extension        Wrist flexion        Wrist extension        Wrist ulnar deviation        Wrist radial deviation        Wrist pronation        Wrist supination        (Blank rows = not tested)   UPPER EXTREMITY MMT:     MMT Right eval Left eval Right  06/16/23 Left  06/16/23  Shoulder flexion 4- 2+ 4 3-  Shoulder abduction 4- 2+ 4 3-  Shoulder adduction      Shoulder extension      Shoulder internal rotation      Shoulder external rotation 3+ 2+ 4- 3-  Middle trapezius 3+ 3+    Lower trapezius 3 2    Elbow flexion      Elbow extension      Wrist flexion      Wrist extension      Wrist ulnar deviation      Wrist radial  deviation      Wrist pronation      Wrist supination      (Blank rows = not tested)   TREATMENT DATE:  06/16/2023  -QuickDASH -ROM -MMT -Sidelying ER with towel on slide w/ 1lb weight x 12 -Sidelying shoulder flexion with 1lb weight x 12 -Sidelying D2 flexion pattern with hand over hand cuing and provided guided resistance 2 x 12 -Prone scapular retraction with shoulder horizontal abduction x 5 with 1lb and active assive to full range with eccentric control -Prone shoulder perturbations x 1'  06/10/23: Standing: Pulley flexion /abduction x 2 minutes each PNF 1 and 2 x 10 each  Wall arch 10x  Sitting: Ball forward flexion stretch x 10 Ball Lt abduction x 10  Ball around the world stretch x 5 clockwise/5 counter clock wids B UE flexion without ball x 5 B UE abduction without ball x 5 B ER x 10  Sidelying: ER 2x 10 Abduction 2x 10 PROM  06/03/23 Sitting: Pulley flexion /abduction x 2 minutes each. Sitting: Ball forward flexion stretch x 10 Ball Lt abduction x 10  Ball around the [place] stretch x 5 clockwise/5 counter clock wids B UE flexion without ball x 5 B UE abduction without ball x 5 B ER x 10  PROM  PNF 1 and 2 x 10 each  Standing :  ladder walking x 5    PATIENT EDUCATION: Education details: PT Evaluation, findings, prognosis, frequency, attendance policy, and HEP if given.  Person educated: Patient Education method: Explanation, Demonstration, Tactile cues, and Verbal cues Education comprehension: verbalized understanding and returned demonstration  HOME EXERCISE PROGRAM: Access Code: [email or web address]  Seated Shoulder Flexion Full Range  - 2 x daily - 7 x weekly - 1 sets - 10 reps - Seated Shoulder Abduction Full Range  - 2 x daily - 7 x weekly - 1 sets - 10 reps - Seated Shoulder External Rotation  - 2 x daily - 7 x weekly - 1 sets - 10 reps URL: [email or web address] Date: 05/08/2023 Prepared by: Becky Sax  Exercises - Sidelying Shoulder  External Rotation  - 1 x daily - 7 x weekly - 3 sets - 12-15 reps - Sidelying Shoulder  Abduction Palm Forward  - 1 x daily - 7 x weekly - 3 sets - 12-15 reps - Sidelying Shoulder Flexion 15 Degrees  - 1 x daily - 7 x weekly - 3 sets - 12-15 reps  OT HEP includes wall slides, shoulder protraction/retractions, horizontal abduction, sidelying ER/abduction, towel stretch for IR, corner stretch, posterior capsule stretch, ER stretch, wand exercises, jumping jack, scapular retraction, Vslide with theraband, theraband: protract, horizontal abd, IR, ER, flexoin, abduction  GOALS: Goals reviewed with patient? Yes  SHORT TERM GOALS: Target date: 05/18/2023  Pt will be independent with HEP in order to demonstrate participation in Physical Therapy POC.  Baseline: Goal status: MET  2.  Pt will 4/10 pain during mobility in order to demonstrate improved pain with functional activities.  Baseline:  Goal status: MET  LONG TERM GOALS: Target date: 06/15/2023  Pt will improve Shoulder flexion MMT by >1/2 grade in order to demonstrate improved functional strength to return to desired activities.  Baseline: See objective.  Goal status: MET  2.  Pt will improve Shoulder Abduction MMT by >1/2 grade in order to demonstrate improved functional ambulatory capacity in community setting.  Baseline: See objective.  Goal status: MET  3.  Pt will improve QuickDASH score by 3 points in order to demonstrate improved pain with functional goals and outcomes. Baseline: See objective.  Goal status: NOT MET  4.  Pt will improve LUE AROM by 10 degrees in order to improve capacity during functional overhead activities.. Baseline: See objective.  Goal status: NOT MET  5.  Pt will 2/10 pain during mobility in order to demonstrate improved pain with functional activities.  Baseline:  Goal status: MET  ASSESSMENT:  CLINICAL IMPRESSION: Progress note 06/16/2023: Pt tolerating session well. MMT and ROM examined with little  but positive changes occurring. Pt's QuickDASH improving by little as well. Pt's LUE is most likely hitting maximum amount of mobility. Pt reporting she is able to complete all ADLs she needs to and is comfortable discharging to HEP. Pt being discharged from skilled PT services at this time.   PERFORMANCE DEFICITS: in functional skills including ADLs, ROM, strength, and pain,.   IMPAIRMENTS: are limiting patient from ADLs and education.   COMORBIDITIES: has no other co-morbidities that affects occupational performance. Patient will benefit from skilled OT to address above impairments and improve overall function.  MODIFICATION OR ASSISTANCE TO COMPLETE EVALUATION: No modification of tasks or assist necessary to complete an evaluation.  OT OCCUPATIONAL PROFILE AND HISTORY: Problem focused assessment: Including review of records relating to presenting problem.  CLINICAL DECISION MAKING: LOW - limited treatment options, no task modification necessary  REHAB POTENTIAL: Fair > 6 months from surgery of total reverse shoulder arthroplasty  EVALUATION COMPLEXITY: Low   PLAN:  PT FREQUENCY: 1x/week  PT DURATION: 8 weeks  PLANNED INTERVENTIONS: 97535 self care/ADL training, 65784 therapeutic exercise, 97530 therapeutic activity, 97112 neuromuscular re-education, 97140 manual therapy, [id number] electrical stimulation (manual), 97014 electrical stimulation unattended, 97102 mechanical traction, passive range of motion, and 97164 PT-Re-evaluation    CONSULTED AND AGREED WITH PLAN OF CARE: Patient  PLAN FOR NEXT SESSION: isolated L shoulder flexion/abduction and ER strengthening.  Establish strong HEP.  Reassess next session  Nelida Meuse PT, DPT [facility] [place] [phone] office  06/16/2023

## 2023-06-17 ENCOUNTER — Encounter (HOSPITAL_COMMUNITY): Payer: Self-pay | Admitting: Occupational Therapy

## 2023-06-18 ENCOUNTER — Encounter: Payer: Self-pay | Admitting: Internal Medicine

## 2023-06-19 ENCOUNTER — Ambulatory Visit (HOSPITAL_COMMUNITY): Payer: Self-pay | Admitting: Occupational Therapy

## 2023-06-19 ENCOUNTER — Encounter (HOSPITAL_COMMUNITY): Payer: Self-pay | Admitting: Occupational Therapy

## 2023-06-19 DIAGNOSIS — M79644 Pain in right finger(s): Secondary | ICD-10-CM

## 2023-06-19 DIAGNOSIS — R29898 Other symptoms and signs involving the musculoskeletal system: Secondary | ICD-10-CM

## 2023-06-19 DIAGNOSIS — M25612 Stiffness of left shoulder, not elsewhere classified: Secondary | ICD-10-CM | POA: Diagnosis not present

## 2023-06-19 DIAGNOSIS — M6281 Muscle weakness (generalized): Secondary | ICD-10-CM | POA: Diagnosis not present

## 2023-06-19 NOTE — Therapy (Signed)
 OUTPATIENT OCCUPATIONAL THERAPY ORTHO TREATMENT NOTE  Patient Name: Leah Lynch MRN: [id number] DOB:02-27-1946, 79 y.o., female Today's Date: 06/19/2023   END OF SESSION:  OT End of Session - 06/19/23 1002     Visit Number 9    Number of Visits 10    Date for OT Re-Evaluation 06/26/23    Authorization Type 1) [employer] Medicare Dual Complete  2) [place] Medicaid    Authorization Time Period no visit limit    Progress Note Due on Visit 10    OT Start Time 0930    OT Stop Time 1010    OT Time Calculation (min) 40 min    Activity Tolerance Patient tolerated treatment well    Behavior During Therapy WFL for tasks assessed/performed            Past Medical History:  Diagnosis Date   Fibrocystic breast    GERD (gastroesophageal reflux disease)    Hyperlipidemia    Hypertension    Osteoarthritis    Osteoporosis    PONV (postoperative nausea and vomiting)    Pre-diabetes    Prediabetes    Shortness of breath dyspnea    Past Surgical History:  Procedure Laterality Date   ABDOMINAL HYSTERECTOMY     BIOPSY  04/15/2018   Procedure: BIOPSY;  Surgeon: Corbin Ade, MD;  [facility];  Service: Endoscopy;;  colon   BREAST CYST EXCISION Right    BREAST SURGERY     removal of cyst from right breast-fibrocystsic   BUNIONECTOMY  12/19/2010   Procedure: BUNIONECTOMY;  Surgeon: Dallas Schimke;  [facility]: [facility];  Service: Orthopedics;  Laterality: Right;  [name], [name] Bunionectomy   BUNIONECTOMY  05/22/2011   Procedure: Arbutus Leas;  Surgeon: Dallas Schimke, DPM;  Location: [facility];  Service: Orthopedics;  Laterality: Left;  [name] Bunionectomy Left Foot   CAPSULOTOMY  01/29/2012   Procedure: CAPSULOTOMY;  Surgeon: Dallas Schimke, DPM;  [facility];  Service: Orthopedics;  Laterality: Left;   CATARACT EXTRACTION W/PHACO Right 08/09/2015   Procedure: CATARACT EXTRACTION PHACO AND INTRAOCULAR LENS PLACEMENT RIGHT EYE CDE=7.95;   Surgeon: Gemma Payor, MD;  [facility]: [facility];  Service: Ophthalmology;  Laterality: Right;   CATARACT EXTRACTION W/PHACO Left 08/27/2015   Procedure: CATARACT EXTRACTION PHACO AND INTRAOCULAR LENS PLACEMENT LEFT EYE; CDE:  6.12;  Surgeon: Gemma Payor, MD;  [facility]: [facility];  Service: Ophthalmology;  Laterality: Left;   COLONOSCOPY  2012   Dr. Jena Gauss: internal hemorrhoids, hyperplastic polyps. Surveillance 2022   COLONOSCOPY WITH PROPOFOL N/A 04/15/2018   one 7 mm polyp at splenic flexure, one 4 mm polyp in rectum, otherwise normal. S/p segmental biopsy:normal. Normal TI   DIAGNOSTIC LAPAROSCOPY     ESOPHAGOGASTRODUODENOSCOPY (EGD) WITH PROPOFOL N/A 04/15/2018   normal   FOOT GANGLION EXCISION     right foot   METATARSAL OSTEOTOMY  05/22/2011   Procedure: METATARSAL OSTEOTOMY;  Surgeon: Dallas Schimke, DPM;  [facility]: [facility];  Service: Orthopedics;  Laterality: Left;  [name] Osteotomy Left Foot   POLYPECTOMY  04/15/2018   Procedure: POLYPECTOMY;  Surgeon: Corbin Ade, MD;  [facility];  Service: Endoscopy;;  colon   REVERSE SHOULDER ARTHROPLASTY Left 09/08/2022   Procedure: LEFT REVERSE SHOULDER ARTHROPLASTY;  Surgeon: Oliver Barre, MD;  Location: [facility];  Service: Orthopedics;  Laterality: Left;  RNFA NEEDED   TONSILLECTOMY  age 56   TRIGGER FINGER RELEASE Right 01/28/2018   Procedure: RIGHT TRIGGER THUMB RELEASE;  Surgeon:  Vickki Hearing, MD;  [facility]: [facility];  Service: Orthopedics;  Laterality: Right;   TRIGGER FINGER RELEASE Right 02/17/2023   Procedure: RELEASE TRIGGER FINGER/A-1 PULLEY;  Surgeon: Vickki Hearing, MD;  [facility];  Service: Orthopedics;  Laterality: Right;   TRIGGER FINGER RELEASE Right 05/20/2023   Procedure: RELEASE TRIGGER FINGER/A-1 PULLEY RIGHT MIDDLE;  Surgeon: Vickki Hearing, MD;  Location: [facility];  Service: Orthopedics;  Laterality: Right;   TUBAL LIGATION     Patient Active Problem List   Diagnosis Date Noted   Breast  cancer screening by mammogram 05/29/2023   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 05/21/2023   Need for pneumococcal 20-valent conjugate vaccination 02/19/2023   Trigger finger, right middle finger 02/17/2023   Need for influenza vaccination 11/18/2022   Controlled type 2 diabetes mellitus without complication, without long-term current use of insulin (HCC) 09/30/2022   Rotator cuff arthropathy of left shoulder 09/08/2022   Essential hypertension 09/03/2022   GERD (gastroesophageal reflux disease) 09/03/2022   Hyperlipidemia 09/03/2022   Osteoporosis 09/03/2022   Chronic musculoskeletal pain 09/03/2022   Rotator cuff arthropathy, left 09/03/2022   Screening for lung cancer 09/03/2022   Early satiety 02/22/2018   S/P trigger finger release right thumb 11/141/9 02/08/2018   Nausea without vomiting 11/12/2016   PCP: Dr. Christel Mormon REFERRING PROVIDER: Dr. Fuller Canada  ONSET DATE: 02/17/23  REFERRING DIAG: Z61.096 (ICD-10-CM) - Trigger finger, right middle finger   THERAPY DIAG:  Pain in right finger(s)  Other symptoms and signs involving the musculoskeletal system  Rationale for Evaluation and Treatment: Rehabilitation  SUBJECTIVE:   SUBJECTIVE STATEMENT: S: "I added padding to the splint it was hurting me."  PERTINENT HISTORY: Pt is a 78 y/o female s/p right 3rd digit trigger finger release on 02/17/23.  Pt reports extension lag and inability to achieve full flexion.   PRECAUTIONS: Other: ROM and progress as tolerated  WEIGHT BEARING RESTRICTIONS: No  PAIN:  Are you having pain? No  FALLS: Has patient fallen in last 6 months? Yes. Number of falls 1  PLOF: Independent  PATIENT GOALS: To be able to use her right hand functionally.   NEXT MD VISIT: 05/15/23  OBJECTIVE:  Note: Objective measures were completed at Evaluation unless otherwise noted.  HAND DOMINANCE: Right  ADLs: Pt reports difficulty gripping and holding objects. Cannot fully  extend or flex her middle finger. Pt has difficulty opening jar lids or caps. Pt has pain at incision site when holding objects or pushing on things.   FUNCTIONAL OUTCOME MEASURES: Quick Dash: 25 05/27/23: 56.82  UPPER EXTREMITY ROM:     Active ROM Right eval Right 05/27/23  Index MCP (0-90) 66   Index PIP (0-100) 58   Index DIP (0-70)  46   Long MCP (0-90)  72 80  Long PIP (0-100)  68 75  Long DIP (0-70)  42 50  Ring MCP (0-90) 76    Ring PIP (0-100)  90   Ring DIP (0-70)  46   Little MCP (0-90)  86   Little PIP (0-100)  78   Little DIP (0-70)  58   (Blank rows = not tested)   HAND FUNCTION: Grip strength: Right: 30 lbs; Left: 48 lbs, Lateral pinch: Right: 10 lbs, Left: 11 lbs, and 3 point pinch: Right: 8 lbs, Left: 10 lbs ** will reassess once stitches are out** Grip strength: Right:  lbs; Lateral pinch: Right:  lbs, and 3 point pinch:  Right:  lbs  COORDINATION: 9 Hole Peg test: Right: 25.21" sec; Left: 25.86" sec  SENSATION: WFL  EDEMA:  Right MCPs  Left MCPs 7.25cm  7.25cm  COGNITION: Overall cognitive status: Within functional limits for tasks assessed  OBSERVATIONS: edema and scar tissue at incision site at volar base of 2nd and 3rd digit MCPs   TREATMENT DATE:   06/19/23 -Manual Therapy: myofascial release to palmar aspect of hand in order to reduce fascial restrictions and pain, as well as improve ROM -P/ROM: digit extension to flexion, abduction, x10 -Digit ROM: composite flexion, abduction, finger taps, opposition, x10 -Theraputty: rolling into pancake and cutting "cookies" into putty with R hand using PVC pipe, 3 tip pinch across putty log 20x  -Pinch strength: 3 tip pinch placing green and blue clips onto tree   06/10/23 -Manual Therapy: myofascial release to palmar aspect of hand in order to reduce fascial restrictions and pain, as well as improve ROM -P/ROM: digit extension to flexion, abduction, x10 -Digit ROM: composite flexion, abduction, finger  taps, opposition, x10 -Fabrication of digit extension splint - extending from tip of middle finger to the wrist on dorsal aspect of hand.  06/04/23 -Manual Therapy: myofascial release to palmar aspect of hand in order to reduce fascial restrictions and pain, as well as improve ROM -P/ROM: digit extension to flexion, abduction, x10 -Digit ROM: composite flexion, abduction, finger taps, opposition, x10 -Fabrication of digit extension splint   PATIENT EDUCATION: Education details: Splint wear and care Person educated: Patient Education method: Programmer, multimedia, Demonstration, and Handouts Education comprehension: verbalized understanding and returned demonstration  HOME EXERCISE PROGRAM: Eval: finger A/ROM 2/5: Towel scrunches and wringing out 2/26: theraputty grip and pinch, red  GOALS: Goals reviewed with patient? Yes  SHORT TERM GOALS: Target date: 04/29/23  Pt will be provided with and educated on HEP to improve mobility in right hand required for use as dominant during ADL tasks.  Goal status: IN PROGRESS   LONG TERM GOALS: Target date: 05/27/23  Pt will increase A/ROM of right index and middle fingers by 10 degrees to improve ability to make a full fist required for grasping items during ADLs.   Goal status: IN PROGRESS  2.  Pt will decrease pain in right hand to 2/10 or less to improve ability to use the right hand during housekeeping tasks.  Goal status: IN PROGRESS  3.  Pt will decrease right hand scar and fascial restrictions to minimal amounts to improve mobility in hand required for manipulating small objects.    Goal status: IN PROGRESS  4.  Pt will increase right hand grip strength by 15# and pinch strength by 3# to improve ability to grip and open jars and container lids.   Goal status: IN PROGRESS    ASSESSMENT:  CLINICAL IMPRESSION: Pt reports that she added some "cushions" to her splint because it was bothering her at the sides of her wrist- she did not  bring the splint into appt today. She demonstrated good movement with third digit. Continued with grip strength. Pt reports some sensitivities with her scar while holding PVC pipe- discussed how different textures can cause sensitivities. Next session to complete re evaluation.   PERFORMANCE DEFICITS: in functional skills including ADLs, IADLs, coordination, dexterity, ROM, strength, pain, fascial restrictions, Fine motor control, and UE functional use   PLAN:  OT FREQUENCY: 1x/week  OT DURATION: 6 weeks  PLANNED INTERVENTIONS: 97168 OT Re-evaluation, 97535 self care/ADL training, 16109 therapeutic exercise, 97530 therapeutic activity, 97140 manual therapy, [id number]  ultrasound, 16109 paraffin, 97014 electrical stimulation unattended, patient/family education, and DME and/or AE instructions  CONSULTED AND AGREED WITH PLAN OF CARE: Patient  PLAN FOR NEXT SESSION: Follow up on HEP, manual techniques, A/ROM, gentle grip/pinch strengthening   Lurena Joiner, OTR/L [phone] 06/19/2023, 10:03 AM

## 2023-06-24 ENCOUNTER — Encounter (HOSPITAL_COMMUNITY): Payer: Self-pay | Admitting: Occupational Therapy

## 2023-06-24 ENCOUNTER — Ambulatory Visit (HOSPITAL_COMMUNITY): Payer: Self-pay | Admitting: Occupational Therapy

## 2023-06-24 DIAGNOSIS — R29898 Other symptoms and signs involving the musculoskeletal system: Secondary | ICD-10-CM

## 2023-06-24 DIAGNOSIS — M79644 Pain in right finger(s): Secondary | ICD-10-CM

## 2023-06-24 DIAGNOSIS — M6281 Muscle weakness (generalized): Secondary | ICD-10-CM | POA: Diagnosis not present

## 2023-06-24 DIAGNOSIS — Z79899 Other long term (current) drug therapy: Secondary | ICD-10-CM | POA: Diagnosis not present

## 2023-06-24 DIAGNOSIS — E119 Type 2 diabetes mellitus without complications: Secondary | ICD-10-CM | POA: Diagnosis not present

## 2023-06-24 DIAGNOSIS — F17210 Nicotine dependence, cigarettes, uncomplicated: Secondary | ICD-10-CM | POA: Diagnosis not present

## 2023-06-24 DIAGNOSIS — E559 Vitamin D deficiency, unspecified: Secondary | ICD-10-CM | POA: Diagnosis not present

## 2023-06-24 DIAGNOSIS — Z9181 History of falling: Secondary | ICD-10-CM | POA: Diagnosis not present

## 2023-06-24 DIAGNOSIS — M539 Dorsopathy, unspecified: Secondary | ICD-10-CM | POA: Diagnosis not present

## 2023-06-24 DIAGNOSIS — M25612 Stiffness of left shoulder, not elsewhere classified: Secondary | ICD-10-CM | POA: Diagnosis not present

## 2023-06-24 NOTE — Therapy (Signed)
 OUTPATIENT OCCUPATIONAL THERAPY ORTHO TREATMENT NOTE REASSESSMENT & DISCHARGE  Patient Name: Leah Lynch MRN: [id number] DOB:06/14/1945, 78 y.o., female Today's Date: 06/24/2023  OCCUPATIONAL THERAPY DISCHARGE SUMMARY  Visits from Start of Care: 10  Current functional level related to goals / functional outcomes: See below. Pt is using the right hand functionally, has full ROM.    Remaining deficits: Tenderness, decreased strength   Education / Equipment: HEP   Patient agrees to discharge. Patient goals were partially met. Patient is being discharged due to meeting the stated rehab goals..     END OF SESSION:  OT End of Session - 06/24/23 1522     Visit Number 10    Number of Visits 10    Date for OT Re-Evaluation 06/26/23    Authorization Type 1) [employer] Medicare Dual Complete  2) [place] Medicaid    Authorization Time Period no visit limit    Progress Note Due on Visit 10    OT Start Time 1437    OT Stop Time 1515    OT Time Calculation (min) 38 min    Activity Tolerance Patient tolerated treatment well    Behavior During Therapy WFL for tasks assessed/performed             Past Medical History:  Diagnosis Date   Fibrocystic breast    GERD (gastroesophageal reflux disease)    Hyperlipidemia    Hypertension    Osteoarthritis    Osteoporosis    PONV (postoperative nausea and vomiting)    Pre-diabetes    Prediabetes    Shortness of breath dyspnea    Past Surgical History:  Procedure Laterality Date   ABDOMINAL HYSTERECTOMY     BIOPSY  04/15/2018   Procedure: BIOPSY;  Surgeon: Corbin Ade, MD;  [facility];  Service: Endoscopy;;  colon   BREAST CYST EXCISION Right    BREAST SURGERY     removal of cyst from right breast-fibrocystsic   BUNIONECTOMY  12/19/2010   Procedure: BUNIONECTOMY;  Surgeon: Dallas Schimke;  Location: [facility];  Service: Orthopedics;  Laterality: Right;  [name], [name] Bunionectomy   BUNIONECTOMY   05/22/2011   Procedure: Arbutus Leas;  Surgeon: Dallas Schimke, DPM;  Location: [facility];  Service: Orthopedics;  Laterality: Left;  [name] Bunionectomy Left Foot   CAPSULOTOMY  01/29/2012   Procedure: CAPSULOTOMY;  Surgeon: Dallas Schimke, DPM;  Location: [facility];  Service: Orthopedics;  Laterality: Left;   CATARACT EXTRACTION W/PHACO Right 08/09/2015   Procedure: CATARACT EXTRACTION PHACO AND INTRAOCULAR LENS PLACEMENT RIGHT EYE CDE=7.95;  Surgeon: Gemma Payor, MD;  Location: [facility];  Service: Ophthalmology;  Laterality: Right;   CATARACT EXTRACTION W/PHACO Left 08/27/2015   Procedure: CATARACT EXTRACTION PHACO AND INTRAOCULAR LENS PLACEMENT LEFT EYE; CDE:  6.12;  Surgeon: Gemma Payor, MD;  Location: [facility];  Service: Ophthalmology;  Laterality: Left;   COLONOSCOPY  2012   Dr. Jena Gauss: internal hemorrhoids, hyperplastic polyps. Surveillance 2022   COLONOSCOPY WITH PROPOFOL N/A 04/15/2018   one 7 mm polyp at splenic flexure, one 4 mm polyp in rectum, otherwise normal. S/p segmental biopsy:normal. Normal TI   DIAGNOSTIC LAPAROSCOPY     ESOPHAGOGASTRODUODENOSCOPY (EGD) WITH PROPOFOL N/A 04/15/2018   normal   FOOT GANGLION EXCISION     right foot   METATARSAL OSTEOTOMY  05/22/2011   Procedure: METATARSAL OSTEOTOMY;  Surgeon: Dallas Schimke, DPM;  [facility]: [facility];  Service: Orthopedics;  Laterality: Left;  [name] Osteotomy Left Foot   POLYPECTOMY  04/15/2018   Procedure: POLYPECTOMY;  Surgeon: Corbin Ade, MD;  [facility];  Service: Endoscopy;;  colon   REVERSE SHOULDER ARTHROPLASTY Left 09/08/2022   Procedure: LEFT REVERSE SHOULDER ARTHROPLASTY;  Surgeon: Oliver Barre, MD;  Location: [facility];  Service: Orthopedics;  Laterality: Left;  RNFA NEEDED   TONSILLECTOMY  age 71   TRIGGER FINGER RELEASE Right 01/28/2018   Procedure: RIGHT TRIGGER THUMB RELEASE;  Surgeon: Vickki Hearing, MD;  [facility]: [facility];  Service: Orthopedics;  Laterality: Right;   TRIGGER  FINGER RELEASE Right 02/17/2023   Procedure: RELEASE TRIGGER FINGER/A-1 PULLEY;  Surgeon: Vickki Hearing, MD;  Location: [facility];  Service: Orthopedics;  Laterality: Right;   TRIGGER FINGER RELEASE Right 05/20/2023   Procedure: RELEASE TRIGGER FINGER/A-1 PULLEY RIGHT MIDDLE;  Surgeon: Vickki Hearing, MD;  Location: [facility];  Service: Orthopedics;  Laterality: Right;   TUBAL LIGATION     Patient Active Problem List   Diagnosis Date Noted   Breast cancer screening by mammogram 05/29/2023   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 05/21/2023   Need for pneumococcal 20-valent conjugate vaccination 02/19/2023   Trigger finger, right middle finger 02/17/2023   Need for influenza vaccination 11/18/2022   Controlled type 2 diabetes mellitus without complication, without long-term current use of insulin (HCC) 09/30/2022   Rotator cuff arthropathy of left shoulder 09/08/2022   Essential hypertension 09/03/2022   GERD (gastroesophageal reflux disease) 09/03/2022   Hyperlipidemia 09/03/2022   Osteoporosis 09/03/2022   Chronic musculoskeletal pain 09/03/2022   Rotator cuff arthropathy, left 09/03/2022   Screening for lung cancer 09/03/2022   Early satiety 02/22/2018   S/P trigger finger release right thumb 11/141/9 02/08/2018   Nausea without vomiting 11/12/2016   PCP: Dr. Christel Mormon REFERRING PROVIDER: Dr. Fuller Canada  ONSET DATE: 02/17/23  REFERRING DIAG: Z61.096 (ICD-10-CM) - Trigger finger, right middle finger   THERAPY DIAG:  Pain in right finger(s)  Other symptoms and signs involving the musculoskeletal system  Rationale for Evaluation and Treatment: Rehabilitation  SUBJECTIVE:   SUBJECTIVE STATEMENT: S: "I added padding to the splint it was hurting me."  PERTINENT HISTORY: Pt is a 78 y/o female s/p right 3rd digit trigger finger release on 02/17/23.  Pt reports extension lag and inability to achieve full flexion.   PRECAUTIONS: Other: ROM and  progress as tolerated  WEIGHT BEARING RESTRICTIONS: No  PAIN:  Are you having pain? No  FALLS: Has patient fallen in last 6 months? Yes. Number of falls 1  PLOF: Independent  PATIENT GOALS: To be able to use her right hand functionally.   NEXT MD VISIT: 05/15/23  OBJECTIVE:  Note: Objective measures were completed at Evaluation unless otherwise noted.  HAND DOMINANCE: Right  ADLs: Pt reports difficulty gripping and holding objects. Cannot fully extend or flex her middle finger. Pt has difficulty opening jar lids or caps. Pt has pain at incision site when holding objects or pushing on things.   FUNCTIONAL OUTCOME MEASURES: Quick Dash: 25 05/27/23: 56.82 06/24/23: 11.36  UPPER EXTREMITY ROM:     Active ROM Right eval Right 05/27/23 Right 06/24/23  Index MCP (0-90) 66    Index PIP (0-100) 58    Index DIP (0-70)  46    Long MCP (0-90)  72 80 80  Long PIP (0-100)  68 75 70  Long DIP (0-70)  42 50 50  Ring MCP (0-90) 76     Ring PIP (0-100)  90    Ring DIP (0-70)  46    Little MCP (0-90)  86    Little PIP (0-100)  78    Little DIP (0-70)  58    (Blank rows = not tested)   HAND FUNCTION: Grip strength: Right: 30 lbs; Left: 48 lbs, Lateral pinch: Right: 10 lbs, Left: 11 lbs, and 3 point pinch: Right: 8 lbs, Left: 10 lbs 06/24/23: Grip: Right: 35; Lateral pinch: right 9; 3 point pinch: right 8  COORDINATION: 9 Hole Peg test: Right: 25.21" sec; Left: 25.86" sec  SENSATION: WFL  EDEMA:  Right MCPs  Left MCPs 7.25cm  7.25cm  COGNITION: Overall cognitive status: Within functional limits for tasks assessed  OBSERVATIONS: edema and scar tissue at incision site at volar base of 2nd and 3rd digit MCPs   TREATMENT DATE:  06/24/23 -Splint adjustment: rolled edges of wrist stabilizers, added padding to MCP and wrist areas -Hand gripper: medium beads at 29#, gripper vertical  06/19/23 -Manual Therapy: myofascial release to palmar aspect of hand in order to reduce fascial  restrictions and pain, as well as improve ROM -P/ROM: digit extension to flexion, abduction, x10 -Digit ROM: composite flexion, abduction, finger taps, opposition, x10 -Theraputty: rolling into pancake and cutting "cookies" into putty with R hand using PVC pipe, 3 tip pinch across putty log 20x  -Pinch strength: 3 tip pinch placing green and blue clips onto tree   06/10/23 -Manual Therapy: myofascial release to palmar aspect of hand in order to reduce fascial restrictions and pain, as well as improve ROM -P/ROM: digit extension to flexion, abduction, x10 -Digit ROM: composite flexion, abduction, finger taps, opposition, x10 -Fabrication of digit extension splint - extending from tip of middle finger to the wrist on dorsal aspect of hand.     PATIENT EDUCATION: Education details: reviewed HEP, splint wear Person educated: Patient Education method: Explanation, Demonstration, and Handouts Education comprehension: verbalized understanding and returned demonstration  HOME EXERCISE PROGRAM: Eval: finger A/ROM 2/5: Towel scrunches and wringing out 2/26: theraputty grip and pinch, red  GOALS: Goals reviewed with patient? Yes  SHORT TERM GOALS: Target date: 04/29/23  Pt will be provided with and educated on HEP to improve mobility in right hand required for use as dominant during ADL tasks.  Goal status: MET   LONG TERM GOALS: Target date: 05/27/23  Pt will increase A/ROM of right index and middle fingers by 10 degrees to improve ability to make a full fist required for grasping items during ADLs.   Goal status: PARTIALLY MET  2.  Pt will decrease pain in right hand to 2/10 or less to improve ability to use the right hand during housekeeping tasks.  Goal status: MET  3.  Pt will decrease right hand scar and fascial restrictions to minimal amounts to improve mobility in hand required for manipulating small objects.    Goal status: MET  4.  Pt will increase right hand grip  strength by 15# and pinch strength by 3# to improve ability to grip and open jars and container lids.   Goal status: NOT MET    ASSESSMENT:  CLINICAL IMPRESSION: Reassessment completed this session, pt has met 3/5 goals with improved ROM and functional use of the right hand. Pt continues have tenderness and decreased grip strength. Pt is able to use the right hand functionally for all ADLs and does not have continual pain. Discussed splint wear, pt reports she is wearing at night and does have improved extension in the mornings,  increased flexion during the day. Discussed impact of possible scar tissue and restrictions on extension, however function is good so a slight lag is ok if not impeding function. Pt is agreeable to discharge with HEP, reviewed with pt. Also adjusted splint for improved comfort. No further OT services required at this time.    PERFORMANCE DEFICITS: in functional skills including ADLs, IADLs, coordination, dexterity, ROM, strength, pain, fascial restrictions, Fine motor control, and UE functional use   PLAN:  OT FREQUENCY: 1x/week  OT DURATION: 6 weeks  PLANNED INTERVENTIONS: 97168 OT Re-evaluation, 97535 self care/ADL training, 16109 therapeutic exercise, 97530 therapeutic activity, 97140 manual therapy, 97035 ultrasound, 97018 paraffin, 60454 electrical stimulation unattended, patient/family education, and DME and/or AE instructions  CONSULTED AND AGREED WITH PLAN OF CARE: Patient  PLAN FOR NEXT SESSION: discharge today   Ezra Sites, OTR/L  [phone] 06/24/2023, 3:24 PM

## 2023-07-06 ENCOUNTER — Ambulatory Visit: Admitting: Internal Medicine

## 2023-07-09 ENCOUNTER — Ambulatory Visit: Admitting: Internal Medicine

## 2023-07-16 ENCOUNTER — Encounter: Payer: Self-pay | Admitting: Orthopedic Surgery

## 2023-07-16 ENCOUNTER — Ambulatory Visit: Admitting: Orthopedic Surgery

## 2023-07-16 DIAGNOSIS — M65331 Trigger finger, right middle finger: Secondary | ICD-10-CM

## 2023-07-16 DIAGNOSIS — M24541 Contracture, right hand: Secondary | ICD-10-CM

## 2023-07-16 NOTE — Patient Instructions (Signed)
 Continue scar massage hopefully the scar will loosen over time

## 2023-07-16 NOTE — Progress Notes (Signed)
 Follow up visit for right long or middle finger   S/p repeat trigger finger release   Encounter Diagnoses  Name Primary?   Contracture of finger joint, right Yes   Trigger finger, right middle finger s/p release 05/20/23     DOS 05/20/23   Leah Lynch still having some tenderness over the palmar aspect of her hand, it presents her from opening certain lids.  She still has a about a 10 degree extension deficit flexion is normal.  There is no catching or locking.  The palmar area shows contracture and scarring  Recommend continue scar massage hopefully the scar will mature and it will loosen up

## 2023-07-16 NOTE — Progress Notes (Signed)
   There were no vitals taken for this visit.  There is no height or weight on file to calculate BMI.  Chief Complaint  Patient presents with   Post-op Follow-up    05/20/23 right middle finger trigger release     No diagnosis found.  DOI/DOS/ Date: 05/20/23.  Unchanged

## 2023-07-16 NOTE — Progress Notes (Signed)
 Leah Lynch

## 2023-07-22 ENCOUNTER — Other Ambulatory Visit: Payer: Self-pay | Admitting: Internal Medicine

## 2023-07-22 DIAGNOSIS — E1169 Type 2 diabetes mellitus with other specified complication: Secondary | ICD-10-CM | POA: Diagnosis not present

## 2023-07-22 DIAGNOSIS — Z780 Asymptomatic menopausal state: Secondary | ICD-10-CM | POA: Diagnosis not present

## 2023-07-22 DIAGNOSIS — Z9181 History of falling: Secondary | ICD-10-CM | POA: Diagnosis not present

## 2023-07-22 DIAGNOSIS — Z79899 Other long term (current) drug therapy: Secondary | ICD-10-CM | POA: Diagnosis not present

## 2023-07-22 DIAGNOSIS — Z760 Encounter for issue of repeat prescription: Secondary | ICD-10-CM | POA: Diagnosis not present

## 2023-07-22 DIAGNOSIS — M539 Dorsopathy, unspecified: Secondary | ICD-10-CM | POA: Diagnosis not present

## 2023-07-22 DIAGNOSIS — E559 Vitamin D deficiency, unspecified: Secondary | ICD-10-CM | POA: Diagnosis not present

## 2023-07-22 DIAGNOSIS — F17210 Nicotine dependence, cigarettes, uncomplicated: Secondary | ICD-10-CM | POA: Diagnosis not present

## 2023-07-22 DIAGNOSIS — Z87891 Personal history of nicotine dependence: Secondary | ICD-10-CM | POA: Diagnosis not present

## 2023-07-30 ENCOUNTER — Other Ambulatory Visit (HOSPITAL_COMMUNITY): Payer: Self-pay | Admitting: Nurse Practitioner

## 2023-07-30 DIAGNOSIS — Z122 Encounter for screening for malignant neoplasm of respiratory organs: Secondary | ICD-10-CM

## 2023-08-19 DIAGNOSIS — F17210 Nicotine dependence, cigarettes, uncomplicated: Secondary | ICD-10-CM | POA: Diagnosis not present

## 2023-08-19 DIAGNOSIS — E1169 Type 2 diabetes mellitus with other specified complication: Secondary | ICD-10-CM | POA: Diagnosis not present

## 2023-08-19 DIAGNOSIS — M539 Dorsopathy, unspecified: Secondary | ICD-10-CM | POA: Diagnosis not present

## 2023-08-19 DIAGNOSIS — Z9181 History of falling: Secondary | ICD-10-CM | POA: Diagnosis not present

## 2023-08-19 DIAGNOSIS — Z79899 Other long term (current) drug therapy: Secondary | ICD-10-CM | POA: Diagnosis not present

## 2023-08-19 DIAGNOSIS — E559 Vitamin D deficiency, unspecified: Secondary | ICD-10-CM | POA: Diagnosis not present

## 2023-08-24 ENCOUNTER — Ambulatory Visit (HOSPITAL_COMMUNITY)
Admission: RE | Admit: 2023-08-24 | Discharge: 2023-08-24 | Disposition: A | Discharge status: Home / Self Care | Source: Ambulatory Visit | Attending: Internal Medicine | Admitting: Internal Medicine

## 2023-08-24 DIAGNOSIS — Z1231 Encounter for screening mammogram for malignant neoplasm of breast: Secondary | ICD-10-CM

## 2023-08-28 ENCOUNTER — Ambulatory Visit: Payer: Self-pay | Admitting: Internal Medicine

## 2023-08-28 ENCOUNTER — Encounter: Payer: Self-pay | Admitting: Internal Medicine

## 2023-08-28 VITALS — BP 139/77 | HR 79 | Ht 59.0 in | Wt 142.2 lb

## 2023-08-28 DIAGNOSIS — E782 Mixed hyperlipidemia: Secondary | ICD-10-CM | POA: Diagnosis not present

## 2023-08-28 DIAGNOSIS — E119 Type 2 diabetes mellitus without complications: Secondary | ICD-10-CM

## 2023-08-28 DIAGNOSIS — I10 Essential (primary) hypertension: Secondary | ICD-10-CM

## 2023-08-28 DIAGNOSIS — K219 Gastro-esophageal reflux disease without esophagitis: Secondary | ICD-10-CM | POA: Diagnosis not present

## 2023-08-28 NOTE — Assessment & Plan Note (Signed)
 Diet controlled.  A1c 6.5 on labs from March.

## 2023-08-28 NOTE — Assessment & Plan Note (Signed)
Symptoms are well controlled with omeprazole.

## 2023-08-28 NOTE — Assessment & Plan Note (Signed)
 Remains adequately controlled on current antihypertensive regimen.

## 2023-08-28 NOTE — Patient Instructions (Addendum)
 It was a pleasure to see you today.  Thank you for giving [place] the opportunity to be involved in your care.  Below is a brief recap of your visit and next steps.  We will plan to see you again in 3 months.  Summary No medication changes today Follow up in 3 months

## 2023-08-28 NOTE — Assessment & Plan Note (Signed)
 Lipid panel updated in December reflects excellent control with atorvastatin  80 mg daily.

## 2023-08-28 NOTE — Progress Notes (Signed)
 Established Patient Office Visit  Subjective   Patient ID: Leah Lynch, female    DOB: 1945-11-09  Age: 78 y.o. MRN: [id number]  Chief Complaint  Patient presents with   Care Management    Follow up    Ms. [name] returns to care today for routine follow-up.  She was last evaluated by me on 3/14.  No medication changes were made at that time, repeat labs ordered, and 83-month follow-up arranged for reassessment.  She has been evaluated by orthopedic surgery on multiple occasions in the interim and has attended PT/OT.  There have otherwise been no acute interval events.  Today she reports feeling well and has no acute concerns to discuss.  Past Medical History:  Diagnosis Date   Fibrocystic breast    GERD (gastroesophageal reflux disease)    Hyperlipidemia    Hypertension    Osteoarthritis    Osteoporosis    PONV (postoperative nausea and vomiting)    Pre-diabetes    Prediabetes    Shortness of breath dyspnea    Past Surgical History:  Procedure Laterality Date   ABDOMINAL HYSTERECTOMY     BIOPSY  04/15/2018   Procedure: BIOPSY;  Surgeon: Suzette Espy, MD;  [facility];  Service: Endoscopy;;  colon   BREAST CYST EXCISION Right    BREAST SURGERY     removal of cyst from right breast-fibrocystsic   BUNIONECTOMY  12/19/2010   Procedure: BUNIONECTOMY;  Surgeon: Dewayne Ford;  [facility]: [facility];  Service: Orthopedics;  Laterality: Right;  Claudine Cullens, [name] Bunionectomy   BUNIONECTOMY  05/22/2011   Procedure: Tillman Folks;  Surgeon: Dewayne Ford, DPM;  Location: [facility];  Service: Orthopedics;  Laterality: Left;  [name] Bunionectomy Left Foot   CAPSULOTOMY  01/29/2012   Procedure: CAPSULOTOMY;  Surgeon: Dewayne Ford, DPM;  [facility];  Service: Orthopedics;  Laterality: Left;   CATARACT EXTRACTION W/PHACO Right 08/09/2015   Procedure: CATARACT EXTRACTION PHACO AND INTRAOCULAR LENS PLACEMENT RIGHT EYE CDE=7.95;   Surgeon: Anner Kill, MD;  [facility];  Service: Ophthalmology;  Laterality: Right;   CATARACT EXTRACTION W/PHACO Left 08/27/2015   Procedure: CATARACT EXTRACTION PHACO AND INTRAOCULAR LENS PLACEMENT LEFT EYE; CDE:  6.12;  Surgeon: Anner Kill, MD;  Location: [facility];  Service: Ophthalmology;  Laterality: Left;   COLONOSCOPY  2012   Dr. Riley Cheadle: internal hemorrhoids, hyperplastic polyps. Surveillance 2022   COLONOSCOPY WITH PROPOFOL  N/A 04/15/2018   one 7 mm polyp at splenic flexure, one 4 mm polyp in rectum, otherwise normal. S/p segmental biopsy:normal. Normal TI   DIAGNOSTIC LAPAROSCOPY     ESOPHAGOGASTRODUODENOSCOPY (EGD) WITH PROPOFOL  N/A 04/15/2018   normal   FOOT GANGLION EXCISION     right foot   METATARSAL OSTEOTOMY  05/22/2011   Procedure: METATARSAL OSTEOTOMY;  Surgeon: Dewayne Ford, DPM;  [facility];  Service: Orthopedics;  Laterality: Left;  [name] Osteotomy Left Foot   POLYPECTOMY  04/15/2018   Procedure: POLYPECTOMY;  Surgeon: Suzette Espy, MD;  [facility];  Service: Endoscopy;;  colon   REVERSE SHOULDER ARTHROPLASTY Left 09/08/2022   Procedure: LEFT REVERSE SHOULDER ARTHROPLASTY;  Surgeon: Tonita Frater, MD;  [facility];  Service: Orthopedics;  Laterality: Left;  RNFA NEEDED   TONSILLECTOMY  age 45   TRIGGER FINGER RELEASE Right 01/28/2018   Procedure: RIGHT TRIGGER THUMB RELEASE;  Surgeon: Darrin Emerald, MD;  [facility];  Service: Orthopedics;  Laterality: Right;   TRIGGER FINGER RELEASE  Right 02/17/2023   Procedure: RELEASE TRIGGER FINGER/A-1 PULLEY;  Surgeon: Darrin Emerald, MD;  Location: [facility];  Service: Orthopedics;  Laterality: Right;   TRIGGER FINGER RELEASE Right 05/20/2023   Procedure: RELEASE TRIGGER FINGER/A-1 PULLEY RIGHT MIDDLE;  Surgeon: Darrin Emerald, MD;  Location: [facility];  Service: Orthopedics;  Laterality: Right;   TUBAL LIGATION     Social History   Tobacco Use   Smoking status: Every Day     Current packs/day: 0.00    Average packs/day: 1.5 packs/day for 47.4 years (71.0 ttl pk-yrs)    Types: Cigarettes    Start date: 03/18/1975    Last attempt to quit: 07/27/2022    Years since quitting: 1.0   Smokeless tobacco: Never  Vaping Use   Vaping status: Never Used  Substance Use Topics   Alcohol use: No   Drug use: No   Family History  Problem Relation Age of Onset   Anesthesia problems Neg Hx    Hypotension Neg Hx    Malignant hyperthermia Neg Hx    Pseudochol deficiency Neg Hx    Colon cancer Neg Hx    Allergies  Allergen Reactions   Bee Venom Swelling   Penicillins Hives, Itching and Other (See Comments)    Has patient had a PCN reaction causing immediate rash, facial/tongue/throat swelling, SOB or lightheadedness with hypotension: Yes Has patient had a PCN reaction causing severe rash involving mucus membranes or skin necrosis: No Has patient had a PCN reaction that required hospitalization No Has patient had a PCN reaction occurring within the last 10 years: YES If all of the above answers are NO, then may proceed with Cephalosporin use.    Latex Itching   Sulfa Antibiotics Itching   Review of Systems  Constitutional:  Negative for chills and fever.  HENT:  Negative for sore throat.   Respiratory:  Negative for cough and shortness of breath.   Cardiovascular:  Negative for chest pain, palpitations and leg swelling.  Gastrointestinal:  Negative for abdominal pain, blood in stool, constipation, diarrhea, nausea and vomiting.  Genitourinary:  Negative for dysuria and hematuria.  Musculoskeletal:  Negative for myalgias.  Skin:  Negative for itching and rash.  Neurological:  Negative for dizziness and headaches.  Psychiatric/Behavioral:  Negative for depression and suicidal ideas.      Objective:     BP 139/77   Pulse 79   Ht 4' 11 (1.499 m)   Wt 142 lb 3.2 oz (64.5 kg)   SpO2 92%   BMI 28.72 kg/m  BP Readings from Last 3 Encounters:  08/28/23 139/77   05/29/23 132/62  05/20/23 124/69   Physical Exam Vitals reviewed.  Constitutional:      General: She is not in acute distress.    Appearance: Normal appearance. She is not toxic-appearing.  HENT:     Head: Normocephalic and atraumatic.     Right Ear: External ear normal.     Left Ear: External ear normal.     Nose: Nose normal. No congestion or rhinorrhea.     Mouth/Throat:     Mouth: Mucous membranes are moist.     Pharynx: Oropharynx is clear. No oropharyngeal exudate or posterior oropharyngeal erythema.   Eyes:     General: No scleral icterus.    Extraocular Movements: Extraocular movements intact.     Conjunctiva/sclera: Conjunctivae normal.     Pupils: Pupils are equal, round, and reactive to light.    Cardiovascular:     Rate and  Rhythm: Normal rate and regular rhythm.     Pulses: Normal pulses.     Heart sounds: Normal heart sounds. No murmur heard.    No friction rub. No gallop.  Pulmonary:     Effort: Pulmonary effort is normal.     Breath sounds: Normal breath sounds. No wheezing, rhonchi or rales.  Abdominal:     General: Abdomen is flat. Bowel sounds are normal. There is no distension.     Palpations: Abdomen is soft.     Tenderness: There is no abdominal tenderness.   Musculoskeletal:        General: No swelling. Normal range of motion.     Cervical back: Normal range of motion.     Right lower leg: No edema.     Left lower leg: No edema.  Lymphadenopathy:     Cervical: No cervical adenopathy.   Skin:    General: Skin is warm and dry.     Capillary Refill: Capillary refill takes less than 2 seconds.     Coloration: Skin is not jaundiced.   Neurological:     General: No focal deficit present.     Mental Status: She is alert and oriented to person, place, and time.   Psychiatric:        Mood and Affect: Mood normal.        Behavior: Behavior normal.   Last CBC Lab Results  Component Value Date   WBC 9.5 05/18/2023   HGB 12.8 05/18/2023   HCT  41.1 05/18/2023   MCV 89.7 05/18/2023   MCH 27.9 05/18/2023   RDW 15.9 (H) 05/18/2023   PLT 287 05/18/2023   Last metabolic panel Lab Results  Component Value Date   GLUCOSE 94 05/18/2023   NA 139 05/18/2023   K 4.0 05/18/2023   CL 101 05/18/2023   CO2 28 05/18/2023   BUN 26 (H) 05/18/2023   CREATININE 0.81 05/18/2023   GFRNONAA >60 05/18/2023   CALCIUM  9.6 05/18/2023   PROT 6.6 08/27/2022   ALBUMIN 4.3 08/27/2022   LABGLOB 2.3 08/27/2022   AGRATIO 1.9 08/27/2022   BILITOT 0.2 08/27/2022   ALKPHOS 125 (H) 08/27/2022   AST 26 08/27/2022   ALT 23 08/27/2022   ANIONGAP 10 05/18/2023   Last lipids Lab Results  Component Value Date   CHOL 156 02/18/2023   HDL 75 02/18/2023   LDLCALC 65 02/18/2023   TRIG 88 02/18/2023   CHOLHDL 2.1 02/18/2023   Last hemoglobin A1c Lab Results  Component Value Date   HGBA1C 6.5 (H) 05/18/2023   Last thyroid  functions Lab Results  Component Value Date   TSH 5.080 (H) 08/27/2022   Last vitamin D  Lab Results  Component Value Date   VD25OH 34.2 08/27/2022   Last vitamin B12 and Folate Lab Results  Component Value Date   VITAMINB12 852 08/27/2022   FOLATE 16.5 08/27/2022   The 10-year ASCVD risk score (Arnett DK, et al., 2019) is: 66.8%    Assessment & Plan:   Problem List Items Addressed This Visit       Essential hypertension - Primary   Remains adequately controlled on current antihypertensive regimen.      GERD (gastroesophageal reflux disease)   Symptoms are well-controlled with omeprazole       Controlled type 2 diabetes mellitus without complication, without long-term current use of insulin (HCC)   Diet controlled.  A1c 6.5 on labs from March.      Hyperlipidemia   Lipid panel updated in December  reflects excellent control with atorvastatin  80 mg daily.       Return in about 3 months (around 11/28/2023).    Tobi Fortes, MD

## 2023-09-02 ENCOUNTER — Ambulatory Visit: Admitting: Orthopedic Surgery

## 2023-09-13 ENCOUNTER — Other Ambulatory Visit: Payer: Self-pay | Admitting: Internal Medicine

## 2023-09-17 DIAGNOSIS — M539 Dorsopathy, unspecified: Secondary | ICD-10-CM | POA: Diagnosis not present

## 2023-09-17 DIAGNOSIS — E559 Vitamin D deficiency, unspecified: Secondary | ICD-10-CM | POA: Diagnosis not present

## 2023-09-17 DIAGNOSIS — F17210 Nicotine dependence, cigarettes, uncomplicated: Secondary | ICD-10-CM | POA: Diagnosis not present

## 2023-09-17 DIAGNOSIS — Z9181 History of falling: Secondary | ICD-10-CM | POA: Diagnosis not present

## 2023-09-17 DIAGNOSIS — E1169 Type 2 diabetes mellitus with other specified complication: Secondary | ICD-10-CM | POA: Diagnosis not present

## 2023-09-17 DIAGNOSIS — Z79899 Other long term (current) drug therapy: Secondary | ICD-10-CM | POA: Diagnosis not present

## 2023-09-21 DIAGNOSIS — Z79899 Other long term (current) drug therapy: Secondary | ICD-10-CM | POA: Diagnosis not present

## 2023-09-29 ENCOUNTER — Other Ambulatory Visit (INDEPENDENT_AMBULATORY_CARE_PROVIDER_SITE_OTHER): Payer: Self-pay

## 2023-09-29 ENCOUNTER — Telehealth: Payer: Self-pay

## 2023-09-29 ENCOUNTER — Ambulatory Visit: Admitting: Orthopedic Surgery

## 2023-09-29 ENCOUNTER — Encounter: Payer: Self-pay | Admitting: Orthopedic Surgery

## 2023-09-29 DIAGNOSIS — M12812 Other specific arthropathies, not elsewhere classified, left shoulder: Secondary | ICD-10-CM | POA: Diagnosis not present

## 2023-09-29 NOTE — Progress Notes (Signed)
 Orthopaedic Postop Note  Assessment: Leah Lynch is a 78 y.o. female s/p Left Reverse Shoulder Arthroplasty  DOS: 09/08/2022  Plan: Leah Lynch has done well, but has some residual stiffness in the left shoulder.  She continues to work on exercises.  Pain is much better.  She is pleased with her improvements.  She can do most things around her house, but does have some fatigue which is secondary to some back issues.  Provided reassurance.  I think she has done well.  If she has any further issues, she will contact the clinic.  Follow-up: Return if symptoms worsen or fail to improve.  XR at next visit: Left shoulder  Subjective:  Chief Complaint  Patient presents with   Left shoulder follow-up    Reverse shoulder arthroplasty 09/08/2022    History of Present Illness: Leah Lynch is a 78 y.o. female who presents following the above stated procedure.  Surgery was a little over a year ago.  She has recovered well.  She has occasional aches and pains.  She does note some stiffness.  She is able to do most things around the house, but has some limitations in motion.  She has been doing exercises on a regular basis at home.  She is not taking medicines for her shoulder on a regular basis.   Review of Systems: No fevers or chills No numbness or tingling No Chest Pain No shortness of breath   Objective: There were no vitals taken for this visit.  Physical Exam:  Alert and oriented.  No acute distress.  Surgical incision is healed.  No surrounding erythema or drainage.  Mild numbness distal to the incision.  110 degrees of abduction at her side.  150 degrees of active forward elevation.  Limited external rotation at her side.  Internal rotation to her back pocket.   IMAGING: I personally ordered and reviewed the following images:  X-rays of the left shoulder were obtained in clinic today.  These are compared to available x-rays.  Left reverse shoulder arthroplasty  in unchanged position.  No evidence of subsidence.  No periprosthetic lucency.  No acute fractures.  Shoulder is reduced.  No change in alignment.  Impression: Stable left reverse shoulder arthroplasty  Oneil DELENA Horde, MD 09/29/2023 9:21 AM

## 2023-09-29 NOTE — Telephone Encounter (Unsigned)
 Copied from CRM [id number]. Topic: Clinical - Medication Refill >> Sep 29, 2023 12:41 PM Silvana PARAS wrote: Medication: potassium chloride  (KLOR-CON ) 10 MEQ CR tablet  Has the patient contacted their pharmacy? Yes (Agent: If no, request that the patient contact the pharmacy for the refill. If patient does not wish to contact the pharmacy document the reason why and proceed with request.) (Agent: If yes, when and what did the pharmacy advise?)  This is the patient's preferred pharmacy:  [facility] - [place], [place] - [address] [place] [address] Phone: [phone] Fax: [phone]  Is this the correct pharmacy for this prescription? Yes If no, delete pharmacy and type the correct one.   Has the prescription been filled recently? No  Is the patient out of the medication? No  Has the patient been seen for an appointment in the last year OR does the patient have an upcoming appointment? Yes  Can we respond through MyChart? Yes  Agent: Please be advised that Rx refills may take up to 3 business days. We ask that you follow-up with your pharmacy.

## 2023-09-30 ENCOUNTER — Other Ambulatory Visit: Payer: Self-pay

## 2023-09-30 DIAGNOSIS — I10 Essential (primary) hypertension: Secondary | ICD-10-CM

## 2023-09-30 MED ORDER — POTASSIUM CHLORIDE ER 10 MEQ PO TBCR: 10.0000 meq | EXTENDED_RELEASE_TABLET | Freq: Two times a day (BID) | ORAL | 5 refills | Status: DC

## 2023-10-13 ENCOUNTER — Ambulatory Visit: Attending: Cardiology | Admitting: Cardiology

## 2023-10-13 ENCOUNTER — Encounter: Payer: Self-pay | Admitting: Cardiology

## 2023-10-13 VITALS — BP 138/72 | HR 72 | Ht 59.0 in | Wt 138.4 lb

## 2023-10-13 DIAGNOSIS — I10 Essential (primary) hypertension: Secondary | ICD-10-CM

## 2023-10-13 DIAGNOSIS — R600 Localized edema: Secondary | ICD-10-CM

## 2023-10-13 NOTE — Patient Instructions (Addendum)
 Medication Instructions:   Continue all current medications.   Labwork:  none  Testing/Procedures:  Your physician has requested that you have an echocardiogram. Echocardiography is a painless test that uses sound waves to create images of your heart. It provides your doctor with information about the size and shape of your heart and how well your heart's chambers and valves are working. This procedure takes approximately one hour. There are no restrictions for this procedure. Please do NOT wear cologne, perfume, aftershave, or lotions (deodorant is allowed). Please arrive 15 minutes prior to your appointment time.  Please note: We ask at that you not bring children with you during ultrasound (echo/ vascular) testing. Due to room size and safety concerns, children are not allowed in the ultrasound rooms during exams. Our front office staff cannot provide observation of children in our lobby area while testing is being conducted. An adult accompanying a patient to their appointment will only be allowed in the ultrasound room at the discretion of the ultrasound technician under special circumstances. We apologize for any inconvenience. Office will contact with results via phone, letter or mychart.     Follow-Up:  Pending   Any Other Special Instructions Will Be Listed Below (If Applicable).   If you need a refill on your cardiac medications before your next appointment, please call your pharmacy.

## 2023-10-13 NOTE — Progress Notes (Signed)
 Clinical Summary Ms. Hem is a 78 y.o.female seen today as a new patient for the following medical problems.  1.Leg edema - started few years ago, had been on diuretic initially with improvement - fluid not working as well as when initially started - no SOB/DOE though sedentary, primarily limited by arthritis. No orthopnea/PND - has lasix  prn, roughly takes just a few times a month      Past Medical History:  Diagnosis Date   Fibrocystic breast    GERD (gastroesophageal reflux disease)    Hyperlipidemia    Hypertension    Osteoarthritis    Osteoporosis    PONV (postoperative nausea and vomiting)    Pre-diabetes    Prediabetes    Shortness of breath dyspnea      Allergies  Allergen Reactions   Bee Venom Swelling   Penicillins Hives, Itching and Other (See Comments)    Has patient had a PCN reaction causing immediate rash, facial/tongue/throat swelling, SOB or lightheadedness with hypotension: Yes Has patient had a PCN reaction causing severe rash involving mucus membranes or skin necrosis: No Has patient had a PCN reaction that required hospitalization No Has patient had a PCN reaction occurring within the last 10 years: YES If all of the above answers are NO, then may proceed with Cephalosporin use.    Latex Itching   Sulfa Antibiotics Itching     Current Outpatient Medications  Medication Sig Dispense Refill   albuterol  (VENTOLIN  HFA) 108 (90 Base) MCG/ACT inhaler Inhale 2 puffs into the lungs every 6 (six) hours as needed for wheezing or shortness of breath. 18 g 0   atenolol  (TENORMIN ) 25 MG tablet Take 1 tablet (25 mg total) by mouth daily. 90 tablet 3   atorvastatin  (LIPITOR) 80 MG tablet Take 1 tablet (80 mg total) by mouth daily. 90 tablet 3   chlorthalidone  (HYGROTON ) 25 MG tablet Take 1 tablet (25 mg total) by mouth daily. 90 tablet 3   furosemide  (LASIX ) 40 MG tablet Take 40 mg by mouth daily.     gabapentin  (NEURONTIN ) 300 MG capsule Take 1  capsule (300 mg total) by mouth 3 (three) times daily as needed (pain). 90 capsule 3   HYDROcodone -acetaminophen  (NORCO) 10-325 MG tablet Take 1 tablet by mouth 4 (four) times daily as needed for severe pain (pain score 7-10).     lisinopril  (ZESTRIL ) 2.5 MG tablet Take 1 tablet (2.5 mg total) by mouth daily. 90 tablet 3   Multiple Vitamin (MULTIVITAMIN) tablet Take 1 tablet by mouth daily. Centrum silver woman+     naproxen  (NAPROSYN ) 500 MG tablet Take 1 tablet (500 mg total) by mouth daily as needed for moderate pain (pain score 4-6). 90 tablet 1   nitroGLYCERIN  (NITROSTAT ) 0.4 MG SL tablet Place 0.4 mg under the tongue every 5 (five) minutes as needed for chest pain.     omeprazole  (PRILOSEC) 20 MG capsule Take 1 capsule (20 mg total) by mouth daily. 90 capsule 3   ondansetron  (ZOFRAN ) 8 MG tablet Take 1 tablet (8 mg total) by mouth every 8 (eight) hours as needed for nausea or vomiting. 20 tablet 0   potassium chloride  (KLOR-CON  10) 10 MEQ tablet Take 1 tablet (10 mEq total) by mouth 2 (two) times daily. 60 tablet 5   No current facility-administered medications for this visit.     Past Surgical History:  Procedure Laterality Date   ABDOMINAL HYSTERECTOMY     BIOPSY  04/15/2018   Procedure: BIOPSY;  Surgeon: Shaaron Lamar HERO, MD;  Location: [facility];  Service: Endoscopy;;  colon   BREAST CYST EXCISION Right    BREAST SURGERY     removal of cyst from right breast-fibrocystsic   BUNIONECTOMY  12/19/2010   Procedure: BUNIONECTOMY;  Surgeon: Morene Donley Anon;  Location: [facility];  Service: Orthopedics;  Laterality: Right;  Massie Edwards, [name] Bunionectomy   BUNIONECTOMY  05/22/2011   Procedure: EDWARDS;  Surgeon: Morene Donley Anon, DPM;  Location: [facility];  Service: Orthopedics;  Laterality: Left;  [name] Bunionectomy Left Foot   CAPSULOTOMY  01/29/2012   Procedure: CAPSULOTOMY;  Surgeon: Morene Donley Anon, DPM;  Location: [facility];  Service: Orthopedics;   Laterality: Left;   CATARACT EXTRACTION W/PHACO Right 08/09/2015   Procedure: CATARACT EXTRACTION PHACO AND INTRAOCULAR LENS PLACEMENT RIGHT EYE CDE=7.95;  Surgeon: Cherene Mania, MD;  Location: [facility];  Service: Ophthalmology;  Laterality: Right;   CATARACT EXTRACTION W/PHACO Left 08/27/2015   Procedure: CATARACT EXTRACTION PHACO AND INTRAOCULAR LENS PLACEMENT LEFT EYE; CDE:  6.12;  Surgeon: Cherene Mania, MD;  Location: [facility];  Service: Ophthalmology;  Laterality: Left;   COLONOSCOPY  2012   Dr. Shaaron: internal hemorrhoids, hyperplastic polyps. Surveillance 2022   COLONOSCOPY WITH PROPOFOL  N/A 04/15/2018   one 7 mm polyp at splenic flexure, one 4 mm polyp in rectum, otherwise normal. S/p segmental biopsy:normal. Normal TI   DIAGNOSTIC LAPAROSCOPY     ESOPHAGOGASTRODUODENOSCOPY (EGD) WITH PROPOFOL  N/A 04/15/2018   normal   FOOT GANGLION EXCISION     right foot   METATARSAL OSTEOTOMY  05/22/2011   Procedure: METATARSAL OSTEOTOMY;  Surgeon: Morene Donley Anon, DPM;  Location: [facility];  Service: Orthopedics;  Laterality: Left;  [name] Osteotomy Left Foot   POLYPECTOMY  04/15/2018   Procedure: POLYPECTOMY;  Surgeon: Shaaron Lamar HERO, MD;  [facility];  Service: Endoscopy;;  colon   REVERSE SHOULDER ARTHROPLASTY Left 09/08/2022   Procedure: LEFT REVERSE SHOULDER ARTHROPLASTY;  Surgeon: Onesimo Oneil LABOR, MD;  Location: [facility];  Service: Orthopedics;  Laterality: Left;  RNFA NEEDED   TONSILLECTOMY  age 24   TRIGGER FINGER RELEASE Right 01/28/2018   Procedure: RIGHT TRIGGER THUMB RELEASE;  Surgeon: Margrette Taft BRAVO, MD;  Location: [facility];  Service: Orthopedics;  Laterality: Right;   TRIGGER FINGER RELEASE Right 02/17/2023   Procedure: RELEASE TRIGGER FINGER/A-1 PULLEY;  Surgeon: Margrette Taft BRAVO, MD;  Location: [facility];  Service: Orthopedics;  Laterality: Right;   TRIGGER FINGER RELEASE Right 05/20/2023   Procedure: RELEASE TRIGGER FINGER/A-1 PULLEY RIGHT MIDDLE;  Surgeon: Margrette Taft BRAVO, MD;  Location: [facility];  Service: Orthopedics;  Laterality: Right;   TUBAL LIGATION       Allergies  Allergen Reactions   Bee Venom Swelling   Penicillins Hives, Itching and Other (See Comments)    Has patient had a PCN reaction causing immediate rash, facial/tongue/throat swelling, SOB or lightheadedness with hypotension: Yes Has patient had a PCN reaction causing severe rash involving mucus membranes or skin necrosis: No Has patient had a PCN reaction that required hospitalization No Has patient had a PCN reaction occurring within the last 10 years: YES If all of the above answers are NO, then may proceed with Cephalosporin use.    Latex Itching   Sulfa Antibiotics Itching      Family History  Problem Relation Age of Onset   Anesthesia problems Neg Hx    Hypotension Neg Hx    Malignant hyperthermia Neg Hx  Pseudochol deficiency Neg Hx    Colon cancer Neg Hx      Social History Ms. Suleiman reports that she has been smoking cigarettes. She started smoking about 48 years ago. She has a 71 pack-year smoking history. She has never used smokeless tobacco. Ms. Wanamaker reports no history of alcohol use.   Physical Examination Today's Vitals   10/13/23 1038  BP: 138/72  Pulse: 72  SpO2: 95%  Weight: 138 lb 6.4 oz (62.8 kg)  Height: 4' 11 (1.499 m)   Body mass index is 27.95 kg/m.  Gen: resting comfortably, no acute distress HEENT: no scleral icterus, pupils equal round and reactive, no palptable cervical adenopathy,  CV: RRR, no m/rg, no jvd Resp: Clear to auscultation bilaterally GI: abdomen is soft, non-tender, non-distended, normal bowel sounds, no hepatosplenomegaly MSK: extremities are warm, 1+ bilateral LE edema Skin: warm, no rash Neuro:  no focal deficits Psych: appropriate affect     Assessment and Plan  1.Leg edema - progressing edema,using lasix  more regularly and when she takes it will resolve her edema - limited SOB/DOE though very  sedentary, mainly limited due to arthritis - would plan for echo to evaluate for any underlying cardiac dysfunction - EKG today shows NSR   F/u pending echo results.       Dorn PHEBE Ross, M.D.

## 2023-10-13 NOTE — Addendum Note (Signed)
 Addended by: LEIGH JON MATSU on: 10/13/2023 11:24 AM   Modules accepted: Orders

## 2023-10-15 DIAGNOSIS — F17210 Nicotine dependence, cigarettes, uncomplicated: Secondary | ICD-10-CM | POA: Diagnosis not present

## 2023-10-15 DIAGNOSIS — M539 Dorsopathy, unspecified: Secondary | ICD-10-CM | POA: Diagnosis not present

## 2023-10-15 DIAGNOSIS — Z79899 Other long term (current) drug therapy: Secondary | ICD-10-CM | POA: Diagnosis not present

## 2023-10-15 DIAGNOSIS — E1169 Type 2 diabetes mellitus with other specified complication: Secondary | ICD-10-CM | POA: Diagnosis not present

## 2023-10-19 DIAGNOSIS — Z79899 Other long term (current) drug therapy: Secondary | ICD-10-CM | POA: Diagnosis not present

## 2023-11-02 ENCOUNTER — Ambulatory Visit: Attending: Cardiology

## 2023-11-02 DIAGNOSIS — R600 Localized edema: Secondary | ICD-10-CM | POA: Diagnosis not present

## 2023-11-03 LAB — ECHOCARDIOGRAM COMPLETE
AR max vel: 3.41 cm2
AV Area VTI: 2.85 cm2
AV Area mean vel: 2.94 cm2
AV Mean grad: 4 mmHg
AV Peak grad: 6.9 mmHg
Ao pk vel: 1.31 m/s
Area-P 1/2: 3.36 cm2
Calc EF: 62.4 %
MV VTI: 2.47 cm2
S' Lateral: 2.8 cm
Single Plane A2C EF: 62.1 %
Single Plane A4C EF: 63 %

## 2023-11-06 ENCOUNTER — Encounter: Payer: Self-pay | Admitting: Radiology

## 2023-11-12 DIAGNOSIS — F17210 Nicotine dependence, cigarettes, uncomplicated: Secondary | ICD-10-CM | POA: Diagnosis not present

## 2023-11-12 DIAGNOSIS — Z79899 Other long term (current) drug therapy: Secondary | ICD-10-CM | POA: Diagnosis not present

## 2023-11-12 DIAGNOSIS — M539 Dorsopathy, unspecified: Secondary | ICD-10-CM | POA: Diagnosis not present

## 2023-11-12 DIAGNOSIS — E1169 Type 2 diabetes mellitus with other specified complication: Secondary | ICD-10-CM | POA: Diagnosis not present

## 2023-11-12 DIAGNOSIS — Z9181 History of falling: Secondary | ICD-10-CM | POA: Diagnosis not present

## 2023-11-12 DIAGNOSIS — E559 Vitamin D deficiency, unspecified: Secondary | ICD-10-CM | POA: Diagnosis not present

## 2023-11-27 ENCOUNTER — Ambulatory Visit

## 2023-11-27 VITALS — BP 103/62 | HR 69 | Ht 59.0 in | Wt 138.1 lb

## 2023-11-27 DIAGNOSIS — Z1159 Encounter for screening for other viral diseases: Secondary | ICD-10-CM

## 2023-11-27 DIAGNOSIS — E782 Mixed hyperlipidemia: Secondary | ICD-10-CM | POA: Diagnosis not present

## 2023-11-27 DIAGNOSIS — G8929 Other chronic pain: Secondary | ICD-10-CM | POA: Diagnosis not present

## 2023-11-27 DIAGNOSIS — E119 Type 2 diabetes mellitus without complications: Secondary | ICD-10-CM | POA: Diagnosis not present

## 2023-11-27 DIAGNOSIS — R110 Nausea: Secondary | ICD-10-CM | POA: Diagnosis not present

## 2023-11-27 DIAGNOSIS — I10 Essential (primary) hypertension: Secondary | ICD-10-CM

## 2023-11-27 DIAGNOSIS — Z91030 Bee allergy status: Secondary | ICD-10-CM

## 2023-11-27 DIAGNOSIS — M7918 Myalgia, other site: Secondary | ICD-10-CM

## 2023-11-27 DIAGNOSIS — Z23 Encounter for immunization: Secondary | ICD-10-CM | POA: Diagnosis not present

## 2023-11-27 MED ORDER — POTASSIUM CHLORIDE ER 10 MEQ PO TBCR: 20.0000 meq | EXTENDED_RELEASE_TABLET | Freq: Two times a day (BID) | ORAL | 5 refills | Status: AC

## 2023-11-27 MED ORDER — EPINEPHRINE 0.3 MG/0.3ML IJ SOAJ: 0.3000 mg | INTRAMUSCULAR | 2 refills | Status: AC | PRN

## 2023-11-27 MED ORDER — ONDANSETRON HCL 8 MG PO TABS: 8.0000 mg | ORAL_TABLET | Freq: Three times a day (TID) | ORAL | 5 refills | Status: AC | PRN

## 2023-11-27 MED ORDER — NAPROXEN 500 MG PO TABS: 500.0000 mg | ORAL_TABLET | Freq: Two times a day (BID) | ORAL | 5 refills | Status: DC

## 2023-11-27 NOTE — Progress Notes (Signed)
 Established Patient Office Visit  Subjective   Patient ID: Leah Lynch, female    DOB: 1945/10/16  Age: 78 y.o. MRN: [id number]  Chief Complaint  Patient presents with   Medical Management of Chronic Issues    Pt here for a follow up    HPI Discussed the use of AI scribe software for clinical note transcription with the patient, who gave verbal consent to proceed.  History of Present Illness   Leah Lynch is a 78 year old female who presents for medication management and follow-up.  Chronic pain and analgesic management - Pain score increased from 4-6 to 8-10 after reduction in naproxen  dosing from 500 mg twice daily to once daily - Currently takes naproxen  500 mg twice daily, one in the morning and one at night - Occasionally uses hydrocodone , taking half a pill as needed and the other half if necessary - Requests consideration of gabapentin  for pain management, previously prescribed by pain management provider  Medication-induced nausea - Experiences nausea, particularly associated with potassium supplementation - Nausea sometimes alleviated by eating food prior to medication administration, but does not always eat before taking pills - Requests ondansetron  (Zofran ) 8 mg for nausea management  Edema and diuretic adjustment - Lasix  dosage reduced from four pills daily to two pills daily - No specific symptoms of fluid overload or dehydration described  Diabetes mellitus - Diabetes managed without medication due to consistently low blood glucose levels - Occasional increased thirst, attributed to blood sugar fluctuations - Scheduled for follow-up every three months per insurance requirements  Immunization request - Requests high-dose influenza vaccine for seniors  History of severe allergic reactions - History of severe reactions to wasp stings with significant swelling requiring emergency care - Requests prescription for EpiPen       Patient  Active Problem List   Diagnosis Date Noted   Allergy to bee sting 11/27/2023   Breast cancer screening by mammogram 05/29/2023   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 05/21/2023   Need for pneumococcal 20-valent conjugate vaccination 02/19/2023   Trigger finger, right middle finger 02/17/2023   Need for influenza vaccination 11/18/2022   Controlled type 2 diabetes mellitus without complication, without long-term current use of insulin (HCC) 09/30/2022   Rotator cuff arthropathy of left shoulder 09/08/2022   Essential hypertension 09/03/2022   GERD (gastroesophageal reflux disease) 09/03/2022   Hyperlipidemia 09/03/2022   Osteoporosis 09/03/2022   Chronic musculoskeletal pain 09/03/2022   Rotator cuff arthropathy, left 09/03/2022   Screening for lung cancer 09/03/2022   Early satiety 02/22/2018   S/P trigger finger release right thumb 11/141/9 02/08/2018   Nausea 11/12/2016    ROS    Objective:     BP 103/62   Pulse 69   Ht 4' 11 (1.499 m)   Wt 138 lb 1.9 oz (62.7 kg)   SpO2 (!) 89%   BMI 27.90 kg/m  BP Readings from Last 3 Encounters:  11/27/23 103/62  10/13/23 138/72  08/28/23 139/77   Wt Readings from Last 3 Encounters:  11/27/23 138 lb 1.9 oz (62.7 kg)  10/13/23 138 lb 6.4 oz (62.8 kg)  08/28/23 142 lb 3.2 oz (64.5 kg)     Physical Exam Vitals and nursing note reviewed.  Constitutional:      Appearance: Normal appearance.  HENT:     Head: Normocephalic.  Eyes:     Extraocular Movements: Extraocular movements intact.     Pupils: Pupils are equal, round, and  reactive to light.  Cardiovascular:     Rate and Rhythm: Normal rate and regular rhythm.  Pulmonary:     Effort: Pulmonary effort is normal.     Breath sounds: Normal breath sounds.  Musculoskeletal:     Cervical back: Normal range of motion and neck supple.  Neurological:     Mental Status: She is alert and oriented to person, place, and time.  Psychiatric:        Mood and  Affect: Mood normal.        Thought Content: Thought content normal.     Results for orders placed or performed in visit on 11/27/23  Basic Metabolic Panel (BMET)  Result Value Ref Range   Glucose 92 70 - 99 mg/dL   BUN 21 8 - 27 mg/dL   Creatinine, Ser 9.00 0.57 - 1.00 mg/dL   eGFR 58 (L) >40 fO/fpw/8.26   BUN/Creatinine Ratio 21 12 - 28   Sodium 144 134 - 144 mmol/L   Potassium 4.4 3.5 - 5.2 mmol/L   Chloride 102 96 - 106 mmol/L   CO2 28 20 - 29 mmol/L   Calcium  9.7 8.7 - 10.3 mg/dL  Lipid Profile  Result Value Ref Range   Cholesterol, Total 146 100 - 199 mg/dL   Triglycerides 865 0 - 149 mg/dL   HDL 58 >60 mg/dL   VLDL Cholesterol Cal 23 5 - 40 mg/dL   LDL Chol Calc (NIH) 65 0 - 99 mg/dL   Chol/HDL Ratio 2.5 0.0 - 4.4 ratio  HgB A1c  Result Value Ref Range   Hgb A1c MFr Bld 6.2 (H) 4.8 - 5.6 %   Est. average glucose Bld gHb Est-mCnc 131 mg/dL  Hepatitis C Antibody  Result Value Ref Range   Hep C Virus Ab Non Reactive Non Reactive  Hepatic function panel  Result Value Ref Range   Total Protein 6.4 6.0 - 8.5 g/dL   Albumin 4.3 3.8 - 4.8 g/dL   Bilirubin Total 0.3 0.0 - 1.2 mg/dL   Bilirubin, Direct 9.85 0.00 - 0.40 mg/dL   Alkaline Phosphatase 148 (H) 44 - 121 IU/L   AST 21 0 - 40 IU/L   ALT 14 0 - 32 IU/L    Last CBC Lab Results  Component Value Date   WBC 9.5 05/18/2023   HGB 12.8 05/18/2023   HCT 41.1 05/18/2023   MCV 89.7 05/18/2023   MCH 27.9 05/18/2023   RDW 15.9 (H) 05/18/2023   PLT 287 05/18/2023   Last metabolic panel Lab Results  Component Value Date   GLUCOSE 92 11/27/2023   NA 144 11/27/2023   K 4.4 11/27/2023   CL 102 11/27/2023   CO2 28 11/27/2023   BUN 21 11/27/2023   CREATININE 0.99 11/27/2023   GFRNONAA >60 05/18/2023   CALCIUM  9.7 11/27/2023   PROT 6.4 11/27/2023   ALBUMIN 4.3 11/27/2023   LABGLOB 2.3 08/27/2022   AGRATIO 1.9 08/27/2022   BILITOT 0.3 11/27/2023   ALKPHOS 148 (H) 11/27/2023   AST 21 11/27/2023   ALT 14  11/27/2023   ANIONGAP 10 05/18/2023   Last lipids Lab Results  Component Value Date   CHOL 146 11/27/2023   HDL 58 11/27/2023   LDLCALC 65 11/27/2023   TRIG 134 11/27/2023   CHOLHDL 2.5 11/27/2023   Last hemoglobin A1c Lab Results  Component Value Date   HGBA1C 6.2 (H) 11/27/2023   Last thyroid  functions Lab Results  Component Value Date   TSH 5.080 (H) 08/27/2022  Last vitamin D  Lab Results  Component Value Date   VD25OH 34.2 08/27/2022   Last vitamin B12 and Folate Lab Results  Component Value Date   VITAMINB12 852 08/27/2022   FOLATE 16.5 08/27/2022      The 10-year ASCVD risk score (Arnett DK, et al., 2019) is: 44.1%    Assessment & Plan:   Problem List Items Addressed This Visit       Cardiovascular and Mediastinum   Essential hypertension   Remains adequately controlled on current antihypertensive regimen.  No medication changes made today.      Relevant Medications   potassium chloride  (KLOR-CON  10) 10 MEQ tablet   EPINEPHrine  (EPIPEN  2-PAK) 0.3 mg/0.3 mL IJ SOAJ injection     Endocrine   Controlled type 2 diabetes mellitus without complication, without long-term current use of insulin (HCC) - Primary   Diet controlled.  A1c 6.5 on labs from March.  Recheck labs today.      Relevant Orders   Basic Metabolic Panel (BMET) (Completed)   HgB A1c (Completed)     Other   Nausea   Nausea secondary to potassium supplementation Severe nausea linked to potassium supplementation. Ondansetron  requested. - Prescribe ondansetron  8 mg for nausea. - Refill potassium supplementation.      Relevant Medications   ondansetron  (ZOFRAN ) 8 MG tablet   Hyperlipidemia   Lipid panel updated today.  She is prescribed atorvastatin  80 mg daily.      Relevant Medications   EPINEPHrine  (EPIPEN  2-PAK) 0.3 mg/0.3 mL IJ SOAJ injection   Other Relevant Orders   Lipid Profile (Completed)   Hepatic function panel (Completed)   Chronic musculoskeletal pain   Pain  score 8-10. Naproxen  more effective at twice daily. Hydrocodone  for breakthrough pain. Gabapentin  managed by specialist. - Adjust naproxen  to 500 mg twice daily. - Continue hydrocodone  as needed for breakthrough pain. - Discuss gabapentin  with pain management specialist.      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 EpiPen  requested for emergencies. - Prescribe EpiPen  for emergency use.      Relevant Medications   EPINEPHrine  (EPIPEN  2-PAK) 0.3 mg/0.3 mL IJ SOAJ injection   Other Visit Diagnoses       Need for hepatitis C screening test       Relevant Orders   Hepatitis C Antibody (Completed)            No follow-ups on file.    Leita Longs, FNP

## 2023-11-28 LAB — HEPATIC FUNCTION PANEL
ALT: 14 IU/L (ref 0–32)
AST: 21 IU/L (ref 0–40)
Albumin: 4.3 g/dL (ref 3.8–4.8)
Alkaline Phosphatase: 148 IU/L — ABNORMAL HIGH (ref 44–121)
Bilirubin Total: 0.3 mg/dL (ref 0.0–1.2)
Bilirubin, Direct: 0.14 mg/dL (ref 0.00–0.40)
Total Protein: 6.4 g/dL (ref 6.0–8.5)

## 2023-11-28 LAB — HEMOGLOBIN A1C
Est. average glucose Bld gHb Est-mCnc: 131 mg/dL
Hgb A1c MFr Bld: 6.2 % — ABNORMAL HIGH (ref 4.8–5.6)

## 2023-11-28 LAB — LIPID PANEL
Chol/HDL Ratio: 2.5 ratio (ref 0.0–4.4)
Cholesterol, Total: 146 mg/dL (ref 100–199)
HDL: 58 mg/dL (ref >39)
LDL Chol Calc (NIH): 65 mg/dL (ref 0–99)
Triglycerides: 134 mg/dL (ref 0–149)
VLDL Cholesterol Cal: 23 mg/dL (ref 5–40)

## 2023-11-28 LAB — BASIC METABOLIC PANEL WITH GFR
BUN/Creatinine Ratio: 21 (ref 12–28)
BUN: 21 mg/dL (ref 8–27)
CO2: 28 mmol/L (ref 20–29)
Calcium: 9.7 mg/dL (ref 8.7–10.3)
Chloride: 102 mmol/L (ref 96–106)
Creatinine, Ser: 0.99 mg/dL (ref 0.57–1.00)
Glucose: 92 mg/dL (ref 70–99)
Potassium: 4.4 mmol/L (ref 3.5–5.2)
Sodium: 144 mmol/L (ref 134–144)
eGFR: 58 mL/min/1.73 — ABNORMAL LOW (ref >59)

## 2023-11-28 LAB — HEPATITIS C ANTIBODY: Hep C Virus Ab: NONREACTIVE

## 2023-12-01 NOTE — Assessment & Plan Note (Signed)
 Diet controlled.  A1c 6.5 on labs from March.  Recheck labs today.

## 2023-12-01 NOTE — Assessment & Plan Note (Signed)
 Nausea secondary to potassium supplementation Severe nausea linked to potassium supplementation. Ondansetron  requested. - Prescribe ondansetron  8 mg for nausea. - Refill potassium supplementation.

## 2023-12-01 NOTE — Assessment & Plan Note (Signed)
 Remains adequately controlled on current antihypertensive regimen.  No medication changes made today.

## 2023-12-01 NOTE — Assessment & Plan Note (Signed)
 Lipid panel updated today.  She is prescribed atorvastatin  80 mg daily.

## 2023-12-01 NOTE — Assessment & Plan Note (Signed)
 Pain score 8-10. Naproxen  more effective at twice daily. Hydrocodone  for breakthrough pain. Gabapentin  managed by specialist. - Adjust naproxen  to 500 mg twice daily. - Continue hydrocodone  as needed for breakthrough pain. - Discuss gabapentin  with pain management specialist.

## 2023-12-01 NOTE — Assessment & Plan Note (Signed)
 Severe allergic reactions to bee stings. EpiPen  requested for emergencies. - Prescribe EpiPen  for emergency use.

## 2023-12-10 DIAGNOSIS — E7800 Pure hypercholesterolemia, unspecified: Secondary | ICD-10-CM | POA: Diagnosis not present

## 2023-12-10 DIAGNOSIS — M539 Dorsopathy, unspecified: Secondary | ICD-10-CM | POA: Diagnosis not present

## 2023-12-10 DIAGNOSIS — Z79899 Other long term (current) drug therapy: Secondary | ICD-10-CM | POA: Diagnosis not present

## 2023-12-10 DIAGNOSIS — F17210 Nicotine dependence, cigarettes, uncomplicated: Secondary | ICD-10-CM | POA: Diagnosis not present

## 2023-12-10 DIAGNOSIS — E1169 Type 2 diabetes mellitus with other specified complication: Secondary | ICD-10-CM | POA: Diagnosis not present

## 2023-12-10 DIAGNOSIS — M129 Arthropathy, unspecified: Secondary | ICD-10-CM | POA: Diagnosis not present

## 2023-12-10 DIAGNOSIS — I10 Essential (primary) hypertension: Secondary | ICD-10-CM | POA: Diagnosis not present

## 2023-12-10 DIAGNOSIS — E559 Vitamin D deficiency, unspecified: Secondary | ICD-10-CM | POA: Diagnosis not present

## 2023-12-10 DIAGNOSIS — Z9181 History of falling: Secondary | ICD-10-CM | POA: Diagnosis not present

## 2023-12-14 ENCOUNTER — Ambulatory Visit: Admitting: Cardiology

## 2023-12-14 DIAGNOSIS — Z79899 Other long term (current) drug therapy: Secondary | ICD-10-CM | POA: Diagnosis not present

## 2023-12-15 ENCOUNTER — Ambulatory Visit: Payer: 59

## 2023-12-15 VITALS — Ht 59.0 in | Wt 140.0 lb

## 2023-12-15 DIAGNOSIS — Z0000 Encounter for general adult medical examination without abnormal findings: Secondary | ICD-10-CM

## 2023-12-15 DIAGNOSIS — Z780 Asymptomatic menopausal state: Secondary | ICD-10-CM | POA: Diagnosis not present

## 2023-12-15 NOTE — Progress Notes (Signed)
 Subjective:   Leah Lynch is a 78 y.o. who presents for a Medicare Wellness preventive visit.  As a reminder, Annual Wellness Visits don't include a physical exam, and some assessments may be limited, especially if this visit is performed virtually. We may recommend an in-person follow-up visit with your provider if needed.  Visit Complete: Virtual I connected with  Leah Lynch on 12/15/23 by a video and audio enabled telemedicine application and verified that I am speaking with the correct person using two identifiers.  Patient Location: Home  Provider Location: Home Office  I discussed the limitations of evaluation and management by telemedicine. The patient expressed understanding and agreed to proceed.  Vital Signs: Because this visit was a virtual/telehealth visit, some criteria may be missing or patient reported. Any vitals not documented were not able to be obtained and vitals that have been documented are patient reported.  Persons Participating in Visit: Patient.  AWV Questionnaire: No: Patient Medicare AWV questionnaire was not completed prior to this visit.  Cardiac Risk Factors include: advanced age (>77men, >27 women);diabetes mellitus;dyslipidemia;hypertension;smoking/ tobacco exposure     Objective:    Today's Vitals   12/15/23 1443 12/15/23 1452  Weight: 140 lb (63.5 kg)   Height: 4' 11 (1.499 m)   PainSc: 10-Worst pain ever 8   PainLoc: Back    Body mass index is 28.28 kg/m.     12/15/2023    2:41 PM 05/20/2023    6:47 AM 04/15/2023    1:40 PM 03/31/2023    8:36 AM 02/17/2023   10:09 AM 02/11/2023    2:25 PM 12/09/2022    3:04 PM  Advanced Directives  Does Patient Have a Medical Advance Directive? Yes No;Yes No No No No No  Type of Estate agent of [place];Living will        Does patient want to make changes to medical advance directive? No - Patient declined Yes (Inpatient - patient defers changing a medical advance  directive at this time - Information given)       Copy of Healthcare Power of Attorney in Chart? Yes - validated most recent copy scanned in chart (See row information)        Would patient like information on creating a medical advance directive?   No - Patient declined No - Patient declined No - Guardian declined No - Patient declined Yes (MAU/Ambulatory/Procedural Areas - Information given)    Current Medications (verified) Outpatient Encounter Medications as of 12/15/2023  Medication Sig   albuterol  (VENTOLIN  HFA) 108 (90 Base) MCG/ACT inhaler Inhale 2 puffs into the lungs every 6 (six) hours as needed for wheezing or shortness of breath.   atenolol  (TENORMIN ) 25 MG tablet Take 1 tablet (25 mg total) by mouth daily.   atorvastatin  (LIPITOR) 80 MG tablet Take 1 tablet (80 mg total) by mouth daily.   chlorthalidone  (HYGROTON ) 25 MG tablet Take 1 tablet (25 mg total) by mouth daily.   EPINEPHrine  (EPIPEN  2-PAK) 0.3 mg/0.3 mL IJ SOAJ injection Inject 0.3 mg into the muscle as needed for anaphylaxis.   furosemide  (LASIX ) 40 MG tablet Take 40 mg by mouth daily.   gabapentin  (NEURONTIN ) 300 MG capsule Take 1 capsule (300 mg total) by mouth 3 (three) times daily as needed (pain).   HYDROcodone -acetaminophen  (NORCO) 10-325 MG tablet Take 1 tablet by mouth 4 (four) times daily as needed for severe pain (pain score 7-10).   lisinopril  (ZESTRIL ) 2.5 MG tablet Take 1 tablet (2.5 mg total)  by mouth daily.   Multiple Vitamin (MULTIVITAMIN) tablet Take 1 tablet by mouth daily. Centrum silver woman+   naproxen  (NAPROSYN ) 500 MG tablet Take 1 tablet (500 mg total) by mouth 2 (two) times daily with a meal.   nitroGLYCERIN  (NITROSTAT ) 0.4 MG SL tablet Place 0.4 mg under the tongue every 5 (five) minutes as needed for chest pain.   omeprazole  (PRILOSEC) 20 MG capsule Take 1 capsule (20 mg total) by mouth daily.   ondansetron  (ZOFRAN ) 8 MG tablet Take 1 tablet (8 mg total) by mouth every 8 (eight) hours as needed  for nausea or vomiting.   potassium chloride  (KLOR-CON  10) 10 MEQ tablet Take 2 tablets (20 mEq total) by mouth 2 (two) times daily.   No facility-administered encounter medications on file as of 12/15/2023.    Allergies (verified) Bee venom, Penicillins, Latex, and Sulfa antibiotics   History: Past Medical History:  Diagnosis Date   Fibrocystic breast    GERD (gastroesophageal reflux disease)    Hyperlipidemia    Hypertension    Osteoarthritis    Osteoporosis    PONV (postoperative nausea and vomiting)    Pre-diabetes    Prediabetes    Shortness of breath dyspnea    Past Surgical History:  Procedure Laterality Date   ABDOMINAL HYSTERECTOMY     BIOPSY  04/15/2018   Procedure: BIOPSY;  Surgeon: Shaaron Lamar HERO, MD;  [facility];  Service: Endoscopy;;  colon   BREAST CYST EXCISION Right    BREAST SURGERY     removal of cyst from right breast-fibrocystsic   BUNIONECTOMY  12/19/2010   Procedure: BUNIONECTOMY;  Surgeon: Morene Donley Anon;  Location: [facility];  Service: Orthopedics;  Laterality: Right;  Massie Edwards, [name] Bunionectomy   BUNIONECTOMY  05/22/2011   Procedure: EDWARDS;  Surgeon: Morene Donley Anon, DPM;  Location: [facility];  Service: Orthopedics;  Laterality: Left;  [name] Bunionectomy Left Foot   CAPSULOTOMY  01/29/2012   Procedure: CAPSULOTOMY;  Surgeon: Morene Donley Anon, DPM;  Location: [facility];  Service: Orthopedics;  Laterality: Left;   CATARACT EXTRACTION W/PHACO Right 08/09/2015   Procedure: CATARACT EXTRACTION PHACO AND INTRAOCULAR LENS PLACEMENT RIGHT EYE CDE=7.95;  Surgeon: Cherene Mania, MD;  Location: [facility];  Service: Ophthalmology;  Laterality: Right;   CATARACT EXTRACTION W/PHACO Left 08/27/2015   Procedure: CATARACT EXTRACTION PHACO AND INTRAOCULAR LENS PLACEMENT LEFT EYE; CDE:  6.12;  Surgeon: Cherene Mania, MD;  Location: [facility];  Service: Ophthalmology;  Laterality: Left;   COLONOSCOPY  2012   Dr. Shaaron: internal  hemorrhoids, hyperplastic polyps. Surveillance 2022   COLONOSCOPY WITH PROPOFOL  N/A 04/15/2018   one 7 mm polyp at splenic flexure, one 4 mm polyp in rectum, otherwise normal. S/p segmental biopsy:normal. Normal TI   DIAGNOSTIC LAPAROSCOPY     ESOPHAGOGASTRODUODENOSCOPY (EGD) WITH PROPOFOL  N/A 04/15/2018   normal   FOOT GANGLION EXCISION     right foot   METATARSAL OSTEOTOMY  05/22/2011   Procedure: METATARSAL OSTEOTOMY;  Surgeon: Morene Donley Anon, DPM;  Location: [facility];  Service: Orthopedics;  Laterality: Left;  [name] Osteotomy Left Foot   POLYPECTOMY  04/15/2018   Procedure: POLYPECTOMY;  Surgeon: Shaaron Lamar HERO, MD;  [facility];  Service: Endoscopy;;  colon   REVERSE SHOULDER ARTHROPLASTY Left 09/08/2022   Procedure: LEFT REVERSE SHOULDER ARTHROPLASTY;  Surgeon: Onesimo Oneil LABOR, MD;  Location: [facility];  Service: Orthopedics;  Laterality: Left;  RNFA NEEDED   TONSILLECTOMY  age 89   TRIGGER FINGER RELEASE  Right 01/28/2018   Procedure: RIGHT TRIGGER THUMB RELEASE;  Surgeon: Margrette Taft BRAVO, MD;  Location: [facility];  Service: Orthopedics;  Laterality: Right;   TRIGGER FINGER RELEASE Right 02/17/2023   Procedure: RELEASE TRIGGER FINGER/A-1 PULLEY;  Surgeon: Margrette Taft BRAVO, MD;  Location: [facility];  Service: Orthopedics;  Laterality: Right;   TRIGGER FINGER RELEASE Right 05/20/2023   Procedure: RELEASE TRIGGER FINGER/A-1 PULLEY RIGHT MIDDLE;  Surgeon: Margrette Taft BRAVO, MD;  Location: [facility];  Service: Orthopedics;  Laterality: Right;   TUBAL LIGATION     Family History  Problem Relation Age of Onset   Anesthesia problems Neg Hx    Hypotension Neg Hx    Malignant hyperthermia Neg Hx    Pseudochol deficiency Neg Hx    Colon cancer Neg Hx    Social History   Socioeconomic History   Marital status: Divorced    Spouse name: Not on file   Number of children: Not on file   Years of education: Not on file   Highest education level: 12th grade  Occupational History    Not on file  Tobacco Use   Smoking status: Every Day    Current packs/day: 0.75    Average packs/day: 1.5 packs/day for 48.7 years (71.8 ttl pk-yrs)    Types: Cigarettes    Start date: 03/18/1975   Smokeless tobacco: Never  Vaping Use   Vaping status: Never Used  Substance and Sexual Activity   Alcohol use: No   Drug use: No   Sexual activity: Yes    Birth control/protection: Post-menopausal  Other Topics Concern   Not on file  Social History Narrative   Not on file   Social Drivers of Health   Financial Resource Strain: Low Risk  (12/15/2023)   Overall Financial Resource Strain (CARDIA)    Difficulty of Paying Living Expenses: Not hard at all  Food Insecurity: No Food Insecurity (12/15/2023)   Hunger Vital Sign    Worried About Running Out of Food in the Last Year: Never true    Ran Out of Food in the Last Year: Never true  Transportation Needs: No Transportation Needs (12/15/2023)   PRAPARE - Administrator, Civil Service (Medical): No    Lack of Transportation (Non-Medical): No  Physical Activity: Inactive (12/15/2023)   Exercise Vital Sign    Days of Exercise per Week: 0 days    Minutes of Exercise per Session: 0 min  Stress: No Stress Concern Present (12/15/2023)   [employer] of Occupational Health - Occupational Stress Questionnaire    Feeling of Stress: Not at all  Social Connections: Patient Declined (12/15/2023)   Social Connection and Isolation Panel    Frequency of Communication with Friends and Family: Patient declined    Frequency of Social Gatherings with Friends and Family: Patient declined    Attends Religious Services: Patient declined    Database administrator or Organizations: Patient declined    Attends Engineer, structural: Patient declined    Marital Status: Patient declined    Tobacco Counseling Ready to quit: No Counseling given: Yes    Clinical Intake:  Pre-visit preparation completed: Yes  Pain : 0-10 Pain  Score: 8  Pain Type: Chronic pain Pain Location: Back Pain Orientation: Lower Pain Descriptors / Indicators: Constant Pain Onset: More than a month ago Pain Frequency: Constant     BMI - recorded: 28.28 Nutritional Status: BMI 25 -29 Overweight Nutritional Risks: None Diabetes: Yes CBG done?: No (telehealth visit)  Did pt. bring in CBG monitor from home?: No  Lab Results  Component Value Date   HGBA1C 6.2 (H) 11/27/2023   HGBA1C 6.5 (H) 05/18/2023   HGBA1C 6.6 (H) 02/11/2023     How often do you need to have someone help you when you read instructions, pamphlets, or other written materials from your doctor or pharmacy?: 1 - Never  Interpreter Needed?: No  Information entered by :: Shenay Torti W CMA (AAMA)   Activities of Daily Living     12/15/2023    3:02 PM 05/20/2023    6:38 AM  In your present state of health, do you have any difficulty performing the following activities:  Hearing? 0 0  Vision? 0 0  Difficulty concentrating or making decisions? 0 0  Walking or climbing stairs? 1   Dressing or bathing? 0   Doing errands, shopping? 0   Preparing Food and eating ? N   Using the Toilet? N   In the past six months, have you accidently leaked urine? N   Do you have problems with loss of bowel control? N   Managing your Medications? N   Managing your Finances? N   Housekeeping or managing your Housekeeping? N     Patient Care Team: Bevely Doffing, FNP as PCP - General (Family Medicine) Shaaron Lamar HERO, MD as Consulting Physician (Gastroenterology) [facility]  Branch, Dorn FALCON, MD as Consulting Physician (Cardiology) Darroll Anes, DO (Optometry) Onesimo Anes LABOR, MD as Consulting Physician (Orthopedic Surgery) Leron Millman, NP as Nurse Practitioner (Pain Medicine) Margrette Taft BRAVO, MD as Consulting Physician (Orthopedic Surgery) Jerrye Lamar HERO Mickey., MD as Referring Physician (Pain Medicine)  I have updated your Care Teams any recent  Medical Services you may have received from other providers in the past year.     Assessment:   This is a routine wellness examination for [place].  Hearing/Vision screen Hearing Screening - Comments:: Patient denies any hearing difficulties.   Vision Screening - Comments:: Patient wears reading glasses only. Up to date with yearly exams.  Dr. Anes Darroll    Goals Addressed               This Visit's Progress     Reduce pain (pt-stated)        Chronic low back pain and chronic bilateral knee pain LT>RT        Depression Screen     12/15/2023    3:07 PM 08/28/2023    8:57 AM 02/18/2023    1:50 PM 12/09/2022    3:19 PM 11/18/2022    1:49 PM 09/30/2022    3:48 PM 08/27/2022    2:22 PM  PHQ 2/9 Scores  PHQ - 2 Score 0 0 0 0 0 0 0  PHQ- 9 Score 0 0  0 0 0 4     Fall Risk     12/15/2023    3:01 PM 08/28/2023    8:57 AM 02/18/2023    1:50 PM 12/09/2022    3:23 PM 11/18/2022    1:49 PM  Fall Risk   Falls in the past year? 0 0 1 0 1  Number falls in past yr: 0 0 0 0 0  Injury with Fall? 0 0 0 0 0  Risk for fall due to : No Fall Risks No Fall Risks Other (Comment) No Fall Risks No Fall Risks  Risk for fall due to: Comment   age    Follow up Falls evaluation completed;Education  provided;Falls prevention discussed Falls evaluation completed;Education provided;Falls prevention discussed Falls evaluation completed Falls prevention discussed Falls evaluation completed    MEDICARE RISK AT HOME:  Medicare Risk at Home Any stairs in or around the home?: Yes If so, are there any without handrails?: No Home free of loose throw rugs in walkways, pet beds, electrical cords, etc?: Yes Adequate lighting in your home to reduce risk of falls?: Yes Life alert?: No Use of a cane, walker or w/c?: No Grab bars in the bathroom?: Yes Shower chair or bench in shower?: Yes Elevated toilet seat or a handicapped toilet?: Yes  TIMED UP AND GO:  Was the test performed?  No  Cognitive  Function: 6CIT completed        12/15/2023    3:02 PM 12/09/2022    3:14 PM  6CIT Screen  What Year? 0 points 0 points  What month? 0 points 0 points  What time? 0 points 0 points  Count back from 20 0 points 0 points  Months in reverse 0 points 0 points  Repeat phrase 0 points 0 points  Total Score 0 points 0 points    Immunizations Immunization History  Administered Date(s) Administered   INFLUENZA, HIGH DOSE SEASONAL PF 11/27/2023   Influenza-Unspecified 11/18/2022   Moderna Covid-19 Fall Seasonal Vaccine 22yrs & older 10/26/2022   PNEUMOCOCCAL CONJUGATE-20 02/18/2023   Pneumococcal Polysaccharide-23 05/19/2011   Respiratory Syncytial Virus Vaccine,Recomb Aduvanted(Arexvy) 04/15/2022   Zoster Recombinant(Shingrix) 06/11/2021, 07/22/2021    Screening Tests Health Maintenance  Topic Date Due   DTaP/Tdap/Td (1 - Tdap) Never done   COVID-19 Vaccine (2 - Moderna risk series) 11/23/2022   DEXA SCAN  08/20/2023   FOOT EXAM  09/30/2023   Lung Cancer Screening  12/03/2023   OPHTHALMOLOGY EXAM  03/02/2024   HEMOGLOBIN A1C  05/26/2024   Diabetic kidney evaluation - Urine ACR  05/28/2024   Mammogram  08/23/2024   Diabetic kidney evaluation - eGFR measurement  11/26/2024   Medicare Annual Wellness (AWV)  12/14/2024   Pneumococcal Vaccine: 50+ Years  Completed   Influenza Vaccine  Completed   Hepatitis C Screening  Completed   Zoster Vaccines- Shingrix  Completed   HPV VACCINES  Aged Out   Meningococcal B Vaccine  Aged Out   Colonoscopy  Discontinued    Health Maintenance Health Maintenance Due  Topic Date Due   DTaP/Tdap/Td (1 - Tdap) Never done   COVID-19 Vaccine (2 - Moderna risk series) 11/23/2022   DEXA SCAN  08/20/2023   FOOT EXAM  09/30/2023   Lung Cancer Screening  12/03/2023   Health Maintenance Items Addressed: DEXA ordered  Additional Screening:  Vision Screening: Recommended annual ophthalmology exams for early detection of glaucoma and other disorders  of the eye. Would you like a referral to an eye doctor? No    Dental Screening: Recommended annual dental exams for proper oral hygiene  Community Resource Referral / Chronic Care Management: CRR required this visit?  No   CCM required this visit?  No   Plan:    I have personally reviewed and noted the following in the patient's chart:   Medical and social history Use of alcohol, tobacco or illicit drugs  Current medications and supplements including opioid prescriptions. Patient is currently taking opioid prescriptions. Information provided to patient regarding non-opioid alternatives. Patient advised to discuss non-opioid treatment plan with their provider. Functional ability and status Nutritional status Physical activity Advanced directives List of other physicians Hospitalizations, surgeries, and ER visits in previous  12 months Vitals Screenings to include cognitive, depression, and falls Referrals and appointments  In addition, I have reviewed and discussed with patient certain preventive protocols, quality metrics, and best practice recommendations. A written personalized care plan for preventive services as well as general preventive health recommendations were provided to patient.   Bryton Romagnoli, CMA   12/15/2023   After Visit Summary: (MyChart) Due to this being a telephonic visit, the after visit summary with patients personalized plan was offered to patient via MyChart   Notes: Nothing significant to report at this time.

## 2023-12-15 NOTE — Patient Instructions (Signed)
 Leah Lynch,  Thank you for taking the time for your Medicare Wellness Visit. I appreciate your continued commitment to your health goals. Please review the care plan we discussed, and feel free to reach out if I can assist you further.  Medicare recommends these wellness visits once per year to help you and your care team stay ahead of potential health issues. These visits are designed to focus on prevention, allowing your provider to concentrate on managing your acute and chronic conditions during your regular appointments.  Please note that Annual Wellness Visits do not include a physical exam. Some assessments may be limited, especially if the visit was conducted virtually. If needed, we may recommend a separate in-person follow-up with your provider.  Ongoing Care  Seeing your primary care provider every 3 to 6 months helps us  monitor your health and provide consistent, personalized care.   Referrals  Osteoporosis Screening An order was placed for you to have your Osteoporosis Screening. Call the number below to schedule that [facility]  [phone]  If you haven't heard from the office in 2 weeks, please call them to schedule your appointment  Recommended Screenings:  Health Maintenance  Topic Date Due   DTaP/Tdap/Td vaccine (1 - Tdap) Never done   COVID-19 Vaccine (2 - Moderna risk series) 11/23/2022   DEXA scan (bone density measurement)  08/20/2023   Complete foot exam   09/30/2023   Screening for Lung Cancer  12/03/2023   Eye exam for diabetics  03/02/2024   Hemoglobin A1C  05/26/2024   Yearly kidney health urinalysis for diabetes  05/28/2024   Breast Cancer Screening  08/23/2024   Yearly kidney function blood test for diabetes  11/26/2024   Medicare Annual Wellness Visit  12/14/2024   Pneumococcal Vaccine for age over 69  Completed   Flu Shot  Completed   Hepatitis C Screening  Completed   Zoster (Shingles) Vaccine  Completed   HPV Vaccine  Aged Out   Meningitis  B Vaccine  Aged Out   Colon Cancer Screening  Discontinued       12/15/2023    2:41 PM  Advanced Directives  Does Patient Have a Medical Advance Directive? Yes  Type of Estate agent of [place];Living will  Does patient want to make changes to medical advance directive? No - Patient declined  Copy of Healthcare Power of Attorney in Chart? Yes - validated most recent copy scanned in chart (See row information)    Advance Care Planning is important because it: Ensures you receive medical care that aligns with your values, goals, and preferences. Provides guidance to your family and loved ones, reducing the emotional burden of decision-making during critical moments.  Vision: Annual vision screenings are recommended for early detection of glaucoma, cataracts, and diabetic retinopathy. These exams can also reveal signs of chronic conditions such as diabetes and high blood pressure.  Dental: Annual dental screenings help detect early signs of oral cancer, gum disease, and other conditions linked to overall health, including heart disease and diabetes.  Please see the attached documents for additional preventive care recommendations.

## 2023-12-16 ENCOUNTER — Other Ambulatory Visit: Payer: Self-pay

## 2023-12-21 ENCOUNTER — Ambulatory Visit: Payer: Self-pay | Admitting: Cardiology

## 2023-12-21 ENCOUNTER — Encounter: Payer: Self-pay | Admitting: *Deleted

## 2023-12-30 ENCOUNTER — Ambulatory Visit (HOSPITAL_COMMUNITY)
Admission: RE | Admit: 2023-12-30 | Discharge: 2023-12-30 | Disposition: A | Discharge status: Home / Self Care | Source: Ambulatory Visit

## 2023-12-30 DIAGNOSIS — Z780 Asymptomatic menopausal state: Secondary | ICD-10-CM | POA: Insufficient documentation

## 2024-01-02 ENCOUNTER — Ambulatory Visit: Payer: Self-pay

## 2024-01-07 DIAGNOSIS — I10 Essential (primary) hypertension: Secondary | ICD-10-CM | POA: Diagnosis not present

## 2024-01-07 DIAGNOSIS — F17210 Nicotine dependence, cigarettes, uncomplicated: Secondary | ICD-10-CM | POA: Diagnosis not present

## 2024-01-07 DIAGNOSIS — E559 Vitamin D deficiency, unspecified: Secondary | ICD-10-CM | POA: Diagnosis not present

## 2024-01-07 DIAGNOSIS — Z9181 History of falling: Secondary | ICD-10-CM | POA: Diagnosis not present

## 2024-01-07 DIAGNOSIS — E1169 Type 2 diabetes mellitus with other specified complication: Secondary | ICD-10-CM | POA: Diagnosis not present

## 2024-01-07 DIAGNOSIS — M539 Dorsopathy, unspecified: Secondary | ICD-10-CM | POA: Diagnosis not present

## 2024-01-07 DIAGNOSIS — M25511 Pain in right shoulder: Secondary | ICD-10-CM | POA: Diagnosis not present

## 2024-01-07 DIAGNOSIS — Z79899 Other long term (current) drug therapy: Secondary | ICD-10-CM | POA: Diagnosis not present

## 2024-01-11 DIAGNOSIS — Z79899 Other long term (current) drug therapy: Secondary | ICD-10-CM | POA: Diagnosis not present

## 2024-01-18 ENCOUNTER — Ambulatory Visit: Payer: Self-pay

## 2024-01-18 ENCOUNTER — Encounter: Payer: Self-pay | Admitting: Radiology

## 2024-01-21 ENCOUNTER — Telehealth: Payer: Self-pay

## 2024-01-21 ENCOUNTER — Other Ambulatory Visit: Payer: Self-pay

## 2024-01-21 DIAGNOSIS — G8929 Other chronic pain: Secondary | ICD-10-CM

## 2024-01-21 DIAGNOSIS — M810 Age-related osteoporosis without current pathological fracture: Secondary | ICD-10-CM

## 2024-01-21 MED ORDER — CELECOXIB 200 MG PO CAPS: 200.0000 mg | ORAL_CAPSULE | Freq: Two times a day (BID) | ORAL | 3 refills | Status: AC

## 2024-01-21 MED ORDER — DENOSUMAB 60 MG/ML ~~LOC~~ SOSY: 60.0000 mg | PREFILLED_SYRINGE | SUBCUTANEOUS | 1 refills | Status: DC

## 2024-01-21 NOTE — Telephone Encounter (Signed)
 I sent currently in to her pharmacy.  They will have to do a prior approval for this.  I also sent in a prescription for Celebrex to take twice a day this is a prescription medication for osteoarthritis.  It is not a narcotic or controlled medication.  This would take the place of her naproxen  or any other NSAIDs like Advil ibuprofen or Aleve .

## 2024-01-21 NOTE — Telephone Encounter (Signed)
 Meredith with [facility] informed

## 2024-01-21 NOTE — Telephone Encounter (Signed)
 Copied from CRM [id number]. Topic: Clinical - Medication Question >> Jan 21, 2024 10:35 AM Delon DASEN wrote: Reason for CRM: Hoy with [facility], need clarification, patient was prescribed Naproxen - a month ago, prescription was received for Celebrex today, asking if Naproxen  is being discontinued - please call [phone]

## 2024-01-21 NOTE — Telephone Encounter (Signed)
 Yes, I advised pt to stop the naproxsyn

## 2024-01-26 ENCOUNTER — Telehealth: Payer: Self-pay

## 2024-01-26 ENCOUNTER — Other Ambulatory Visit (HOSPITAL_COMMUNITY): Payer: Self-pay

## 2024-01-26 ENCOUNTER — Other Ambulatory Visit: Payer: Self-pay

## 2024-01-26 DIAGNOSIS — M810 Age-related osteoporosis without current pathological fracture: Secondary | ICD-10-CM

## 2024-01-26 MED ORDER — JUBBONTI 60 MG/ML ~~LOC~~ SOSY
60.0000 mg | PREFILLED_SYRINGE | SUBCUTANEOUS | 1 refills | Status: AC
Start: 2024-01-26 — End: ?

## 2024-01-26 NOTE — Telephone Encounter (Signed)
 [facility] has a prescription on file for Prolia  for this patient- the patient's insurance now prefers the biosimilar agent Jubbonti. An updated prescription will be required to make this adjustment. No PA is required and patient's copay is $0

## 2024-02-05 ENCOUNTER — Other Ambulatory Visit: Payer: Self-pay

## 2024-03-08 ENCOUNTER — Other Ambulatory Visit (INDEPENDENT_AMBULATORY_CARE_PROVIDER_SITE_OTHER): Payer: Self-pay

## 2024-03-08 ENCOUNTER — Ambulatory Visit: Admitting: Orthopedic Surgery

## 2024-03-08 VITALS — BP 137/83 | HR 71 | Ht 59.0 in | Wt 133.0 lb

## 2024-03-08 DIAGNOSIS — M25511 Pain in right shoulder: Secondary | ICD-10-CM

## 2024-03-08 DIAGNOSIS — M19011 Primary osteoarthritis, right shoulder: Secondary | ICD-10-CM | POA: Diagnosis not present

## 2024-03-08 DIAGNOSIS — G8929 Other chronic pain: Secondary | ICD-10-CM

## 2024-03-08 NOTE — Progress Notes (Signed)
 Orthopaedic Clinic Return  Assessment: Leah Lynch is a 78 y.o. female with the following: Right shoulder pain  Plan: Mrs. Briney has pain in the right shoulder.  No specific injury.  Radiographs demonstrates some signs of arthritis.  This could represent a flare, associated with the arthritis.  She has decent range of motion on exam.  We discussed multiple treatment options.  She is interested in an injection.  This was completed in the clinic today.  She is adamant that she does not want to consider surgery for her right shoulder.   Procedure note injection - Right shoulder    Verbal consent was obtained to inject the right shoulder, subacromial space Timeout was completed to confirm the site of injection.   The skin was prepped with alcohol and ethyl chloride was sprayed at the injection site.  A 21-gauge needle was used to inject 40 mg of Depo-Medrol  and 1% lidocaine  (4 cc) into the subacromial space of the right shoulder using a posterolateral approach.  There were no complications.  A sterile bandage was applied.    Follow-up: Return if symptoms worsen or fail to improve.   Subjective:  Chief Complaint  Patient presents with   Shoulder Pain    R for a while but doesn't want surgery.     History of Present Illness: Leah Lynch is a 78 y.o. female who returns to clinic for evaluation of right shoulder pain.  She is well-known to my clinic.  Her left shoulder is doing well following reverse shoulder arthroplasty.  She states that she has had pain in the right shoulder for a couple months.  No specific injury.  No history of an injury to the right shoulder.  Pain is primarily over the anterior aspect of the shoulder.  Medications have not been effective.  She has not had an injection.  No physical therapy.  Review of Systems: No fevers or chills No numbness or tingling No chest pain No shortness of breath No bowel or bladder dysfunction No GI distress No  headaches   Objective: BP 137/83   Pulse 71   Ht 4' 11 (1.499 m)   Wt 133 lb (60.3 kg)   BMI 26.86 kg/m   Physical Exam:  Alert and oriented.  No acute distress.  Right shoulder without deformity.  No redness.  No swelling.  Tenderness over the anterior shoulder.  Positive Jobes.  Negative belly press.  Positive O'Brien's.  130 degrees of forward flexion.  IMAGING: I personally ordered and reviewed the following images:  X-ray of the right shoulder obtained clinic today.  No acute injuries are noted.  Mild to moderate loss of joint space within the glenohumeral joint.  Small inferior based osteophytes.  No obvious proximal humeral migration.  No bony lesions.  Impression: Right shoulder x-rays without acute injury; mild to moderate degenerative changes   Oneil DELENA Horde, MD 03/08/2024 9:27 AM

## 2024-03-08 NOTE — Patient Instructions (Signed)

## 2024-03-09 ENCOUNTER — Encounter: Payer: Self-pay | Admitting: Orthopedic Surgery

## 2024-03-23 ENCOUNTER — Ambulatory Visit (INDEPENDENT_AMBULATORY_CARE_PROVIDER_SITE_OTHER)

## 2024-03-23 VITALS — BP 136/81 | HR 74 | Ht 59.0 in | Wt 130.0 lb

## 2024-03-23 DIAGNOSIS — E1169 Type 2 diabetes mellitus with other specified complication: Secondary | ICD-10-CM

## 2024-03-23 DIAGNOSIS — M7918 Myalgia, other site: Secondary | ICD-10-CM

## 2024-03-23 DIAGNOSIS — G8929 Other chronic pain: Secondary | ICD-10-CM

## 2024-03-23 NOTE — Progress Notes (Signed)
 "  Established Patient Office Visit  Subjective   Patient ID: Leah Lynch, female    DOB: [birth year]  Age: 79 y.o. MRN: [id number]  Chief Complaint  Patient presents with   Medical Management of Chronic Issues    3 month follow up     HPI Discussed the use of AI scribe software for clinical note transcription with the patient, who gave verbal consent to proceed.  History of Present Illness   Discussed the use of AI scribe software for clinical note transcription with the patient, who gave verbal consent to proceed.  History of Present Illness    Leah Lynch is a 79 year old female with diabetes and arthritis who presents for follow-up after a recent steroid injection and concerns about knee pain and cramping.  Glycemic control and appetite changes - Recent blood sugar level of 6.6, considered slightly elevated, possibly related to holiday dietary changes - Unintentional weight loss of 10-12 pounds over the past month - Decreased appetite over the past month - Currently taking Celebrex  and calcium  with vitamin D3; questions if Celebrex  is affecting appetite  Shoulder pain and management - History of rotator cuff surgery on right shoulder - Arthritis and bone spurs in left shoulder - Received steroid injection in left shoulder; pain reduction noted after several days - Previously used hydrocodone  for pain management, but has recently used less due to limited supply  Knee pain and sleep disturbance - Significant knee pain that awakens her at night and disrupts sleep - Sleeps for approximately two hours before waking due to pain - Pain described as throbbing and burning, subsides after some time allowing her to fall back asleep - Finds relief when lying on stomach with legs stretched out, but unable to sleep in this position due to shoulder issues  Lower extremity cramping and edema - Severe leg cramps that prevent ambulation - No recent swelling in feet or  legs - Ankles appear more defined than previously  Blood pressure concerns - Recent blood pressure reading of 150/30 - No associated symptoms with elevated blood pressure          Patient Active Problem List   Diagnosis Date Noted   Allergy to bee sting 11/27/2023   Breast cancer screening by mammogram 05/29/2023   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 05/21/2023   Need for pneumococcal 20-valent conjugate vaccination 02/19/2023   Trigger finger, right middle finger 02/17/2023   Need for influenza vaccination 11/18/2022   Type 2 diabetes mellitus with other specified complication, without long-term current use of insulin (HCC) 09/30/2022   Rotator cuff arthropathy of left shoulder 09/08/2022   Essential hypertension 09/03/2022   GERD (gastroesophageal reflux disease) 09/03/2022   Hyperlipidemia 09/03/2022   Osteoporosis 09/03/2022   Chronic musculoskeletal pain 09/03/2022   Rotator cuff arthropathy, left 09/03/2022   Screening for lung cancer 09/03/2022   Early satiety 02/22/2018   S/P trigger finger release right thumb 11/141/9 02/08/2018   Nausea 11/12/2016    ROS    Objective:     BP 136/81   Pulse 74   Ht 4' 11 (1.499 m)   Wt 130 lb 0.6 oz (59 kg)   SpO2 90%   BMI 26.26 kg/m  BP Readings from Last 3 Encounters:  03/23/24 136/81  03/08/24 137/83  11/27/23 103/62   Wt Readings from Last 3 Encounters:  03/23/24 130 lb 0.6 oz (59 kg)  03/08/24 133 lb (60.3 kg)  12/15/23  140 lb (63.5 kg)     Physical Exam Vitals and nursing note reviewed.  Constitutional:      Appearance: Normal appearance. She is obese.  HENT:     Head: Normocephalic.     Right Ear: Tympanic membrane, ear canal and external ear normal.     Left Ear: Tympanic membrane, ear canal and external ear normal.     Nose: Nose normal.     Mouth/Throat:     Mouth: Mucous membranes are moist.     Pharynx: Oropharynx is clear.  Eyes:     Extraocular Movements: Extraocular  movements intact.     Conjunctiva/sclera: Conjunctivae normal.     Pupils: Pupils are equal, round, and reactive to light.  Cardiovascular:     Rate and Rhythm: Normal rate and regular rhythm.  Pulmonary:     Effort: Pulmonary effort is normal.     Breath sounds: Normal breath sounds.  Abdominal:     General: Bowel sounds are normal.     Palpations: Abdomen is soft.  Musculoskeletal:        General: Normal range of motion.     Cervical back: Normal range of motion and neck supple.  Skin:    General: Skin is warm and dry.  Neurological:     Mental Status: She is alert and oriented to person, place, and time.  Psychiatric:        Mood and Affect: Mood normal.        Thought Content: Thought content normal.     Diabetic foot exam was performed with the following findings:   No deformities, ulcerations, or other skin breakdown Normal sensation of 10g monofilament Intact posterior tibialis and dorsalis pedis pulses     No results found for any visits on 03/23/24.  Last CBC Lab Results  Component Value Date   WBC 9.5 05/18/2023   HGB 12.8 05/18/2023   HCT 41.1 05/18/2023   MCV 89.7 05/18/2023   MCH 27.9 05/18/2023   RDW 15.9 (H) 05/18/2023   PLT 287 05/18/2023   Last metabolic panel Lab Results  Component Value Date   GLUCOSE 92 11/27/2023   NA 144 11/27/2023   K 4.4 11/27/2023   CL 102 11/27/2023   CO2 28 11/27/2023   BUN 21 11/27/2023   CREATININE 0.99 11/27/2023   EGFR 58 (L) 11/27/2023   CALCIUM  9.7 11/27/2023   PROT 6.4 11/27/2023   ALBUMIN 4.3 11/27/2023   LABGLOB 2.3 08/27/2022   AGRATIO 1.9 08/27/2022   BILITOT 0.3 11/27/2023   ALKPHOS 148 (H) 11/27/2023   AST 21 11/27/2023   ALT 14 11/27/2023   ANIONGAP 10 05/18/2023   Last lipids Lab Results  Component Value Date   CHOL 146 11/27/2023   HDL 58 11/27/2023   LDLCALC 65 11/27/2023   TRIG 134 11/27/2023   CHOLHDL 2.5 11/27/2023   Last hemoglobin A1c Lab Results  Component Value Date    HGBA1C 6.2 (H) 11/27/2023   Last thyroid  functions Lab Results  Component Value Date   TSH 5.080 (H) 08/27/2022   FREET4 0.87 08/27/2022   Last vitamin D  Lab Results  Component Value Date   VD25OH 34.2 08/27/2022   Last vitamin B12 and Folate Lab Results  Component Value Date   VITAMINB12 852 08/27/2022   FOLATE 16.5 08/27/2022     The 10-year ASCVD risk score (Arnett DK, et al., 2019) is: 63.9%    Assessment & Plan:   Problem List Items Addressed This Visit  Endocrine   Type 2 diabetes mellitus with other specified complication, without long-term current use of insulin (HCC)   A1c at 6.6, likely elevated due to recent steroid injection. Reports decreased appetite and weight loss, possibly from Celebrex . - Order lab tests in three months.      Relevant Orders   CMP14+EGFR   HgB A1c   [facility] Diabetes Foot Exam (Completed)   Lipid Profile     Other   Chronic musculoskeletal pain - Primary   Chronic osteoarthritis with recent steroid injection for shoulder pain relief. Significant knee pain, especially at night, affecting sleep. Celebrex  may reduce appetite. - Continue Celebrex  for pain. - Apply topical Voltaren gel on knees before bed. - Consider topical magnesium for cramping and sleep disturbances.       Return in about 4 months (around 07/21/2024) for chronic follow-up with PCP.    Leita Longs, FNP  "

## 2024-03-24 NOTE — Assessment & Plan Note (Signed)
 A1c at 6.6, likely elevated due to recent steroid injection. Reports decreased appetite and weight loss, possibly from Celebrex . - Order lab tests in three months.

## 2024-03-24 NOTE — Assessment & Plan Note (Signed)
 Chronic osteoarthritis with recent steroid injection for shoulder pain relief. Significant knee pain, especially at night, affecting sleep. Celebrex  may reduce appetite. - Continue Celebrex  for pain. - Apply topical Voltaren gel on knees before bed. - Consider topical magnesium for cramping and sleep disturbances.

## 2024-07-22 ENCOUNTER — Ambulatory Visit: Payer: Self-pay

## 2024-12-19 ENCOUNTER — Ambulatory Visit
